# Patient Record
Sex: Male | Born: 1990 | Race: White | Hispanic: No | Smoking: Current every day smoker
Health system: Southern US, Community
[De-identification: ages and names within clinical notes are randomized; demographics above are authoritative.]

## PROBLEM LIST (undated history)

## (undated) DIAGNOSIS — I10 Essential (primary) hypertension: Secondary | ICD-10-CM

## (undated) DIAGNOSIS — M1990 Unspecified osteoarthritis, unspecified site: Secondary | ICD-10-CM

## (undated) DIAGNOSIS — K219 Gastro-esophageal reflux disease without esophagitis: Secondary | ICD-10-CM

## (undated) DIAGNOSIS — A4901 Methicillin susceptible Staphylococcus aureus infection, unspecified site: Secondary | ICD-10-CM

## (undated) DIAGNOSIS — F209 Schizophrenia, unspecified: Secondary | ICD-10-CM

## (undated) DIAGNOSIS — F32A Depression, unspecified: Secondary | ICD-10-CM

## (undated) DIAGNOSIS — R000 Tachycardia, unspecified: Secondary | ICD-10-CM

## (undated) DIAGNOSIS — D649 Anemia, unspecified: Secondary | ICD-10-CM

## (undated) DIAGNOSIS — J45909 Unspecified asthma, uncomplicated: Secondary | ICD-10-CM

## (undated) DIAGNOSIS — F1990 Other psychoactive substance use, unspecified, uncomplicated: Secondary | ICD-10-CM

## (undated) DIAGNOSIS — F319 Bipolar disorder, unspecified: Secondary | ICD-10-CM

## (undated) DIAGNOSIS — F419 Anxiety disorder, unspecified: Secondary | ICD-10-CM

## (undated) DIAGNOSIS — F4310 Post-traumatic stress disorder, unspecified: Secondary | ICD-10-CM

## (undated) DIAGNOSIS — F329 Major depressive disorder, single episode, unspecified: Secondary | ICD-10-CM

## (undated) HISTORY — DX: Essential (primary) hypertension: I10

## (undated) HISTORY — PX: APPENDECTOMY: SHX54

## (undated) HISTORY — PX: COLONOSCOPY: SHX174

---
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
## 2008-09-13 DIAGNOSIS — K559 Vascular disorder of intestine, unspecified: Secondary | ICD-10-CM

## 2008-09-13 HISTORY — DX: Vascular disorder of intestine, unspecified: K55.9

## 2008-10-01 ENCOUNTER — Emergency Department (HOSPITAL_COMMUNITY): Admission: EM | Admit: 2008-10-01 | Discharge: 2008-10-01 | Payer: Self-pay | Admitting: Emergency Medicine

## 2008-10-02 ENCOUNTER — Inpatient Hospital Stay (HOSPITAL_COMMUNITY): Admission: EM | Admit: 2008-10-02 | Discharge: 2008-10-06 | Payer: Self-pay | Admitting: Emergency Medicine

## 2008-10-02 IMAGING — CT CT PELVIS W/ CM
2 of 4 series · 17 of 46 positions shown, 19 images · IV contrast (agent unspecified)
Comparison: None.

CT ABDOMEN

CLINICAL DATA: Generalized abdominal pain for 4 days.

CT ABDOMEN AND PELVIS WITH CONTRAST
TECHNIQUE: Multidetector CT imaging of the abdomen and pelvis was
performed using the standard protocol following bolus
administration of intravenous contrast.
Contrast: 100 ml [FZ].

[Series 2: rtn ap with st · axial · 0.78mm/px · z∈[-504,-68]mm · 14 of 95 slices shown, 16 images]
[im 4/95  soft-tissue]
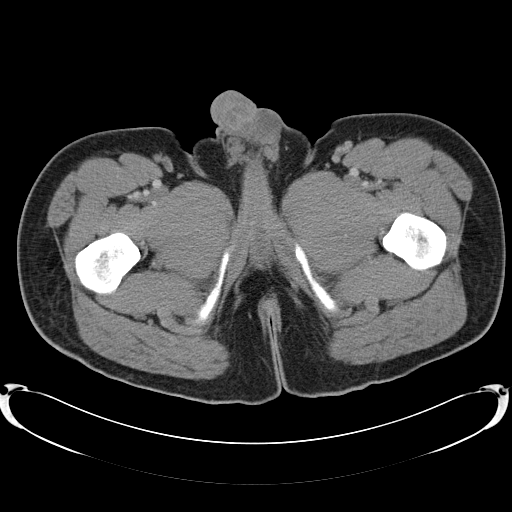
[im 4/95  bone]
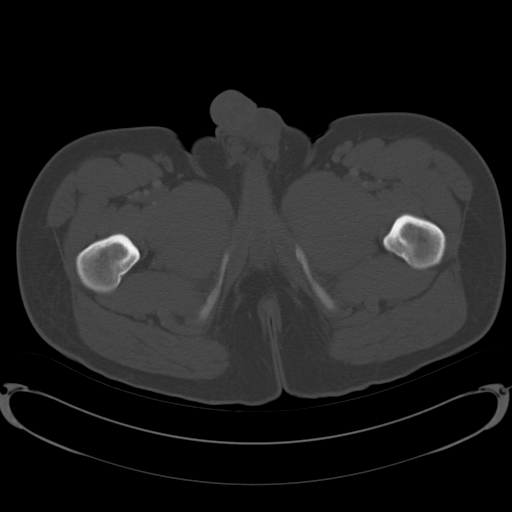
[im 12/95  soft-tissue]
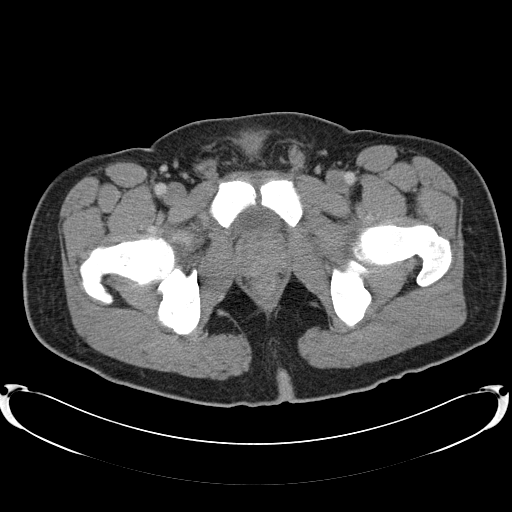
[im 19/95  soft-tissue]
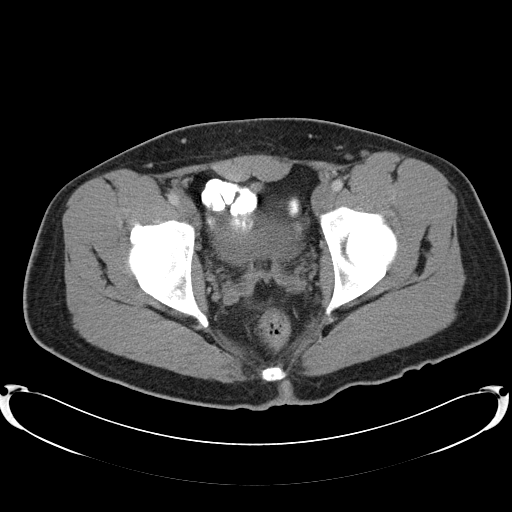
[im 27/95  soft-tissue]
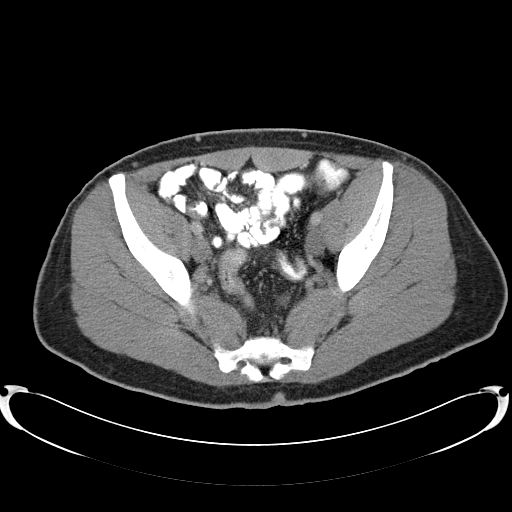
[im 31/95  soft-tissue]
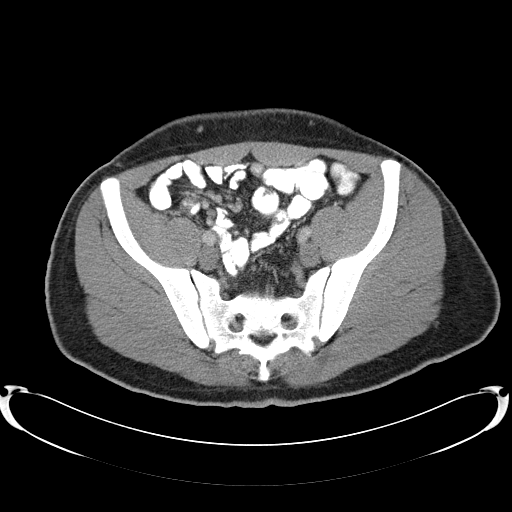
[im 38/95  soft-tissue]
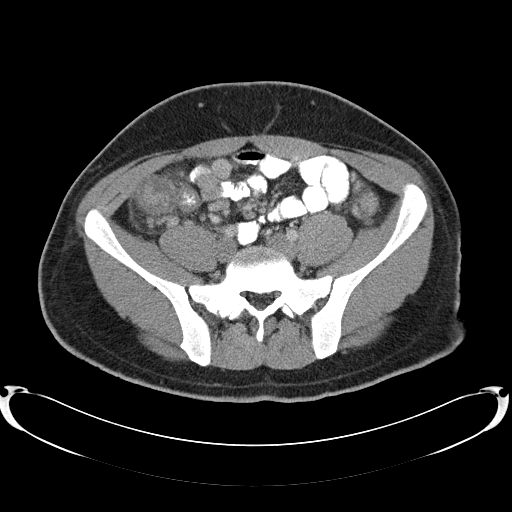
[im 46/95  soft-tissue]
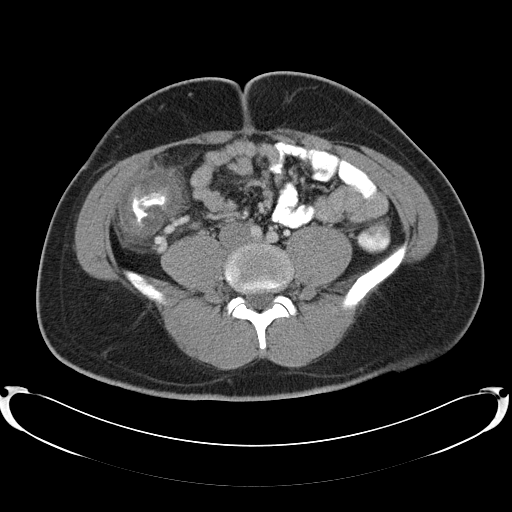
[im 49/95  soft-tissue]
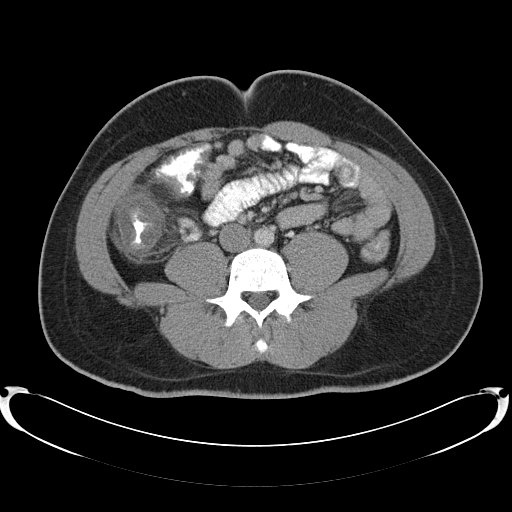
[im 57/95  soft-tissue]
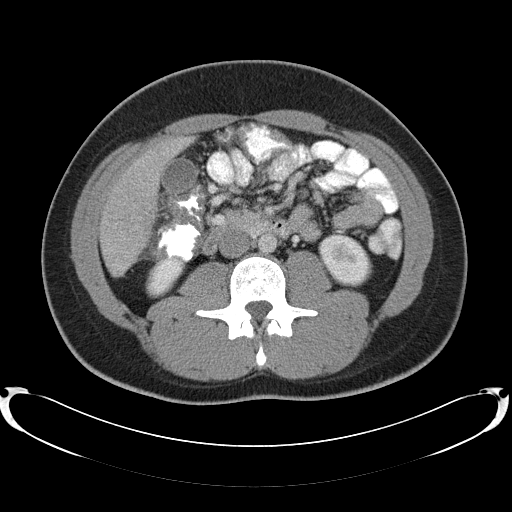
[im 57/95  bone]
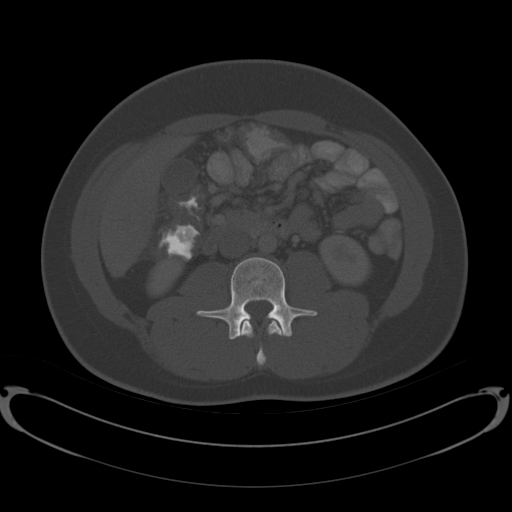
[im 64/95  soft-tissue]
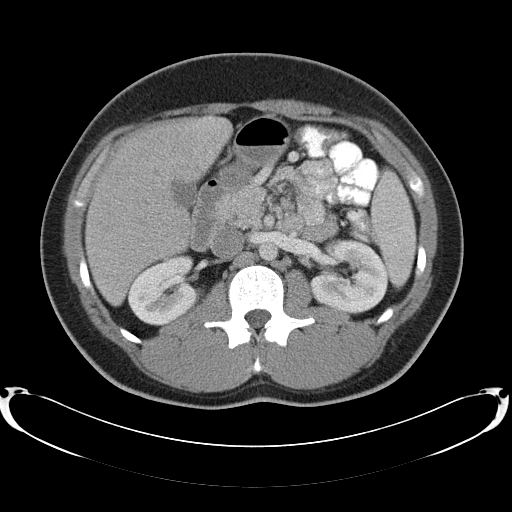
[im 72/95  soft-tissue]
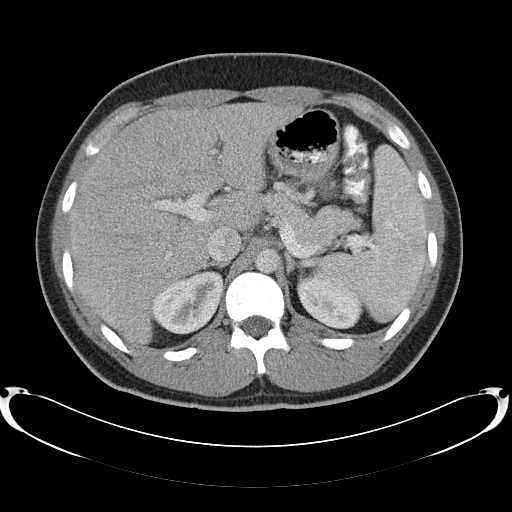
[im 76/95  soft-tissue]
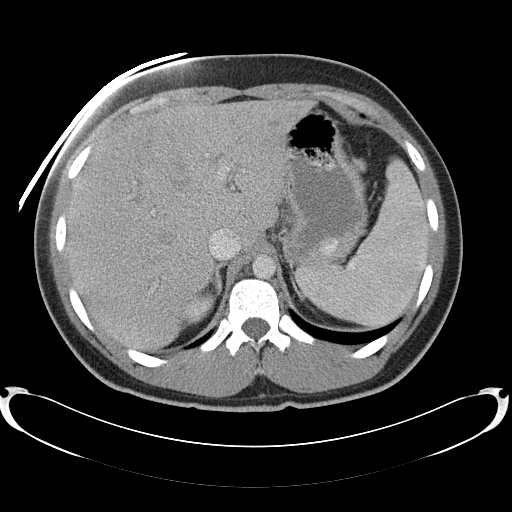
[im 83/95  soft-tissue]
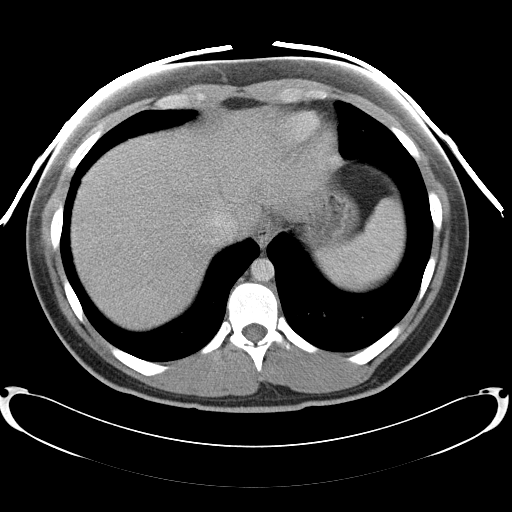
[im 91/95  soft-tissue]
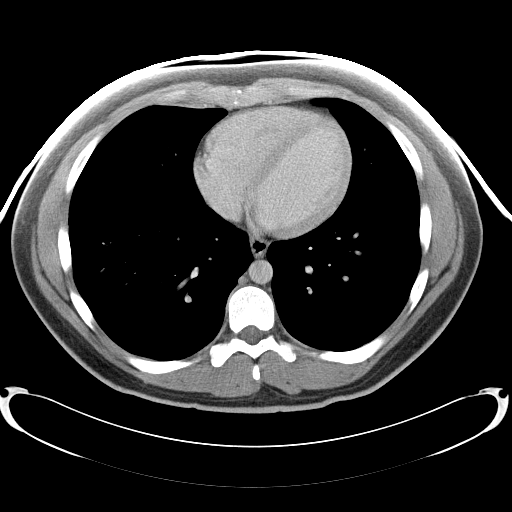

[Series 602: coronal iamges · coronal · 0.96mm/px · 3 of 82 slices shown]
[im 28/82  soft-tissue]
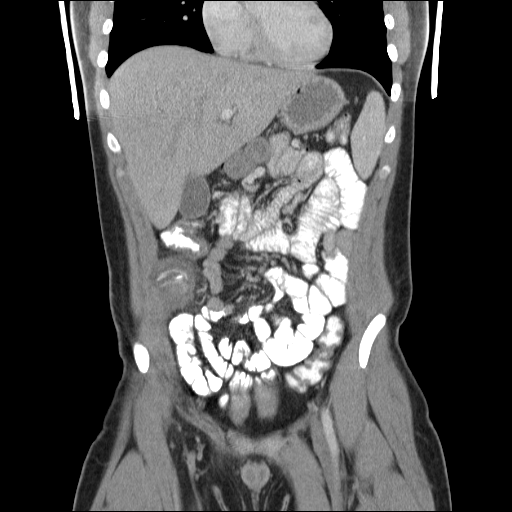
[im 37/82  soft-tissue]
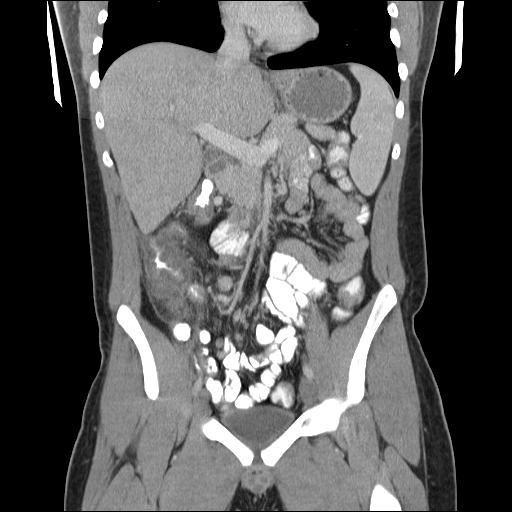
[im 46/82  soft-tissue]
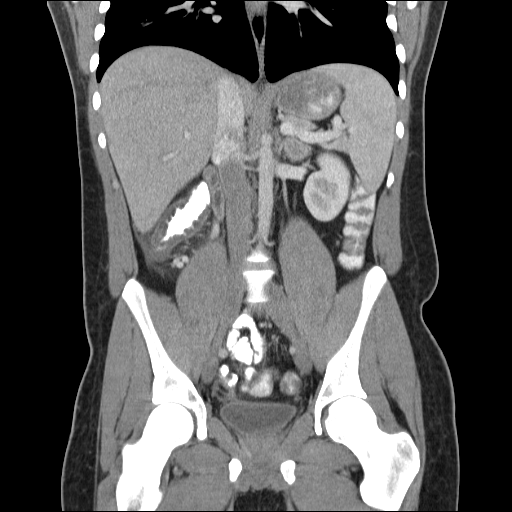

[17 of 46 positions shown; findings below may reference images not displayed]

FINDINGS: Mild dependent atelectasis at the lung bases.  Mild
periportal edema is present.  IVC is distended secondary fluid
resuscitation.  Kidneys appear normal.  Spleen is unremarkable.
Pancreas and stomach are normal.  Small bowel appears within normal
limits in the abdomen.
IMPRESSION: No acute abdominal abnormality.  Periportal edema secondary to
volume overload and resuscitation changes.

CT PELVIS
FINDINGS: Urinary bladder appears within normal limits.  The
rectum and sigmoid are normal.  There is some mild colonic
thickening at the transverse colon with profound colonic mural
thickening in the ascending colon.  There is some terminal ileal
mural thickening as well consistent with backwash ileitis.  Mural
thickness of the ascending colon measures up to 1.6 cm.  Small
reactive lymph nodes are present and there are dilated portal
venous collaterals in the right lower quadrant.  The splenic vein
and portal vein appear within normal limits.  The superior
mesenteric vein is essentially nonexistent, presumably chronically
thrombosed or absent, which would account for the numerous
collaterals in the abdomen.
IMPRESSION: 1.  Severe ascending colitis with backwash ileitis.  The
inflammation taper through the transverse colon. Differential
considerations include inflammatory bowel disease, infection, or
ischemia.  Based on the abnormal venous drainage, venous congestion
and remains a possibility.
2.  Absence of the superior mesenteric vein , presumably
representing chronic thrombosis or congenital absent.

## 2008-10-05 ENCOUNTER — Ambulatory Visit: Payer: Self-pay | Admitting: Oncology

## 2008-10-05 ENCOUNTER — Encounter (INDEPENDENT_AMBULATORY_CARE_PROVIDER_SITE_OTHER): Payer: Self-pay | Admitting: Gastroenterology

## 2010-03-28 ENCOUNTER — Emergency Department (HOSPITAL_COMMUNITY)
Admission: EM | Admit: 2010-03-28 | Discharge: 2010-03-29 | Disposition: A | Discharge status: Home / Self Care | Payer: Self-pay | Attending: Emergency Medicine | Admitting: Emergency Medicine

## 2010-03-28 DIAGNOSIS — F329 Major depressive disorder, single episode, unspecified: Secondary | ICD-10-CM | POA: Insufficient documentation

## 2010-03-28 DIAGNOSIS — T483X4A Poisoning by antitussives, undetermined, initial encounter: Secondary | ICD-10-CM | POA: Insufficient documentation

## 2010-03-28 DIAGNOSIS — F3289 Other specified depressive episodes: Secondary | ICD-10-CM | POA: Insufficient documentation

## 2010-03-29 LAB — COMPREHENSIVE METABOLIC PANEL
ALT: 22 U/L (ref 0–53)
Albumin: 4.3 g/dL (ref 3.5–5.2)
Alkaline Phosphatase: 91 U/L (ref 39–117)
Chloride: 104 mEq/L (ref 96–112)
Glucose, Bld: 113 mg/dL — ABNORMAL HIGH (ref 70–99)
Potassium: 3.6 mEq/L (ref 3.5–5.1)
Sodium: 138 mEq/L (ref 135–145)
Total Bilirubin: 0.7 mg/dL (ref 0.3–1.2)
Total Protein: 7.3 g/dL (ref 6.0–8.3)

## 2010-03-29 LAB — RAPID URINE DRUG SCREEN, HOSP PERFORMED
Benzodiazepines: NOT DETECTED
Cocaine: NOT DETECTED
Tetrahydrocannabinol: NOT DETECTED

## 2010-03-29 LAB — DIFFERENTIAL
Eosinophils Absolute: 0 10*3/uL (ref 0.0–0.7)
Lymphocytes Relative: 12 % (ref 12–46)
Lymphs Abs: 2.1 10*3/uL (ref 0.7–4.0)
Monocytes Relative: 6 % (ref 3–12)
Neutro Abs: 14.4 10*3/uL — ABNORMAL HIGH (ref 1.7–7.7)
Neutrophils Relative %: 82 % — ABNORMAL HIGH (ref 43–77)

## 2010-03-29 LAB — ETHANOL: Alcohol, Ethyl (B): <5 mg/dL (ref 0–10)

## 2010-03-29 LAB — CBC
Hemoglobin: 16.6 g/dL (ref 13.0–17.0)
MCV: 84.2 fL (ref 78.0–100.0)
Platelets: 255 10*3/uL (ref 150–400)
RBC: 5.51 MIL/uL (ref 4.22–5.81)
WBC: 17.5 10*3/uL — ABNORMAL HIGH (ref 4.0–10.5)

## 2010-05-21 LAB — COMPREHENSIVE METABOLIC PANEL
ALT: 14 U/L (ref 0–53)
AST: 21 U/L (ref 0–37)
AST: 16 U/L (ref 0–37)
Albumin: 3.7 g/dL (ref 3.5–5.2)
Albumin: 3.2 g/dL — ABNORMAL LOW (ref 3.5–5.2)
Alkaline Phosphatase: 72 U/L (ref 39–117)
Alkaline Phosphatase: 58 U/L (ref 39–117)
BUN: 8 mg/dL (ref 6–23)
Chloride: 107 mEq/L (ref 96–112)
Chloride: 105 mEq/L (ref 96–112)
Chloride: 109 mEq/L (ref 96–112)
Creatinine, Ser: 0.94 mg/dL (ref 0.4–1.5)
GFR calc Af Amer: >60 mL/min (ref >60)
GFR calc Af Amer: >60 mL/min (ref >60)
GFR calc non Af Amer: >60 mL/min (ref >60)
Glucose, Bld: 84 mg/dL (ref 70–99)
Potassium: 3.6 mEq/L (ref 3.5–5.1)
Potassium: 3.3 mEq/L — ABNORMAL LOW (ref 3.5–5.1)
Sodium: 140 mEq/L (ref 135–145)
Total Bilirubin: 0.4 mg/dL (ref 0.3–1.2)
Total Bilirubin: 0.4 mg/dL (ref 0.3–1.2)
Total Bilirubin: 0.4 mg/dL (ref 0.3–1.2)
Total Protein: 5.6 g/dL — ABNORMAL LOW (ref 6.0–8.3)

## 2010-05-21 LAB — CBC
HCT: 36.6 % — ABNORMAL LOW (ref 39.0–52.0)
HCT: 39.9 % (ref 39.0–52.0)
Hemoglobin: 12.7 g/dL — ABNORMAL LOW (ref 13.0–17.0)
MCHC: 34.9 g/dL (ref 30.0–36.0)
MCHC: 35.1 g/dL (ref 30.0–36.0)
MCV: 83.6 fL (ref 78.0–100.0)
Platelets: 210 10*3/uL (ref 150–400)
Platelets: 182 10*3/uL (ref 150–400)
Platelets: 249 10*3/uL (ref 150–400)
RBC: 4.18 MIL/uL — ABNORMAL LOW (ref 4.22–5.81)
RDW: 12.3 % (ref 11.5–15.5)
RDW: 12.7 % (ref 11.5–15.5)
WBC: 6.7 10*3/uL (ref 4.0–10.5)
WBC: 7.7 10*3/uL (ref 4.0–10.5)

## 2010-05-21 LAB — LUPUS ANTICOAGULANT PANEL: PTT Lupus Anticoagulant: 47 secs (ref 36.3–48.8)

## 2010-05-21 LAB — URINALYSIS, ROUTINE W REFLEX MICROSCOPIC
Protein, ur: NEGATIVE mg/dL
Urobilinogen, UA: 0.2 mg/dL (ref 0.0–1.0)

## 2010-05-21 LAB — CLOSTRIDIUM DIFFICILE EIA: C difficile Toxins A+B, EIA: NEGATIVE

## 2010-05-21 LAB — PROTEIN C, TOTAL: Protein C, Total: 62 % — ABNORMAL LOW (ref 70–140)

## 2010-05-21 LAB — DIFFERENTIAL
Basophils Absolute: 0 10*3/uL (ref 0.0–0.1)
Basophils Absolute: 0 10*3/uL (ref 0.0–0.1)
Basophils Absolute: 0.1 10*3/uL (ref 0.0–0.1)
Basophils Relative: 0 % (ref 0–1)
Basophils Relative: 0 % (ref 0–1)
Eosinophils Absolute: 0.2 10*3/uL (ref 0.0–0.7)
Eosinophils Relative: 2 % (ref 0–5)
Eosinophils Relative: 3 % (ref 0–5)
Lymphocytes Relative: 20 % (ref 12–46)
Monocytes Absolute: 0.9 10*3/uL (ref 0.1–1.0)
Neutro Abs: 5.7 10*3/uL (ref 1.7–7.7)
Neutrophils Relative %: 68 % (ref 43–77)

## 2010-05-21 LAB — LIPASE, BLOOD: Lipase: 10 U/L — ABNORMAL LOW (ref 11–59)

## 2010-05-21 LAB — BASIC METABOLIC PANEL
BUN: 2 mg/dL — ABNORMAL LOW (ref 6–23)
Calcium: 8.6 mg/dL (ref 8.4–10.5)
Creatinine, Ser: 0.88 mg/dL (ref 0.4–1.5)
GFR calc Af Amer: >60 mL/min (ref >60)

## 2010-05-21 LAB — ANTITHROMBIN III: AntiThromb III Func: 99 % (ref 76–126)

## 2010-05-21 LAB — STOOL CULTURE

## 2010-05-21 LAB — BETA-2-GLYCOPROTEIN I ABS, IGG/M/A
Beta-2 Glyco I IgG: <20 U/mL
Beta-2-Glycoprotein I IgM: <10

## 2010-05-21 LAB — JAK2 GENOTYPR: JAK2 GenotypR: NOT DETECTED

## 2010-05-21 LAB — PROTEIN S, TOTAL: Protein S Ag, Total: 108 % (ref 70–140)

## 2010-05-21 LAB — ETHANOL: Alcohol, Ethyl (B): <5 mg/dL (ref 0–10)

## 2010-05-21 LAB — CARDIOLIPIN ANTIBODIES, IGG, IGM, IGA: Anticardiolipin IgM: <10 not reported — ABNORMAL LOW (ref >10)

## 2010-05-21 LAB — OVA AND PARASITE EXAMINATION

## 2010-05-21 LAB — LACTIC ACID, PLASMA: Lactic Acid, Venous: 0.4 mmol/L — ABNORMAL LOW (ref 0.5–2.2)

## 2010-06-28 NOTE — Op Note (Signed)
NAME:  Herbert Walsh, Herbert Walsh NO.:  [id number]   MEDICAL RECORD NO.:  [id number]          PATIENT TYPE:  INP   LOCATION:  1521                         FACILITY:  [facility]   PHYSICIAN:  Bernette Redbird, M.D.   DATE OF BIRTH:  Feb 10, 1991   DATE OF PROCEDURE:  10/05/2008  DATE OF DISCHARGE:                               OPERATIVE REPORT   PROCEDURE:  Colonoscopy with biopsies.   INDICATIONS:  This is a 20 year old unassigned patient from [place]  who presented to the hospital several days ago with a 4-day history of  diarrhea, which became bloody and was associated with significant  abdominal pain; and with a CT scan of the abdomen and pelvis showing  proximal colitis and ileitis.  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  The patient has improved gradually over the  past several days in the hospital, and this test is being done to assess  the status of the colonic mucosa and to help confirm the current  clinical impression of ischemic colitis.   FINDINGS:  Essentially normal exam, minimal mucosal edema in the  proximal colon; has an apparent vestige of his resolving colitis.   PROCEDURE:  The nature, purpose and risks of the procedure had been  discussed with the patient, who provided written consent and was brought  from his hospital room to the [facility] Unit.  Sedation was  Phenergan 25 mg, fentanyl 100 mcg and Versed 12 mg IV prior to and  during the course of the procedure.  Unfortunately, the patient was  somewhat combative during advancement of the scope, despite using the  above medication, and it took several people to help restrain him.  However, we were ultimately able to reach the cecum with the help of  some  external abdominal compression to control looping of the scope, and  the terminal ileum was entered for a short distance.  The mucosa  appeared a little bit edematous, but not erythematous or in any way  ulcerated or nodular.  Several biopsies were obtained.  Pullback was  then performed around the colon.   The patient had been on MiraLax recently while in the hospital.  It was  stopped yesterday.  No additional prep was administered for this  procedure and there was just a little bit of mucoid stool film coating  part of the colonic wall; not enough, in my opinion, to have obscured  any significant lesions.  Overall, the quality of the prep was quite  good.   In the cecum and more so in the proximal ascending colon, there was some  degree of mucosal edema, with loss of vascularity and slight bumpiness  to the mucosa -- consistent with edema.  Multiple biopsies were obtained  from that area.   The remainder of the colon was normal.  No polyps, cancer overt colitis  or diverticular change was noted.  I did not retroflex in the rectum,  but careful antegrade inspection showed a small rectal ampulla, but no  other abnormalities.   IMPRESSION:  1. No bleeding or blood in the colonic lumen at the time of this exam.  2. Virtually complete resolution of his proximal colitis; with now the      only vestige being mild mucosal edema, characterized by loss of      vascularity, a slightly edematous appearance to the mucosa, and      some mild pink erythema.  No ulcerations, granularity, or exudate      present.   PLAN:  Await pathology on today's biopsies to see if they  retrospectively can help clarify the etiology of his colitis, which I  still think was probably on an ischemic basis.  We will  stop his  antibiotics, advance his diet, and change him to oral pain medications.           ______________________________  Bernette Redbird, M.D.     RB/MEDQ  D:  10/05/2008  T:  10/05/2008   Job:  [id number]

## 2010-06-28 NOTE — Consult Note (Signed)
NAME:  Herbert Walsh, COURT NO.:  [id number]   MEDICAL RECORD NO.:  [id number]          PATIENT TYPE:  INP   LOCATION:  1521                         FACILITY:  [facility]   PHYSICIAN:  Dung C. Madilyn Fireman, M.D.    DATE OF BIRTH:  September 13, 1990   DATE OF CONSULTATION:  10/02/2008  DATE OF DISCHARGE:                                 CONSULTATION   REASON FOR CONSULTATION:  Bloody diarrhea and lower abdominal pain.   HISTORY OF ILLNESS:  The patient is a 20 year old white male who  presents with initially abrupt onset of watery diarrhea with gradually  increasing lower abdominal pain and becoming frankly bloody after 2-3  days.  He presented to [facility] yesterday and was transferred  here for admission.  He reportedly had some stool studies at [facility]  and the only result in the computer is a C difficile toxin which was  negative.  I am not sure what else was drawn.  At this hospital he had a  CT scan which showed mild colonic thickening of transverse colon with  profound colonic mural thickening in the ascending colon with some  terminal ileal thickening, as well, also the superior mesenteric vein  appeared absent with a lot of collaterals.  He was started on Cipro.  He  had a normal white blood cell count.  He has been requiring pain  medicine frequently.   PAST MEDICAL HISTORY:  Essentially unremarkable, except for appendicitis  with surgery 3-4 years ago.   MEDICATIONS:  None.   ALLERGIES:  None.   SOCIAL HISTORY:  The patient smokes cigarettes and denies alcohol or  drug use.   FAMILY HISTORY:  Basically noncontributory.   PHYSICAL EXAM:  GENERAL:  Somewhat somnolent, well-developed, well-  nourished white male in no acute stress.  HEART:  Regular rate and rhythm without murmur.  LUNGS:  Clear.  ABDOMEN:  Soft, nondistended with normoactive bowel sounds, no  hepatosplenomegaly or mass.  There is tenderness in the right lower  quadrant with rebound  tenderness in the right lower quadrant, as well.   IMPRESSION:  Acute enterocolitis of the right colon and possibly  involving the terminal ileum.  Differential diagnosis includes  infectious colitis, ischemia, especially given the superior mesenteric  vein anomaly and possibility of inflammatory bowel disease.   PLAN:  We will continue Cipro and Flagyl, await stool studies have a low  threshold for colonoscopy and will gently start bowel prep with MiraLax  8 ounces every 4 hours while awake.           ______________________________  Everardo All. Madilyn Fireman, M.D.     JCH/MEDQ  D:  10/02/2008  T:  10/02/2008  Job:  [id number]

## 2010-06-28 NOTE — H&P (Signed)
NAME:  ROWE, WARMAN NO.:  [id number]   MEDICAL RECORD NO.:  [id number]          PATIENT TYPE:  IPS   LOCATION:  0400                          FACILITY:  [facility]   PHYSICIAN:  Anselm Jungling, MD  DATE OF BIRTH:  26-Apr-1990   DATE OF ADMISSION:  09/05/2007  DATE OF DISCHARGE:                       PSYCHIATRIC ADMISSION ASSESSMENT   PATIENT IDENTIFICATION:  A 20 year old male involuntary committed on  September 05, 2007.   HISTORY OF PRESENT ILLNESS:  The patient is here on petition.  Petition  papers are unavailable to review.  The patient is here because of an  overdose on 40 DMZ pills and drinking Robitussin.  He apparently started  vomiting at [facility] and was transported to the emergency department.  The patient reports he has been using this medication for some time to  trip, denies that this was on a suicide attempt.  He states that he  wants to go the [employer], very unhappy, has been in the [employer] for  about a year and a half.  He states his parents have nothing to do with  him.  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  He denies  any other drug use.  Denies any alcohol use.  Again denies any suicidal  thoughts.   PAST PSYCHIATRIC HISTORY:  First admission to [facility].  No current outpatient mental health treatment.   SOCIAL HISTORY:  A 20 year old male, single, currently living in  [place].  He has been in the [employer] for 1-1/2 years and is  planning to get a dishonorable discharge.   FAMILY HISTORY:  Unknown.   ALCOHOL AND DRUG HABITS:  Patient smokes.  Again, has been using  dextromethorphan.  Denies any other drug use.   PRIMARY CARE:  None.   MEDICAL PROBLEMS:  None.   MEDICATIONS:  None.   DRUG ALLERGIES:  No known allergies.   PHYSICAL EXAMINATION:  GENERAL:  Initially, the patient was very sleepy,  but patient aroused with a second attempt a few hours later and was  agreeable to interview.  He was  healthy appearing.  He is in no acute  distress.  He was fully assessed at [facility]  Initially, he  was uncooperative.  HEENT:  Pupils were dilated.  Physical exam was reviewed with no  significant findings.  VITAL SIGNS:  Temperature of 98, 129 heart rate, 20 respirations, blood  pressure was 156/99, 6 feet 2 inches tall, 77 kg.  Urine drug screen was  positive for THC.  CBC within normal limits.  C-met within normal  limits.  Urinalysis is negative.  Acetaminophen level less than 10.  Salicylate less than 4, alcohol level less than 5.   MENTAL STATUS EXAM:  Again, the patient is fully awake, good eye  contact.  Currently dressed in paper scrubs.  Speech is clear, normal  pace and tone.  The patient's mood is neutral.  The patient's affect is  pleasant and polite.  Thought processes are coherent.  No evidence of  any psychotic thinking.  No delusional thinking.  Denies any suicidal  thoughts.  Cognitive functioning:  Memory is good.  Judgment is fair.  Insight is poor at this time in regards to substance use.   DIAGNOSES:  AXIS I:  Adjustment disorder, polysubstance abuse.  AXIS II:  Deferred.  AXIS III:  No known medical conditions.  AXIS IV:  Problems with occupation, relating to the [employer].  AXIS V:  Current is 35-40.   PLANS:  Contract for safety.  Establish mood thinking.  The patient will  be encouraged to attend the red group.  Will have a family session with  father for concerns and support.  Continue to assess for comorbidities.  His tentative length of stay is 2-3 days.      Landry Corporal, N.P.      Anselm Jungling, MD  Electronically Signed    JO/MEDQ  D:  09/05/2007  T:  09/05/2007  Job:  [id number]

## 2010-06-28 NOTE — Consult Note (Signed)
NAME:  Herbert Walsh, Herbert Walsh NO.:  [id number]   MEDICAL RECORD NO.:  [id number]          PATIENT TYPE:  INP   LOCATION:  1521                         FACILITY:  [facility]   PHYSICIAN:  Valentino Hue. Magrinat, M.D.DATE OF BIRTH:  Jul 10, 1990   DATE OF CONSULTATION:  10/05/2008  DATE OF DISCHARGE:                                 CONSULTATION   Herbert Walsh is a 20 year old [place] man referred by Dr. Matthias Hughs for  evaluation of possible hypercoagulable state.   The patient presented this admission with a several day history of  worsening watery diarrhea proceeding to bloody diarrhea, accompanied by  cramps and temperature up to 100.  Upon admission a CT scan  of the  abdomen and pelvis was obtained which showed significant colonic  thickening in the ascending colon, less so in the transverse.  There  were some small lymph nodes which appeared reactive.  The splenic vein  and the portal vein appeared normal but the superior mesenteric vein was  not seen with significant collateralization suggestive of a chronic  thrombotic event.   The patient is now clinically somewhat improved.  He is heading for  colonoscopy today.  We are asked to evaluate him for a possible  hypercoagulable state.   PAST MEDICAL HISTORY:  1. Significant for an adjustment disorder with polysubstance abuse      July of 2009.  2. There is a history of tobacco abuse.  3. The patient is status post a complex appendectomy several years ago      in [place] with perforation requiring intraperitoneal surgery.   FAMILY HISTORY:  The patient's father is alive at age 47.  He is in Set designer but is disabled currently.  The patient's mother, Kamari Buch  works in our ICU in Merchandiser, retail support.  The patient is an only child.  As  far as the family history of clotting or bleeding problems is concerned,  there are none in the immediate family.  However, the patient's maternal  great-grandfather had deep vein  thrombosis problems and his maternal  great-grandmother (that is the patient's mother's grandmother) had  venous stripping all the way to her heart.  One of the patient's  grandmothers had a stroke at the age of 66 and the patient's father's  father died at the age of 16 on the operating table while undergoing  amputation in the setting of uncontrolled diabetes.   ALLERGIES:  No known drug allergies.   MEDICATIONS:  The patient was on no medications at home.   SOCIAL HISTORY:  The patient is unemployed.  He was in the [employer]  apparently at some point.  The patient's girlfriend Amy is present today  as is the patient's mother, Alvis Lemmings and the patient's grandmother, Jan  (that is Dawn's mother).   REVIEW OF SYSTEMS:  He tells me that his abdominal pain is now better  but he is still having very frequent episodes of diarrhea.  In fact he  was in the bathroom when I came in and went back to the bathroom shortly  after we talked before  going downstairs for his colonoscopy.  He denies  unusual headaches, visual changes, jaundice, difficulty swallowing, pain  on swallowing, shortness of breath, cough, pleurisy, hemoptysis or any  problems with the urine including hematuria.  He is not aware of any  prior clotting or bleeding problems.   PHYSICAL EXAM:  He is a young white male who is 6 feet 1 inch tall and  weighs 84 kg.  Temperature is 98.0, pulse 86, respiratory rate 18 and  blood pressure 102/59.  His room air saturation is 97%.  Sclerae were  nonicteric.  I did not palpate any cervical, supraclavicular or axillary  adenopathy.  LUNGS:  No crackles or wheezes.  HEART:  Regular rate and rhythm.  No murmur appreciated.  ABDOMEN:  Was soft.  Bowel sounds were positive, although apparently  somewhat diminished.  There was no tenderness to mild palpation in the  right upper quadrant.  MUSCULOSKELETAL EXAM:  No peripheral edema.  NEUROLOGIC EXAM:  Was nonfocal.   LABORATORY WORK:  Showed a  sodium of 140, potassium 4.0, chloride 105,  CO2 of 30, glucose 85, BUN 2, creatinine 0.94, total bilirubin 0.4,  alkaline phosphatase 58, AST 16, ALT 11, total protein 5.9, albumin 3.2  and calcium 8.6.  CBC on August 21 showed a white cell count of 7.7,  hemoglobin 12.4 and platelets 182,000.  Lactic acid August 21 was 0.4,  lipase August 20 was 10.  Urinalysis August 20 was negative.  C.  difficile toxin on August 19 was negative as were ova and parasites.  Culture remains negative for Salmonella, Shigella, Campylobacter or  Yersinia.  Alcohol on August 20 was less 0.5 mg/dL.   FILMS:  CTs of the abdomen and pelvis has already been discussed.   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   Likely, the superior mesenteric artery problem is due to the prior  abdominal surgery and peritonitis.  However, we have a hypercoagulable  panel pending and I will add an JAK-2 test  to that.  These tests are  likely to take a week or so before they come back and the patient may  well be discharged by the time I receive those results.  If they are  normal I will send him a letter, but if they are not I will make sure he  has followup through my office.   The patient's mother requested a mild tranquilizer and I have written  him for 0.25 Ativan t.i.d. p.r.n.  Any further tranquilizers or other  psychotropics of course will be through the patient's hospitalist team.  The patient's mother  also requested a psychiatric evaluation (see the  psychiatric note from  Dr. Electa Sniff July of 2009 when the patient was diagnosed as having an  adjustment disorder with polysubstance abuse).  However, the patient  refused psychiatric evaluation at this time      Valentino Hue. Magrinat, M.D.  Electronically Signed     GCM/MEDQ  D:  10/05/2008  T:  10/05/2008  Job:  [id number]   cc:   Bernette Redbird, M.D.  Fax:  [phone]   Lorne Skeens. Hoxworth, M.D.  [address]  [place]  [place] [address]   Ladell Pier, M.D.

## 2010-06-28 NOTE — H&P (Signed)
NAME:  Herbert Walsh, Herbert Walsh NO.:  [id number]   MEDICAL RECORD NO.:  [id number]          PATIENT TYPE:  INP   LOCATION:  0101                         FACILITY:  [facility]   PHYSICIAN:  Vania Rea, M.D. DATE OF BIRTH:  February 27, 1990   DATE OF ADMISSION:  10/02/2008  DATE OF DISCHARGE:                              HISTORY & PHYSICAL   PRIMARY CARE PHYSICIAN:  Unassigned.   CHIEF COMPLAINT:  Diarrhea for 4 days.   HISTORY OF PRESENT ILLNESS:  This is a 20 year old Caucasian gentleman  with no significant past medical history other than previous suicidal  gesture about a year ago who presents with cramping abdominal pains and  diarrhea for the past 4 days.  The patient says he is passing stool  about 25 times per day and for the past 36 hours has become bloody.  Around midday yesterday the patient presented to [facility] emergency  room with the above symptoms, was observed in the emergency room given  IV fluids, blood work was done.  He was noted to have only one loose  stool while at [facility] and was discharged home with plans to follow  up on the lab work that had been done on his stool.  On arrival at home,  the patient was dissatisfied because he had blood in his stool and  called his mother who is an ICU nurse and she brought him to [facility]  where he was reevaluated.  Again his labs were normal, but this time a  CAT scan of his abdomen and pelvis was done and it revealed colitis and  ileitis, and the hospitalist service was called to assist in management.   The patient has not been taking his temperature but says he has been  having fever and chills along with a cramping abdominal pains and fever  is relieved by Tylenol.  He has had no sick contacts and he has had no  similar episode.  He has not been taking any antibiotics.  His mother  does not live with him, though she works in the ICU, she has not seen  him for about 3 weeks.   PAST MEDICAL HISTORY:   Other than noted above, negative.   MEDICATIONS:  None.   ALLERGIES:  NO KNOWN DRUG ALLERGIES.   SOCIAL HISTORY:  He smokes 6-7 cigarettes per day and says he would  smoke two packs if he could afford it.  Denies alcohol or illicit drug  use.  He is unemployed.   FAMILY HISTORY:  Significant for diabetes and depression in his mother.  Hypertension and hyperlipidemia and depression in his father.   REVIEW OF SYSTEMS:  Other than noted above, a 10-point review of systems  is unremarkable.   PHYSICAL EXAMINATION:  GENERAL:  A healthy-looking, well-nourished young  Caucasian gentleman lying in the stretcher.  He is somewhat distressed  by abdominal pain but is not ill-looking.  VITALS:  Temperature is 98.3, pulse 76, respirations 20, blood pressure  121/82.  He is saturating 100% on room air.  HEENT:  Pupils are round equal and  reactive.  Mucous membranes pink and  anicteric.  He is mildly dehydrated.  NECK:  No cervical lymphadenopathy or thyromegaly.  No jugular venous  distention.  No carotid bruits.  CHEST:  Clear to auscultation bilaterally.  CARDIOVASCULAR:  Regular rhythm without murmur.  ABDOMEN:  Soft, but he says it is very sore to touch, and there is  rebound tenderness with stethoscope.  His bowel sounds are somewhat  diminished.  There is no abdominal distention.  There is right lower  quadrant scar from a previous ruptured appendectomy.  EXTREMITIES:  Without edema.  He has 3+ dorsalis pedis pulses  bilaterally.  CENTRAL NERVOUS SYSTEM:  Cranial nerves II-XII are grossly intact.  Has  no focal neurologic deficit.   LABORATORY DATA:  White count is 8.4, hemoglobin 12.7, platelets 185.  He has a normal differential.  His white count complete metabolic panel  significant for a sodium of 135, potassium 3.3, but otherwise  unremarkable.  BUN is 18, creatinine 0.87, total protein is 5.6 and  albumin is 3.1.  Liver functions otherwise normal.  Calcium is 8.4,  alcohol  level is undetectable.  His blood lipase is 10.   IMAGING STUDIES:  CT scan of the abdomen and pelvis with contrast shows  no acute abdominal abnormalities, periportal edema secondary to volume  overload and resuscitation measures.  In his pelvis, the urinary bladder  is normal.  The rectum and sigmoid are normal.  There is mild colonic  thickening of the transverse colon with profound colonic mural  thickening in the ascending colon.  There is some terminal ileal neural  thickening as well as consistent with backwash ileitis, mural thickening  in the ascending colon measures up to 1.6 cm, small reactive lymph nodes  are present and they are dilated.  Portal venous collaterals in the  right lower quadrant.  The splenic vein and portal vein appear within  normal limits.  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   ASSESSMENT:  1. Acute colitis and ileitis.  Differential includes new onset of      Crohn's disease versus C diff infection versus infectious      enteritis.  2. Hyponatremia due to ongoing diarrhea.  3. Tobacco abuse.   PLAN:  Will admit the patient for IV hydration and pain control will go  ahead and start him on oral Cipro and Flagyl as well as Probiotics.  Will consult with GI Service for assistance with management.      Vania Rea, M.D.  Electronically Signed     LC/MEDQ  D:  10/02/2008  T:  10/02/2008  Job:  [id number]   cc:   Samuel Jester, DO

## 2010-06-28 NOTE — Group Therapy Note (Signed)
NAME:  Herbert Walsh, Herbert Walsh NO.:  [id number]   MEDICAL RECORD NO.:  [id number]          PATIENT TYPE:  INP   LOCATION:  1521                         FACILITY:  [facility]   PHYSICIAN:  Ladell Pier, M.D.   DATE OF BIRTH:  [birth year]                                 PROGRESS NOTE   DISCHARGE DIAGNOSES:  1. Abdominal pain.  2. Bloody diarrhea.  3. Tobacco use.  4. Severe ascending colitis seen on CT scan of the abdomen and pelvis.   DISCHARGE MEDICATIONS:  To be determined.   PROCEDURES:  None.   CONSULTANTS:  General surgery, Lorne Skeens. Hoxworth, M.D.  GI, Bernette Redbird, M.D.   DIAGNOSTIC STUDIES:  CT scan of the abdomen and pelvis showed severe ascending colitis with  backwash ileitis.  The inflammation tapered through the transverse  colon.  Differential considerations include inflammatory bowel disease,  infection or ischemia.  Based on the abnormal venous drainage venous  congestion remains a possibility.  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   HISTORY OF PRESENT ILLNESS:  The patient is a 20 year old Caucasian gentleman with no significant  past medical history, other than previous suicidal gestures about a year  ago, who presents with cramping abdominal pain and diarrhea for the past  4 days.  The patient states that he was passing stool about 25 times per  day and for the past 36 hours it has become bloody.   Past medical history, family history, social history, meds, allergies,  review of systems per admission H and P.   PHYSICAL EXAMINATION:  Temperature 98, pulse of 86, respirations 18, blood pressure 102/59,  pulse oximetry 97% in room air.  GENERAL: The patient is lying in bed, does not seem to be in any acute  distress.  HEENT: Head is normocephalic, atraumatic.  Pupils reactive to light.  Throat without erythema.  CARDIOVASCULAR:  Regular rate and rhythm.  LUNGS: Clear  bilaterally.  ABDOMEN:  Positive bowel sounds. No guarding, no rebound.  EXTREMITIES: Without edema.   HOSPITAL COURSE:  Colitis:  The patient was admitted to the hospital, placed on IV fluids.  He was placed on a clear liquid diet.  GI and general surgery were both  consulted.  He is being medically managed for now.  He will possibly get  a colonoscopy today. He is on Flagyl and Cipro.  His pain as improved,  although he still does require some pain medication.  The patient, at  times, is very agitated and states that he is very hungry and wants to  eat.  On discussion with GI, probably will get a colonoscopy today.  No  other active issues.  Labs and medications will be dictated at time of  discharge.      Ladell Pier, M.D.  Electronically Signed     NJ/MEDQ  D:  10/05/2008  T:  10/05/2008  Job:  [id number]

## 2010-06-28 NOTE — Consult Note (Signed)
NAME:  MAY, OZMENT NO.:  [id number]   MEDICAL RECORD NO.:  [id number]          PATIENT TYPE:  INP   LOCATION:  1521                         FACILITY:  [facility]   PHYSICIAN:  Sharlet Salina T. Hoxworth, M.D.DATE OF BIRTH:  07/27/1990   DATE OF CONSULTATION:  10/03/2008  DATE OF DISCHARGE:                                 CONSULTATION   CHIEF COMPLAINT:  Abdominal pain, bloody diarrhea.   HISTORY:  I was asked by Dr. Matthias Hughs to evaluate Mr. Wedig.  He is a 20-  year-old white male who was in his usual state of good health until  approximately 6 days ago.  At that time, he noted initially the onset of  frequent watery diarrhea.  Over the next day or so, he then developed  fever which was then followed the following day by crampy lower  abdominal pain and frequent bloody diarrhea.  He presented to [facility] 3 days ago for evaluation and was treated with IV fluids and  stool studies were obtained.  He was discharged home.  However, due to  the persistent bloody diarrhea and crampy pain, he remain concerned and  presented to [facility] Emergency Room and was admitted here yesterday  on October 02, 2008.  Over that time, he has continued to have fairly  severe crampy lower abdominal pain, right greater than left.  No nausea  or vomiting.  He has continued to have frequent bloody diarrhea up until  admission, although he has had only a few stools since admission and has  not noted definite blood.  His crampy abdominal pain is about the same  as time of admission.  He denies any chronic GI complaints or abdominal  pain leading up to this illness.  No contacts with patients with similar  illness.  No travel.   PAST MEDICAL HISTORY:  Significant for open appendectomy for apparent  severe perforated gangrenous  appendicitis with an 8-day hospitalization  a number of years ago in [place].  Medical, no significant illness.   MEDICATIONS:  None.   ALLERGIES:   NONE.   SOCIAL HISTORY:  Unemployed.  Smokes half a pack of cigarettes per day.  Denies illicit drugs or alcohol.   FAMILY HISTORY:  Significant for diabetes and depression.   REVIEW OF SYSTEMS:  GENERAL:  Positive for malaise and fever as above.  RESPIRATORY:  No shortness of breath, cough, wheezing.  CARDIAC:  No  chest pain, palpitations.  ABDOMEN/GI:  As above.  GU:  No urinary  burning or frequency.   PHYSICAL EXAMINATION:  VITAL SIGNS:  He is afebrile, heart rate 73,  blood pressure 96/56, respirations 20.  GENERAL:  He is a well-developed, healthy-appearing male who is in no  acute distress.  SKIN:  Warm and dry.  No rash or infection.  HEENT:  No palpable mass or thyromegaly.  Sclerae nonicteric.  LYMPH:  No cervical, subclavicular or inguinal nodes palpable.  LUNGS:  Clear without wheezing or increased work of breathing.  CARDIAC:  Regular rate and rhythm.  No murmurs.  ABDOMEN:  There is  a well-healed right lower quadrant transverse  incision.  Bowel sounds are present, but somewhat hypoactive.  No  distention.  There is well localized significant right lower quadrant  and right mid abdominal tenderness with guarding.  No discernible masses  or organomegaly.  No definite peritoneal signs.  RECTAL:  Deferred.  EXTREMITIES:  No swelling or deformity.  NEUROLOGIC:  Alert, oriented.  Motor and sensory exam grossly normal.   LABORATORY DATA:  White count is 7.7 and has not been elevated,  hemoglobin 12.4, electrolytes are normal.  Lactic acid normal at 0.4.  C. diff toxin is negative x1.  Stool cultures are pending.  Fecal stool  Hemoccult is positive   DIAGNOSTICS:  CT scan of the abdomen and pelvis is reviewed.  This shows  striking thickening of the wall of the right colon gradually tapering to  normal across the transverse colon.  There is some thickening of the  terminal ileum as well.  No free air or free fluid.  The SMV appears  absent or occluded with collaterals in  the mesentery.   ASSESSMENT/PLAN:  A generally healthy 21 year old male with acute right-  sided colitis.  I suspect that this is infectious.  Ischemia or new  onset of inflammatory bowel disease remain in the differential.  Currently, the patient appears stable with well localized tenderness and  no evidence of toxicity or sepsis.  I see no indication for surgery at  present.  Some stool studies are still pending.  I recommend continued  antibiotics, bowel rest and observation at present.  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  With the chronic nature of the occlusion and  collateral formation, I would not think that this would be an etiology  for intestinal ischemia.  We will follow with you.      Lorne Skeens. Hoxworth, M.D.  Electronically Signed     BTH/MEDQ  D:  10/03/2008  T:  10/03/2008  Job:  [id number]

## 2010-07-04 ENCOUNTER — Emergency Department (HOSPITAL_COMMUNITY)
Admission: EM | Admit: 2010-07-04 | Discharge: 2010-07-05 | Disposition: A | Discharge status: Home / Self Care | Payer: Self-pay | Attending: Emergency Medicine | Admitting: Emergency Medicine

## 2010-07-04 DIAGNOSIS — F911 Conduct disorder, childhood-onset type: Secondary | ICD-10-CM | POA: Insufficient documentation

## 2010-07-04 LAB — DIFFERENTIAL
Basophils Absolute: 0 10*3/uL (ref 0.0–0.1)
Lymphs Abs: 1.7 10*3/uL (ref 0.7–4.0)
Monocytes Absolute: 0.7 10*3/uL (ref 0.1–1.0)

## 2010-07-04 LAB — URINE MICROSCOPIC-ADD ON

## 2010-07-04 LAB — CBC
Hemoglobin: 16 g/dL (ref 13.0–17.0)
MCH: 29.2 pg (ref 26.0–34.0)
MCHC: 33.5 g/dL (ref 30.0–36.0)
MCV: 87 fL (ref 78.0–100.0)
Platelets: 255 10*3/uL (ref 150–400)

## 2010-07-04 LAB — URINALYSIS, ROUTINE W REFLEX MICROSCOPIC
Bilirubin Urine: NEGATIVE
Glucose, UA: NEGATIVE mg/dL
Specific Gravity, Urine: 1.025 (ref 1.005–1.030)
Urobilinogen, UA: 0.2 mg/dL (ref 0.0–1.0)
pH: 6 (ref 5.0–8.0)

## 2010-07-04 LAB — BASIC METABOLIC PANEL
BUN: 8 mg/dL (ref 6–23)
CO2: 31 mEq/L (ref 19–32)
GFR calc non Af Amer: >60 mL/min (ref >60)
Glucose, Bld: 81 mg/dL (ref 70–99)
Potassium: 3.6 mEq/L (ref 3.5–5.1)

## 2010-07-04 LAB — RAPID URINE DRUG SCREEN, HOSP PERFORMED
Barbiturates: NOT DETECTED
Opiates: NOT DETECTED

## 2010-07-04 LAB — ACETAMINOPHEN LEVEL: Acetaminophen (Tylenol), Serum: <15 ug/mL (ref 10–30)

## 2010-11-11 LAB — CBC
HCT: 43.5
HCT: 47.1
Hemoglobin: 14.9
MCV: 83.9
Platelets: 222
Platelets: 230
RBC: 5.19
WBC: 12 — ABNORMAL HIGH
WBC: 9.2

## 2010-11-11 LAB — DIFFERENTIAL
Basophils Absolute: 0
Basophils Relative: 0
Eosinophils Relative: 0
Eosinophils Relative: 1
Lymphocytes Relative: 19
Lymphs Abs: 2.3
Monocytes Absolute: 0.4
Monocytes Absolute: 0.6
Monocytes Relative: 5

## 2010-11-11 LAB — RAPID URINE DRUG SCREEN, HOSP PERFORMED
Amphetamines: NOT DETECTED
Amphetamines: NOT DETECTED
Barbiturates: NOT DETECTED
Barbiturates: NOT DETECTED
Benzodiazepines: NOT DETECTED
Tetrahydrocannabinol: POSITIVE — AB
Tetrahydrocannabinol: POSITIVE — AB

## 2010-11-11 LAB — COMPREHENSIVE METABOLIC PANEL
AST: 21
Albumin: 4.2
Alkaline Phosphatase: 75
Chloride: 108
GFR calc Af Amer: >60
Potassium: 3.5
Total Bilirubin: 0.5

## 2010-11-11 LAB — URINALYSIS, ROUTINE W REFLEX MICROSCOPIC
Hgb urine dipstick: NEGATIVE
Nitrite: NEGATIVE
Protein, ur: NEGATIVE
Urobilinogen, UA: 1

## 2010-11-11 LAB — BASIC METABOLIC PANEL
Chloride: 106
GFR calc non Af Amer: >60
Potassium: 3.4 — ABNORMAL LOW
Sodium: 138

## 2010-11-11 LAB — ETHANOL
Alcohol, Ethyl (B): <5
Alcohol, Ethyl (B): <5

## 2011-04-25 DIAGNOSIS — F17200 Nicotine dependence, unspecified, uncomplicated: Secondary | ICD-10-CM | POA: Insufficient documentation

## 2011-04-25 DIAGNOSIS — T48201A Poisoning by unspecified drugs acting on muscles, accidental (unintentional), initial encounter: Secondary | ICD-10-CM | POA: Insufficient documentation

## 2011-04-25 DIAGNOSIS — T483X4A Poisoning by antitussives, undetermined, initial encounter: Secondary | ICD-10-CM | POA: Insufficient documentation

## 2011-04-25 DIAGNOSIS — R000 Tachycardia, unspecified: Secondary | ICD-10-CM | POA: Insufficient documentation

## 2011-04-25 NOTE — ED Notes (Signed)
Pt reports taking unknown amount of Coricidin cold medication.  States he took it about 2230. Per family, pt took entire box.  Pt denies intent to self harm, but states "I don't know" when asked why he took as much medication as he did.

## 2011-04-26 ENCOUNTER — Emergency Department (HOSPITAL_COMMUNITY)
Admission: EM | Admit: 2011-04-26 | Discharge: 2011-04-26 | Disposition: A | Discharge status: Home / Self Care | Payer: Self-pay | Attending: Emergency Medicine | Admitting: Emergency Medicine

## 2011-04-26 ENCOUNTER — Other Ambulatory Visit: Payer: Self-pay

## 2011-04-26 ENCOUNTER — Encounter (HOSPITAL_COMMUNITY): Payer: Self-pay | Admitting: *Deleted

## 2011-04-26 DIAGNOSIS — T50905A Adverse effect of unspecified drugs, medicaments and biological substances, initial encounter: Secondary | ICD-10-CM

## 2011-04-26 LAB — COMPREHENSIVE METABOLIC PANEL
ALT: 26 U/L (ref 0–53)
AST: 22 U/L (ref 0–37)
Albumin: 4.1 g/dL (ref 3.5–5.2)
CO2: 26 mEq/L (ref 19–32)
Calcium: 9.2 mg/dL (ref 8.4–10.5)
GFR calc non Af Amer: >90 mL/min (ref >90)
Sodium: 133 mEq/L — ABNORMAL LOW (ref 135–145)
Total Protein: 7.1 g/dL (ref 6.0–8.3)

## 2011-04-26 LAB — CBC
MCV: 82.1 fL (ref 78.0–100.0)
Platelets: 238 10*3/uL (ref 150–400)
RDW: 12.4 % (ref 11.5–15.5)
WBC: 8.7 10*3/uL (ref 4.0–10.5)

## 2011-04-26 LAB — DIFFERENTIAL
Basophils Absolute: 0 10*3/uL (ref 0.0–0.1)
Basophils Relative: 0 % (ref 0–1)
Eosinophils Relative: 1 % (ref 0–5)
Lymphocytes Relative: 22 % (ref 12–46)

## 2011-04-26 LAB — RAPID URINE DRUG SCREEN, HOSP PERFORMED
Barbiturates: NOT DETECTED
Tetrahydrocannabinol: NOT DETECTED

## 2011-04-26 LAB — SALICYLATE LEVEL: Salicylate Lvl: 5.3 mg/dL (ref 2.8–20.0)

## 2011-04-26 MED ORDER — SODIUM CHLORIDE 0.9 % IV BOLUS (SEPSIS)
1000.0000 mL | Freq: Once | INTRAVENOUS | Status: AC
  Administered 2011-04-25: 1000 mL via INTRAVENOUS

## 2011-04-26 MED ORDER — SODIUM CHLORIDE 0.9 % IV SOLN
Freq: Once | INTRAVENOUS | Status: AC
  Administered 2011-04-26: 02:00:00 via INTRAVENOUS

## 2011-04-26 NOTE — Discharge Instructions (Signed)
 RESOURCE GUIDE  Dental Problems  Patients with Medicaid: [facility]                     [facility] [address]                                           [address] Phone:  [phone]                                                  Phone:  [phone]  If unable to pay or uninsured, contact:  [facility] or [facility] to become qualified for the adult dental clinic.  Chronic Pain Problems Contact [facility]  [phone] Patients need to be referred by their primary care doctor.  Insufficient Money for Medicine Contact [employer]:  call "211" or [facility] 271-5999.  No Primary Care Doctor Call [facility]  832-8000 Other agencies that provide inexpensive medical care    [facility]  [phone]    [facility]  [phone]    [facility]  [phone]    Women[facility] Clinic  [phone]    [facility]  [phone]    [facility]  [phone]  Psychological Services [facility]  [phone] [facility]  [phone] [facility]   [phone] (emergency services [phone])  Substance Abuse Resources [facility]  336-882-2125 [facility] 336-784-9470 [facility] 336-285-9073 [facility] 336-845-3988 Residential & Outpatient Substance Abuse Program  800-659-3381  Abuse/Neglect [place] Child Abuse Hotline (336) 641-3795 [place] Child Abuse Hotline 800-378-5315 (After Hours)  Emergency Shelter [facility] (336) 271-5985  [facility] (336) 275-9566 [facility] (704) 372-4663  MRSA Hotline #:   832-7006    [place] [facility]  [facility] [place]     [employer]                          [facility] [address] [place]                       [address]      [address]  [place]                                                [place]                            [place] Phone:  [phone]                                   Phone:  [phone]                 Phone:  [phone]  [facility] Phone:    [phone]  [place] Child Abuse Hotline (336) 342-1394 (336) 342-3537 (After Hours)   

## 2011-04-26 NOTE — ED Provider Notes (Signed)
History     CSN: [id number]  Arrival date & time 04/25/11  2350   First MD Initiated Contact with Patient 04/26/11 0005      Chief Complaint  Patient presents with  . Drug Overdose    (Consider location/radiation/quality/duration/timing/severity/associated sxs/prior treatment) Patient is a 21 y.o. male presenting with Overdose. The history is provided by the patient and a parent.  Drug Overdose   patient here after ingesting cold medication containing dextromethorphan in an attempt to become intoxicated. According to his mom, patient has a history of drug abuse. He denies any suicidal or homicidal ideations. His mother confirms this. Denies any other ingestions at this time. No use of Tylenol or aspirin. No use of alcohol this evening. No auditory or visual hallucinations. Nothing makes her symptoms better or worse. No fever reported recently  History reviewed. No pertinent past medical history.  Past Surgical History  Procedure Date  . Appendectomy     History reviewed. No pertinent family history.  History  Substance Use Topics  . Smoking status: Current Everyday Smoker  . Smokeless tobacco: Not on file  . Alcohol Use: No      Review of Systems  All other systems reviewed and are negative.    Allergies  Review of patient's allergies indicates no known allergies.  Home Medications  No current outpatient prescriptions on file.  BP 153/86  Pulse 120  Resp 20  Ht 5\' 11"  (1.803 m)  Wt 185 lb (83.915 kg)  BMI 25.80 kg/m2  SpO2 100%  Physical Exam  Nursing note and vitals reviewed. Constitutional: He is oriented to person, place, and time. He appears well-developed and well-nourished.  Non-toxic appearance. No distress.  HENT:  Head: Normocephalic and atraumatic.  Eyes: Conjunctivae, EOM and lids are normal. Pupils are equal, round, and reactive to light.  Neck: Normal range of motion. Neck supple. No tracheal deviation present. No mass present.    Cardiovascular: Regular rhythm and normal heart sounds.  Tachycardia present.  Exam reveals no gallop.   No murmur heard. Pulmonary/Chest: Effort normal and breath sounds normal. No stridor. No respiratory distress. He has no decreased breath sounds. He has no wheezes. He has no rhonchi. He has no rales.  Abdominal: Soft. Normal appearance and bowel sounds are normal. He exhibits no distension. There is no tenderness. There is no rebound and no CVA tenderness.  Musculoskeletal: Normal range of motion. He exhibits no edema and no tenderness.  Neurological: He is alert and oriented to person, place, and time. He has normal strength. No cranial nerve deficit or sensory deficit. GCS eye subscore is 4. GCS verbal subscore is 5. GCS motor subscore is 6.  Skin: Skin is warm and dry. No abrasion and no rash noted.  Psychiatric: His speech is normal and behavior is normal. His affect is blunt. He expresses no homicidal and no suicidal ideation.    ED Course  Procedures (including critical care time)   Labs Reviewed  ETHANOL  URINE RAPID DRUG SCREEN (HOSP PERFORMED)  ACETAMINOPHEN LEVEL  SALICYLATE LEVEL  CBC  DIFFERENTIAL  COMPREHENSIVE METABOLIC PANEL   No results found.   No diagnosis found.    MDM   Date: 04/26/2011  Rate: 114  Rhythm: sinus tachycardia  QRS Axis: normal  Intervals: normal  ST/T Wave abnormalities: normal  Conduction Disutrbances:incomplete rbbb  Narrative Interpretation:   Old EKG Reviewed: none available    3:49 AM Patient rechecked and alert and oriented x4. Continues to deny suicidal or homicidal  ideations. No signs of auditory or visual hallucinations. I had a long discussion with the patient's mother and she requests I give the patient a referral for outpatient services which I will do. He is stable for discharge at this time      Toy Baker, MD 04/26/11 [phone]

## 2011-04-26 NOTE — ED Notes (Signed)
Pt resting quietly.  Respirations regular and unlabored.  No distress noted.

## 2011-04-26 NOTE — ED Notes (Signed)
Called poison control, spoke with Theodoro Grist, would advise no further treatment at this time in addition to what we have already done.

## 2015-09-09 ENCOUNTER — Other Ambulatory Visit (HOSPITAL_COMMUNITY): Payer: Self-pay | Admitting: Respiratory Therapy

## 2015-09-09 DIAGNOSIS — G4730 Sleep apnea, unspecified: Secondary | ICD-10-CM

## 2015-09-09 DIAGNOSIS — G4710 Hypersomnia, unspecified: Secondary | ICD-10-CM

## 2015-09-27 ENCOUNTER — Ambulatory Visit: Payer: BLUE CROSS/BLUE SHIELD | Attending: Pulmonary Disease | Admitting: Neurology

## 2015-09-27 DIAGNOSIS — G4710 Hypersomnia, unspecified: Secondary | ICD-10-CM

## 2015-09-27 DIAGNOSIS — G4730 Sleep apnea, unspecified: Secondary | ICD-10-CM

## 2015-09-27 DIAGNOSIS — G4733 Obstructive sleep apnea (adult) (pediatric): Secondary | ICD-10-CM | POA: Insufficient documentation

## 2015-10-07 NOTE — Procedures (Signed)
  [facility] Laiylah Roettger A. Gerilyn Pilgrim, MD     [email or web address]             NOCTURNAL POLYSOMNOGRAPHY   LOCATION: [facility]   Patient Name: Herbert Walsh, Herbert Walsh Date: 09/27/2015 Gender: Male D.O.B: 1990-07-09 Age (years): 25 Referring Provider: Not Available Height (inches): 72 Interpreting Physician: Beryle Beams MD, ABSM Weight (lbs): 245 RPSGT: Alfonso Ellis BMI: 33 MRN: Neck Size: 16.00 CLINICAL INFORMATION Sleep Study Type: NPSG Indication for sleep study: N/A Epworth Sleepiness Score: 4 SLEEP STUDY TECHNIQUE As per the AASM Manual for the Scoring of Sleep and Associated Events v2.3 (April 2016) with a hypopnea requiring 4% desaturations. The channels recorded and monitored were frontal, central and occipital EEG, electrooculogram (EOG), submentalis EMG (chin), nasal and oral airflow, thoracic and abdominal wall motion, anterior tibialis EMG, snore microphone, electrocardiogram, and pulse oximetry. MEDICATIONS Patient's medications include: N/A. Medications self-administered by patient during sleep study : No sleep medicine administered. No current outpatient prescriptions on file.  SLEEP ARCHITECTURE The study was initiated at 10:45:12 PM and ended at 5:17:26 AM. Sleep onset time was 26.3 minutes and the sleep efficiency was 83.7%. The total sleep time was 328.5 minutes. Stage REM latency was 319.0 minutes. The patient spent 6.39% of the night in stage N1 sleep, 91.02% in stage N2 sleep, 0.00% in stage N3 and 2.59% in REM. Alpha intrusion was absent. Supine sleep was 67.42%. RESPIRATORY PARAMETERS The overall apnea/hypopnea index (AHI) was 9.1 per hour. There were 18 total apneas, including 7 obstructive, 2 central and 9 mixed apneas. There were 32 hypopneas and 0 RERAs. The AHI during Stage REM sleep was 21.2 per hour. AHI while supine was 11.1 per hour. The mean oxygen saturation was 94.77%. The minimum SpO2 during sleep was 86.00%. Moderate snoring was  noted during this study. CARDIAC DATA The 2 lead EKG demonstrated sinus rhythm. The mean heart rate was N/A beats per minute. Other EKG findings include: None. LEG MOVEMENT DATA The total PLMS were 0 with a resulting PLMS index of 0.00. Associated arousal with leg movement index was 0.0.   IMPRESSIONS - Mild obstructive sleep apnea worse during REM sleep not requiring positive pressure treatment.   Argie Ramming, MD Diplomate, American Board of Sleep Medicine.

## 2015-10-26 ENCOUNTER — Other Ambulatory Visit (HOSPITAL_COMMUNITY)
Admission: RE | Admit: 2015-10-26 | Discharge: 2015-10-26 | Disposition: A | Discharge status: Home / Self Care | Payer: BLUE CROSS/BLUE SHIELD | Source: Ambulatory Visit | Attending: Pulmonary Disease | Admitting: Pulmonary Disease

## 2015-10-26 DIAGNOSIS — F329 Major depressive disorder, single episode, unspecified: Secondary | ICD-10-CM | POA: Insufficient documentation

## 2015-10-26 DIAGNOSIS — I10 Essential (primary) hypertension: Secondary | ICD-10-CM | POA: Diagnosis present

## 2015-10-26 DIAGNOSIS — G4710 Hypersomnia, unspecified: Secondary | ICD-10-CM | POA: Diagnosis present

## 2015-10-26 LAB — CBC
HCT: 46.7 % (ref 39.0–52.0)
HEMOGLOBIN: 15.8 g/dL (ref 13.0–17.0)
MCH: 27.6 pg (ref 26.0–34.0)
MCHC: 33.8 g/dL (ref 30.0–36.0)
MCV: 81.5 fL (ref 78.0–100.0)
Platelets: 279 10*3/uL (ref 150–400)
RBC: 5.73 MIL/uL (ref 4.22–5.81)
RDW: 13.6 % (ref 11.5–15.5)
WBC: 7.3 10*3/uL (ref 4.0–10.5)

## 2015-10-26 LAB — COMPREHENSIVE METABOLIC PANEL
ALK PHOS: 85 U/L (ref 38–126)
ALT: 22 U/L (ref 17–63)
ANION GAP: 8 (ref 5–15)
AST: 25 U/L (ref 15–41)
Albumin: 3.9 g/dL (ref 3.5–5.0)
BILIRUBIN TOTAL: 0.2 mg/dL — AB (ref 0.3–1.2)
BUN: 14 mg/dL (ref 6–20)
CALCIUM: 9 mg/dL (ref 8.9–10.3)
CO2: 24 mmol/L (ref 22–32)
CREATININE: 0.91 mg/dL (ref 0.61–1.24)
Chloride: 104 mmol/L (ref 101–111)
Glucose, Bld: 130 mg/dL — ABNORMAL HIGH (ref 65–99)
Potassium: 3.8 mmol/L (ref 3.5–5.1)
SODIUM: 136 mmol/L (ref 135–145)
TOTAL PROTEIN: 7 g/dL (ref 6.5–8.1)

## 2015-10-26 LAB — LIPID PANEL
CHOLESTEROL: 273 mg/dL — AB (ref 0–200)
HDL: 37 mg/dL — AB (ref >40)
LDL Cholesterol: 193 mg/dL — ABNORMAL HIGH (ref 0–99)
TRIGLYCERIDES: 215 mg/dL — AB (ref <150)
Total CHOL/HDL Ratio: 7.4 RATIO
VLDL: 43 mg/dL — ABNORMAL HIGH (ref 0–40)

## 2015-10-26 LAB — TSH: TSH: 0.655 u[IU]/mL (ref 0.350–4.500)

## 2016-07-07 ENCOUNTER — Other Ambulatory Visit (HOSPITAL_COMMUNITY)
Admission: RE | Admit: 2016-07-07 | Discharge: 2016-07-07 | Disposition: A | Discharge status: Home / Self Care | Payer: BLUE CROSS/BLUE SHIELD | Source: Ambulatory Visit | Attending: Pulmonary Disease | Admitting: Pulmonary Disease

## 2016-07-07 DIAGNOSIS — I10 Essential (primary) hypertension: Secondary | ICD-10-CM | POA: Diagnosis present

## 2016-07-07 DIAGNOSIS — F319 Bipolar disorder, unspecified: Secondary | ICD-10-CM | POA: Diagnosis present

## 2016-07-07 DIAGNOSIS — E785 Hyperlipidemia, unspecified: Secondary | ICD-10-CM | POA: Diagnosis present

## 2016-07-07 DIAGNOSIS — F4310 Post-traumatic stress disorder, unspecified: Secondary | ICD-10-CM | POA: Insufficient documentation

## 2016-07-07 LAB — LIPID PANEL
CHOL/HDL RATIO: 5.1 ratio
Cholesterol: 189 mg/dL (ref 0–200)
HDL: 37 mg/dL — AB (ref >40)
LDL CALC: 120 mg/dL — AB (ref 0–99)
TRIGLYCERIDES: 158 mg/dL — AB (ref <150)
VLDL: 32 mg/dL (ref 0–40)

## 2016-07-07 LAB — HEPATIC FUNCTION PANEL
ALT: 21 U/L (ref 17–63)
AST: 19 U/L (ref 15–41)
Albumin: 3.8 g/dL (ref 3.5–5.0)
Alkaline Phosphatase: 100 U/L (ref 38–126)
BILIRUBIN DIRECT: 0.1 mg/dL (ref 0.1–0.5)
BILIRUBIN INDIRECT: 0.1 mg/dL — AB (ref 0.3–0.9)
TOTAL PROTEIN: 7.1 g/dL (ref 6.5–8.1)
Total Bilirubin: 0.2 mg/dL — ABNORMAL LOW (ref 0.3–1.2)

## 2016-07-07 LAB — TSH: TSH: 2.137 u[IU]/mL (ref 0.350–4.500)

## 2017-02-13 DIAGNOSIS — I10 Essential (primary) hypertension: Secondary | ICD-10-CM

## 2017-02-13 HISTORY — DX: Essential (primary) hypertension: I10
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
## 2017-11-01 ENCOUNTER — Encounter (HOSPITAL_COMMUNITY): Payer: Self-pay | Admitting: Psychiatry

## 2017-11-01 ENCOUNTER — Ambulatory Visit (INDEPENDENT_AMBULATORY_CARE_PROVIDER_SITE_OTHER): Payer: BLUE CROSS/BLUE SHIELD | Admitting: Psychiatry

## 2017-11-01 ENCOUNTER — Encounter (INDEPENDENT_AMBULATORY_CARE_PROVIDER_SITE_OTHER): Payer: Self-pay

## 2017-11-01 VITALS — BP 141/94 | HR 123 | Ht 70.5 in | Wt 260.0 lb

## 2017-11-01 DIAGNOSIS — F4310 Post-traumatic stress disorder, unspecified: Secondary | ICD-10-CM | POA: Diagnosis not present

## 2017-11-01 DIAGNOSIS — F419 Anxiety disorder, unspecified: Secondary | ICD-10-CM

## 2017-11-01 DIAGNOSIS — G4700 Insomnia, unspecified: Secondary | ICD-10-CM

## 2017-11-01 DIAGNOSIS — F17210 Nicotine dependence, cigarettes, uncomplicated: Secondary | ICD-10-CM

## 2017-11-01 DIAGNOSIS — R454 Irritability and anger: Secondary | ICD-10-CM

## 2017-11-01 MED ORDER — CITALOPRAM HYDROBROMIDE 40 MG PO TABS: 40.0000 mg | ORAL_TABLET | Freq: Every day | ORAL | 0 refills | Status: DC

## 2017-11-01 MED ORDER — RISPERIDONE 2 MG PO TABS: ORAL_TABLET | ORAL | 0 refills | Status: DC

## 2017-11-01 MED ORDER — CLONIDINE HCL 0.2 MG PO TABS: 0.2000 mg | ORAL_TABLET | Freq: Two times a day (BID) | ORAL | 0 refills | Status: DC

## 2017-11-01 NOTE — Patient Instructions (Addendum)
1. Start risperidone 2 mg at night  2. Continue citalopram 40 mg daily  3. Continue clonidine 0.2 mg twice a day 4. Referral to psychology for neuropsych evaluation 5. Return to clinic in one month for 30 mins  6. CONTACT INFORMATION  What to do if you need to get in touch with someone regarding a psychiatric issue:  1. EMERGENCY: For psychiatric emergencies (if you are suicidal or if there are any other safety issues) call 911 and/or go to your nearest Emergency Room immediately.   2. IF YOU NEED SOMEONE TO TALK TO RIGHT NOW: Given my clinical responsibilities, I may not be able to speak with you over the phone for a prolonged period of time.  a. You may always call The National Suicide Prevention Lifeline at 1-800-273-TALK (215)831-1829).  b. Your county of residence will also have local crisis services. For [facility]: [facility] at [phone] (24 Hour Crisis Hotline)

## 2017-11-05 NOTE — Addendum Note (Signed)
Addended by: Neysa Hotter on: 11/05/2017 12:52 PM   Modules accepted: Orders

## 2017-11-06 NOTE — Addendum Note (Signed)
Addended by: Neysa Hotter on: 11/06/2017 11:05 AM   Modules accepted: Orders

## 2017-11-20 ENCOUNTER — Other Ambulatory Visit: Payer: Self-pay | Admitting: Surgery

## 2017-11-20 DIAGNOSIS — R1900 Intra-abdominal and pelvic swelling, mass and lump, unspecified site: Secondary | ICD-10-CM
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
## 2017-12-06 ENCOUNTER — Ambulatory Visit (INDEPENDENT_AMBULATORY_CARE_PROVIDER_SITE_OTHER): Payer: BLUE CROSS/BLUE SHIELD | Admitting: Psychiatry

## 2017-12-06 VITALS — BP 133/86 | HR 118 | Ht 70.51 in | Wt 268.0 lb

## 2017-12-06 DIAGNOSIS — F1590 Other stimulant use, unspecified, uncomplicated: Secondary | ICD-10-CM | POA: Diagnosis not present

## 2017-12-06 DIAGNOSIS — G4700 Insomnia, unspecified: Secondary | ICD-10-CM

## 2017-12-06 DIAGNOSIS — F419 Anxiety disorder, unspecified: Secondary | ICD-10-CM

## 2017-12-06 DIAGNOSIS — F4310 Post-traumatic stress disorder, unspecified: Secondary | ICD-10-CM | POA: Diagnosis not present

## 2017-12-06 MED ORDER — CLONIDINE HCL 0.2 MG PO TABS: 0.2000 mg | ORAL_TABLET | Freq: Two times a day (BID) | ORAL | 0 refills | Status: DC

## 2017-12-06 MED ORDER — CITALOPRAM HYDROBROMIDE 40 MG PO TABS: 40.0000 mg | ORAL_TABLET | Freq: Every day | ORAL | 0 refills | Status: DC

## 2017-12-06 MED ORDER — RISPERIDONE 3 MG PO TABS: 3.0000 mg | ORAL_TABLET | Freq: Every day | ORAL | 0 refills | Status: DC

## 2017-12-06 NOTE — Patient Instructions (Addendum)
1. Increase risperidone 3 mg at night  2. Continue citalopram 40 mg daily  3. Continue clonidine 0.2 mg twice a day 4. Return to clinic in three months for 30 mins

## 2017-12-19 ENCOUNTER — Ambulatory Visit
Admission: RE | Admit: 2017-12-19 | Discharge: 2017-12-19 | Disposition: A | Discharge status: Home / Self Care | Payer: BLUE CROSS/BLUE SHIELD | Source: Ambulatory Visit | Attending: Surgery | Admitting: Surgery

## 2017-12-19 DIAGNOSIS — R1900 Intra-abdominal and pelvic swelling, mass and lump, unspecified site: Secondary | ICD-10-CM

## 2017-12-19 IMAGING — CT CT ABD-PELV W/ CM
1 of 2 series · 13 of 32 positions shown, 18 images · IV contrast (APPLIED)
Comparison: CT Abdomen and Pelvis [DATE].

CLINICAL DATA: 27-year-old male with right upper quadrant pain and
bruising. "Right flank abdominal wall mass". Remote appendectomy.

EXAM:
CT ABDOMEN AND PELVIS WITH CONTRAST
TECHNIQUE: Multidetector CT imaging of the abdomen and pelvis was performed
using the standard protocol following bolus administration of
intravenous contrast.
CONTRAST:  100mL [Z4] IOPAMIDOL ([Z4]) INJECTION 61%

[Series 4: abd/pelvis w/cm · axial · 0.94mm/px · z∈[-465,-50]mm · 13 of 93 slices shown, 18 images]
[im 5/93  soft-tissue]
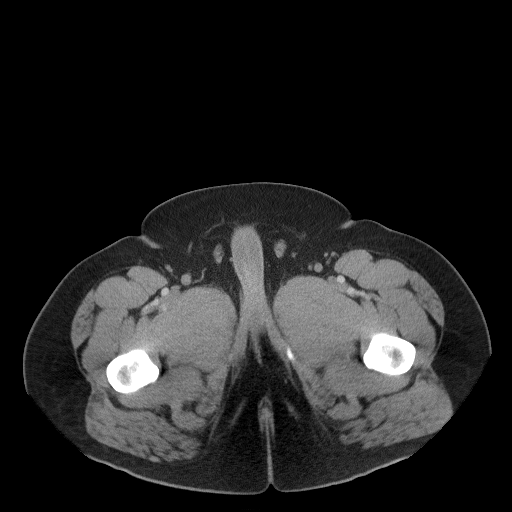
[im 5/93  bone]
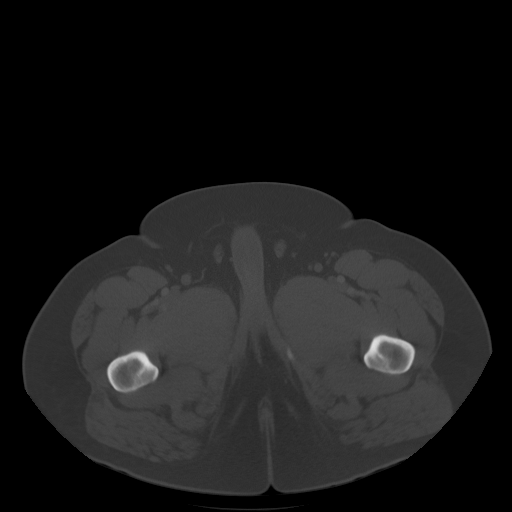
[im 15/93  soft-tissue]
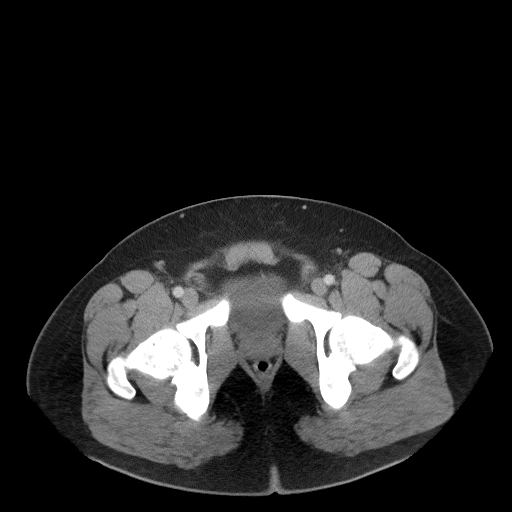
[im 20/93  soft-tissue]
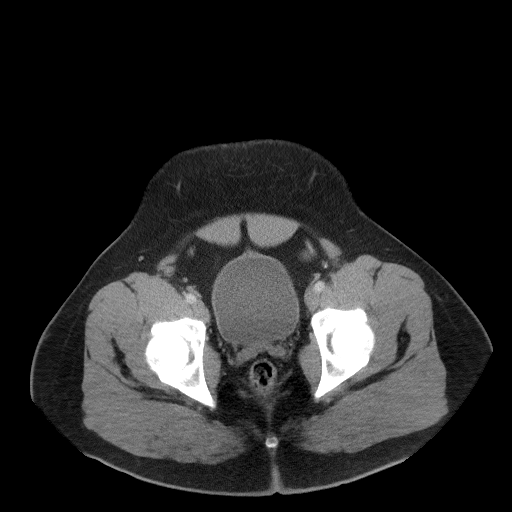
[im 30/93  soft-tissue]
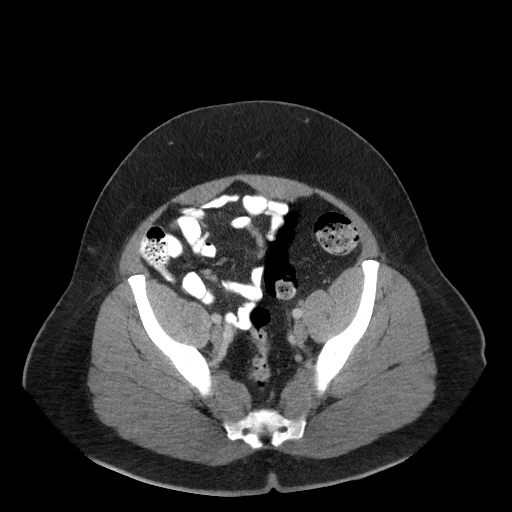
[im 34/93  soft-tissue]
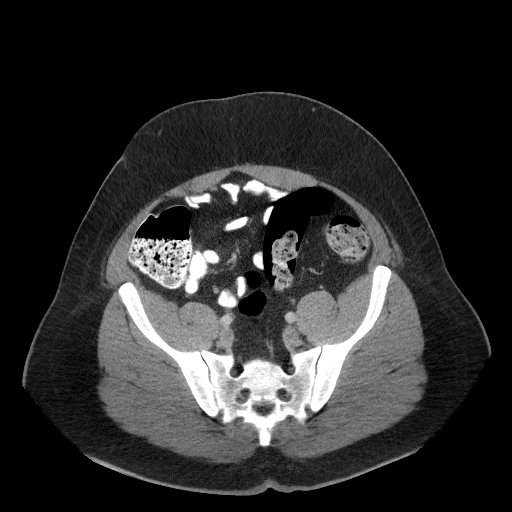
[im 44/93  soft-tissue]
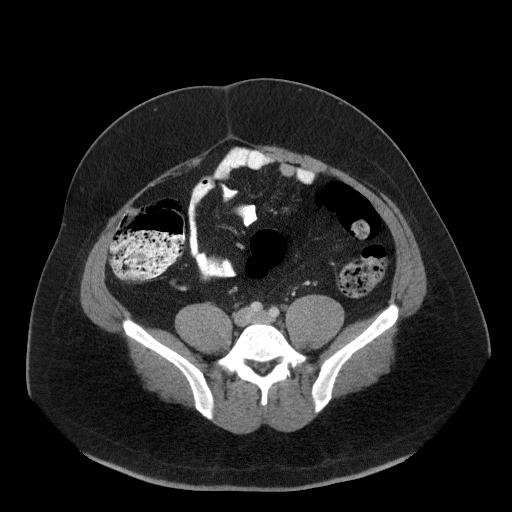
[im 49/93  soft-tissue]
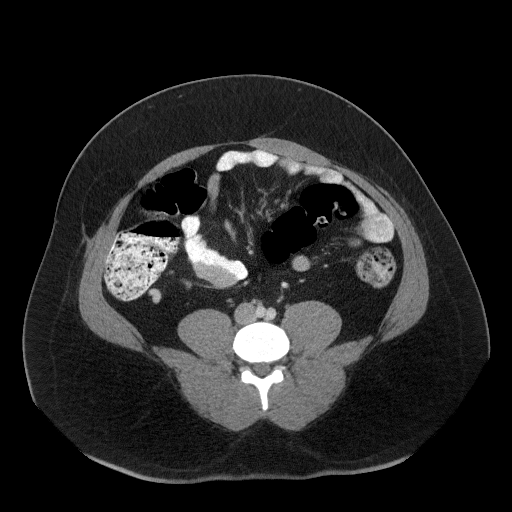
[im 59/93  soft-tissue]
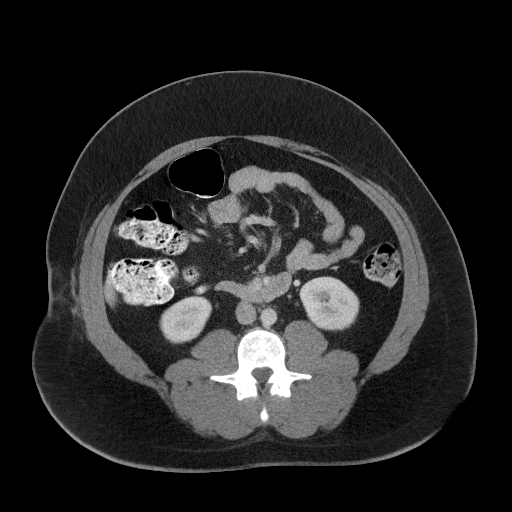
[im 63/93  soft-tissue]
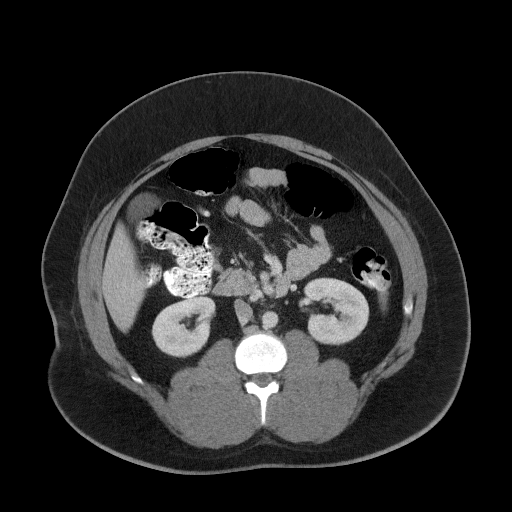
[im 63/93  bone]
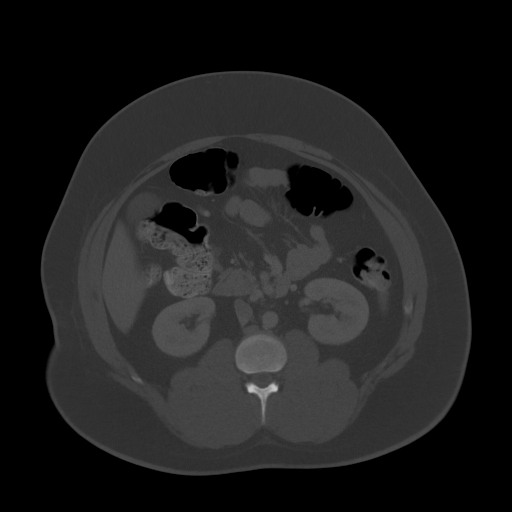
[im 73/93  soft-tissue]
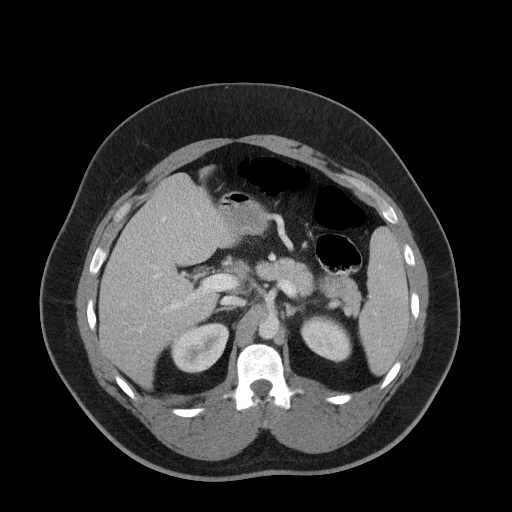
[im 73/93  lung]
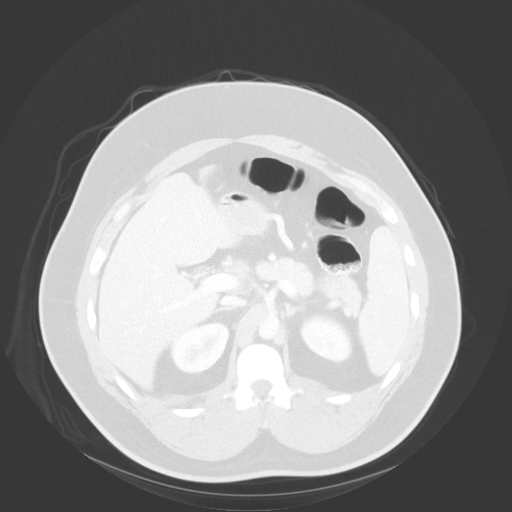
[im 78/93  soft-tissue]
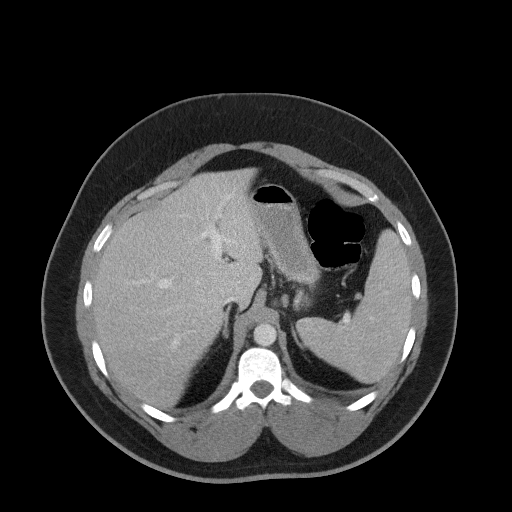
[im 78/93  lung]
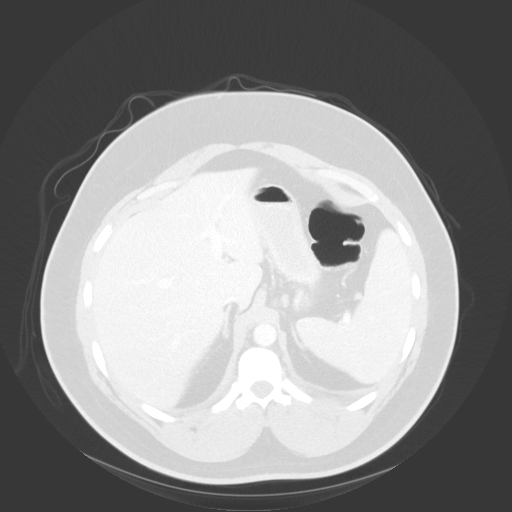
[im 83/93  lung]
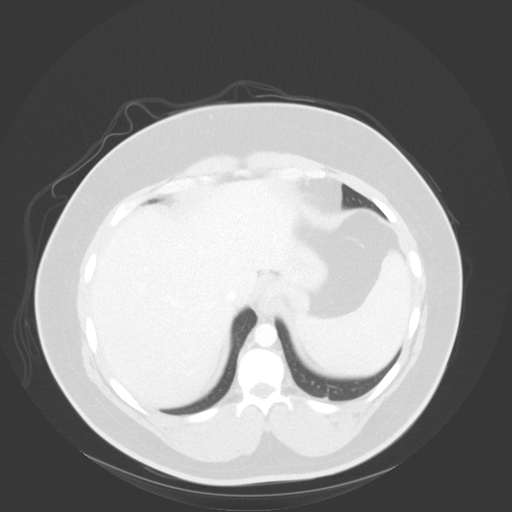
[im 88/93  soft-tissue]
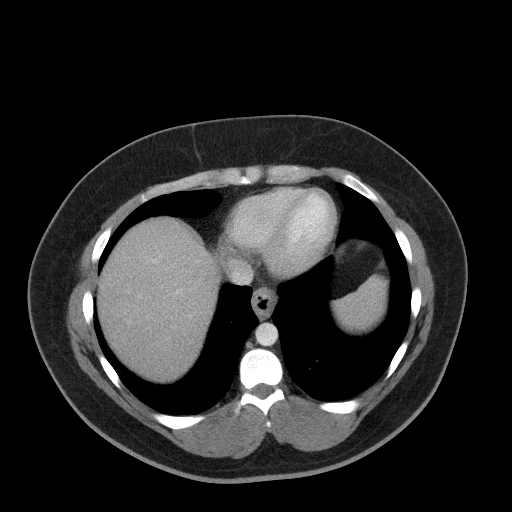
[im 88/93  lung]
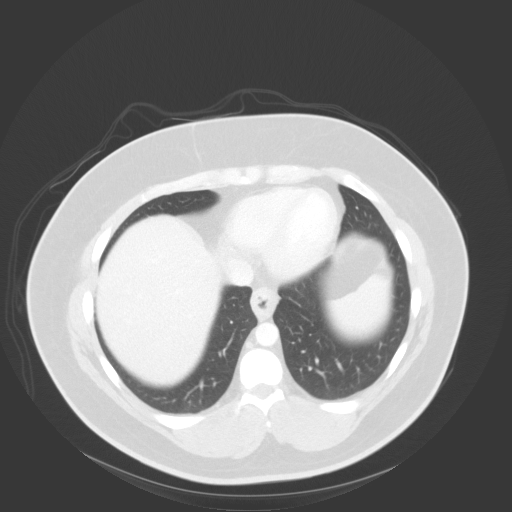

[13 of 32 positions shown; findings below may reference images not displayed]

FINDINGS: Lower chest: Negative.

Hepatobiliary: Negative liver and gallbladder.

Pancreas: Negative.

Spleen: Negative.

Adrenals/Urinary Tract: Normal adrenal glands.

Both kidneys appear stable and normal. Negative course of both
ureters.

Unremarkable urinary bladder.

Stomach/Bowel: Negative rectum. Negative sigmoid colon aside from
redundancy. Mild retained stool in the descending colon. Redundant
transverse colon with retained stool mixed with contrast at the
hepatic flexure. Negative right colon. Evidence of prior
appendectomy. Negative terminal ileum (coronal image 59).

No dilated or abnormal small bowel. Possible small gastric hiatal
hernia. The intraabdominal stomach and duodenum are within normal
limits. Mild circumferential wall thickening of the distal esophagus
(series 4, image 5).

No free air, free fluid.

Vascular/Lymphatic: The major arterial structures are patent and
appear normal. The main portal vein and splenic vein are patent.

There appears to be chronic thrombosis of the superior mesenteric
vein (coronal image 69) with clustered regional varices or cavernous
transformation on coronal image 71. No mesenteric edema. No discrete
intestinal varices.

Reproductive: Negative.

Other: No pelvic free fluid.

Musculoskeletal: Stable and negative visible osseous structures.

Postoperative changes to the right lower quadrant abdominal wall,
subcutaneous fat on series 4, image 55.

There is nonspecific new subcutaneous fat stranding along the right
lateral flank on series 4, image 35. There is overlying mild skin
thickening in this area and mild new skin deformity. The underlying
oblique abdominal musculature appears normal. But there is fascial
thickening in the region. No associated abdominal hernia.

Elsewhere the abdominal wall appears stable. No asymmetric
subcutaneous fat identified to suggest lipoma.
IMPRESSION: 1. Nonspecific appearance of right lateral body wall/flank
inflammation and/or scarring seen on series 4, image 35. The
underlying oblique abdominal muscles appear normal. The etiology and
significance is unclear. There is no associated abdominal hernia.
2. Chronic SMV thrombosis with developed mesenteric venous
collaterals. No complicating features identified.
3. Unrelated appearing suggestion of mild circumferential wall
thickening in the distal esophagus. There may be a superimposed
small gastric hiatal hernia.

## 2017-12-19 MED ORDER — IOPAMIDOL (ISOVUE-300) INJECTION 61%
100.0000 mL | Freq: Once | INTRAVENOUS | Status: AC | PRN
  Administered 2017-12-19: 100 mL via INTRAVENOUS

## 2018-01-29 ENCOUNTER — Encounter (HOSPITAL_COMMUNITY): Payer: Self-pay | Admitting: Emergency Medicine

## 2018-01-29 ENCOUNTER — Other Ambulatory Visit: Payer: Self-pay

## 2018-01-29 ENCOUNTER — Emergency Department (HOSPITAL_COMMUNITY): Payer: BLUE CROSS/BLUE SHIELD

## 2018-01-29 ENCOUNTER — Emergency Department (HOSPITAL_COMMUNITY)
Admission: EM | Admit: 2018-01-29 | Discharge: 2018-01-29 | Disposition: A | Discharge status: Home / Self Care | Payer: BLUE CROSS/BLUE SHIELD | Attending: Emergency Medicine | Admitting: Emergency Medicine

## 2018-01-29 DIAGNOSIS — I10 Essential (primary) hypertension: Secondary | ICD-10-CM | POA: Insufficient documentation

## 2018-01-29 DIAGNOSIS — F17210 Nicotine dependence, cigarettes, uncomplicated: Secondary | ICD-10-CM | POA: Insufficient documentation

## 2018-01-29 DIAGNOSIS — K529 Noninfective gastroenteritis and colitis, unspecified: Secondary | ICD-10-CM | POA: Insufficient documentation

## 2018-01-29 DIAGNOSIS — Z79899 Other long term (current) drug therapy: Secondary | ICD-10-CM | POA: Insufficient documentation

## 2018-01-29 DIAGNOSIS — R1032 Left lower quadrant pain: Secondary | ICD-10-CM | POA: Diagnosis present

## 2018-01-29 LAB — CBC WITH DIFFERENTIAL/PLATELET
ABS IMMATURE GRANULOCYTES: 0.08 10*3/uL — AB (ref 0.00–0.07)
BASOS PCT: 0 %
Basophils Absolute: 0 10*3/uL (ref 0.0–0.1)
EOS ABS: 0.2 10*3/uL (ref 0.0–0.5)
Eosinophils Relative: 1 %
HCT: 41.2 % (ref 39.0–52.0)
Hemoglobin: 13.1 g/dL (ref 13.0–17.0)
IMMATURE GRANULOCYTES: 1 %
Lymphocytes Relative: 14 %
Lymphs Abs: 2.4 10*3/uL (ref 0.7–4.0)
MCH: 25.8 pg — ABNORMAL LOW (ref 26.0–34.0)
MCHC: 31.8 g/dL (ref 30.0–36.0)
MCV: 81.3 fL (ref 80.0–100.0)
Monocytes Absolute: 1.1 10*3/uL — ABNORMAL HIGH (ref 0.1–1.0)
Monocytes Relative: 6 %
NEUTROS ABS: 13.4 10*3/uL — AB (ref 1.7–7.7)
NEUTROS PCT: 78 %
PLATELETS: 340 10*3/uL (ref 150–400)
RBC: 5.07 MIL/uL (ref 4.22–5.81)
RDW: 13.9 % (ref 11.5–15.5)
WBC: 17.1 10*3/uL — ABNORMAL HIGH (ref 4.0–10.5)
nRBC: 0 % (ref 0.0–0.2)

## 2018-01-29 LAB — BASIC METABOLIC PANEL
ANION GAP: 8 (ref 5–15)
BUN: 13 mg/dL (ref 6–20)
CALCIUM: 8.7 mg/dL — AB (ref 8.9–10.3)
CO2: 25 mmol/L (ref 22–32)
Chloride: 103 mmol/L (ref 98–111)
Creatinine, Ser: 0.94 mg/dL (ref 0.61–1.24)
GLUCOSE: 137 mg/dL — AB (ref 70–99)
Potassium: 3.5 mmol/L (ref 3.5–5.1)
Sodium: 136 mmol/L (ref 135–145)

## 2018-01-29 IMAGING — CT CT ABD-PELV W/ CM
2 of 4 series · 16 of 46 positions shown, 18 images · IV contrast (Isovue)
Comparison: CT [DATE], remote CT [DATE]

CLINICAL DATA: Left lower quadrant pain.

EXAM:
CT ABDOMEN AND PELVIS WITH CONTRAST
TECHNIQUE: Multidetector CT imaging of the abdomen and pelvis was performed
using the standard protocol following bolus administration of
intravenous contrast.
CONTRAST:  100mL [NT] IOPAMIDOL ([NT]) INJECTION 61%

[Series 3: axial st · axial · 0.97mm/px · z∈[-435,+0]mm · 13 of 97 slices shown, 15 images]
[im 5/97  soft-tissue]
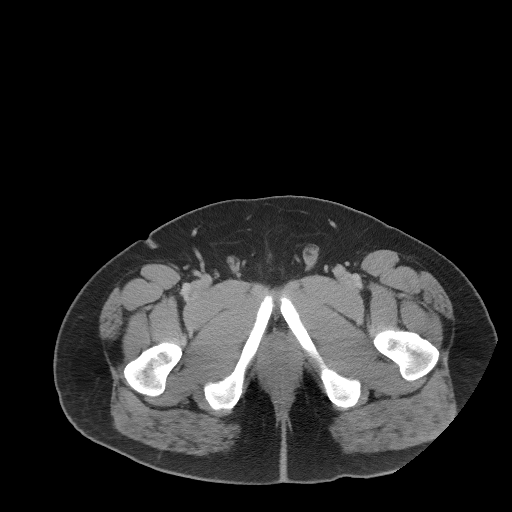
[im 5/97  bone]
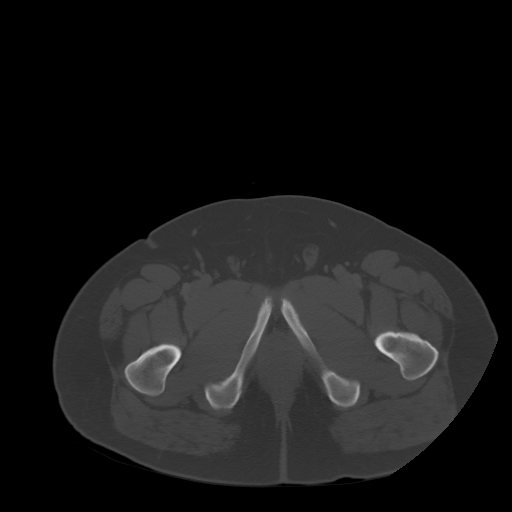
[im 14/97  soft-tissue]
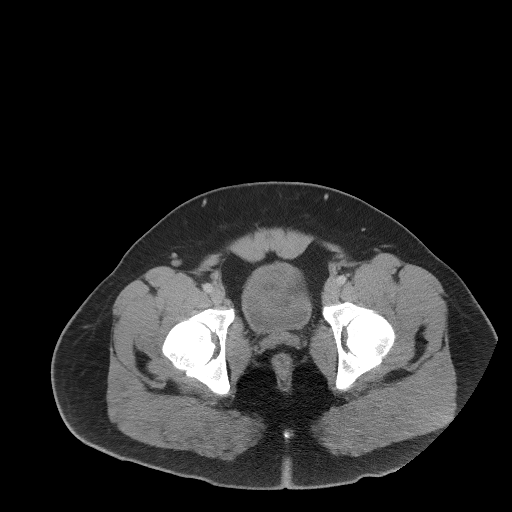
[im 22/97  soft-tissue]
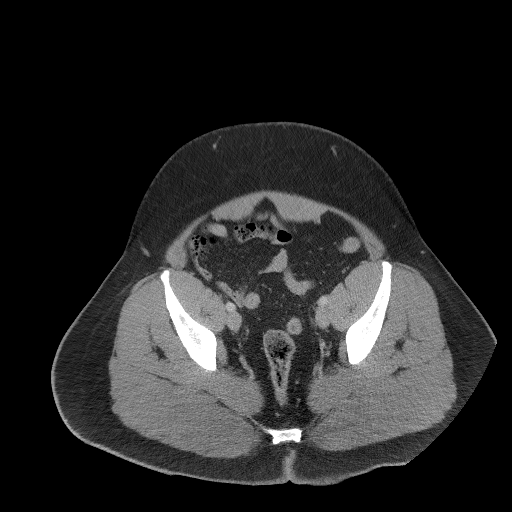
[im 27/97  soft-tissue]
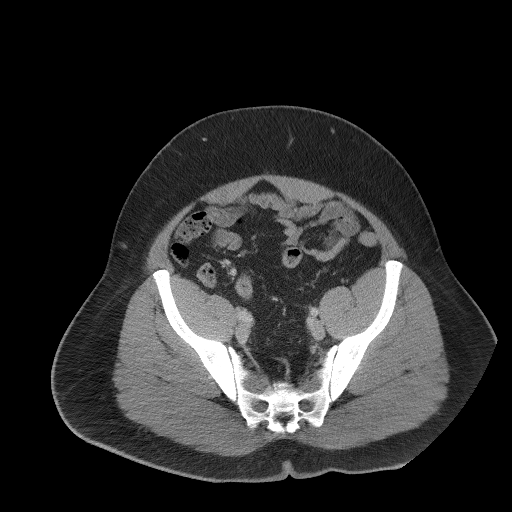
[im 35/97  soft-tissue]
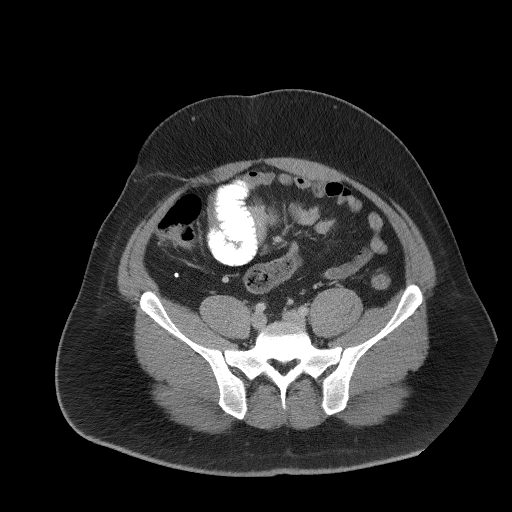
[im 40/97  soft-tissue]
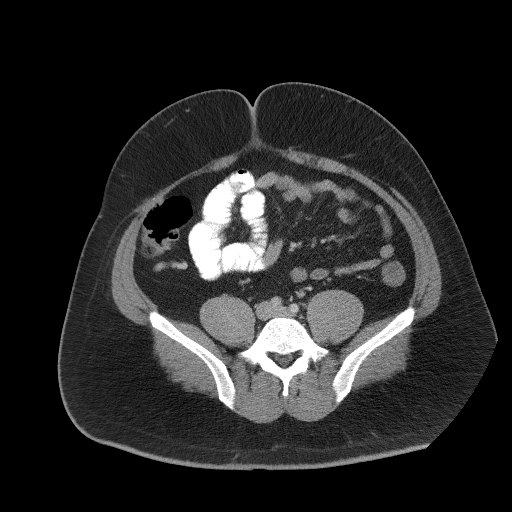
[im 49/97  soft-tissue]
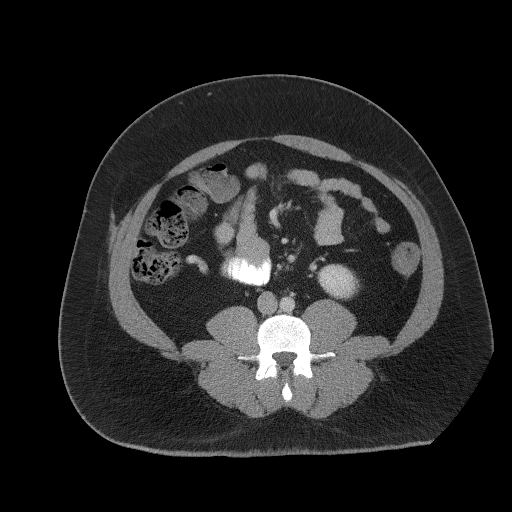
[im 57/97  soft-tissue]
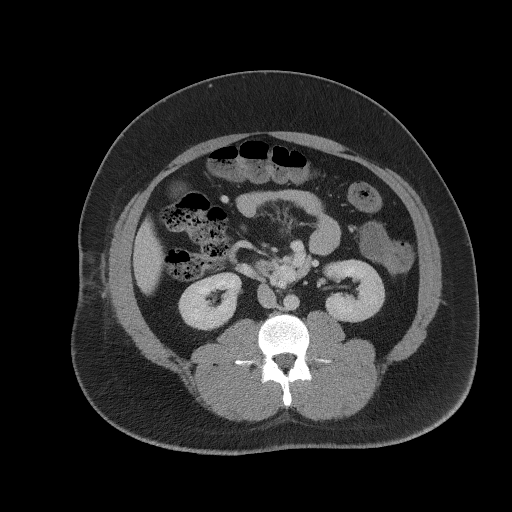
[im 62/97  soft-tissue]
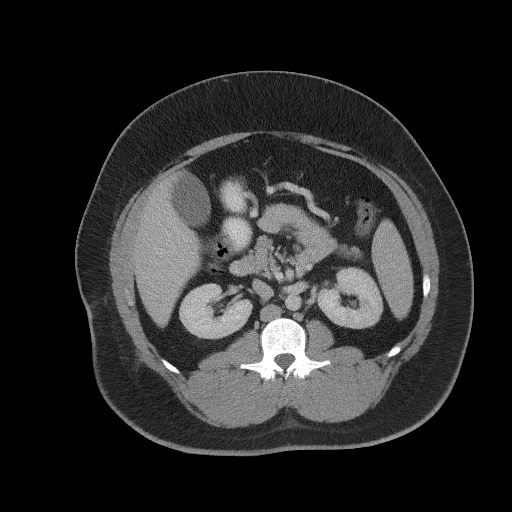
[im 62/97  bone]
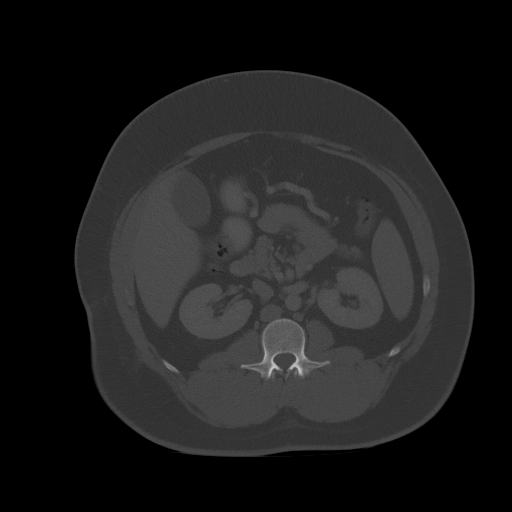
[im 70/97  soft-tissue]
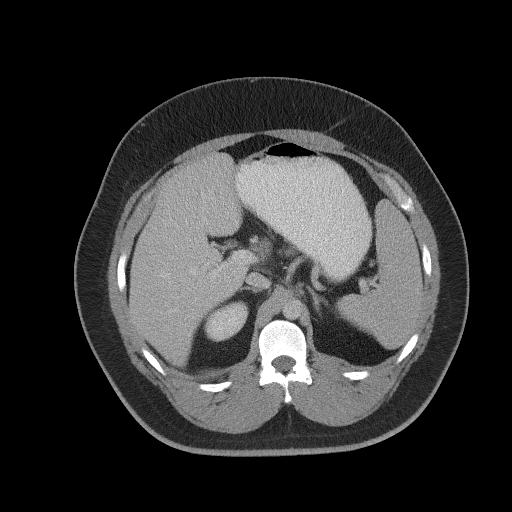
[im 75/97  soft-tissue]
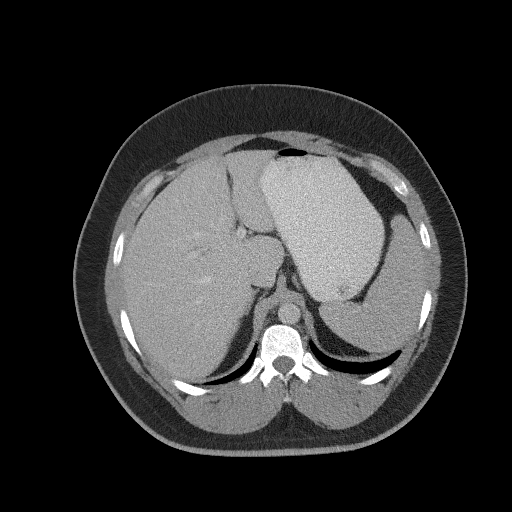
[im 83/97  soft-tissue]
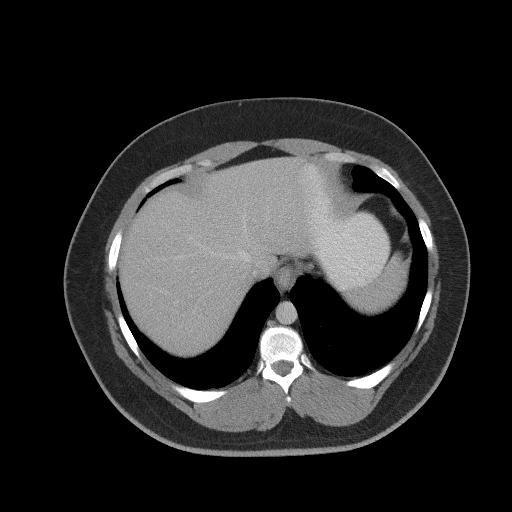
[im 92/97  soft-tissue]
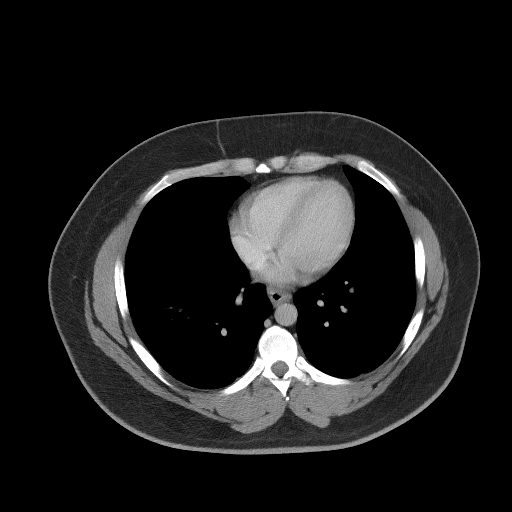

[Series 6: coronal st · coronal · 0.84mm/px · 3 of 117 slices shown]
[im 39/117  soft-tissue]
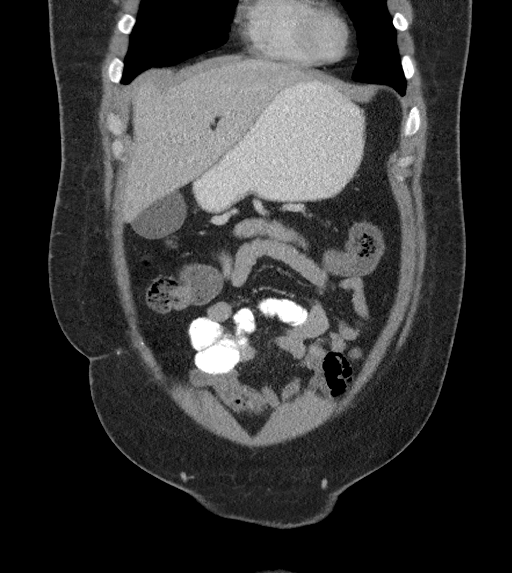
[im 52/117  soft-tissue]
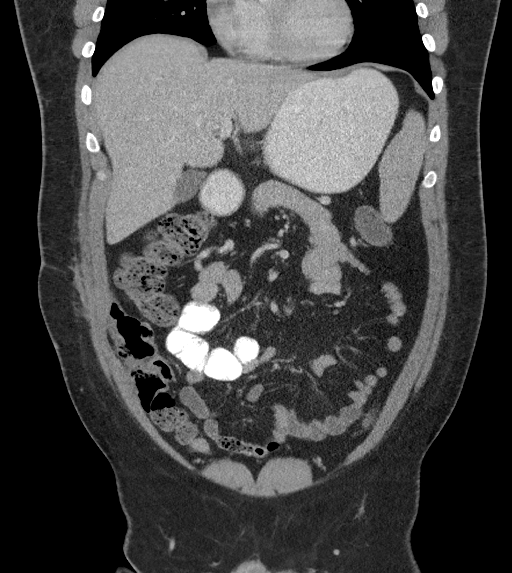
[im 65/117  soft-tissue]
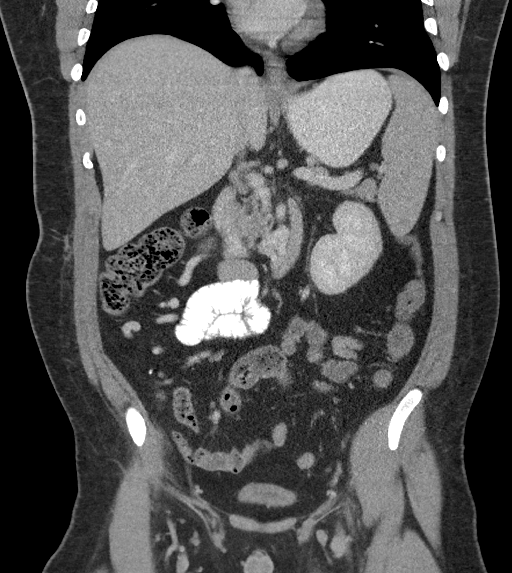

[16 of 46 positions shown; findings below may reference images not displayed]

FINDINGS: Lower chest: Minimal right greater than left lower lobe atelectasis.
No pleural fluid or consolidation.

Hepatobiliary: No focal liver abnormality is seen. No gallstones,
gallbladder wall thickening, or biliary dilatation.

Pancreas: No ductal dilatation or inflammation.

Spleen: Borderline splenomegaly with spleen spanning 13.7 cm. No
focal abnormality.

Adrenals/Urinary Tract: Normal adrenal glands. No hydronephrosis or
perinephric edema. Homogeneous renal enhancement. Urinary bladder is
partially distended without wall thickening.

Stomach/Bowel: Mild distal esophageal wall thickening as before.
There is a small paraesophageal lymph node that is new. No gastric
wall thickening. No small bowel dilatation, inflammation, or
obstruction. Prior appendectomy. Mild colonic wall thickening of the
splenic flexure and descending colon. No significant diverticular
change.

Vascular/Lymphatic: As seen on prior exam the superior mesenteric
vein is absent, presumably chronically thrombosed. Multiple
mesenteric collaterals/varices the central mesentery, with multiple
venous arcades. Portal and splenic vein remain patent. Normal
abdominal aorta. No adenopathy.

Reproductive: Prostate is unremarkable.

Other: Similar-appearing patchy soft tissue densities in the right
lateral subcutaneous tissues compared with prior exam. No free air
free fluid. No intra-abdominal abscess.

Musculoskeletal: There are no acute or suspicious osseous
abnormalities.
IMPRESSION: 1. Mild colonic wall thickening of the splenic flexure and
descending colon consistent with colitis, may be infectious or
inflammatory.
2. Distal esophageal wall thickening as before, may be esophagitis
or reflux. Small paraesophageal node is new, likely reactive.
3. Chronic occlusion of the superior mesenteric vein with multiple
mesenteric collaterals/varices.

## 2018-01-29 MED ORDER — HYDROMORPHONE HCL 1 MG/ML IJ SOLN
1.0000 mg | Freq: Once | INTRAMUSCULAR | Status: AC
  Administered 2018-01-29: 1 mg via INTRAVENOUS
  Filled 2018-01-29: qty 1

## 2018-01-29 MED ORDER — ONDANSETRON HCL 4 MG/2ML IJ SOLN
4.0000 mg | Freq: Once | INTRAMUSCULAR | Status: AC
  Administered 2018-01-29: 4 mg via INTRAVENOUS
  Filled 2018-01-29: qty 2

## 2018-01-29 MED ORDER — ONDANSETRON HCL 4 MG/2ML IJ SOLN
INTRAMUSCULAR | Status: AC
  Administered 2018-01-29: 4 mg via INTRAVENOUS
  Filled 2018-01-29: qty 2

## 2018-01-29 MED ORDER — IOPAMIDOL (ISOVUE-300) INJECTION 61%
100.0000 mL | Freq: Once | INTRAVENOUS | Status: AC | PRN
  Administered 2018-01-29: 100 mL via INTRAVENOUS

## 2018-01-29 MED ORDER — MORPHINE SULFATE (PF) 4 MG/ML IV SOLN
6.0000 mg | Freq: Once | INTRAVENOUS | Status: AC
  Administered 2018-01-29: 4 mg via INTRAVENOUS

## 2018-01-29 MED ORDER — TRAMADOL HCL 50 MG PO TABS: 50.0000 mg | ORAL_TABLET | Freq: Four times a day (QID) | ORAL | 0 refills | Status: DC | PRN

## 2018-01-29 MED ORDER — MORPHINE SULFATE (PF) 4 MG/ML IV SOLN
INTRAVENOUS | Status: AC
  Filled 2018-01-29: qty 1

## 2018-01-29 MED ORDER — METRONIDAZOLE 500 MG PO TABS: 500.0000 mg | ORAL_TABLET | Freq: Three times a day (TID) | ORAL | 0 refills | Status: DC

## 2018-01-29 MED ORDER — CIPROFLOXACIN HCL 500 MG PO TABS: 500.0000 mg | ORAL_TABLET | Freq: Two times a day (BID) | ORAL | 0 refills | Status: DC

## 2018-01-29 MED ORDER — ONDANSETRON HCL 4 MG/2ML IJ SOLN
4.0000 mg | Freq: Once | INTRAMUSCULAR | Status: AC
  Administered 2018-01-29: 4 mg via INTRAVENOUS

## 2018-01-29 MED ORDER — MORPHINE SULFATE (PF) 2 MG/ML IV SOLN
INTRAVENOUS | Status: AC
  Administered 2018-01-29: 2 mg via INTRAVASCULAR
  Filled 2018-01-29: qty 1

## 2018-01-29 MED ORDER — CIPROFLOXACIN HCL 250 MG PO TABS
500.0000 mg | ORAL_TABLET | Freq: Once | ORAL | Status: AC
  Administered 2018-01-29: 500 mg via ORAL
  Filled 2018-01-29: qty 2

## 2018-01-29 MED ORDER — SODIUM CHLORIDE 0.9 % IV BOLUS
1000.0000 mL | Freq: Once | INTRAVENOUS | Status: AC
  Administered 2018-01-29: 1000 mL via INTRAVENOUS

## 2018-01-29 MED ORDER — METRONIDAZOLE 500 MG PO TABS
500.0000 mg | ORAL_TABLET | Freq: Once | ORAL | Status: AC
  Administered 2018-01-29: 500 mg via ORAL
  Filled 2018-01-29: qty 1

## 2018-01-29 NOTE — ED Notes (Signed)
Pt ambulatory to waiting room. Pt verbalized understanding of discharge instructions.   

## 2018-01-29 NOTE — ED Triage Notes (Signed)
Pt C/O LLQ pain that began " a few days ago." Pt states the pain is worse tonight. Pt reports N/V and denies diarrhea.

## 2018-01-29 NOTE — ED Provider Notes (Signed)
[facility] EMERGENCY DEPARTMENT Provider Note   CSN: [id number] Arrival date & time: 01/29/18  0044     History   Chief Complaint Chief Complaint  Patient presents with  . Abdominal Pain    HPI Herbert Walsh is a 27 y.o. male.  Patient presents to the emergency department with complaints of left lower quadrant abdominal pain.  He has had some slight pain over the last couple of days, however tonight he had onset of sharp stabbing pains.  He reports that it is a cramping nature as well.  He has not had any associated diarrhea or rectal bleeding, has had nausea and vomiting.  Patient reports a history of colitis and GI bleed years ago.  He is not sure if this feels similar.     Past Medical History:  Diagnosis Date  . Hypertension 2019    Patient Active Problem List   Diagnosis Date Noted  . PTSD (post-traumatic stress disorder) 11/01/2017    Past Surgical History:  Procedure Laterality Date  . APPENDECTOMY          Home Medications    Prior to Admission medications   Medication Sig Start Date End Date Taking? Authorizing Provider  atorvastatin (LIPITOR) 10 MG tablet Take 10 mg by mouth daily. 09/25/17   [provider]  ciprofloxacin (CIPRO) 500 MG tablet Take 1 tablet (500 mg total) by mouth 2 (two) times daily. 01/29/18   Gilda Crease, MD  citalopram (CELEXA) 40 MG tablet Take 1 tablet (40 mg total) by mouth daily. 12/06/17   Neysa Hotter, MD  cloNIDine (CATAPRES) 0.2 MG tablet Take 1 tablet (0.2 mg total) by mouth 2 (two) times daily. 12/06/17   Neysa Hotter, MD  metroNIDAZOLE (FLAGYL) 500 MG tablet Take 1 tablet (500 mg total) by mouth 3 (three) times daily. 01/29/18   Gilda Crease, MD  risperiDONE (RISPERDAL) 2 MG tablet 1 mg for one night, then 2 mg at night 11/01/17   Hisada, Barbee Cough, MD  risperiDONE (RISPERDAL) 3 MG tablet Take 1 tablet (3 mg total) by mouth at bedtime. 12/06/17   Neysa Hotter, MD  traMADol (ULTRAM) 50 MG tablet  Take 1 tablet (50 mg total) by mouth every 6 (six) hours as needed. 01/29/18   Gilda Crease, MD    Family History Family History  Problem Relation Age of Onset  . Alcohol abuse Mother   . Depression Mother   . Drug abuse Mother   . Physical abuse Mother   . Sexual abuse Mother   . Schizophrenia Mother   . ADD / ADHD Paternal Aunt   . Alcohol abuse Maternal Grandfather   . Bipolar disorder Maternal Grandmother   . Schizophrenia Maternal Grandmother   . Drug abuse Maternal Grandmother   . Alcohol abuse Paternal Grandfather   . Depression Paternal Grandfather   . Depression Paternal Grandmother   . Drug abuse Paternal Grandmother     Social History Social History   Tobacco Use  . Smoking status: Current Every Day Smoker    Packs/day: 0.50    Years: 11.00    Pack years: 5.50    Types: Cigarettes  . Smokeless tobacco: Never Used  Substance Use Topics  . Alcohol use: No  . Drug use: No     Allergies   Patient has no known allergies.   Review of Systems Review of Systems  Gastrointestinal: Positive for abdominal pain, nausea and vomiting.  All other systems reviewed and are negative.  Physical Exam Updated Vital Signs BP 104/64   Pulse (!) 106   Temp (!) 97.5 F (36.4 C) (Oral)   Resp 17   Ht 6' (1.829 m)   Wt 122.5 kg   SpO2 96%   BMI 36.62 kg/m   Physical Exam Vitals signs and nursing note reviewed.  Constitutional:      General: He is not in acute distress.    Appearance: Normal appearance. He is well-developed.  HENT:     Head: Normocephalic and atraumatic.     Right Ear: Hearing normal.     Left Ear: Hearing normal.     Nose: Nose normal.  Eyes:     Conjunctiva/sclera: Conjunctivae normal.     Pupils: Pupils are equal, round, and reactive to light.  Neck:     Musculoskeletal: Normal range of motion and neck supple.  Cardiovascular:     Rate and Rhythm: Regular rhythm.     Heart sounds: S1 normal and S2 normal. No murmur. No  friction rub. No gallop.   Pulmonary:     Effort: Pulmonary effort is normal. No respiratory distress.     Breath sounds: Normal breath sounds.  Chest:     Chest wall: No tenderness.  Abdominal:     General: Bowel sounds are normal.     Palpations: Abdomen is soft.     Tenderness: There is abdominal tenderness in the left lower quadrant. There is no guarding or rebound. Negative signs include Murphy's sign and McBurney's sign.     Hernia: No hernia is present.  Musculoskeletal: Normal range of motion.  Skin:    General: Skin is warm and dry.     Findings: No rash.  Neurological:     Mental Status: He is alert and oriented to person, place, and time.     GCS: GCS eye subscore is 4. GCS verbal subscore is 5. GCS motor subscore is 6.     Cranial Nerves: No cranial nerve deficit.     Sensory: No sensory deficit.     Coordination: Coordination normal.  Psychiatric:        Speech: Speech normal.        Behavior: Behavior normal.        Thought Content: Thought content normal.      ED Treatments / Results  Labs (all labs ordered are listed, but only abnormal results are displayed) Labs Reviewed  CBC WITH DIFFERENTIAL/PLATELET - Abnormal; Notable for the following components:      Result Value   WBC 17.1 (*)    MCH 25.8 (*)    Neutro Abs 13.4 (*)    Monocytes Absolute 1.1 (*)    Abs Immature Granulocytes 0.08 (*)    All other components within normal limits  BASIC METABOLIC PANEL - Abnormal; Notable for the following components:   Glucose, Bld 137 (*)    Calcium 8.7 (*)    All other components within normal limits  URINALYSIS, ROUTINE W REFLEX MICROSCOPIC    EKG None  Radiology Ct Abdomen Pelvis W Contrast  Result Date: 01/29/2018 CLINICAL DATA:  Left lower quadrant pain. EXAM: CT ABDOMEN AND PELVIS WITH CONTRAST TECHNIQUE: Multidetector CT imaging of the abdomen and pelvis was performed using the standard protocol following bolus administration of intravenous contrast.  CONTRAST:  ISOVUE-300 IOPAMIDOL (ISOVUE-300) INJECTION 61% COMPARISON:  CT 12/19/2017, remote CT 10/02/2008 FINDINGS: Lower chest: Minimal right greater than left lower lobe atelectasis. No pleural fluid or consolidation. Hepatobiliary: No focal liver abnormality is seen.  No gallstones, gallbladder wall thickening, or biliary dilatation. Pancreas: No ductal dilatation or inflammation. Spleen: Borderline splenomegaly with spleen spanning 13.7 cm. No focal abnormality. Adrenals/Urinary Tract: Normal adrenal glands. No hydronephrosis or perinephric edema. Homogeneous renal enhancement. Urinary bladder is partially distended without wall thickening. Stomach/Bowel: Mild distal esophageal wall thickening as before. There is a small paraesophageal lymph node that is new. No gastric wall thickening. No small bowel dilatation, inflammation, or obstruction. Prior appendectomy. Mild colonic wall thickening of the splenic flexure and descending colon. No significant diverticular change. Vascular/Lymphatic: As seen on prior exam the superior mesenteric vein is absent, presumably chronically thrombosed. Multiple mesenteric collaterals/varices the central mesentery, with multiple venous arcades. Portal and splenic vein remain patent. Normal abdominal aorta. No adenopathy. Reproductive: Prostate is unremarkable. Other: Similar-appearing patchy soft tissue densities in the right lateral subcutaneous tissues compared with prior exam. No free air free fluid. No intra-abdominal abscess. Musculoskeletal: There are no acute or suspicious osseous abnormalities. IMPRESSION: 1. Mild colonic wall thickening of the splenic flexure and descending colon consistent with colitis, may be infectious or inflammatory. 2. Distal esophageal wall thickening as before, may be esophagitis or reflux. Small paraesophageal node is new, likely reactive. 3. 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  Electronically Signed   By: Narda Rutherford M.D.   On: 01/29/2018 05:00    Procedures Procedures (including critical care time)  Medications Ordered in ED Medications  sodium chloride 0.9 % bolus 1,000 mL (0 mLs Intravenous Stopped 01/29/18 0326)  morphine 4 MG/ML injection 6 mg (4 mg Intravenous Given 01/29/18 0140)  ondansetron (ZOFRAN) injection 4 mg (4 mg Intravenous Given 01/29/18 0140)  morphine 2 MG/ML injection (2 mg Intravascular Given 01/29/18 0140)  iopamidol (ISOVUE-300) 61 % injection 100 mL (100 mLs Intravenous Contrast Given 01/29/18 0342)  ondansetron (ZOFRAN) injection 4 mg (4 mg Intravenous Given 01/29/18 0418)  HYDROmorphone (DILAUDID) injection 1 mg (1 mg Intravenous Given 01/29/18 0533)  ciprofloxacin (CIPRO) tablet 500 mg (500 mg Oral Given 01/29/18 0534)  metroNIDAZOLE (FLAGYL) tablet 500 mg (500 mg Oral Given 01/29/18 0533)     Initial Impression / Assessment and Plan / ED Course  I have reviewed the triage vital signs and the nursing notes.  Pertinent labs & imaging results that were available during my care of the patient were reviewed by me and considered in my medical decision making (see chart for details).     Patient presents to the emergency department for evaluation of left lower quadrant abdominal pain.  Patient reports a history of colitis years ago.  Reviewing his records reveals he has had an ischemic colitis.  Work-up did not reveal a reason for this.  He had a full hypercoagulable work-up in 2010, results reviewed, panel was negative.  Patient's CT scan today does show recurrent colitis, etiology unclear.  Patient aggressively hydrated in the event that this is ischemic colitis, also will be initiated on Cipro and Flagyl.  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  This has been seen on multiple CTs as far back as 2010.  He had colonoscopy in 2012 by Dr. Matthias Hughs, who noted the absence of the SMV and felt that it was related to his ruptured  appendicitis in 2006.  He has been seen by multiple GI doctors and most recently had a CAT scan by Dr. Magnus Ivan, general surgery a month ago.  He has never been recommended specific treatment for this absence of SMV or chronic thrombosis, including no  need for anticoagulation.  Patient will be treated for nonspecific colitis, follow-up with gastroenterology.  Patient was given return precautions.  Final Clinical Impressions(s) / ED Diagnoses   Final diagnoses:  Colitis    ED Discharge Orders         Ordered    ciprofloxacin (CIPRO) 500 MG tablet  2 times daily     01/29/18 0505    metroNIDAZOLE (FLAGYL) 500 MG tablet  3 times daily     01/29/18 0505    traMADol (ULTRAM) 50 MG tablet  Every 6 hours PRN     01/29/18 0505           Gilda Crease, MD 01/29/18 [phone]

## 2018-02-19 ENCOUNTER — Ambulatory Visit: Payer: BLUE CROSS/BLUE SHIELD | Admitting: Psychology
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
## 2018-03-11 ENCOUNTER — Ambulatory Visit (HOSPITAL_COMMUNITY): Payer: BLUE CROSS/BLUE SHIELD | Admitting: Psychiatry

## 2018-03-11 ENCOUNTER — Encounter (HOSPITAL_COMMUNITY): Payer: Self-pay | Admitting: Psychiatry

## 2018-03-11 ENCOUNTER — Telehealth (HOSPITAL_COMMUNITY): Payer: Self-pay | Admitting: *Deleted

## 2018-03-11 ENCOUNTER — Telehealth (HOSPITAL_COMMUNITY): Payer: Self-pay | Admitting: Psychiatry

## 2018-03-11 VITALS — BP 124/82 | HR 109 | Ht 72.0 in | Wt 268.0 lb

## 2018-03-11 DIAGNOSIS — F4310 Post-traumatic stress disorder, unspecified: Secondary | ICD-10-CM | POA: Diagnosis not present

## 2018-03-11 DIAGNOSIS — F1910 Other psychoactive substance abuse, uncomplicated: Secondary | ICD-10-CM

## 2018-03-11 MED ORDER — LITHIUM CARBONATE ER 450 MG PO TBCR: 450.0000 mg | EXTENDED_RELEASE_TABLET | Freq: Every day | ORAL | 0 refills | Status: DC

## 2018-03-11 MED ORDER — RISPERIDONE 4 MG PO TABS: 4.0000 mg | ORAL_TABLET | Freq: Every day | ORAL | 0 refills | Status: DC

## 2018-03-11 NOTE — Telephone Encounter (Signed)
Could you inform the patient that PHP program will not be able to offer service to him. Ask him if he is interested in referral to [facility] (chemical dependency program) for dextromethorphan use. Otherwise, I am thinking of holding referral until he completes that appointment with Dr. Reggy Eye (psychologist) for further evaluation. Please remind him to contact their clinic for appointment [phone] ).

## 2018-03-11 NOTE — Telephone Encounter (Signed)
Dr Vanetta Shawl  Patient interested in referral for [facility] & gave Dr Reggy Eye #

## 2018-03-11 NOTE — Telephone Encounter (Signed)
Discussed with the pharmacy, who informed that he is taking lisinopril 10 mg and is concerned of potential side effect of lithium toxicity. I believe it is still safe to start lithium; will obtain levels in five days.

## 2018-03-11 NOTE — Telephone Encounter (Signed)
I made referral to [facility]. Will wait their review.

## 2018-03-11 NOTE — Addendum Note (Signed)
Addended by: Neysa Hotter on: 03/11/2018 04:45 PM   Modules accepted: Orders

## 2018-03-11 NOTE — Telephone Encounter (Signed)
Dr Trudie Buckler pharmacist asked for a callback to Clarify script # [id number]

## 2018-03-11 NOTE — Patient Instructions (Addendum)
1. Increase risperidone 4 mg at night  2. Start lithium 450 mg at night  3. Check BMP, lithium, five days after starting lithium 4. Continue citalopram 40 mg daily   5. Continue clonidine 0.2 mg twice a day 6. Contact for therapy [facility] [phone]  7. Return to clinic in one month for 30 mins

## 2018-03-12 ENCOUNTER — Telehealth (HOSPITAL_COMMUNITY): Payer: Self-pay | Admitting: Psychiatry

## 2018-03-12 NOTE — Telephone Encounter (Signed)
D:  Dr. Vanetta Shawl referred pt to [facility].  A:  Placed call to pt; left vm for him to call writer back.  Inform Dr. Vanetta Shawl.

## 2018-03-14 ENCOUNTER — Other Ambulatory Visit (HOSPITAL_COMMUNITY): Payer: Self-pay | Admitting: Psychiatry

## 2018-03-14 ENCOUNTER — Telehealth (HOSPITAL_COMMUNITY): Payer: Self-pay | Admitting: *Deleted

## 2018-03-14 ENCOUNTER — Telehealth (HOSPITAL_COMMUNITY): Payer: Self-pay | Admitting: Psychiatry

## 2018-03-14 DIAGNOSIS — R454 Irritability and anger: Secondary | ICD-10-CM

## 2018-03-14 NOTE — Telephone Encounter (Signed)
Dr Vanetta Shawl Patient called requesting to speak with you before you leave today. He stated he has a lot going on. # [phone]

## 2018-03-14 NOTE — Telephone Encounter (Signed)
Discussed with the patient. He will contact the front desk to make schedule for therapy and sooner follow up with me (he prefers to have this). Will make referral to neuropsychology evaluation. Noted that PHP, [facility] referral was declined by the program.

## 2018-03-21 LAB — BASIC METABOLIC PANEL
BUN: 9 mg/dL (ref 7–25)
CHLORIDE: 101 mmol/L (ref 98–110)
CO2: 23 mmol/L (ref 20–32)
Calcium: 9 mg/dL (ref 8.6–10.3)
Creat: 0.93 mg/dL (ref 0.60–1.35)
Glucose, Bld: 166 mg/dL — ABNORMAL HIGH (ref 65–139)
Potassium: 3.7 mmol/L (ref 3.5–5.3)
Sodium: 135 mmol/L (ref 135–146)

## 2018-03-21 LAB — LITHIUM LEVEL: Lithium Lvl: 0.4 mmol/L — ABNORMAL LOW (ref 0.6–1.2)

## 2018-03-25 ENCOUNTER — Encounter: Payer: Self-pay | Admitting: Psychology

## 2018-04-02 ENCOUNTER — Telehealth (HOSPITAL_COMMUNITY): Payer: Self-pay | Admitting: Psychology
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
## 2018-04-11 ENCOUNTER — Encounter (HOSPITAL_COMMUNITY): Payer: Self-pay | Admitting: Psychiatry

## 2018-04-11 ENCOUNTER — Ambulatory Visit (INDEPENDENT_AMBULATORY_CARE_PROVIDER_SITE_OTHER): Payer: BLUE CROSS/BLUE SHIELD | Admitting: Psychiatry

## 2018-04-11 VITALS — BP 141/95 | HR 121 | Ht 72.0 in | Wt 271.0 lb

## 2018-04-11 DIAGNOSIS — F4310 Post-traumatic stress disorder, unspecified: Secondary | ICD-10-CM | POA: Diagnosis not present

## 2018-04-11 DIAGNOSIS — F1910 Other psychoactive substance abuse, uncomplicated: Secondary | ICD-10-CM | POA: Diagnosis not present

## 2018-04-11 MED ORDER — RISPERIDONE 4 MG PO TABS: 4.0000 mg | ORAL_TABLET | Freq: Every day | ORAL | 0 refills | Status: DC

## 2018-04-11 MED ORDER — CITALOPRAM HYDROBROMIDE 40 MG PO TABS: 40.0000 mg | ORAL_TABLET | Freq: Every day | ORAL | 0 refills | Status: DC

## 2018-04-11 MED ORDER — CLONIDINE HCL 0.2 MG PO TABS: 0.2000 mg | ORAL_TABLET | Freq: Two times a day (BID) | ORAL | 0 refills | Status: DC

## 2018-04-11 MED ORDER — LITHIUM CARBONATE ER 450 MG PO TBCR: 450.0000 mg | EXTENDED_RELEASE_TABLET | Freq: Every day | ORAL | 0 refills | Status: DC

## 2018-04-11 NOTE — Patient Instructions (Addendum)
1. Continuerisperidone 4mg  at night  2. Reinitiate lithium 450 mg at night  3. Continue citalopram 40 mg daily  5. Continue clonidine 0.2 mg twice a day 6. Return to clinic inone month for 30 mins Contact family service of [place] for group

## 2018-04-30 ENCOUNTER — Encounter (HOSPITAL_COMMUNITY): Payer: Self-pay | Admitting: Psychiatry

## 2018-04-30 ENCOUNTER — Other Ambulatory Visit: Payer: Self-pay

## 2018-04-30 ENCOUNTER — Ambulatory Visit (INDEPENDENT_AMBULATORY_CARE_PROVIDER_SITE_OTHER): Payer: BLUE CROSS/BLUE SHIELD | Admitting: Psychiatry

## 2018-04-30 DIAGNOSIS — F4310 Post-traumatic stress disorder, unspecified: Secondary | ICD-10-CM | POA: Diagnosis not present
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
## 2018-05-09 ENCOUNTER — Telehealth (HOSPITAL_COMMUNITY): Payer: Self-pay | Admitting: *Deleted

## 2018-05-09 ENCOUNTER — Other Ambulatory Visit (HOSPITAL_COMMUNITY): Payer: Self-pay | Admitting: Psychiatry

## 2018-05-09 ENCOUNTER — Ambulatory Visit (HOSPITAL_COMMUNITY): Payer: BLUE CROSS/BLUE SHIELD | Admitting: Psychiatry

## 2018-05-09 MED ORDER — CITALOPRAM HYDROBROMIDE 40 MG PO TABS: 40.0000 mg | ORAL_TABLET | Freq: Every day | ORAL | 0 refills | Status: DC

## 2018-05-09 MED ORDER — LITHIUM CARBONATE ER 450 MG PO TBCR: 450.0000 mg | EXTENDED_RELEASE_TABLET | Freq: Every day | ORAL | 0 refills | Status: DC

## 2018-05-09 MED ORDER — CLONIDINE HCL 0.2 MG PO TABS: 0.2000 mg | ORAL_TABLET | Freq: Two times a day (BID) | ORAL | 0 refills | Status: DC

## 2018-05-09 MED ORDER — RISPERIDONE 4 MG PO TABS: 4.0000 mg | ORAL_TABLET | Freq: Every day | ORAL | 0 refills | Status: DC

## 2018-05-09 NOTE — Telephone Encounter (Signed)
D [place] Patient visit reschedule to Wednesday 05-15-2018. Patient requested refills states he's about out . Not enough to last until Wednesday

## 2018-05-09 NOTE — Telephone Encounter (Signed)
ordered
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
## 2018-05-15 ENCOUNTER — Other Ambulatory Visit: Payer: Self-pay

## 2018-05-15 ENCOUNTER — Ambulatory Visit (INDEPENDENT_AMBULATORY_CARE_PROVIDER_SITE_OTHER): Payer: BLUE CROSS/BLUE SHIELD | Admitting: Psychiatry

## 2018-05-15 ENCOUNTER — Encounter (HOSPITAL_COMMUNITY): Payer: Self-pay | Admitting: Psychiatry

## 2018-05-15 DIAGNOSIS — R454 Irritability and anger: Secondary | ICD-10-CM

## 2018-05-15 DIAGNOSIS — F4310 Post-traumatic stress disorder, unspecified: Secondary | ICD-10-CM

## 2018-05-15 DIAGNOSIS — Z79899 Other long term (current) drug therapy: Secondary | ICD-10-CM

## 2018-05-15 DIAGNOSIS — F1910 Other psychoactive substance abuse, uncomplicated: Secondary | ICD-10-CM

## 2018-05-15 MED ORDER — LITHIUM CARBONATE ER 300 MG PO TBCR: 600.0000 mg | EXTENDED_RELEASE_TABLET | Freq: Every day | ORAL | 1 refills | Status: DC

## 2018-05-15 MED ORDER — RISPERIDONE 4 MG PO TABS: 4.0000 mg | ORAL_TABLET | Freq: Every day | ORAL | 0 refills | Status: DC

## 2018-05-15 NOTE — Patient Instructions (Signed)
1.Continuerisperidone4mg  at night 2.Increase lithium 600 mg at night  3. Obtain blood test, five days after increasing lithium  4.Continue citalopram 40 mg daily 5. Increase clonidine 0.2 mg three times a day  6.Return to clinic inone month, 4/30 at 9:30 for 30 mins

## 2018-05-27 ENCOUNTER — Other Ambulatory Visit (HOSPITAL_COMMUNITY): Payer: Self-pay | Admitting: Psychiatry

## 2018-05-28 LAB — BASIC METABOLIC PANEL WITH GFR
BUN: 10 mg/dL (ref 7–25)
CO2: 26 mmol/L (ref 20–32)
Calcium: 9 mg/dL (ref 8.6–10.3)
Chloride: 104 mmol/L (ref 98–110)
Creat: 0.82 mg/dL (ref 0.60–1.35)
GFR, Est African American: 138 mL/min/{1.73_m2} (ref >=60)
GFR, Est Non African American: 119 mL/min/{1.73_m2} (ref >=60)
Glucose, Bld: 112 mg/dL (ref 65–139)
Potassium: 4.1 mmol/L (ref 3.5–5.3)
Sodium: 136 mmol/L (ref 135–146)

## 2018-05-28 LAB — LITHIUM LEVEL: Lithium Lvl: 0.4 mmol/L — ABNORMAL LOW (ref 0.6–1.2)

## 2018-05-30 ENCOUNTER — Telehealth (HOSPITAL_COMMUNITY): Payer: Self-pay | Admitting: Psychiatry

## 2018-05-30 ENCOUNTER — Other Ambulatory Visit: Payer: Self-pay

## 2018-05-30 ENCOUNTER — Ambulatory Visit (HOSPITAL_COMMUNITY): Payer: BLUE CROSS/BLUE SHIELD | Admitting: Psychiatry

## 2018-05-30 NOTE — Telephone Encounter (Signed)
Therapist attempted to contact patient via home phone and mobile phone, received voice mail response for each phone, left message indicating therapist's attempt to contact for scheduled appointment.
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
## 2018-06-13 ENCOUNTER — Encounter (HOSPITAL_COMMUNITY): Payer: Self-pay | Admitting: Psychiatry

## 2018-06-13 ENCOUNTER — Other Ambulatory Visit: Payer: Self-pay

## 2018-06-13 ENCOUNTER — Ambulatory Visit (HOSPITAL_COMMUNITY): Payer: BLUE CROSS/BLUE SHIELD | Admitting: Psychiatry

## 2018-06-13 ENCOUNTER — Ambulatory Visit (INDEPENDENT_AMBULATORY_CARE_PROVIDER_SITE_OTHER): Payer: BLUE CROSS/BLUE SHIELD | Admitting: Psychiatry

## 2018-06-13 DIAGNOSIS — F4310 Post-traumatic stress disorder, unspecified: Secondary | ICD-10-CM | POA: Diagnosis not present

## 2018-06-13 DIAGNOSIS — R454 Irritability and anger: Secondary | ICD-10-CM

## 2018-06-13 MED ORDER — CLONIDINE HCL 0.2 MG PO TABS: 0.2000 mg | ORAL_TABLET | Freq: Three times a day (TID) | ORAL | 0 refills | Status: DC

## 2018-06-13 MED ORDER — CLONIDINE HCL 0.2 MG PO TABS: 0.2000 mg | ORAL_TABLET | Freq: Two times a day (BID) | ORAL | 0 refills | Status: DC

## 2018-06-13 MED ORDER — RISPERIDONE 4 MG PO TABS: 4.0000 mg | ORAL_TABLET | Freq: Every day | ORAL | 0 refills | Status: DC

## 2018-06-13 MED ORDER — LITHIUM CARBONATE ER 300 MG PO TBCR: 600.0000 mg | EXTENDED_RELEASE_TABLET | Freq: Every day | ORAL | 0 refills | Status: DC

## 2018-06-13 MED ORDER — CITALOPRAM HYDROBROMIDE 40 MG PO TABS: 40.0000 mg | ORAL_TABLET | Freq: Every day | ORAL | 0 refills | Status: DC

## 2018-06-13 NOTE — Patient Instructions (Signed)
1.Continuerisperidone4mg  at night 2.Continue lithium 600 mg at night  3. Continue citalopram 40 mg daily 5. Continue clonidine 0.2 mg three times a day  6.Next appointment: 6/2 at 1:30 for 30 mins, video [email or web address]

## 2018-06-14 ENCOUNTER — Encounter

## 2018-06-14 ENCOUNTER — Encounter: Payer: BLUE CROSS/BLUE SHIELD | Attending: Psychology | Admitting: Psychology

## 2018-06-14 DIAGNOSIS — F429 Obsessive-compulsive disorder, unspecified: Secondary | ICD-10-CM | POA: Diagnosis present

## 2018-06-14 DIAGNOSIS — F419 Anxiety disorder, unspecified: Secondary | ICD-10-CM | POA: Insufficient documentation

## 2018-06-14 DIAGNOSIS — Z8669 Personal history of other diseases of the nervous system and sense organs: Secondary | ICD-10-CM | POA: Diagnosis present

## 2018-06-14 DIAGNOSIS — F6381 Intermittent explosive disorder: Secondary | ICD-10-CM | POA: Insufficient documentation

## 2018-06-14 DIAGNOSIS — F329 Major depressive disorder, single episode, unspecified: Secondary | ICD-10-CM | POA: Diagnosis present

## 2018-06-14 DIAGNOSIS — F4310 Post-traumatic stress disorder, unspecified: Secondary | ICD-10-CM | POA: Diagnosis not present

## 2018-06-14 DIAGNOSIS — F639 Impulse disorder, unspecified: Secondary | ICD-10-CM | POA: Diagnosis present

## 2018-06-14 DIAGNOSIS — Z87898 Personal history of other specified conditions: Secondary | ICD-10-CM

## 2018-06-24 ENCOUNTER — Other Ambulatory Visit: Payer: Self-pay

## 2018-06-24 ENCOUNTER — Telehealth (HOSPITAL_COMMUNITY): Payer: Self-pay | Admitting: Psychiatry

## 2018-06-24 ENCOUNTER — Ambulatory Visit (HOSPITAL_COMMUNITY): Payer: BLUE CROSS/BLUE SHIELD | Admitting: Psychiatry

## 2018-06-24 NOTE — Telephone Encounter (Signed)
Therapist called patient for scheduled appointment. Patient reported he was not prepared for appointment and requested to reschedule. Therapist asked patient to call office staff to reschedule and  reminded patient of attendance policy.

## 2018-07-16 ENCOUNTER — Ambulatory Visit (HOSPITAL_COMMUNITY): Payer: BLUE CROSS/BLUE SHIELD | Admitting: Psychiatry

## 2018-07-18 ENCOUNTER — Encounter: Payer: Self-pay | Admitting: Psychology

## 2018-07-18 ENCOUNTER — Other Ambulatory Visit: Payer: Self-pay

## 2018-07-18 ENCOUNTER — Encounter: Payer: BC Managed Care – PPO | Attending: Psychology | Admitting: Psychology

## 2018-07-18 DIAGNOSIS — Z8669 Personal history of other diseases of the nervous system and sense organs: Secondary | ICD-10-CM | POA: Diagnosis present

## 2018-07-18 DIAGNOSIS — F429 Obsessive-compulsive disorder, unspecified: Secondary | ICD-10-CM

## 2018-07-18 DIAGNOSIS — F329 Major depressive disorder, single episode, unspecified: Secondary | ICD-10-CM | POA: Insufficient documentation

## 2018-07-18 DIAGNOSIS — F419 Anxiety disorder, unspecified: Secondary | ICD-10-CM | POA: Insufficient documentation

## 2018-07-18 DIAGNOSIS — F4312 Post-traumatic stress disorder, chronic: Secondary | ICD-10-CM | POA: Diagnosis not present

## 2018-07-18 DIAGNOSIS — Z87898 Personal history of other specified conditions: Secondary | ICD-10-CM

## 2018-07-18 DIAGNOSIS — F6381 Intermittent explosive disorder: Secondary | ICD-10-CM | POA: Insufficient documentation

## 2018-07-18 DIAGNOSIS — F639 Impulse disorder, unspecified: Secondary | ICD-10-CM

## 2018-07-18 DIAGNOSIS — F32A Depression, unspecified: Secondary | ICD-10-CM

## 2018-07-18 NOTE — Progress Notes (Signed)
Patient:  Herbert Walsh   DOB: 1990-06-26  MR Number: [id number]  [facility] [address] [id number] [facility] [place] [id number] Dept: [phone]  Start: 3 PM End: 4 PM  Provider/Observer:     Hershal CoriaJohn R Bari Leib PsyD  Chief Complaint:      Chief Complaint  Patient presents with  . Agitation  . Anxiety  . Depression  . Post-Traumatic Stress Disorder  . Other    History of substance abuse and aggressive behaviors    Reason For Service:     Herbert Walsh is a 28 year old male referred by Dr. Vanetta ShawlHisada for psychological/neuropsychological testing to facilitate diagnostic considerations.  The patient has been followed by Dr. Zachary GeorgeSatteson's September 2019 because of agitation, anger issues and impulse control issues.  The patient easily becomes upset and even violent.  He has been arrested multiple times for violence in the past.  Symptoms also include difficulties with organization and following simple instructions, difficulty making friends, unable to maintain employment and abuse of dextromethorphan.  The patient does have a history of being diagnosed with seizure disorder as a child with a last seizure when he was 28 years old and being diagnosed with Tourette's syndrome between age of 89 through [age].  The patient has had significant psychiatric and psychological care through the years.  He has also been in group psychotherapy sessions and was described as a Animator"magnificent liar" since he was a child and has had many legal issues throughout the years.  The patient is described as becoming quite fixated and has obsessive-compulsive types of internal conversations with himself and becomes easily frustrated and fixated on others not respecting him.  The patient is described himself as having Asperger's syndrome.  Along with his anxiety and obsessive-compulsive symptoms the patient  also has significant problems with concentration and attention along with impulsivity.  The patient's mother reports that the patient has a hard time having relationship anyone but her.  The patient gets easily fed up and is very egocentric.  The patient's mother takes care of anything that he needs and that she is progressively, resentful and feels like he is too old for" this nonsense."  She reports that she cannot take care of her own needs and issues.  The patient's mother describes issues the patient had when he was a child and being diagnosed with Tourette's syndrome depression.  She reports her depression is always been a sideline issue but with age his depressive symptoms have increased.  She reports that his Tourette's syndrome symptoms began with facial tics and then increase to speech issues.  She reports that he had 2 small short seizures as a child and then when he was around 28 years old 28 year old he had seizures again.  The patient's mother reports that there is considerable variation in vacillation in his mood and that lithium has helped him with his impulsive moodiness in the past.  The patient had his appendix removed when he was 28 years old and between age 28 and [age] he had multiple drug overdoses.  At 23 he had acute colitis.  The patient has been involved with multiple counseling group and individual sessions.  The patient's mother reports that he has no desire for anything that others want to obtain.  His day-to-day life consist of sleeping mostly and being upset with everything in his life.  The patient has been unable to perform daily living  skills.  The patient tends to act as a child most of the time refusing to shower, clean his living space or have respect for others people's faces.  The patient is described to sleep almost constantly.  He has a very large appetite and will eat constantly when he is awake.  The patient has significant problems with focusing on bad memories and has  constant reminder of difficulties.  The patient's father refuses to work with him because the patient is so difficult to be around.  The patient's mother is described as the only family member that will have anything to do with him working there with him to help him.  She describes significant episodes of mood swings.  Testing Administered:  The patient completed the [place] multiphasic personality inventory-II.  Participation Level:   Active  Participation Quality:  Appropriate      Behavioral Observation:  Well Groomed, Alert, and Appropriate.   Test Results:   The patient appeared to answer many of the questions to an extreme level of pathology throughout this measure.  However given the patient's history it is likely that this does not automatically suggest the resulting clinical scales are invalid but the patient does have a strong propensity to acknowledge significant difficulties and problems in almost all aspects of his life.  The resulting basic/clinical scales show significant elevations for issues related to health concerns and problems with both vague and specific health issues, anxiety, difficulty managing and controlling his own thoughts and emotions and intrusive almost hallucinatory thoughts and feelings.  His greatest clinical feature had to do with an extreme level of anger and frustration particularly with individuals he sees is having power control in his life.  Further analysis utilizing content scales highlight these anxiety and depressive symptoms as well as significant health concerns.  The patient is having significant issues with family discord and problems getting along with family as well as other individuals.  The patient shows significant issues with adjustment in life and also has significant elevations on both of the PTSD scales on this test battery.  The patient's depressive symptoms on this inventory fall almost exclusively with subjective symptoms of depression  including feelings of sadness, hopelessness and helplessness along with physical malfunction and cognitive/dullness of thinking.  The patient also has a very significant elevation on brooding/intrusive thinking as well as feelings of malaise and fatigue.  Family discord and problems with authority are quite prevalent in his profile.  The patient feels alienated from himself and has difficulty not feeling [place] ideations and feeling like other people are after him are out to get him.  The patient feels socially isolated and alienated from others and has an inability to manage mastery over his thoughts and feelings.  Summary of Results:   Overall, the results of the current psychological evaluation do suggest significant pathology.  The patient had seizures as a child as well as a young teenager.  The patient was diagnosed with Tourette's syndrome with initial motor tics but also vocalizations and speech issues.  The patient has had extensive experiences of his perception of abuse or trauma and has an overwhelming amount of anger and frustration towards those in authority or those he feels are in control of him.  The patient has significant intrusive thoughts and inability to control his thoughts and feelings.  However, the symptoms date well before even his teen years and this does not appear to be consistent with a schizoaffective or schizophrenic type or profile.  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  The patient clearly has significant depression and his substance abuse are likely ways of both trying to punish those who care for him as well as punishing himself along with simple attempts to escape from his frustration and agitation.  In any  event, I do think that we are dealing with a number of significant psychiatric and even possibly neurological issues.  His Tourette's syndrome, seizure disorder and other traumas when he was young including even potential very difficult birth may be having neurological effects and underpinnings.  This layered on top of significant personality disorder and extreme narcissism in conjunction with a lifelong experience of his difficulties and rumination/obsessions reinforcing his anger and frustration as people in his life essentially react negatively to his challenges which then reinforce his belief system about others being negative and critical of him.  I do think that it would be very helpful for him and important for him to continue to work with psychotropic interventions but I suspect that his underlying anger and frustration with people in authority will make it likely that he will not always be compliant with therapeutic interventions.  I also think it would be helpful for the patient to have some therapeutic interventions and continue the interventions that he is receiving from Florencia ReasonsPeggy Bynum for his PTSD and coping skills.  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 Neuropsychologist

## 2018-07-18 NOTE — Progress Notes (Addendum)
Neuropsychological Consultation   Patient:   Herbert Walsh   DOB:   [birth year]  MR Number:  [id number]  [facility] [address] [id number] [facility] [place] [id number] Dept: [phone]           Date of Service:   06/14/2018  Today's visit was an inpatient visit with the patient and his mother.  This was an in person visit and the end person time was 1 hour along with an additional hour for records review and report writing.  Start Time:   10 AM End Time:   12 PM  Provider/Observer:  Arley PhenixJohn , Psy.D.       Clinical Neuropsychologist       Billing Code/Service: [id number], [phone]  Chief Complaint:    Herbert Walsh is a 28 year old male referred by Dr. Vanetta ShawlHisada for psychological/neuropsychological testing to facilitate diagnostic considerations.  The patient has been followed by Dr. Zachary GeorgeSatteson's September 2019 because of agitation, anger issues and impulse control issues.  The patient easily becomes upset and even violent.  He has been arrested multiple times for violence in the past.  Symptoms also include difficulties with organization and following simple instructions, difficulty making friends, unable to maintain employment and abuse of dextromethorphan.  The patient does have a history of being diagnosed with seizure disorder as a child with a last seizure when he was 28 years old and being diagnosed with Tourette's syndrome between age of 89 through [age].  The patient is described as having depression, anxiety, anger management issues, addiction and addictive behaviors, aggression, and general discontent.  Reason for Service:  Herbert Walsh is a 28 year old male referred by Dr. Vanetta ShawlHisada for psychological/neuropsychological testing to facilitate diagnostic considerations.  The patient has been followed by Dr. Zachary GeorgeSatteson's September 2019 because of agitation, anger issues and  impulse control issues.  The patient easily becomes upset and even violent.  He has been arrested multiple times for violence in the past.  Symptoms also include difficulties with organization and following simple instructions, difficulty making friends, unable to maintain employment and abuse of dextromethorphan.  The patient does have a history of being diagnosed with seizure disorder as a child with a last seizure when he was 28 years old and being diagnosed with Tourette's syndrome between age of 89 through [age].  The patient has had significant psychiatric and psychological care through the years.  He has also been in group psychotherapy sessions and was described as a Animator"magnificent liar" since he was a child and has had many legal issues throughout the years.  The patient is described as becoming quite fixated and has obsessive-compulsive types of internal conversations with himself and becomes easily frustrated and fixated on others not respecting him.  The patient is described himself as having Asperger's syndrome.  Along with his anxiety and obsessive-compulsive symptoms the patient also has significant problems with concentration and attention along with impulsivity.  The patient's mother reports that the patient has a hard time having relationship anyone but her.  The patient gets easily fed up and is very egocentric.  The patient's mother takes care of anything that he needs and that she is progressively, resentful and feels like he is too old for" this nonsense."  She reports that she cannot take care of her own needs and issues.  The patient's mother describes issues the patient had when he was a child and being diagnosed  with Tourette's syndrome depression.  She reports her depression is always been a sideline issue but with age his depressive symptoms have increased.  She reports that his Tourette's syndrome symptoms began with facial tics and then increase to speech issues.  She reports that he  had 2 small short seizures as a child and then when he was around 11 years old 28 year old he had seizures again.  The patient's mother reports that there is considerable variation in vacillation in his mood and that lithium has helped him with his impulsive moodiness in the past.  The patient had his appendix removed when he was 28 years old and between age 3 and 74 he had multiple drug overdoses.  At 23 he had acute colitis.  The patient has been involved with multiple counseling group and individual sessions.  The patient's mother reports that he has no desire for anything that others want to obtain.  His day-to-day life consist of sleeping mostly and being upset with everything in his life.  The patient has been unable to perform daily living skills.  The patient tends to act as a child most of the time refusing to shower, clean his living space or have respect for others people's faces.  The patient is described to sleep almost constantly.  He has a very large appetite and will eat constantly when he is awake.  The patient has significant problems with focusing on bad memories and has constant reminder of difficulties.  The patient's father refuses to work with him because the patient is so difficult to be around.  The patient's mother is described as the only family member that will have anything to do with him working there with him to help him.  She describes significant episodes of mood swings.   Current Status:  The patient is described as having agitation, anger, depression, obsessive compulsive and intrusive thinking, difficulty with social interactions.  Reliability of Information: The information is derived from 1 hour face-to-face clinical interview with the patient as well as review of available medical records.  Behavioral Observation: Herbert Walsh  presents as a 28 y.o.-year-old Right Caucasian Male who appeared his stated age. his dress was Appropriate and he was Fairly Groomed and his  manners were Appropriate, inappropriate to the situation.  his participation was indicative of Intrusive and Monopolizing behaviors.  There were not any physical disabilities noted.  he displayed an inappropriate level of cooperation and motivation.     Interactions:    Active Redirectable  Attention:   abnormal and attention span appeared shorter than expected for age  Memory:   abnormal; remote memory intact, recent memory impaired  Visuo-spatial:  not examined  Speech (Volume):  normal  Speech:   normal; normal  Thought Process:  Circumstantial and Tangential  Though Content:  Rumination; not suicidal and not homicidal  Orientation:   person, place and time/date  Judgment:   Poor  Planning:   Poor  Affect:    Angry, Defensive and Irritable  Mood:    Dysphoric  Insight:   Lacking  Intelligence:   low  Marital Status/Living: The patient was born in [place] and has no siblings.  He was a breech birth.  He weighed 6 pounds 9 ounces at birth.  It was a complicated delivery.  The patient walked around 28 year of age and talked at 1-1/2-year of age and was generally early in initial developmental milestones.  The patient was diagnosed with Tourette's syndrome and seizures as a child.  He always had difficulty developing friends.  The patient is always had coordination issues and always tends to trip or have other issues with coordination.  The patient continues to live with his mother.  He has no significant friendships outside of his house.  The patient is not married but is separated.  He was married in August 2008 and they separated after 1 month.  Current Employment: The patient is unemployed and is never had any particular occupation.  He has never worked at a particular job for more than 6 months.  Past Employment:  The patient was in the [employer] between February 2008 and November 2013.  His highest rank was e- 3 and his rank at discharge was E1.  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  He received a less than honorable discharge.  The patient was discharged due to a period of time where he was AWOL after an issue where he felt like they failed to provide him with a correct rank and correct pay he got mad and just stopped going.  He was charged with conduct on becoming of the [employer].  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    Education:   HS Graduate  Medical History:   Past Medical History:  Diagnosis Date  . Hypertension 2019   Psychiatric History:  The patient has a long history of psychiatric illness and psychological/psychiatric issues.  These date back to his childhood.  He has been diagnosed with Tourette's syndrome, seizure disorder, depression, anxiety, under Y rituals psychological/psychiatric symptoms.  Family Med/Psych History:  Family History  Problem Relation Age of Onset  . Alcohol abuse Mother   . Depression Mother   . Drug abuse Mother   . Physical abuse Mother   . Sexual abuse Mother   . ADD / ADHD Paternal Aunt   . Alcohol abuse Maternal Grandfather   . Bipolar disorder Maternal Grandmother   . Schizophrenia Maternal Grandmother   . Drug abuse Maternal Grandmother   . Alcohol abuse Paternal Grandfather   . Depression Paternal Grandfather   . Depression Paternal Grandmother   . Drug abuse Paternal Grandmother     Risk of Suicide/Violence: moderate the patient has a long history of significant aggressive acts and even violence towards others.  He has not been able to establish friends or maintain any type of social or family relationships beyond his relationship with his mother and even these relationships can be quite abusive on his part.  Impression/DX:  Herbert Walsh is a 28 year old male referred by Dr. Vanetta Shawl for psychological/neuropsychological testing to facilitate diagnostic considerations.  The patient has been followed by  Dr. Zachary George September 2019 because of agitation, anger issues and impulse control issues.  The patient easily becomes upset and even violent.  He has been arrested multiple times for violence in the past.  Symptoms also include difficulties with organization and following simple instructions, difficulty making friends, unable to maintain employment and abuse of dextromethorphan.  The patient does have a history of being diagnosed with seizure disorder as a child with a last seizure when he was 59 years old and being diagnosed with Tourette's syndrome between age of 89 through 61.  The patient is described as having depression, anxiety, anger management issues, addiction and addictive behaviors, aggression, and general discontent.  Disposition/Plan:  We have asked the patient to complete the [place] multiphasic personality inventory as well as  some other inventories to assess the patient's psychological/psychiatric status we will combine that with clinical data from the formal clinical interview as well as review of his medical records.  

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
## 2018-07-30 ENCOUNTER — Ambulatory Visit (INDEPENDENT_AMBULATORY_CARE_PROVIDER_SITE_OTHER): Payer: BC Managed Care – PPO | Admitting: Psychiatry

## 2018-07-30 ENCOUNTER — Encounter (HOSPITAL_COMMUNITY): Payer: Self-pay | Admitting: Psychiatry

## 2018-07-30 ENCOUNTER — Other Ambulatory Visit: Payer: Self-pay

## 2018-07-30 DIAGNOSIS — F4310 Post-traumatic stress disorder, unspecified: Secondary | ICD-10-CM

## 2018-07-30 DIAGNOSIS — R454 Irritability and anger: Secondary | ICD-10-CM

## 2018-07-30 MED ORDER — LITHIUM CARBONATE ER 300 MG PO TBCR: EXTENDED_RELEASE_TABLET | ORAL | 0 refills | Status: DC

## 2018-07-30 MED ORDER — LITHIUM CARBONATE ER 450 MG PO TBCR: EXTENDED_RELEASE_TABLET | ORAL | 0 refills | Status: DC

## 2018-07-30 NOTE — Patient Instructions (Addendum)
1.Continuerisperidone4mg  at night 2.Increaselithium 750 mg (450 mg + 300 mg) at night  3. Continue citalopram 40 mg daily 5.Continueclonidine 0.2 mg three times a day 6.Next appointment: 7/13 at 3 PM  - check lithium five days after increasing lithium at [facility]

## 2018-08-05 ENCOUNTER — Telehealth (HOSPITAL_COMMUNITY): Payer: Self-pay | Admitting: *Deleted

## 2018-08-05 ENCOUNTER — Telehealth (HOSPITAL_COMMUNITY): Payer: Self-pay | Admitting: Psychiatry

## 2018-08-05 DIAGNOSIS — R454 Irritability and anger: Secondary | ICD-10-CM

## 2018-08-05 NOTE — Telephone Encounter (Signed)
DR HISADA  PATIENT CALLED & SAID THAT HIS INSURANCE WILL ONLY COVER LABS DRAWN @ [facility] ON [address] IN [place] -- PLEASE RESEND

## 2018-08-05 NOTE — Telephone Encounter (Signed)
ordered

## 2018-08-05 NOTE — Telephone Encounter (Signed)
[facility] orders [facility] to [facility] (TSH, BMP, lithium)

## 2018-08-06 ENCOUNTER — Other Ambulatory Visit (HOSPITAL_COMMUNITY): Payer: Self-pay | Admitting: Psychiatry

## 2018-08-07 ENCOUNTER — Encounter (HOSPITAL_COMMUNITY): Payer: Self-pay | Admitting: Psychiatry

## 2018-08-07 LAB — BASIC METABOLIC PANEL WITH GFR
BUN: 7 mg/dL (ref 7–25)
CO2: 27 mmol/L (ref 20–32)
Calcium: 9.5 mg/dL (ref 8.6–10.3)
Chloride: 103 mmol/L (ref 98–110)
Creat: 0.92 mg/dL (ref 0.60–1.35)
GFR, Est African American: 129 mL/min/{1.73_m2} (ref >=60)
GFR, Est Non African American: 111 mL/min/{1.73_m2} (ref >=60)
Glucose, Bld: 116 mg/dL (ref 65–139)
Potassium: 4.1 mmol/L (ref 3.5–5.3)
Sodium: 135 mmol/L (ref 135–146)

## 2018-08-07 LAB — TSH: TSH: 1.76 mIU/L (ref 0.40–4.50)

## 2018-08-07 LAB — LITHIUM LEVEL: Lithium Lvl: 0.4 mmol/L — ABNORMAL LOW (ref 0.6–1.2)
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
## 2018-08-26 ENCOUNTER — Encounter (HOSPITAL_COMMUNITY): Payer: Self-pay | Admitting: Psychiatry

## 2018-08-26 ENCOUNTER — Other Ambulatory Visit: Payer: Self-pay

## 2018-08-26 ENCOUNTER — Ambulatory Visit (INDEPENDENT_AMBULATORY_CARE_PROVIDER_SITE_OTHER): Payer: BC Managed Care – PPO | Admitting: Psychiatry

## 2018-08-26 DIAGNOSIS — F4310 Post-traumatic stress disorder, unspecified: Secondary | ICD-10-CM | POA: Diagnosis not present

## 2018-08-26 DIAGNOSIS — R454 Irritability and anger: Secondary | ICD-10-CM | POA: Diagnosis not present

## 2018-08-26 MED ORDER — LITHIUM CARBONATE ER 300 MG PO TBCR: EXTENDED_RELEASE_TABLET | ORAL | 0 refills | Status: DC

## 2018-08-26 MED ORDER — LITHIUM CARBONATE ER 450 MG PO TBCR: EXTENDED_RELEASE_TABLET | ORAL | 0 refills | Status: DC

## 2018-08-26 MED ORDER — CLONIDINE HCL 0.2 MG PO TABS: 0.2000 mg | ORAL_TABLET | Freq: Three times a day (TID) | ORAL | 0 refills | Status: DC

## 2018-08-26 NOTE — Patient Instructions (Signed)
1.Continuerisperidone4mg  at night 2.Continuelithium 750 mg at night 3.Continue citalopram 40 mg daily 5.Continueclonidine 0.2 mg three times a day 6.Next appointment: 8/25 at 2:20

## 2018-09-17 ENCOUNTER — Ambulatory Visit: Payer: BLUE CROSS/BLUE SHIELD | Admitting: Psychology
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
## 2018-10-08 ENCOUNTER — Other Ambulatory Visit: Payer: Self-pay

## 2018-10-08 ENCOUNTER — Ambulatory Visit (INDEPENDENT_AMBULATORY_CARE_PROVIDER_SITE_OTHER): Payer: BC Managed Care – PPO | Admitting: Psychiatry

## 2018-10-08 ENCOUNTER — Encounter (HOSPITAL_COMMUNITY): Payer: Self-pay | Admitting: Psychiatry

## 2018-10-08 DIAGNOSIS — F4310 Post-traumatic stress disorder, unspecified: Secondary | ICD-10-CM

## 2018-10-08 DIAGNOSIS — F6381 Intermittent explosive disorder: Secondary | ICD-10-CM | POA: Diagnosis not present

## 2018-10-08 MED ORDER — LITHIUM CARBONATE ER 450 MG PO TBCR: EXTENDED_RELEASE_TABLET | ORAL | 0 refills | Status: DC

## 2018-10-08 MED ORDER — LITHIUM CARBONATE ER 300 MG PO TBCR: EXTENDED_RELEASE_TABLET | ORAL | 0 refills | Status: DC

## 2018-10-08 MED ORDER — RISPERIDONE 4 MG PO TABS: 4.0000 mg | ORAL_TABLET | Freq: Every day | ORAL | 0 refills | Status: DC

## 2018-10-08 MED ORDER — CITALOPRAM HYDROBROMIDE 40 MG PO TABS: 40.0000 mg | ORAL_TABLET | Freq: Every day | ORAL | 0 refills | Status: DC

## 2018-10-08 NOTE — Patient Instructions (Signed)
1.Continuerisperidone4mg  at night 2.Continuelithium750mg  at night 3.Continue citalopram 40 mg daily 5.Continueclonidine 0.2 mg three times a day 6.Next appointment: 10/20 at 2 PM

## 2018-10-23 ENCOUNTER — Other Ambulatory Visit: Payer: Self-pay

## 2018-10-23 ENCOUNTER — Ambulatory Visit (INDEPENDENT_AMBULATORY_CARE_PROVIDER_SITE_OTHER): Payer: BC Managed Care – PPO | Admitting: Psychiatry

## 2018-10-23 ENCOUNTER — Encounter (HOSPITAL_COMMUNITY): Payer: Self-pay | Admitting: Psychiatry

## 2018-10-23 DIAGNOSIS — F4310 Post-traumatic stress disorder, unspecified: Secondary | ICD-10-CM | POA: Diagnosis not present
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
## 2018-12-03 ENCOUNTER — Ambulatory Visit (HOSPITAL_COMMUNITY): Payer: BC Managed Care – PPO | Admitting: Psychiatry
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
## 2018-12-10 ENCOUNTER — Encounter (HOSPITAL_COMMUNITY): Payer: Self-pay | Admitting: Psychiatry

## 2018-12-10 ENCOUNTER — Other Ambulatory Visit: Payer: Self-pay

## 2018-12-10 ENCOUNTER — Ambulatory Visit (INDEPENDENT_AMBULATORY_CARE_PROVIDER_SITE_OTHER): Payer: BC Managed Care – PPO | Admitting: Psychiatry

## 2018-12-10 DIAGNOSIS — F4310 Post-traumatic stress disorder, unspecified: Secondary | ICD-10-CM | POA: Diagnosis not present

## 2018-12-10 DIAGNOSIS — R454 Irritability and anger: Secondary | ICD-10-CM | POA: Diagnosis not present

## 2018-12-10 DIAGNOSIS — F6381 Intermittent explosive disorder: Secondary | ICD-10-CM | POA: Diagnosis not present

## 2018-12-10 MED ORDER — CLONIDINE HCL 0.2 MG PO TABS: 0.2000 mg | ORAL_TABLET | Freq: Three times a day (TID) | ORAL | 0 refills | Status: DC

## 2018-12-10 MED ORDER — LITHIUM CARBONATE ER 450 MG PO TBCR: EXTENDED_RELEASE_TABLET | ORAL | 0 refills | Status: DC

## 2018-12-10 MED ORDER — RISPERIDONE 4 MG PO TABS: 4.0000 mg | ORAL_TABLET | Freq: Every day | ORAL | 0 refills | Status: DC

## 2018-12-10 MED ORDER — TRAZODONE HCL 50 MG PO TABS: 75.0000 mg | ORAL_TABLET | Freq: Every day | ORAL | 0 refills | Status: DC

## 2018-12-10 MED ORDER — CITALOPRAM HYDROBROMIDE 40 MG PO TABS: 40.0000 mg | ORAL_TABLET | Freq: Every day | ORAL | 0 refills | Status: DC

## 2018-12-10 MED ORDER — LITHIUM CARBONATE ER 300 MG PO TBCR: EXTENDED_RELEASE_TABLET | ORAL | 0 refills | Status: DC

## 2018-12-10 NOTE — Addendum Note (Signed)
Addended by: [name] on: 12/10/2018 04:04 PM   Modules accepted: Orders

## 2018-12-10 NOTE — Patient Instructions (Signed)
1.Continue citalopram 40 mg daily 2.Continuerisperidone4mg  at night 3.Continuelithium750mg  at night 4. Continueclonidine 0.2 mg three times a day 6. Start Trazodone 75 mg daily  7.Next appointment: in January  8. Obtain blood test (lithium, kidney function)

## 2018-12-17 ENCOUNTER — Encounter (HOSPITAL_COMMUNITY): Payer: Self-pay | Admitting: Psychiatry

## 2018-12-17 LAB — BASIC METABOLIC PANEL
BUN: 7 mg/dL (ref 7–25)
CO2: 25 mmol/L (ref 20–32)
Calcium: 9.4 mg/dL (ref 8.6–10.3)
Chloride: 104 mmol/L (ref 98–110)
Creat: 0.98 mg/dL (ref 0.60–1.35)
Glucose, Bld: 181 mg/dL — ABNORMAL HIGH (ref 65–99)
Potassium: 4.4 mmol/L (ref 3.5–5.3)
Sodium: 138 mmol/L (ref 135–146)

## 2018-12-17 LAB — LITHIUM LEVEL: Lithium Lvl: 0.7 mmol/L (ref 0.6–1.2)

## 2018-12-25 ENCOUNTER — Other Ambulatory Visit: Payer: Self-pay

## 2018-12-25 ENCOUNTER — Ambulatory Visit (HOSPITAL_COMMUNITY): Payer: BC Managed Care – PPO | Admitting: Psychiatry

## 2019-01-27 DIAGNOSIS — D72828 Other elevated white blood cell count: Secondary | ICD-10-CM | POA: Insufficient documentation

## 2019-01-27 DIAGNOSIS — D649 Anemia, unspecified: Secondary | ICD-10-CM | POA: Insufficient documentation

## 2019-02-24 ENCOUNTER — Ambulatory Visit (HOSPITAL_COMMUNITY): Payer: BC Managed Care – PPO | Admitting: Psychiatry

## 2019-02-27 ENCOUNTER — Ambulatory Visit (HOSPITAL_COMMUNITY): Payer: BC Managed Care – PPO | Admitting: Psychiatry

## 2019-03-11 ENCOUNTER — Other Ambulatory Visit (HOSPITAL_COMMUNITY): Payer: Self-pay | Admitting: Psychiatry

## 2019-03-11 ENCOUNTER — Telehealth (HOSPITAL_COMMUNITY): Payer: Self-pay | Admitting: *Deleted

## 2019-03-11 DIAGNOSIS — K219 Gastro-esophageal reflux disease without esophagitis: Secondary | ICD-10-CM | POA: Insufficient documentation

## 2019-03-11 DIAGNOSIS — Z791 Long term (current) use of non-steroidal anti-inflammatories (NSAID): Secondary | ICD-10-CM | POA: Insufficient documentation

## 2019-03-11 DIAGNOSIS — K529 Noninfective gastroenteritis and colitis, unspecified: Secondary | ICD-10-CM | POA: Insufficient documentation

## 2019-03-11 MED ORDER — TRAZODONE HCL 50 MG PO TABS: 75.0000 mg | ORAL_TABLET | Freq: Every day | ORAL | 0 refills | Status: DC

## 2019-03-11 NOTE — Telephone Encounter (Signed)
PATIENT CALLED REQUEST REFILL   traZODone (DESYREL) 50 MG tablet Take 1.5 tablets (75 mg total) by mouth at bedtime. PER RX O REFILLS  0 ON FILE / HOLD

## 2019-03-11 NOTE — Telephone Encounter (Signed)
Sent. Make sure he f/u with dr Vanetta Shawl or Evelene Croon

## 2019-03-19 ENCOUNTER — Ambulatory Visit (INDEPENDENT_AMBULATORY_CARE_PROVIDER_SITE_OTHER): Payer: BC Managed Care – PPO | Admitting: Psychiatry

## 2019-03-19 ENCOUNTER — Other Ambulatory Visit: Payer: Self-pay

## 2019-03-19 ENCOUNTER — Encounter: Payer: Self-pay | Admitting: Psychiatry

## 2019-03-19 DIAGNOSIS — F6381 Intermittent explosive disorder: Secondary | ICD-10-CM | POA: Diagnosis not present

## 2019-03-19 DIAGNOSIS — R454 Irritability and anger: Secondary | ICD-10-CM | POA: Insufficient documentation

## 2019-03-19 DIAGNOSIS — F4310 Post-traumatic stress disorder, unspecified: Secondary | ICD-10-CM | POA: Diagnosis not present

## 2019-03-19 MED ORDER — LITHIUM CARBONATE ER 450 MG PO TBCR: EXTENDED_RELEASE_TABLET | ORAL | 0 refills | Status: DC

## 2019-03-19 MED ORDER — TRAZODONE HCL 150 MG PO TABS: 150.0000 mg | ORAL_TABLET | Freq: Every day | ORAL | 0 refills | Status: DC

## 2019-03-19 MED ORDER — RISPERIDONE 4 MG PO TABS: 4.0000 mg | ORAL_TABLET | Freq: Every day | ORAL | 0 refills | Status: DC

## 2019-03-19 MED ORDER — LITHIUM CARBONATE ER 300 MG PO TBCR: EXTENDED_RELEASE_TABLET | ORAL | 0 refills | Status: DC

## 2019-03-19 MED ORDER — CLONIDINE HCL 0.2 MG PO TABS: 0.2000 mg | ORAL_TABLET | Freq: Three times a day (TID) | ORAL | 0 refills | Status: DC

## 2019-03-19 MED ORDER — CITALOPRAM HYDROBROMIDE 40 MG PO TABS: 40.0000 mg | ORAL_TABLET | Freq: Every day | ORAL | 0 refills | Status: DC
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
## 2019-04-04 DIAGNOSIS — K621 Rectal polyp: Secondary | ICD-10-CM | POA: Insufficient documentation

## 2019-04-04 DIAGNOSIS — K635 Polyp of colon: Secondary | ICD-10-CM | POA: Insufficient documentation

## 2019-04-04 DIAGNOSIS — A048 Other specified bacterial intestinal infections: Secondary | ICD-10-CM | POA: Insufficient documentation
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
## 2019-05-01 ENCOUNTER — Ambulatory Visit (HOSPITAL_COMMUNITY): Payer: BC Managed Care – PPO | Admitting: Psychiatry

## 2019-05-05 DIAGNOSIS — Z789 Other specified health status: Secondary | ICD-10-CM | POA: Insufficient documentation

## 2019-05-05 DIAGNOSIS — Z720 Tobacco use: Secondary | ICD-10-CM | POA: Insufficient documentation

## 2019-05-07 ENCOUNTER — Ambulatory Visit (HOSPITAL_COMMUNITY): Payer: BC Managed Care – PPO | Admitting: Psychiatry

## 2019-05-07 ENCOUNTER — Other Ambulatory Visit: Payer: Self-pay
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
## 2019-05-08 ENCOUNTER — Ambulatory Visit (HOSPITAL_COMMUNITY): Payer: BC Managed Care – PPO | Admitting: Psychiatry

## 2019-05-12 ENCOUNTER — Other Ambulatory Visit: Payer: Self-pay

## 2019-05-12 ENCOUNTER — Encounter (HOSPITAL_COMMUNITY): Payer: Self-pay | Admitting: Psychiatry

## 2019-05-12 ENCOUNTER — Ambulatory Visit (INDEPENDENT_AMBULATORY_CARE_PROVIDER_SITE_OTHER): Payer: BC Managed Care – PPO | Admitting: Psychiatry

## 2019-05-12 DIAGNOSIS — F6381 Intermittent explosive disorder: Secondary | ICD-10-CM | POA: Diagnosis not present

## 2019-05-12 DIAGNOSIS — F4310 Post-traumatic stress disorder, unspecified: Secondary | ICD-10-CM | POA: Diagnosis not present

## 2019-05-12 MED ORDER — LITHIUM CARBONATE ER 300 MG PO TBCR: EXTENDED_RELEASE_TABLET | ORAL | 0 refills | Status: DC

## 2019-05-12 MED ORDER — RISPERIDONE 4 MG PO TABS: 4.0000 mg | ORAL_TABLET | Freq: Every day | ORAL | 0 refills | Status: DC

## 2019-05-12 MED ORDER — SERTRALINE HCL 50 MG PO TABS: ORAL_TABLET | ORAL | 1 refills | Status: DC

## 2019-05-12 MED ORDER — LITHIUM CARBONATE ER 450 MG PO TBCR: EXTENDED_RELEASE_TABLET | ORAL | 0 refills | Status: DC

## 2019-05-12 MED ORDER — CLONIDINE HCL 0.2 MG PO TABS: 0.2000 mg | ORAL_TABLET | Freq: Three times a day (TID) | ORAL | 0 refills | Status: DC

## 2019-05-12 MED ORDER — TRAZODONE HCL 150 MG PO TABS: 150.0000 mg | ORAL_TABLET | Freq: Every day | ORAL | 0 refills | Status: DC
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
## 2019-05-12 NOTE — Patient Instructions (Signed)
1. Change medication as follows:  Week 1: Start sertraline 25 mg daily , decrease citalopram 20 mg daily  Week 2: Increase sertraline 50 mg daily, discontinue citalopram  2.Continuerisperidone4mg  at night 3.Continuelithium750mg  at night 4.Continueclonidine 0.2 mg three times a day 6. Continue Trazodone 150 mg daily  7.Next appointment: 5/24 at 2:30

## 2019-05-29 ENCOUNTER — Emergency Department (HOSPITAL_COMMUNITY)
Admission: EM | Admit: 2019-05-29 | Discharge: 2019-05-29 | Disposition: A | Discharge status: Home / Self Care | Payer: BC Managed Care – PPO | Source: Home / Self Care | Attending: Emergency Medicine | Admitting: Emergency Medicine

## 2019-05-29 ENCOUNTER — Other Ambulatory Visit: Payer: Self-pay

## 2019-05-29 ENCOUNTER — Encounter (HOSPITAL_COMMUNITY): Payer: Self-pay | Admitting: Emergency Medicine

## 2019-05-29 DIAGNOSIS — F17210 Nicotine dependence, cigarettes, uncomplicated: Secondary | ICD-10-CM | POA: Insufficient documentation

## 2019-05-29 DIAGNOSIS — R252 Cramp and spasm: Secondary | ICD-10-CM

## 2019-05-29 DIAGNOSIS — Y999 Unspecified external cause status: Secondary | ICD-10-CM | POA: Insufficient documentation

## 2019-05-29 DIAGNOSIS — Z79899 Other long term (current) drug therapy: Secondary | ICD-10-CM | POA: Insufficient documentation

## 2019-05-29 DIAGNOSIS — M009 Pyogenic arthritis, unspecified: Secondary | ICD-10-CM | POA: Diagnosis not present

## 2019-05-29 DIAGNOSIS — S46811A Strain of other muscles, fascia and tendons at shoulder and upper arm level, right arm, initial encounter: Secondary | ICD-10-CM

## 2019-05-29 DIAGNOSIS — Y929 Unspecified place or not applicable: Secondary | ICD-10-CM | POA: Insufficient documentation

## 2019-05-29 DIAGNOSIS — Y939 Activity, unspecified: Secondary | ICD-10-CM | POA: Insufficient documentation

## 2019-05-29 DIAGNOSIS — X58XXXA Exposure to other specified factors, initial encounter: Secondary | ICD-10-CM | POA: Insufficient documentation

## 2019-05-29 DIAGNOSIS — M25511 Pain in right shoulder: Secondary | ICD-10-CM | POA: Diagnosis not present

## 2019-05-29 DIAGNOSIS — I10 Essential (primary) hypertension: Secondary | ICD-10-CM | POA: Insufficient documentation

## 2019-05-29 HISTORY — DX: Unspecified osteoarthritis, unspecified site: M19.90

## 2019-05-29 MED ORDER — NAPROXEN 250 MG PO TABS: ORAL_TABLET | ORAL | 0 refills | Status: DC

## 2019-05-29 MED ORDER — KETOROLAC TROMETHAMINE 30 MG/ML IJ SOLN
30.0000 mg | Freq: Once | INTRAMUSCULAR | Status: AC
  Administered 2019-05-29: 30 mg via INTRAMUSCULAR
  Filled 2019-05-29: qty 1

## 2019-05-29 MED ORDER — DIAZEPAM 5 MG PO TABS
5.0000 mg | ORAL_TABLET | Freq: Once | ORAL | Status: AC
  Administered 2019-05-29: 5 mg via ORAL
  Filled 2019-05-29: qty 1

## 2019-05-29 MED ORDER — CYCLOBENZAPRINE HCL 10 MG PO TABS: 10.0000 mg | ORAL_TABLET | Freq: Three times a day (TID) | ORAL | 0 refills | Status: DC | PRN

## 2019-05-29 NOTE — Discharge Instructions (Signed)
Use ice and heat on the sore muscles.  Take the medication as prescribed.  Please follow-up with your doctor if you continue to have problems.

## 2019-05-29 NOTE — ED Provider Notes (Signed)
[facility] EMERGENCY DEPARTMENT Provider Note   CSN: [id number] Arrival date & time: 05/29/19  0038   Time seen 2:30 AM  History Chief Complaint  Patient presents with  . Shoulder Pain    Herbert Walsh is a 29 y.o. male.  HPI   Patient states on April 12 he started having pain, swelling, tightness, and the area between his right neck and shoulder and collarbone on the right side.  He states any movement makes it worse.  Nothing makes it feel better.  He states he tried ice and heat that night without improvement.  He was seen at a walk-in clinic on the 13th and was given 2 shots, 1 sounds like possibly Decadron.  He was discharged home with Norco and prednisone.  He reports no relief.  Patient states he is right-handed.  He denies any numbness or tingling in his right hand.  He denies any lifting or any known injury.  He states the afternoon of 414 he started having cramps in his right calf.  He states on the 14th he took a tramadol in the morning which was prescribed by his primary care doctor because he was diagnosed with rheumatoid arthritis within the past 2 months and he took 2 Norco about 8 PM without relief.  Patient is requesting Dilaudid.  Looking at the chart from that day it looks like he got Decadron and Toradol.  PCP Ignatius Specking, MD   Past Medical History:  Diagnosis Date  . Arthritis   . Hypertension 2019    Patient Active Problem List   Diagnosis Date Noted  . 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 Difficulty controlling anger 03/19/2019  . PTSD (post-traumatic stress disorder) 11/01/2017    Past Surgical History:  Procedure Laterality Date  . APPENDECTOMY         Family History  Problem Relation Age of Onset  . Alcohol abuse Mother   . Depression Mother   . Drug abuse Mother   . Physical abuse Mother   . Sexual abuse Mother   . ADD / ADHD Paternal Aunt   . Alcohol abuse Maternal Grandfather   . Bipolar disorder Maternal Grandmother   . Schizophrenia  Maternal Grandmother   . Drug abuse Maternal Grandmother   . Alcohol abuse Paternal Grandfather   . Depression Paternal Grandfather   . Depression Paternal Grandmother   . Drug abuse Paternal Grandmother     Social History   Tobacco Use  . Smoking status: Current Every Day Smoker    Packs/day: 0.50    Years: 11.00    Pack years: 5.50    Types: Cigarettes  . Smokeless tobacco: Never Used  Substance Use Topics  . Alcohol use: No    Comment: one beer every 2-3 months  . Drug use: No    Home Medications Prior to Admission medications   Medication Sig Start Date End Date Taking? Authorizing Provider  atorvastatin (LIPITOR) 10 MG tablet Take 10 mg by mouth daily. 09/25/17   [provider]  citalopram (CELEXA) 40 MG tablet Take 1 tablet (40 mg total) by mouth daily. 03/19/19   Zena Amos, MD  cloNIDine (CATAPRES) 0.2 MG tablet Take 1 tablet (0.2 mg total) by mouth 3 (three) times daily. 06/16/19   Neysa Hotter, MD  cyclobenzaprine (FLEXERIL) 10 MG tablet Take 1 tablet (10 mg total) by mouth 3 (three) times daily as needed for muscle spasms. 05/29/19   Devoria Albe, MD  lisinopril (PRINIVIL,ZESTRIL) 10 MG tablet Take 10  mg by mouth daily.    [provider]  lithium carbonate (ESKALITH) 450 MG CR tablet Total of 750 mg daily (400 mg + 300 mg) 06/16/19   Neysa Hotter, MD  lithium carbonate (LITHOBID) 300 MG CR tablet Total of 750 mg daily (400 mg + 300 mg) 06/16/19   Neysa Hotter, MD  naproxen (NAPROSYN) 250 MG tablet Take 2 po BID with food prn pain 05/29/19   Devoria Albe, MD  risperidone (RISPERDAL) 4 MG tablet Take 1 tablet (4 mg total) by mouth at bedtime. 06/16/19   Neysa Hotter, MD  sertraline (ZOLOFT) 50 MG tablet 25 mg daily for one week, then 50 mg daily 05/12/19   Neysa Hotter, MD  traZODone (DESYREL) 150 MG tablet Take 1 tablet (150 mg total) by mouth at bedtime. 06/16/19   Neysa Hotter, MD    Allergies    Patient has no known allergies.  Review of Systems     Review of Systems  All other systems reviewed and are negative.   Physical Exam Updated Vital Signs BP 140/86 (BP Location: Left Arm)   Pulse (!) 119   Resp 18   Ht 6' (1.829 m)   Wt 127 kg   SpO2 96%   BMI 37.97 kg/m   Physical Exam Vitals and nursing note reviewed.  Constitutional:      Appearance: Normal appearance. He is obese.  HENT:     Head: Normocephalic and atraumatic.     Nose: Nose normal.  Eyes:     Extraocular Movements: Extraocular movements intact.     Conjunctiva/sclera: Conjunctivae normal.     Pupils: Pupils are equal, round, and reactive to light.  Neck:      Comments: Patient is nontender in the cervical spine or in the right shoulder.  He is tender in the right trapezius muscle and also over the right sternoclavicular joint.  Patient denies any history of IV drug use. Cardiovascular:     Rate and Rhythm: Normal rate.  Pulmonary:     Effort: Pulmonary effort is normal. No respiratory distress.  Musculoskeletal:        General: Normal range of motion.  Skin:    General: Skin is warm and dry.     Findings: No erythema, lesion or rash.  Neurological:     General: No focal deficit present.     Mental Status: He is alert and oriented to person, place, and time.     Cranial Nerves: No cranial nerve deficit.  Psychiatric:        Mood and Affect: Mood normal.        Behavior: Behavior normal.        Thought Content: Thought content normal.     ED Results / Procedures / Treatments   Labs (all labs ordered are listed, but only abnormal results are displayed) Labs Reviewed - No data to display  EKG None  Radiology No results found.  Procedures Procedures (including critical care time)  Medications Ordered in ED Medications  ketorolac (TORADOL) 30 MG/ML injection 30 mg (30 mg Intramuscular Given 05/29/19 0249)  diazepam (VALIUM) tablet 5 mg (5 mg Oral Given 05/29/19 0249)    ED Course  I have reviewed the triage vital signs and the nursing  notes.  Pertinent labs & imaging results that were available during my care of the patient were reviewed by me and considered in my medical decision making (see chart for details).    MDM Rules/Calculators/A&P  Patient states his rheumatoid arthritis makes him "hurt all over".  He does not appear to have pain over any joints except for the right sternoclavicular joint.  He denies any history of IV drug use to suggest osteomyelitis in that area.  His main tenderness however appears to be over the main portion of the trapezius between the neck and the shoulder.  Patient was given Toradol and oral Valium.  Patient was requesting Dilaudid, however I explained to him that he does not have any broken bones and narcotics are not indicated at this point.  He is already been taking narcotics without relief.  He was discharged home with anti-inflammatory and a muscle relaxer.  As far as the leg cramps he is not tender in the knee joint.  It is more in the muscle of his calf.  He denies any nausea or vomiting or diarrhea to suggest an electrolyte abnormality.  He does not want to have blood work done because he does not feel there is a "deficiency".  Final Clinical Impression(s) / ED Diagnoses Final diagnoses:  Trapezius muscle strain, right, initial encounter  Leg cramps    Rx / DC Orders ED Discharge Orders         Ordered    naproxen (NAPROSYN) 250 MG tablet     05/29/19 0316    cyclobenzaprine (FLEXERIL) 10 MG tablet  3 times daily PRN     05/29/19 0316         Plan discharge  Rolland Porter, MD, Barbette Or, MD 05/29/19 [phone]

## 2019-05-29 NOTE — ED Triage Notes (Signed)
Pt with R shoulder, neck, and collarbone pain since Monday. Was seen at pain clinic on Tues and prescribed Norco and Predinsone without relief. Also c/o "charley horse" in R calf area. Pt with hx of RA.

## 2019-05-30 ENCOUNTER — Inpatient Hospital Stay (HOSPITAL_COMMUNITY): Payer: BC Managed Care – PPO

## 2019-05-30 ENCOUNTER — Emergency Department (HOSPITAL_COMMUNITY): Payer: BC Managed Care – PPO

## 2019-05-30 ENCOUNTER — Encounter (HOSPITAL_COMMUNITY): Payer: Self-pay | Admitting: Emergency Medicine

## 2019-05-30 ENCOUNTER — Other Ambulatory Visit: Payer: Self-pay

## 2019-05-30 ENCOUNTER — Inpatient Hospital Stay (HOSPITAL_COMMUNITY)
Admission: EM | Admit: 2019-05-30 | Discharge: 2019-06-06 | DRG: 486 | Disposition: A | Discharge status: Home Health | Payer: BC Managed Care – PPO | Attending: Internal Medicine | Admitting: Internal Medicine

## 2019-05-30 ENCOUNTER — Emergency Department (HOSPITAL_BASED_OUTPATIENT_CLINIC_OR_DEPARTMENT_OTHER): Payer: BC Managed Care – PPO

## 2019-05-30 DIAGNOSIS — M069 Rheumatoid arthritis, unspecified: Secondary | ICD-10-CM | POA: Diagnosis present

## 2019-05-30 DIAGNOSIS — B9561 Methicillin susceptible Staphylococcus aureus infection as the cause of diseases classified elsewhere: Secondary | ICD-10-CM | POA: Diagnosis present

## 2019-05-30 DIAGNOSIS — F319 Bipolar disorder, unspecified: Secondary | ICD-10-CM | POA: Diagnosis present

## 2019-05-30 DIAGNOSIS — R000 Tachycardia, unspecified: Secondary | ICD-10-CM | POA: Diagnosis present

## 2019-05-30 DIAGNOSIS — M1990 Unspecified osteoarthritis, unspecified site: Secondary | ICD-10-CM | POA: Diagnosis present

## 2019-05-30 DIAGNOSIS — R7303 Prediabetes: Secondary | ICD-10-CM | POA: Diagnosis present

## 2019-05-30 DIAGNOSIS — Z95828 Presence of other vascular implants and grafts: Secondary | ICD-10-CM | POA: Diagnosis not present

## 2019-05-30 DIAGNOSIS — F6381 Intermittent explosive disorder: Secondary | ICD-10-CM | POA: Diagnosis present

## 2019-05-30 DIAGNOSIS — R748 Abnormal levels of other serum enzymes: Secondary | ICD-10-CM | POA: Diagnosis present

## 2019-05-30 DIAGNOSIS — K0889 Other specified disorders of teeth and supporting structures: Secondary | ICD-10-CM | POA: Diagnosis not present

## 2019-05-30 DIAGNOSIS — A419 Sepsis, unspecified organism: Secondary | ICD-10-CM

## 2019-05-30 DIAGNOSIS — T79A0XA Compartment syndrome, unspecified, initial encounter: Secondary | ICD-10-CM | POA: Insufficient documentation

## 2019-05-30 DIAGNOSIS — M609 Myositis, unspecified: Secondary | ICD-10-CM | POA: Diagnosis present

## 2019-05-30 DIAGNOSIS — Z978 Presence of other specified devices: Secondary | ICD-10-CM | POA: Diagnosis not present

## 2019-05-30 DIAGNOSIS — M869 Osteomyelitis, unspecified: Secondary | ICD-10-CM | POA: Diagnosis not present

## 2019-05-30 DIAGNOSIS — M0009 Staphylococcal polyarthritis: Secondary | ICD-10-CM | POA: Diagnosis not present

## 2019-05-30 DIAGNOSIS — Z7952 Long term (current) use of systemic steroids: Secondary | ICD-10-CM

## 2019-05-30 DIAGNOSIS — M009 Pyogenic arthritis, unspecified: Secondary | ICD-10-CM | POA: Diagnosis present

## 2019-05-30 DIAGNOSIS — F419 Anxiety disorder, unspecified: Secondary | ICD-10-CM

## 2019-05-30 DIAGNOSIS — R7881 Bacteremia: Secondary | ICD-10-CM | POA: Diagnosis not present

## 2019-05-30 DIAGNOSIS — Z79899 Other long term (current) drug therapy: Secondary | ICD-10-CM | POA: Diagnosis not present

## 2019-05-30 DIAGNOSIS — L03115 Cellulitis of right lower limb: Secondary | ICD-10-CM | POA: Diagnosis present

## 2019-05-30 DIAGNOSIS — M00011 Staphylococcal arthritis, right shoulder: Secondary | ICD-10-CM | POA: Diagnosis present

## 2019-05-30 DIAGNOSIS — Z20822 Contact with and (suspected) exposure to covid-19: Secondary | ICD-10-CM | POA: Diagnosis present

## 2019-05-30 DIAGNOSIS — E785 Hyperlipidemia, unspecified: Secondary | ICD-10-CM | POA: Diagnosis present

## 2019-05-30 DIAGNOSIS — E669 Obesity, unspecified: Secondary | ICD-10-CM | POA: Diagnosis present

## 2019-05-30 DIAGNOSIS — Z6837 Body mass index (BMI) 37.0-37.9, adult: Secondary | ICD-10-CM | POA: Diagnosis not present

## 2019-05-30 DIAGNOSIS — F4310 Post-traumatic stress disorder, unspecified: Secondary | ICD-10-CM | POA: Diagnosis present

## 2019-05-30 DIAGNOSIS — M79609 Pain in unspecified limb: Secondary | ICD-10-CM

## 2019-05-30 DIAGNOSIS — M25461 Effusion, right knee: Secondary | ICD-10-CM | POA: Diagnosis present

## 2019-05-30 DIAGNOSIS — M79604 Pain in right leg: Secondary | ICD-10-CM

## 2019-05-30 DIAGNOSIS — T380X5A Adverse effect of glucocorticoids and synthetic analogues, initial encounter: Secondary | ICD-10-CM | POA: Diagnosis present

## 2019-05-30 DIAGNOSIS — D72829 Elevated white blood cell count, unspecified: Secondary | ICD-10-CM | POA: Diagnosis present

## 2019-05-30 DIAGNOSIS — M00061 Staphylococcal arthritis, right knee: Secondary | ICD-10-CM | POA: Diagnosis present

## 2019-05-30 DIAGNOSIS — R071 Chest pain on breathing: Secondary | ICD-10-CM

## 2019-05-30 DIAGNOSIS — R739 Hyperglycemia, unspecified: Secondary | ICD-10-CM | POA: Diagnosis present

## 2019-05-30 DIAGNOSIS — I10 Essential (primary) hypertension: Secondary | ICD-10-CM | POA: Diagnosis present

## 2019-05-30 DIAGNOSIS — R079 Chest pain, unspecified: Secondary | ICD-10-CM

## 2019-05-30 DIAGNOSIS — L0291 Cutaneous abscess, unspecified: Secondary | ICD-10-CM | POA: Diagnosis not present

## 2019-05-30 DIAGNOSIS — F17210 Nicotine dependence, cigarettes, uncomplicated: Secondary | ICD-10-CM | POA: Diagnosis present

## 2019-05-30 DIAGNOSIS — R81 Glycosuria: Secondary | ICD-10-CM | POA: Insufficient documentation

## 2019-05-30 DIAGNOSIS — R202 Paresthesia of skin: Secondary | ICD-10-CM | POA: Diagnosis present

## 2019-05-30 DIAGNOSIS — L538 Other specified erythematous conditions: Secondary | ICD-10-CM

## 2019-05-30 DIAGNOSIS — M542 Cervicalgia: Secondary | ICD-10-CM

## 2019-05-30 DIAGNOSIS — M25511 Pain in right shoulder: Secondary | ICD-10-CM | POA: Diagnosis present

## 2019-05-30 LAB — LACTIC ACID, PLASMA
Lactic Acid, Venous: 4.2 mmol/L (ref 0.5–1.9)
Lactic Acid, Venous: 2.5 mmol/L (ref 0.5–1.9)

## 2019-05-30 LAB — CBC
HCT: 40.8 % (ref 39.0–52.0)
Hemoglobin: 12.7 g/dL — ABNORMAL LOW (ref 13.0–17.0)
MCH: 24.8 pg — ABNORMAL LOW (ref 26.0–34.0)
MCHC: 31.1 g/dL (ref 30.0–36.0)
MCV: 79.5 fL — ABNORMAL LOW (ref 80.0–100.0)
Platelets: 377 10*3/uL (ref 150–400)
RBC: 5.13 MIL/uL (ref 4.22–5.81)
RDW: 16.7 % — ABNORMAL HIGH (ref 11.5–15.5)
WBC: 19.8 10*3/uL — ABNORMAL HIGH (ref 4.0–10.5)
nRBC: 0 % (ref 0.0–0.2)

## 2019-05-30 LAB — BASIC METABOLIC PANEL
Anion gap: 14 (ref 5–15)
BUN: 8 mg/dL (ref 6–20)
CO2: 20 mmol/L — ABNORMAL LOW (ref 22–32)
Calcium: 9.3 mg/dL (ref 8.9–10.3)
Chloride: 94 mmol/L — ABNORMAL LOW (ref 98–111)
Creatinine, Ser: 0.95 mg/dL (ref 0.61–1.24)
GFR calc Af Amer: >60 mL/min (ref >60)
GFR calc non Af Amer: >60 mL/min (ref >60)
Glucose, Bld: 268 mg/dL — ABNORMAL HIGH (ref 70–99)
Potassium: 4.1 mmol/L (ref 3.5–5.1)
Sodium: 128 mmol/L — ABNORMAL LOW (ref 135–145)

## 2019-05-30 LAB — URINALYSIS, ROUTINE W REFLEX MICROSCOPIC
Bilirubin Urine: NEGATIVE
Cellular Cast, UA: 7
Glucose, UA: >=500 mg/dL — AB
Hgb urine dipstick: NEGATIVE
Ketones, ur: NEGATIVE mg/dL
Leukocytes,Ua: NEGATIVE
Nitrite: NEGATIVE
Protein, ur: 100 mg/dL — AB
Specific Gravity, Urine: 1.043 — ABNORMAL HIGH (ref 1.005–1.030)
pH: 6 (ref 5.0–8.0)

## 2019-05-30 LAB — TROPONIN I (HIGH SENSITIVITY)
Troponin I (High Sensitivity): 4 ng/L (ref <18)
Troponin I (High Sensitivity): 5 ng/L (ref <18)

## 2019-05-30 LAB — PROTIME-INR
INR: 1.1 (ref 0.8–1.2)
Prothrombin Time: 14.2 seconds (ref 11.4–15.2)

## 2019-05-30 LAB — CK: Total CK: 1475 U/L — ABNORMAL HIGH (ref 49–397)

## 2019-05-30 LAB — RESPIRATORY PANEL BY RT PCR (FLU A&B, COVID)
Influenza A by PCR: NEGATIVE
Influenza B by PCR: NEGATIVE
SARS Coronavirus 2 by RT PCR: NEGATIVE

## 2019-05-30 LAB — LITHIUM LEVEL: Lithium Lvl: 0.61 mmol/L (ref 0.60–1.20)

## 2019-05-30 LAB — C-REACTIVE PROTEIN: CRP: 39.8 mg/dL — ABNORMAL HIGH (ref <1.0)

## 2019-05-30 LAB — SEDIMENTATION RATE: Sed Rate: 90 mm/hr — ABNORMAL HIGH (ref 0–16)

## 2019-05-30 LAB — SARS CORONAVIRUS 2 (TAT 6-24 HRS): SARS Coronavirus 2: NEGATIVE

## 2019-05-30 LAB — APTT: aPTT: 36 seconds (ref 24–36)

## 2019-05-30 IMAGING — CT CT ANGIO CHEST
2 of 6 series · 17 of 46 positions shown · IV contrast (omnipaque)
Comparison: None.

CLINICAL DATA: Shortness of breath.

EXAM:
CT ANGIOGRAPHY CHEST WITH CONTRAST
TECHNIQUE: Multidetector CT imaging of the chest was performed using the
standard protocol during bolus administration of intravenous
contrast. Multiplanar CT image reconstructions and MIPs were
obtained to evaluate the vascular anatomy.
CONTRAST:  100mL OMNIPAQUE IOHEXOL 350 MG/ML SOLN

[Series 7: thins · axial · 0.91mm/px · z∈[+1161,+1415]mm · 14 of 280 slices shown]
[im 13/280  lung]
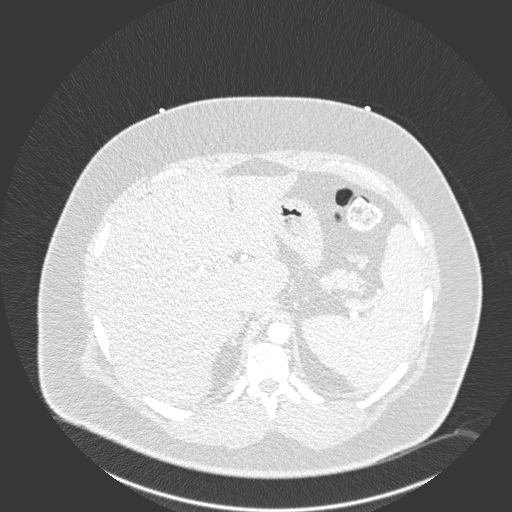
[im 37/280  soft-tissue]
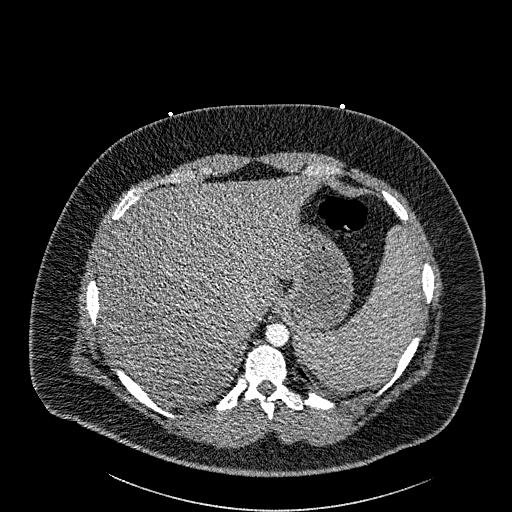
[im 49/280  lung]
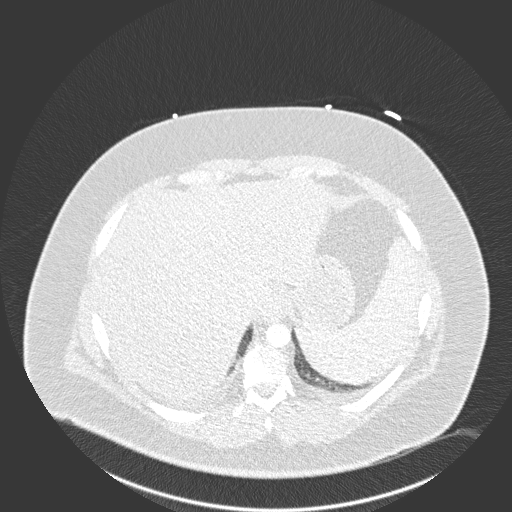
[im 73/280  soft-tissue]
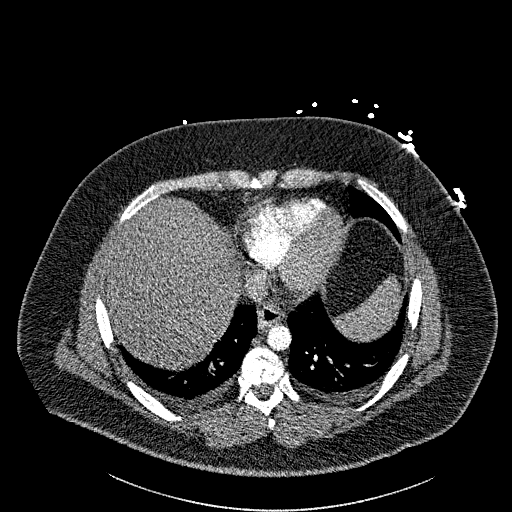
[im 98/280  lung]
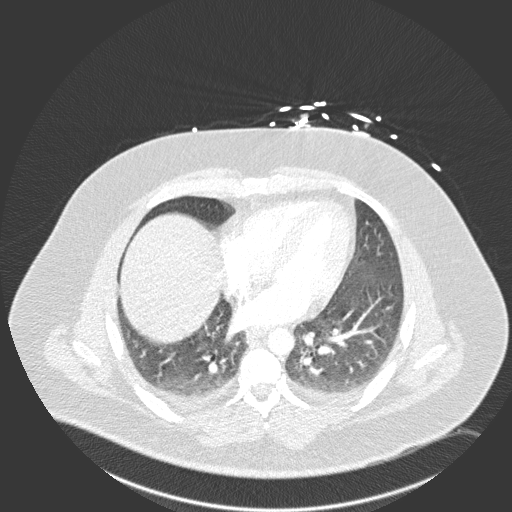
[im 110/280  soft-tissue]
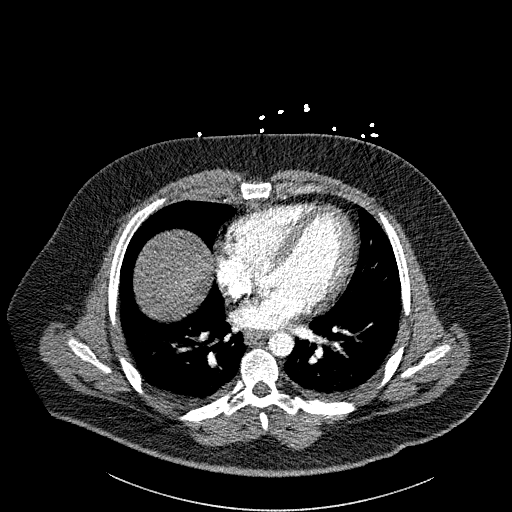
[im 134/280  lung]
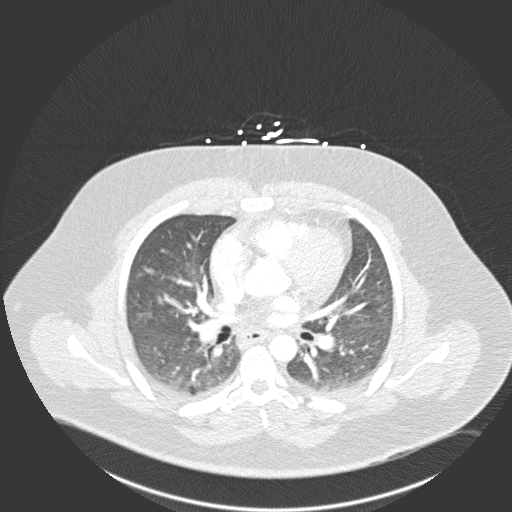
[im 146/280  soft-tissue]
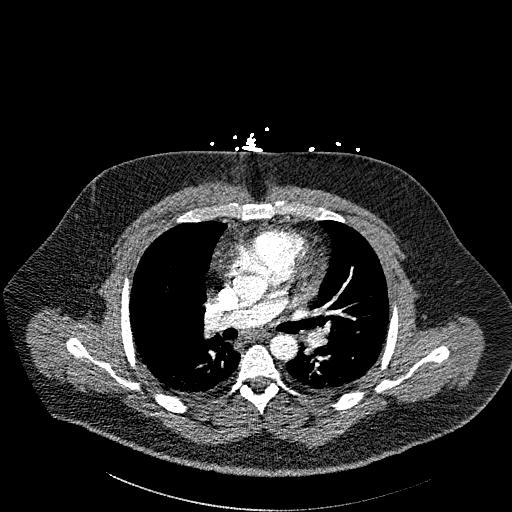
[im 170/280  lung]
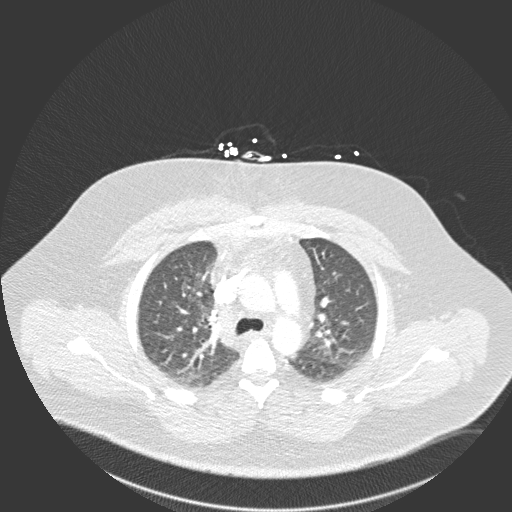
[im 182/280  soft-tissue]
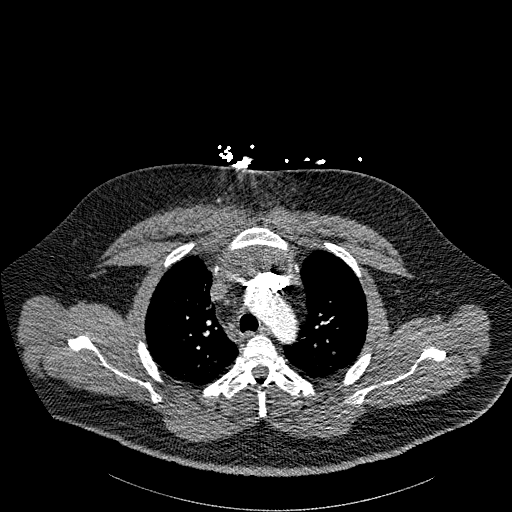
[im 207/280  lung]
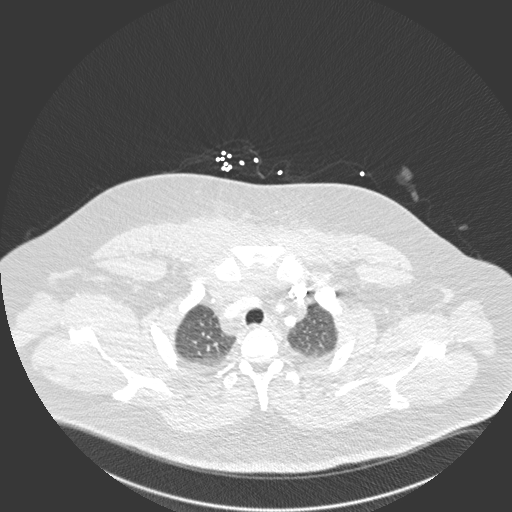
[im 231/280  soft-tissue]
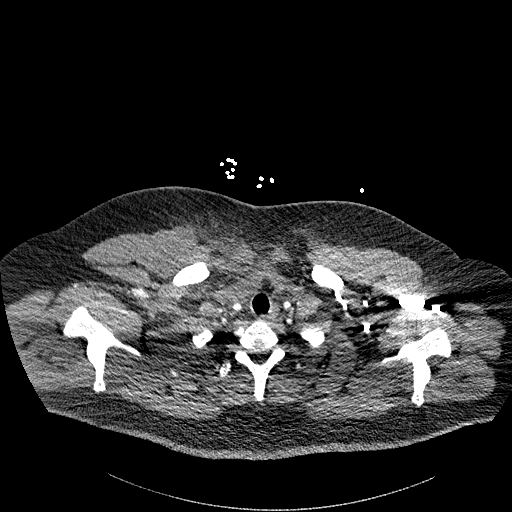
[im 243/280  lung]
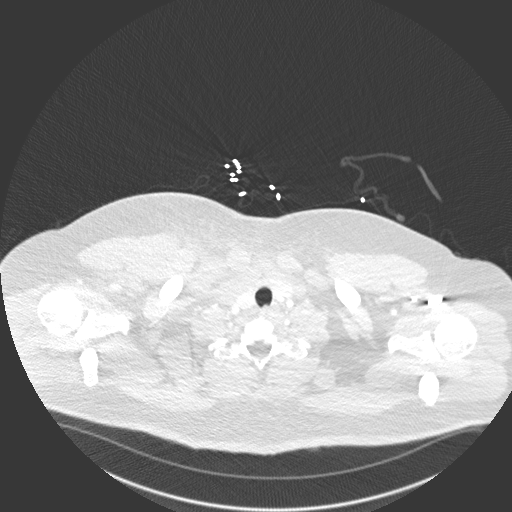
[im 267/280  soft-tissue]
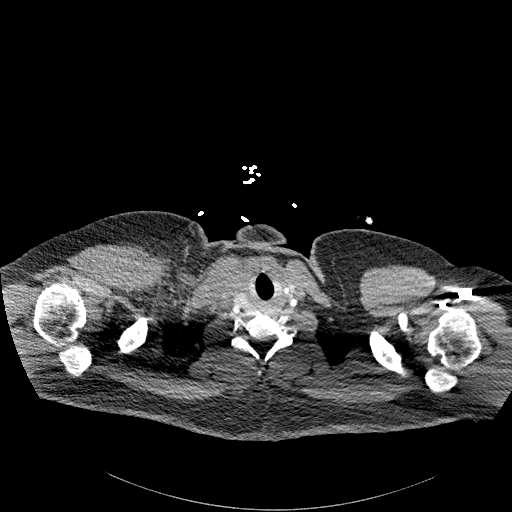

[Series 9: coronal mpr · coronal · 0.55mm/px · 3 of 178 slices shown]
[im 45/178  soft-tissue]
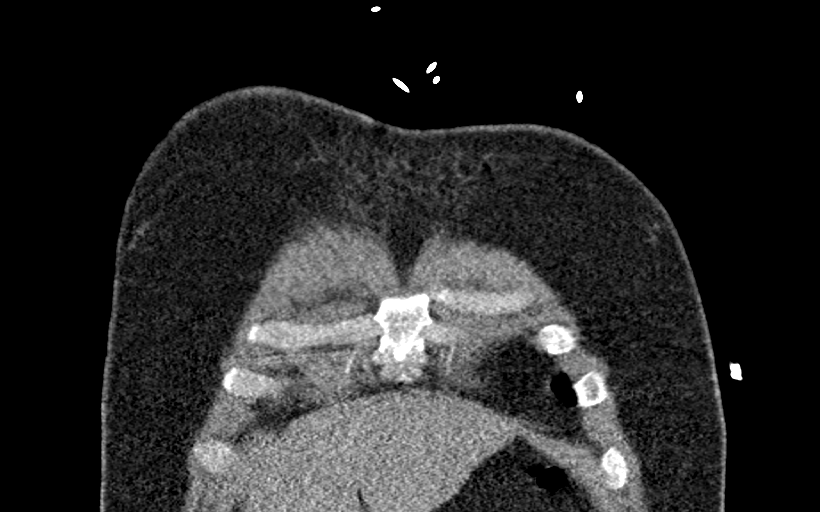
[im 89/178  soft-tissue]
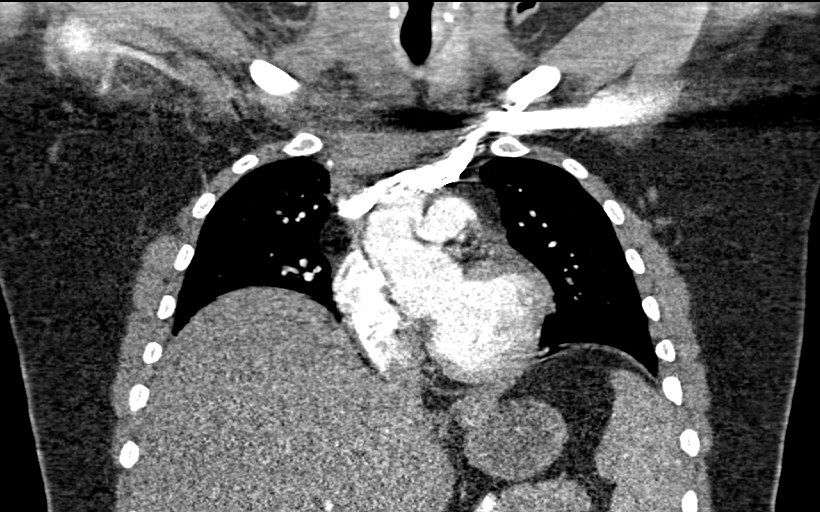
[im 133/178  soft-tissue]
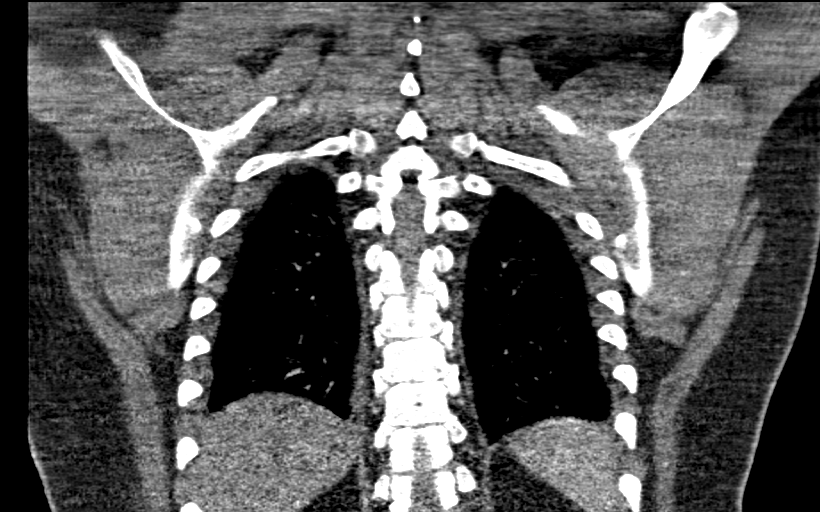

[17 of 46 positions shown; findings below may reference images not displayed]

FINDINGS: Cardiovascular: Satisfactory opacification of the pulmonary arteries
to the segmental level. No evidence of pulmonary embolism. Normal
heart size. No pericardial effusion.

Mediastinum/Nodes: No enlarged mediastinal, hilar, or axillary lymph
nodes. Thyroid gland, trachea, and esophagus demonstrate no
significant findings.

Lungs/Pleura: Lungs are clear. No pleural effusion or pneumothorax.

Upper Abdomen: No acute abnormality.

Musculoskeletal: There appears to be soft tissue prominence around
the right sternoclavicular joint with mild inflammatory changes
suggesting inflammation of the joint space or possibly septic
arthritis. Alternatively it may represent some form of neoplasm.

Review of the MIP images confirms the above findings.
IMPRESSION: No definite evidence of pulmonary embolus.

Soft tissue prominence and inflammatory changes are noted around the
right sternoclavicular joint suggesting possible septic arthritis or
other inflammation of the joint space. Alternatively it may
represent neoplasm. MRI is recommended for further evaluation.

## 2019-05-30 IMAGING — MR MR [PERSON_NAME] LOW WO/W CM*R*
29 series · 40 of 40 positions shown · IV contrast (Gadavist)
Comparison: CT scan [DATE]

CLINICAL DATA: Soft tissue infection, query compartment syndrome.

EXAM:
MRI OF LOWER RIGHT EXTREMITY WITHOUT AND WITH CONTRAST
TECHNIQUE: Multiplanar, multisequence MR imaging of the right tibia/fibula was
performed both before and after administration of intravenous
contrast.
CONTRAST:  10mL GADAVIST GADOBUTROL 1 MMOL/ML IV SOLN

[Series 2: T1 · coronal · right · 5.0mm · 1.25mm/px · 2 of 36 slices shown (1 of 4)]
[im 1/36]
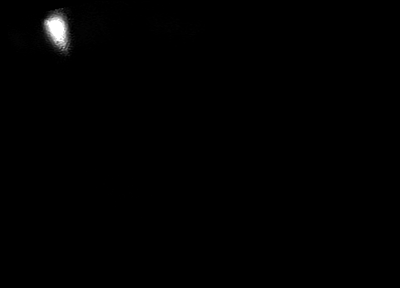
[im 36/36]
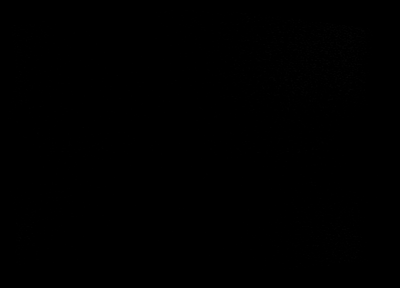

[Series 3: T1 · coronal · right · 5.0mm · 1.25mm/px · 2 of 36 slices shown (2 of 4)]
[im 1/36]
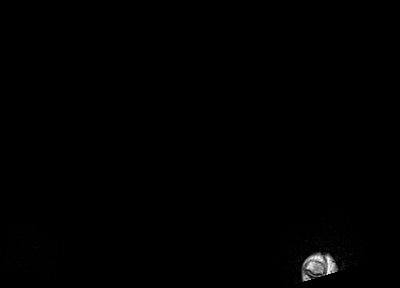
[im 36/36]
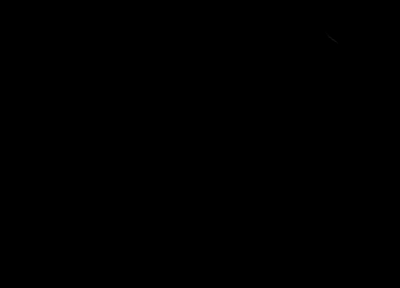

[Series 4: composed cor t1_comp · coronal · right · 6.0mm · 1.25mm/px · 2 of 35 slices shown]
[im 1/35]
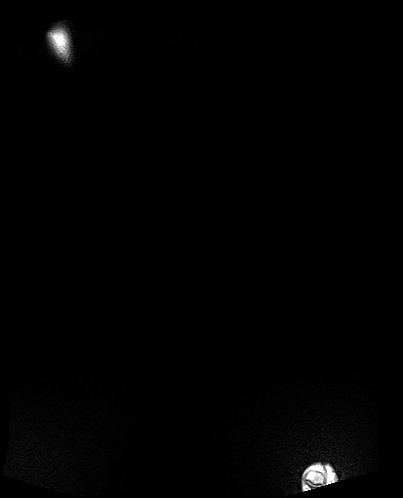
[im 35/35]
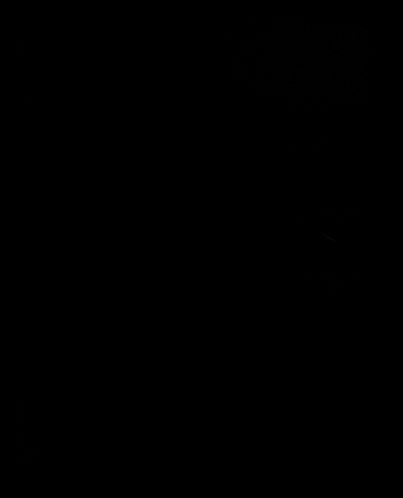

[Series 5: composed cor t1_comp_filt · coronal · right · 6.0mm · 1.25mm/px · 2 of 35 slices shown]
[im 1/35]
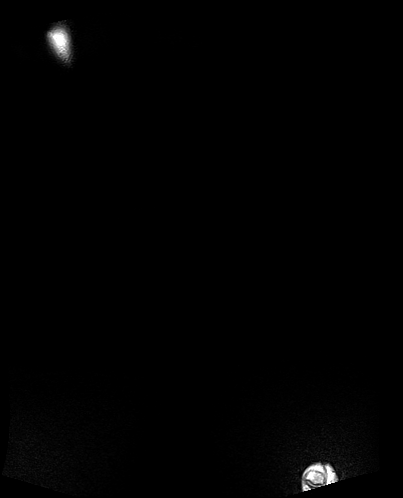
[im 35/35]
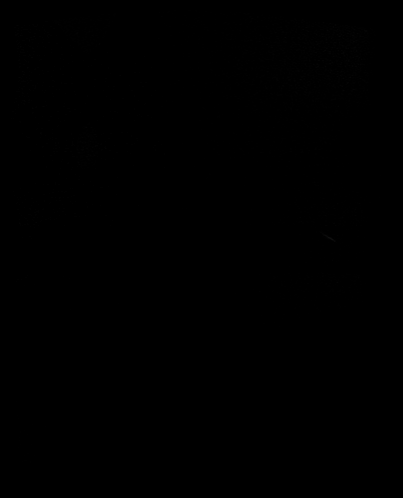

[Series 6: STIR · coronal · right · 5.0mm · 1.25mm/px · 2 of 36 slices shown (1 of 2)]
[im 1/36]
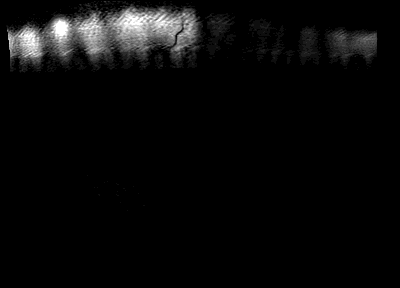
[im 36/36]
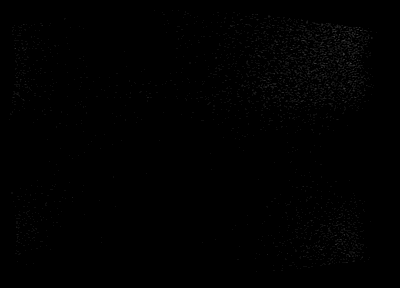

[Series 7: STIR · coronal · right · 5.0mm · 1.25mm/px · 2 of 36 slices shown (2 of 2)]
[im 1/36]
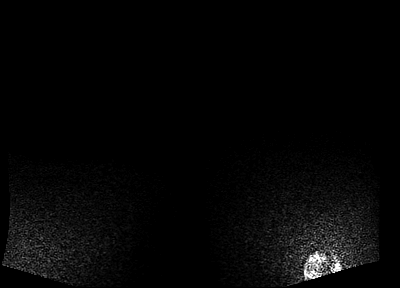
[im 36/36]
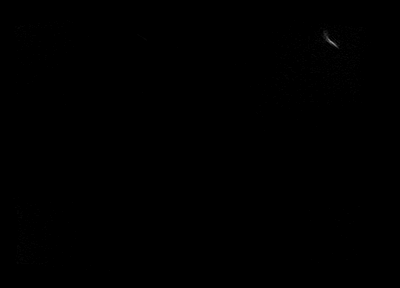

[Series 8: composed cor stir_comp · coronal · right · 6.0mm · 1.25mm/px · 1 of 36 slices shown]
[im 1/36]
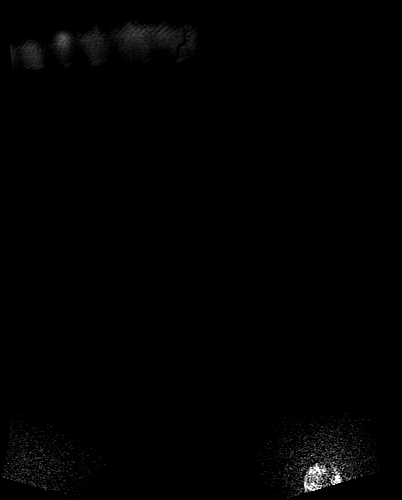

[Series 9: composed cor stir_comp_filt · coronal · right · 6.0mm · 1.25mm/px · 1 of 36 slices shown]
[im 1/36]
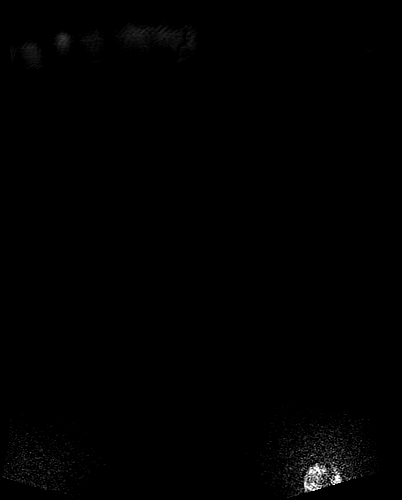

[Series 10: T1 · axial · right · 5.0mm · 0.78mm/px · 1 of 44 slices shown (3 of 4)]
[im 1/44]
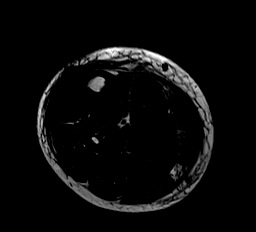

[Series 11: T1 · axial · right · 5.0mm · 0.78mm/px · 1 of 40 slices shown (4 of 4)]
[im 1/40]
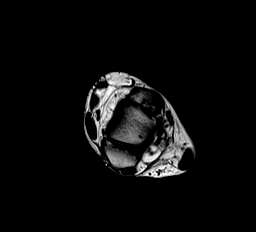

[Series 12: ax t1_comp · axial · right · 5.0mm · 0.78mm/px · z∈[-257,+219]mm · 2 of 82 slices shown]
[im 1/82]
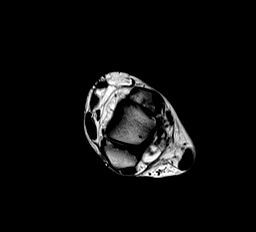
[im 82/82]
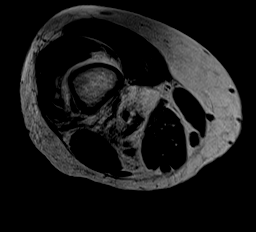

[Series 13: T2 fat-sat · axial · right · 5.0mm · 0.96mm/px · 1 of 44 slices shown (1 of 6)]
[im 1/44]
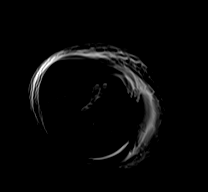

[Series 14: T2 fat-sat · axial · right · 5.0mm · 0.96mm/px · 1 of 38 slices shown (2 of 6)]
[im 1/38]
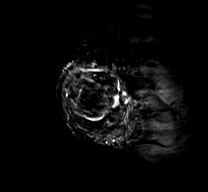

[Series 15: T2 · axial · right · 5.0mm · 0.96mm/px · z∈[-258,+212]mm · 2 of 79 slices shown (1 of 3)]
[im 1/79]
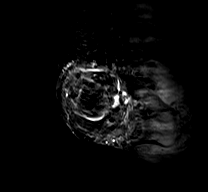
[im 79/79]
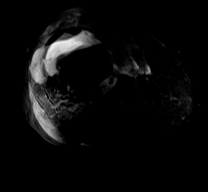

[Series 17: T2 fat-sat · axial · right · 5.0mm · 0.96mm/px · 1 of 44 slices shown (3 of 6)]
[im 1/44]
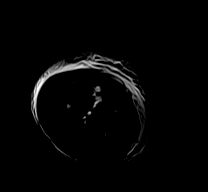

[Series 18: T2 fat-sat · axial · right · 5.0mm · 0.96mm/px · 1 of 40 slices shown (4 of 6)]
[im 1/40]
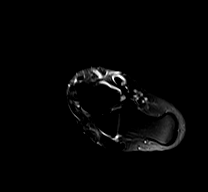

[Series 19: T2 · axial · right · 5.0mm · 0.96mm/px · z∈[-258,+212]mm · 2 of 80 slices shown (2 of 3)]
[im 1/80]
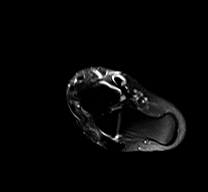
[im 80/80]
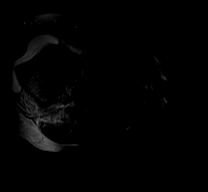

[Series 20: T2 fat-sat · sagittal · right · 4.0mm · 1.15mm/px · 1 of 36 slices shown (5 of 6)]
[im 1/36]
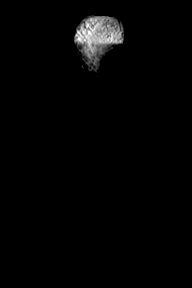

[Series 21: T2 fat-sat · sagittal · right · 4.0mm · 1.15mm/px · 1 of 36 slices shown (6 of 6)]
[im 1/36]
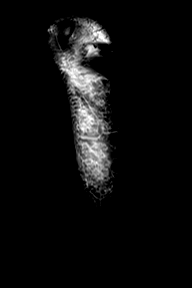

[Series 22: T2 · sagittal · right · 5.0mm · 1.15mm/px · 1 of 36 slices shown (3 of 3)]
[im 1/36]
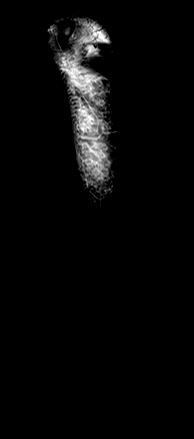

[Series 23: T1 fat-sat · axial · non-contrast · right · 5.0mm · 0.96mm/px · 1 of 44 slices shown (1 of 5)]
[im 1/44]
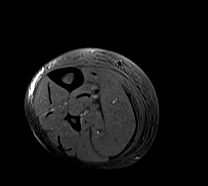

[Series 24: T1 fat-sat · axial · non-contrast · right · 5.0mm · 0.96mm/px · 1 of 40 slices shown (2 of 5)]
[im 1/40]
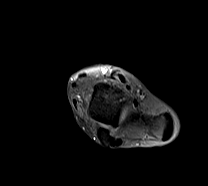

[Series 25: T1 fat-sat · axial · right · 5.0mm · 0.96mm/px · z∈[-257,+211]mm · 2 of 80 slices shown (3 of 5)]
[im 1/80]
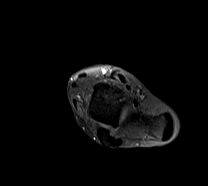
[im 80/80]
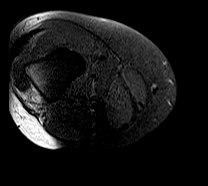

[Series 27: T1 fat-sat post-contrast · axial · right · 5.0mm · 0.96mm/px · 1 of 40 slices shown (1 of 4)]
[im 1/40]
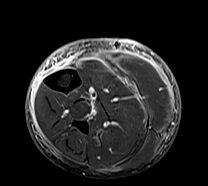

[Series 28: T1 fat-sat post-contrast · axial · right · 5.0mm · 0.96mm/px · 1 of 40 slices shown (2 of 4)]
[im 1/40]
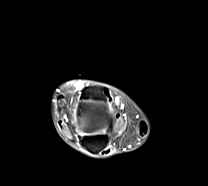

[Series 29: T1 fat-sat · axial · right · 5.0mm · 0.96mm/px · z∈[-228,+218]mm · 2 of 80 slices shown (4 of 5)]
[im 1/80]
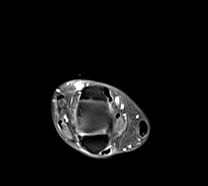
[im 80/80]
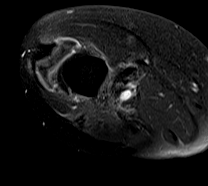

[Series 30: T1 fat-sat post-contrast · sagittal · right · 4.0mm · 0.86mm/px · 1 of 40 slices shown (3 of 4)]
[im 1/40]
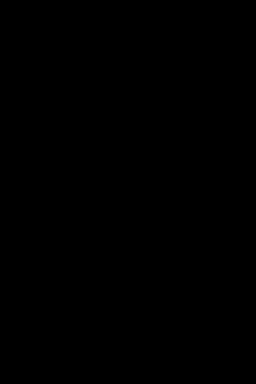

[Series 31: T1 fat-sat post-contrast · sagittal · right · 4.0mm · 0.86mm/px · 1 of 40 slices shown (4 of 4)]
[im 1/40]
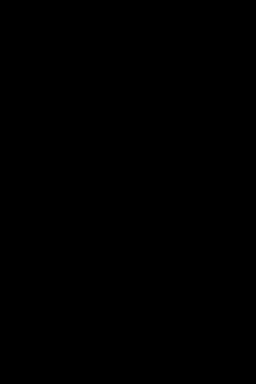

[Series 32: T1 fat-sat · sagittal · right · 5.0mm · 0.86mm/px · 1 of 39 slices shown (5 of 5)]
[im 1/39]
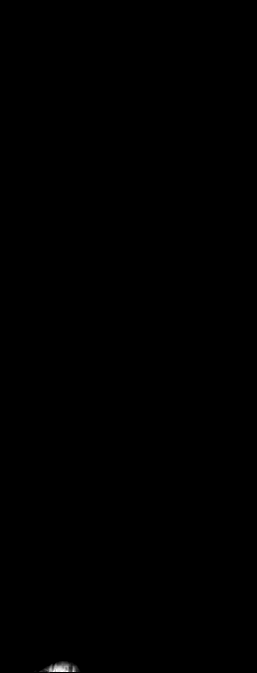

[40 of 40 positions shown; findings below may reference images not displayed]

FINDINGS: Despite efforts by the technologist and patient, motion artifact is
present on today's exam and could not be eliminated. This reduces
exam sensitivity and specificity.

Bones/Joint/Cartilage

Knee effusion on the right. Possibly ruptured right Baker's cyst. No
significant degree of subcortical marrow edema along the knee. No
findings of osteomyelitis in the tibia/fibula. Fairly thin synovial
head enhancement along the knee effusion, the degree of synovial
enhancement would be unusually small for septic joint.

Ligaments

N/A

Muscles and Tendons

There is accentuated edema and enhancement proximally in the
anterior compartment of the lower leg including within along the
tibialis anterior and extensor digitorum longus. There is also
abnormal infiltrative edema and enhancement within along parts of
the posterior compartment including the tibialis posterior, flexor
digitorum longus, proximal soleus muscle, the anterior portion of
medial head gastrocnemius, and a small portion of the anteromedial
portion of the lateral head gastrocnemius. Edema and enhancement
within along the popliteus muscle.

Abnormal edema and enhancement tracks along the adjacent fascia
planes in the posterior compartment, particularly between the soleus
and gastrocnemius musculature and along the superficial fascia
margin of the left calf where there is a fluid collection
posteromedially measuring about 0.5 by 3.8 by 9.5 cm (volume = 9
cm^3), partially shown on image 59/29. This only has a thin rim of
enhancement and is nonspecific for abscess.

I not observed gas tracking along musculature or fascia planes.

Plantaris muscle poorly seen.

Soft tissues

Subcutaneous edema is primarily anterior in the proximal portion of
the lower leg but extends more circumferentially distally, although
maintains and anteromedial confluence.
IMPRESSION: 1. Abnormal edema within muscle groups and along fascia planes with
associated enhancement especially proximally in the anterior and
posterior compartments of the lower leg. Myositis with fasciitis is
suspected. The lower portions of the compartments are not as
involved. We do not demonstrate diffuse muscular or diffuse
compartmental accentuated edema to further indicate risk for
compartment syndrome although compartmental pressures may still
warranted if clinical indicators of compartment syndrome are
present. Overlying cellulitis noted. Small fluid collection tracking
along the superficial fascia margin of the posterior compartment
posteromedially, although without the thick enhancing margin
characteristic of an abscess.
2. Knee effusion with only minimal synovial enhancement and no
findings of abnormal osseous activity to suggest osteomyelitis. In
general with this minimal degree of synovial enhancement I am
skeptical of septic joint.
3. No osteomyelitis.

## 2019-05-30 IMAGING — CT CT TIBIA FIBULA *R* W/ CM
3 series · 9 of 33 positions shown, 11 images · IV contrast (agent unspecified)
Comparison: None.

CONTRAST:  100mL OMNIPAQUE IOHEXOL 350 MG/ML SOLN

CLINICAL DATA: Right lower extremity pain and swelling.

EXAM:
CT OF THE LOWER RIGHT EXTREMITY WITH CONTRAST
TECHNIQUE: Multidetector CT imaging of the lower right extremity was performed
according to the standard protocol following intravenous contrast
administration.

[Series 4: lfov ext 3.0 b40s · axial · 0.49mm/px · z∈[+165,+165]mm · 1 of 161 slices shown, 2 images]
[im 87/161  soft-tissue]
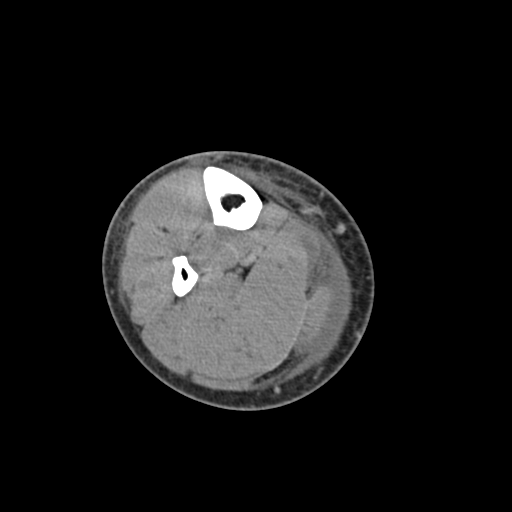
[im 87/161  bone]
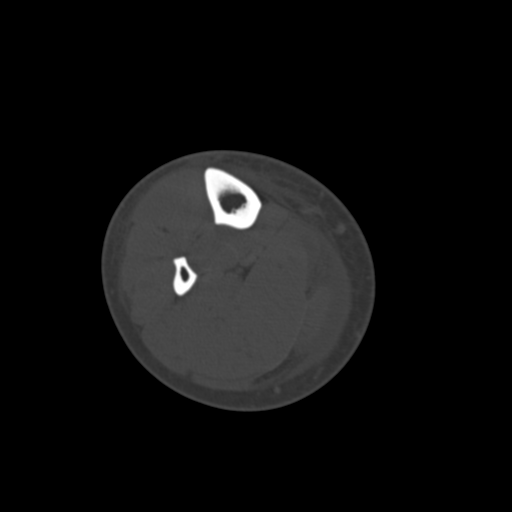

[Series 9: coronalsoft tissue · coronal · 0.55mm/px · 3 of 125 slices shown]
[im 25/125  bone]
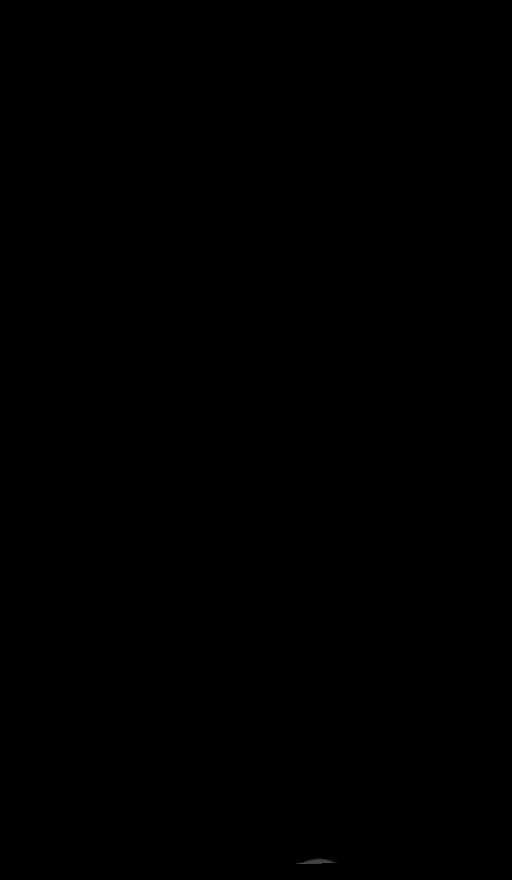
[im 50/125  bone]
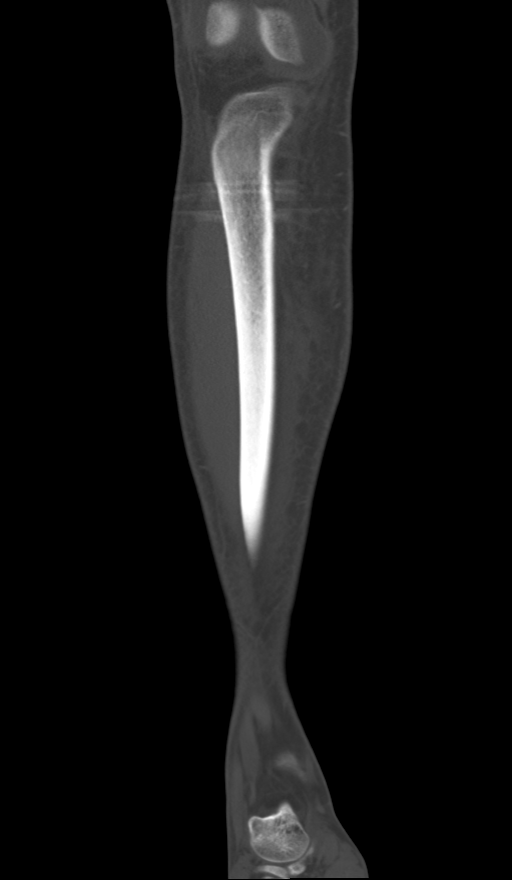
[im 75/125  bone]
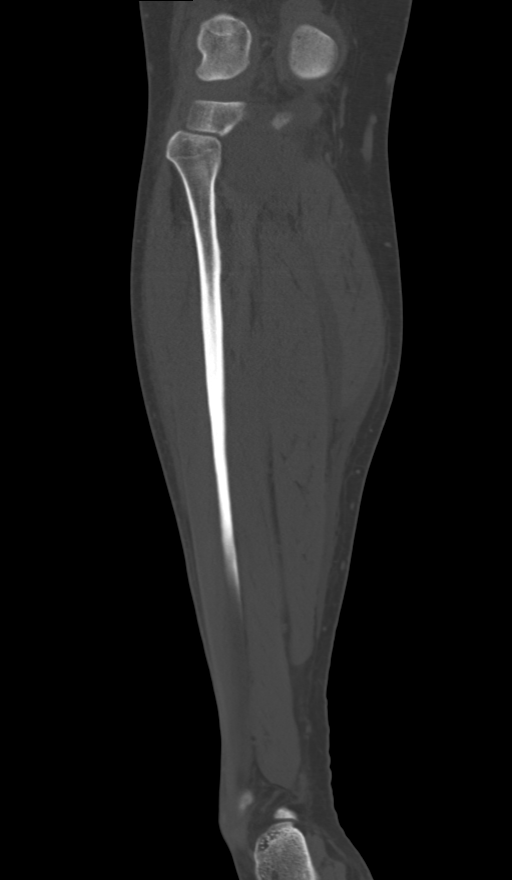

[Series 10: sagittalsoft tissue · sagittal · 0.51mm/px · 5 of 126 slices shown, 6 images]
[im 42/126  bone]
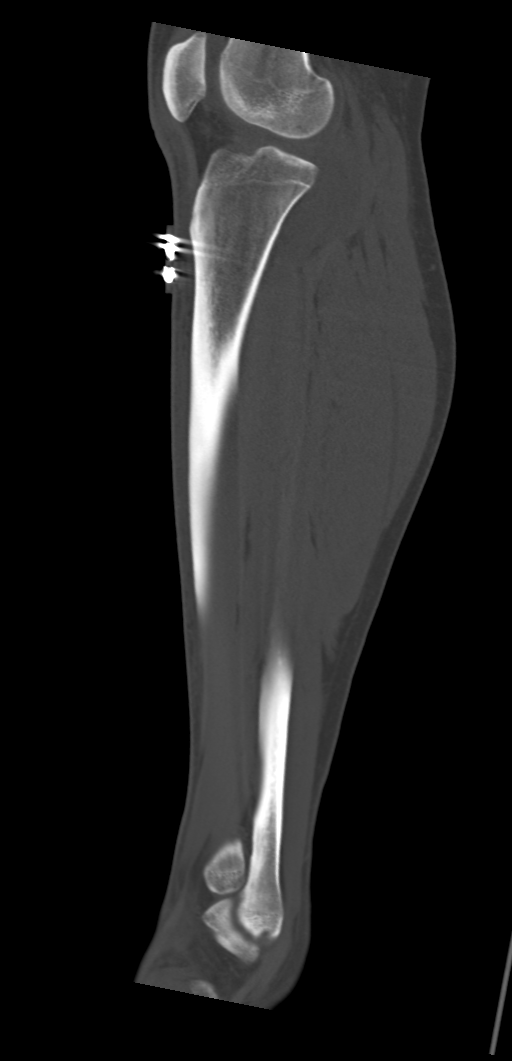
[im 53/126  bone]
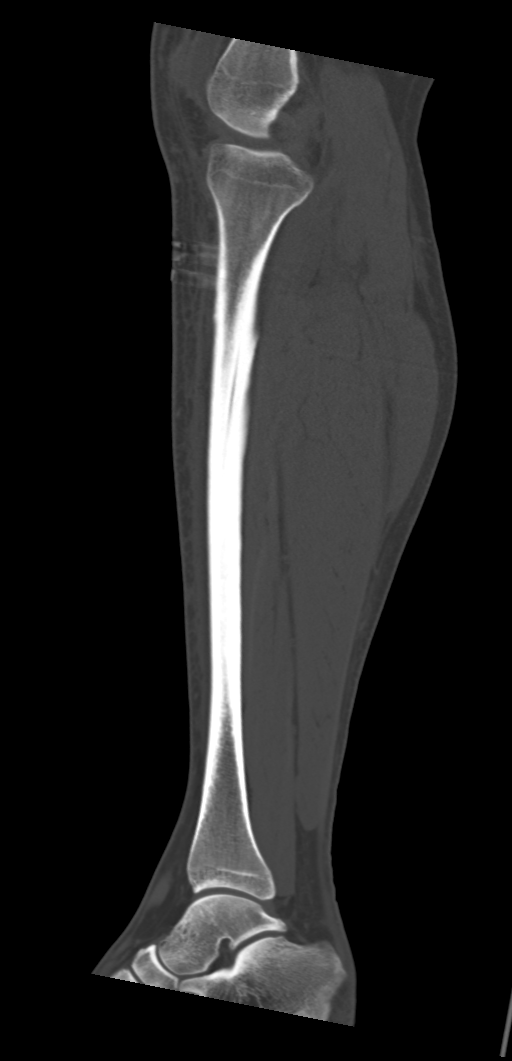
[im 63/126  soft-tissue]
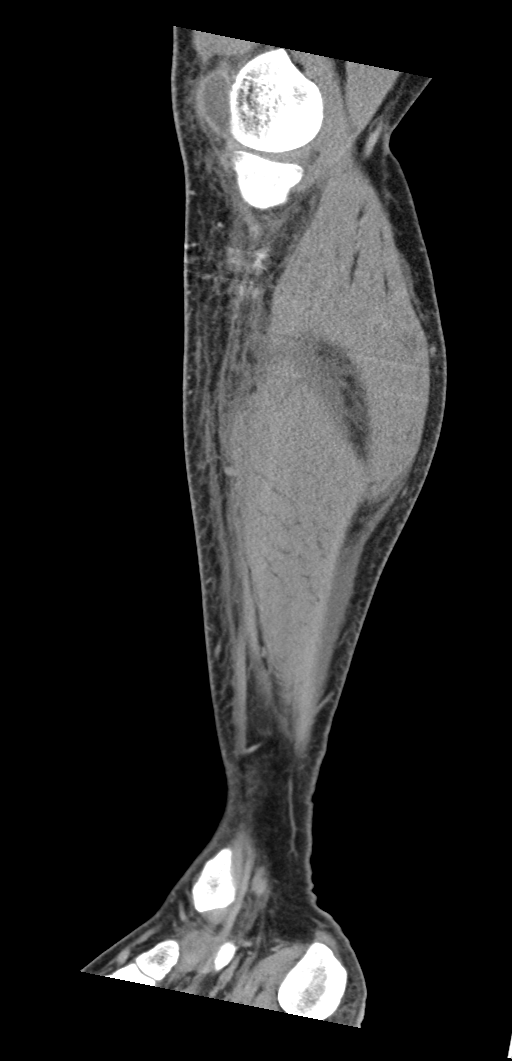
[im 63/126  bone]
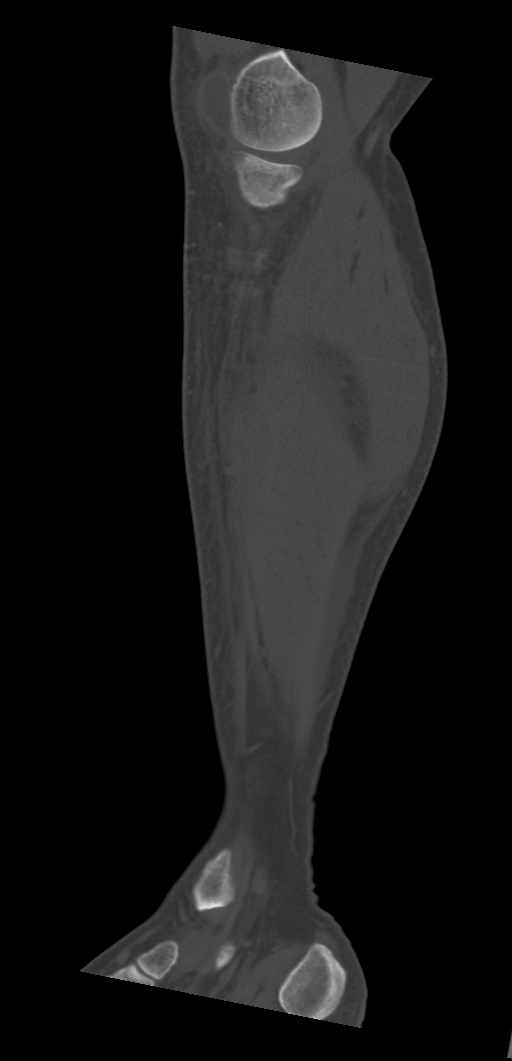
[im 73/126  bone]
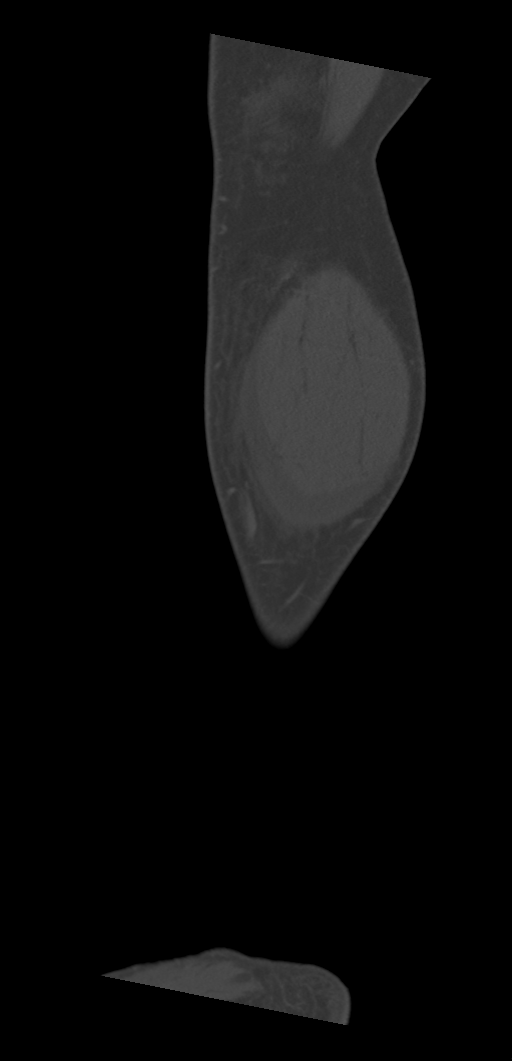
[im 84/126  bone]
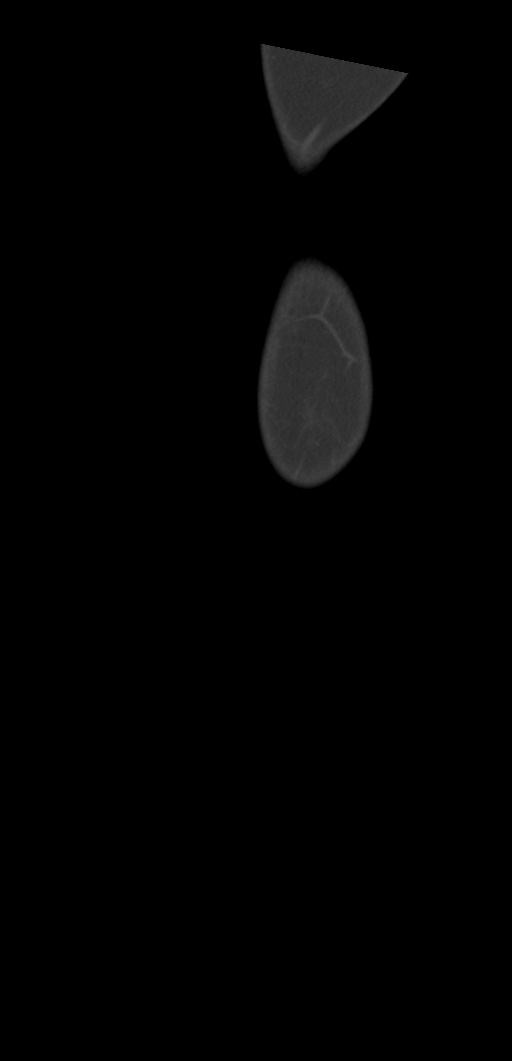

[9 of 33 positions shown; findings below may reference images not displayed]

FINDINGS: There is a moderate-sized knee joint effusion with typical rim like
synovial enhancement. I do not see any destructive bony changes to
suggest septic arthritis.

There is a fluid collection in the popliteal fossa which may be
fluid tracking back along the popliteus tendon. It is not a Baker's
cyst. There is also fluid between the gastroc and soleus muscles and
fluid tracking down along the medial soleus muscle. Findings could
be due to a ruptured cyst.

Mild subcutaneous soft tissue swelling/edema but no discrete fluid
collection to suggest an abscess. No findings to suggest
myofasciitis or pyomyositis.

The major venous structures appear patent. I do not see any definite
findings for deep venous thrombosis.

The bony structures are unremarkable.
IMPRESSION: 1. Moderate-sized knee joint effusion with typical rim like synovial
enhancement. I do not see any destructive cartilage or bony changes
to suggest septic arthritis.
2. Fluid collection in the popliteal fossa may be fluid tracking
back along the popliteus tendon. It is not a Baker's cyst.
3. Fluid tracking down along the medial soleus muscle and between
the gastroc and soleus muscles. Findings could be due to a ruptured
cyst.
4. No findings to suggest myofasciitis or pyomyositis.
5. Patent major venous structures.

## 2019-05-30 IMAGING — CR DG CHEST 2V
2 series · 2 of 2 positions shown · non-contrast
Comparison: No pertinent prior studies available for comparison.

CLINICAL DATA: Chest soreness. Additional provided: Right clavicle
and right shoulder pain toward neck.

EXAM:
CHEST - 2 VIEW

[chest pa]
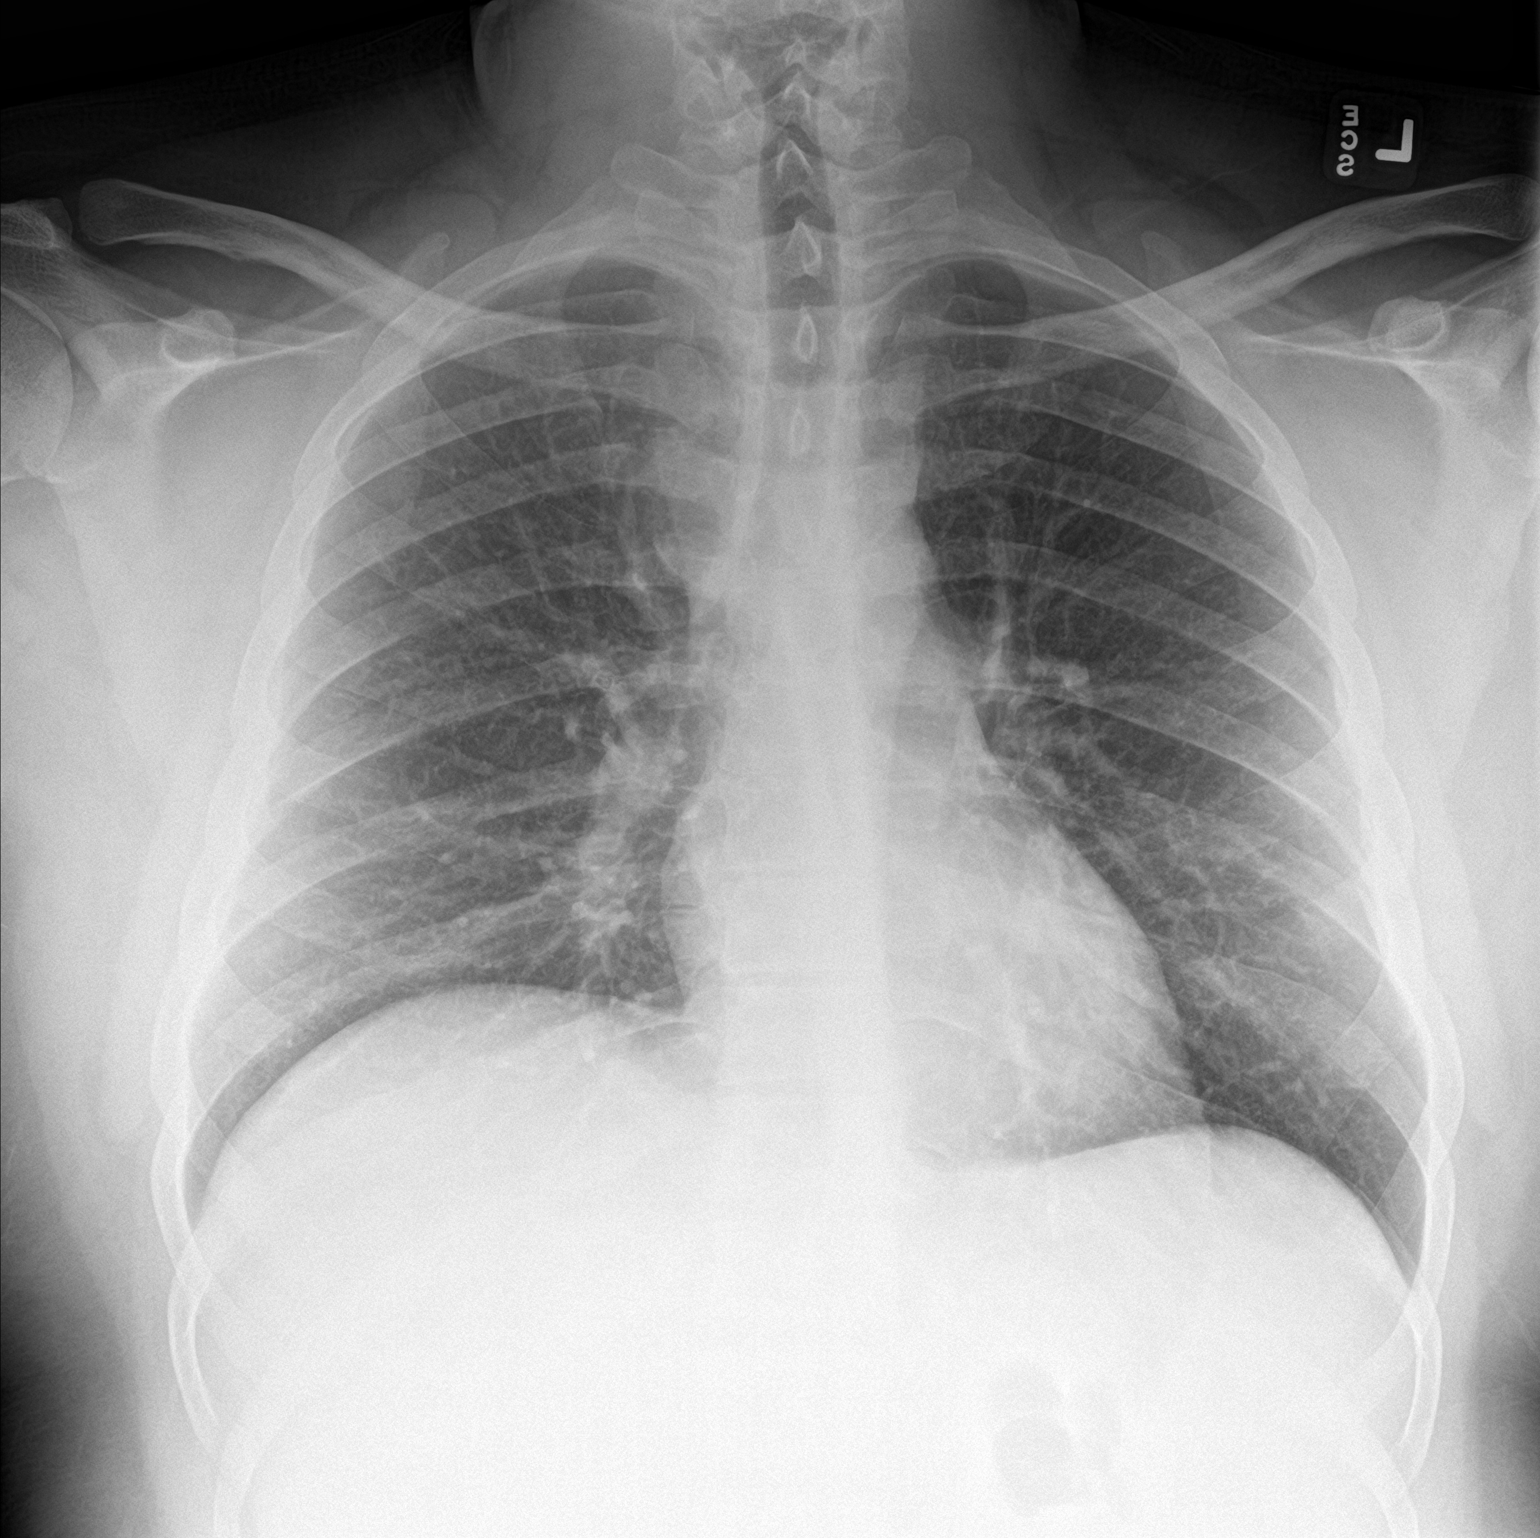

[chest lat]
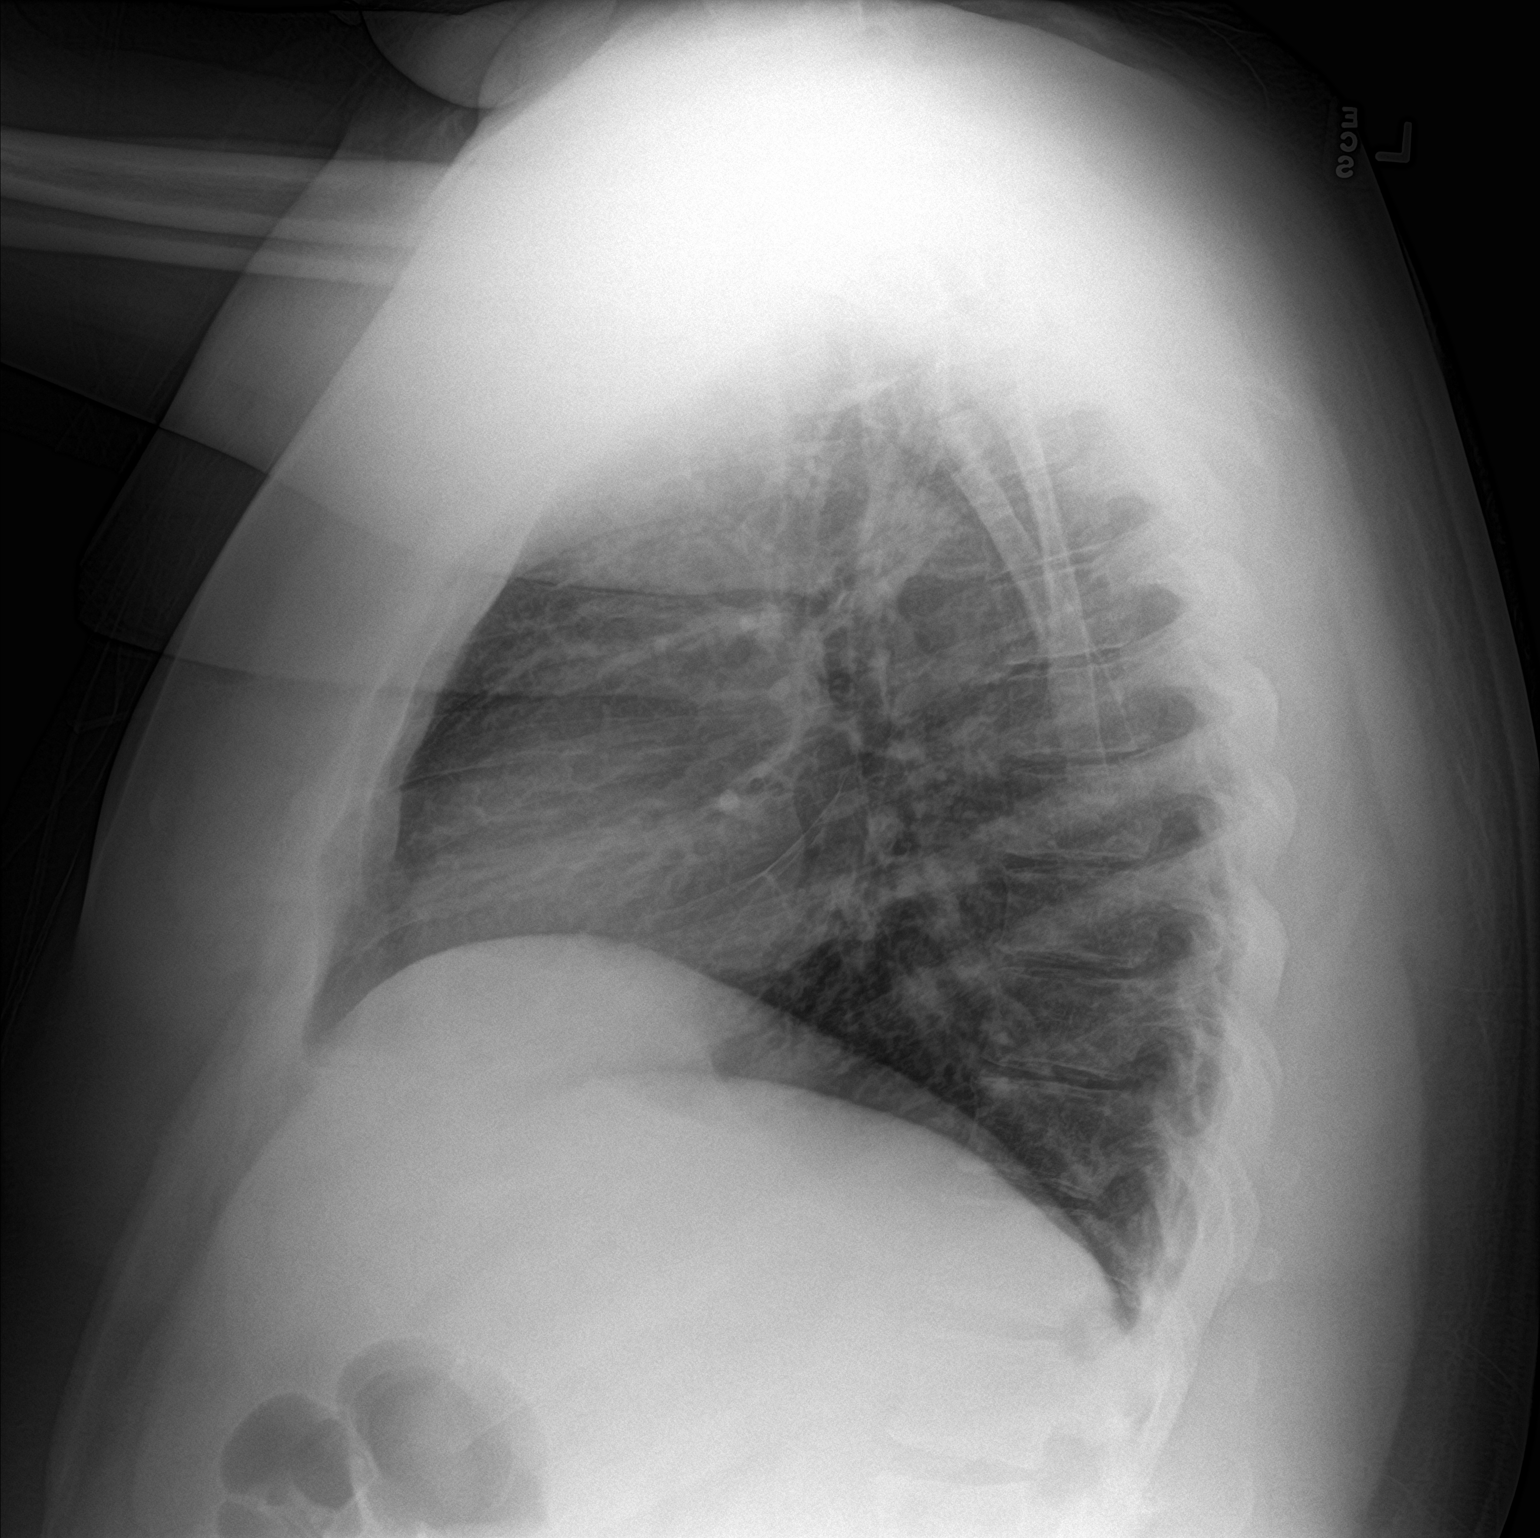

[2 of 2 positions shown; findings below may reference images not displayed]

FINDINGS: Heart size within normal limits. No evidence of airspace
consolidation within the lungs. No evidence of pleural effusion or
pneumothorax. No acute bony abnormality.
IMPRESSION: No evidence of acute cardiopulmonary abnormality.

## 2019-05-30 MED ORDER — SERTRALINE HCL 50 MG PO TABS
50.0000 mg | ORAL_TABLET | Freq: Every day | ORAL | Status: DC
  Administered 2019-05-30 – 2019-06-06 (×7): 50 mg via ORAL
  Filled 2019-05-30 (×8): qty 1

## 2019-05-30 MED ORDER — POLYETHYLENE GLYCOL 3350 17 G PO PACK: 17.0000 g | PACK | Freq: Every day | ORAL | Status: DC | PRN

## 2019-05-30 MED ORDER — SODIUM CHLORIDE 0.9 % IV BOLUS
1000.0000 mL | Freq: Once | INTRAVENOUS | Status: AC
  Administered 2019-05-30: 18:00:00 1000 mL via INTRAVENOUS

## 2019-05-30 MED ORDER — SODIUM CHLORIDE 0.9 % IV SOLN: INTRAVENOUS | Status: DC

## 2019-05-30 MED ORDER — CITALOPRAM HYDROBROMIDE 40 MG PO TABS
40.0000 mg | ORAL_TABLET | Freq: Every day | ORAL | Status: DC
  Administered 2019-05-30 – 2019-06-06 (×7): 40 mg via ORAL
  Filled 2019-05-30 (×5): qty 1
  Filled 2019-05-30: qty 4
  Filled 2019-05-30: qty 1

## 2019-05-30 MED ORDER — MORPHINE SULFATE (PF) 4 MG/ML IV SOLN
8.0000 mg | Freq: Once | INTRAVENOUS | Status: AC
  Administered 2019-05-30: 18:00:00 8 mg via INTRAVENOUS
  Filled 2019-05-30: qty 2

## 2019-05-30 MED ORDER — LISINOPRIL 10 MG PO TABS
10.0000 mg | ORAL_TABLET | Freq: Every day | ORAL | Status: DC
  Administered 2019-05-30 – 2019-06-06 (×7): 10 mg via ORAL
  Filled 2019-05-30 (×7): qty 1

## 2019-05-30 MED ORDER — LITHIUM CARBONATE ER 450 MG PO TBCR
750.0000 mg | EXTENDED_RELEASE_TABLET | Freq: Every day | ORAL | Status: DC
  Administered 2019-06-01: 750 mg via ORAL
  Filled 2019-05-30 (×2): qty 1

## 2019-05-30 MED ORDER — METRONIDAZOLE IN NACL 5-0.79 MG/ML-% IV SOLN: 500.0000 mg | Freq: Once | INTRAVENOUS | Status: DC

## 2019-05-30 MED ORDER — SODIUM CHLORIDE 0.9 % IV BOLUS
1000.0000 mL | Freq: Once | INTRAVENOUS | Status: AC
  Administered 2019-05-30: 1000 mL via INTRAVENOUS

## 2019-05-30 MED ORDER — ENOXAPARIN SODIUM 40 MG/0.4ML ~~LOC~~ SOLN
40.0000 mg | SUBCUTANEOUS | Status: DC
  Administered 2019-05-30 – 2019-06-01 (×3): 40 mg via SUBCUTANEOUS
  Filled 2019-05-30 (×3): qty 0.4

## 2019-05-30 MED ORDER — ALPRAZOLAM 0.5 MG PO TABS
0.5000 mg | ORAL_TABLET | Freq: Once | ORAL | Status: AC
  Administered 2019-05-31: 0.5 mg via ORAL
  Filled 2019-05-30: qty 1

## 2019-05-30 MED ORDER — SODIUM CHLORIDE 0.9 % IV SOLN: 1.0000 g | Freq: Once | INTRAVENOUS | Status: DC

## 2019-05-30 MED ORDER — VANCOMYCIN HCL IN DEXTROSE 1-5 GM/200ML-% IV SOLN: 1000.0000 mg | Freq: Once | INTRAVENOUS | Status: DC

## 2019-05-30 MED ORDER — ONDANSETRON HCL 4 MG/2ML IJ SOLN
4.0000 mg | Freq: Once | INTRAMUSCULAR | Status: AC
  Administered 2019-05-30: 15:00:00 4 mg via INTRAVENOUS
  Filled 2019-05-30: qty 2

## 2019-05-30 MED ORDER — LITHIUM CARBONATE ER 300 MG PO TBCR: 300.0000 mg | EXTENDED_RELEASE_TABLET | ORAL | Status: DC

## 2019-05-30 MED ORDER — SODIUM CHLORIDE 0.9 % IV SOLN
2.0000 g | INTRAVENOUS | Status: DC
  Filled 2019-05-30 (×2): qty 20

## 2019-05-30 MED ORDER — RISPERIDONE 2 MG PO TABS
4.0000 mg | ORAL_TABLET | Freq: Every day | ORAL | Status: DC
  Administered 2019-05-30 – 2019-06-05 (×7): 4 mg via ORAL
  Filled 2019-05-30 (×8): qty 2

## 2019-05-30 MED ORDER — VANCOMYCIN HCL 10 G IV SOLR
2500.0000 mg | Freq: Once | INTRAVENOUS | Status: AC
  Administered 2019-05-30: 2500 mg via INTRAVENOUS
  Filled 2019-05-30: qty 2500

## 2019-05-30 MED ORDER — HYDROMORPHONE HCL 1 MG/ML IJ SOLN
1.0000 mg | Freq: Once | INTRAMUSCULAR | Status: AC
  Administered 2019-05-31: 1 mg via INTRAVENOUS

## 2019-05-30 MED ORDER — CLINDAMYCIN PHOSPHATE 600 MG/50ML IV SOLN
600.0000 mg | Freq: Once | INTRAVENOUS | Status: AC
  Administered 2019-05-30: 17:00:00 600 mg via INTRAVENOUS
  Filled 2019-05-30: qty 50

## 2019-05-30 MED ORDER — SODIUM CHLORIDE 0.9 % IV SOLN
2.0000 g | Freq: Once | INTRAVENOUS | Status: AC
  Administered 2019-05-30: 19:00:00 2 g via INTRAVENOUS
  Filled 2019-05-30: qty 20

## 2019-05-30 MED ORDER — LITHIUM CARBONATE ER 450 MG PO TBCR: 450.0000 mg | EXTENDED_RELEASE_TABLET | ORAL | Status: DC

## 2019-05-30 MED ORDER — IOHEXOL 350 MG/ML SOLN
100.0000 mL | Freq: Once | INTRAVENOUS | Status: AC | PRN
  Administered 2019-05-30: 100 mL via INTRAVENOUS

## 2019-05-30 MED ORDER — SODIUM CHLORIDE 0.9 % IV BOLUS
500.0000 mL | Freq: Once | INTRAVENOUS | Status: AC
  Administered 2019-05-30: 19:00:00 500 mL via INTRAVENOUS

## 2019-05-30 MED ORDER — HYDROMORPHONE HCL 1 MG/ML IJ SOLN
1.0000 mg | Freq: Four times a day (QID) | INTRAMUSCULAR | Status: DC | PRN
  Administered 2019-05-30 – 2019-05-31 (×2): 1 mg via INTRAVENOUS
  Filled 2019-05-30 (×3): qty 1

## 2019-05-30 MED ORDER — HYDROMORPHONE HCL 1 MG/ML IJ SOLN
1.0000 mg | Freq: Once | INTRAMUSCULAR | Status: AC
  Administered 2019-05-30: 15:00:00 1 mg via INTRAVENOUS
  Filled 2019-05-30: qty 1

## 2019-05-30 MED ORDER — VANCOMYCIN HCL 1750 MG/350ML IV SOLN
1750.0000 mg | Freq: Two times a day (BID) | INTRAVENOUS | Status: DC
  Administered 2019-05-31: 1750 mg via INTRAVENOUS
  Filled 2019-05-30: qty 350

## 2019-05-30 MED ORDER — LACTATED RINGERS IV BOLUS (SEPSIS): 1500.0000 mL | Freq: Once | INTRAVENOUS | Status: DC

## 2019-05-30 NOTE — Progress Notes (Signed)
Pharmacy Antibiotic Note  Herbert Walsh is a 29 y.o. male admitted on 05/30/2019 with sepsis.  Pharmacy has been consulted for Vancomycin dosing. Pt afebrile but with elevated WBC at 19.8 and elevated lactate 4.2. Scr 0.95.   Plan: Vancomycin 2500mg  IV once, then 1750mg  IV Q12 hrs  (Est AUC 514.9, Scr 0.95, Vd 0.5) Monitor renal function, cultures/sensitivities, clinical progression Check vancomycin levels as indicated  Height: 6' (182.9 cm) Weight: 127 kg (280 lb) IBW/kg (Calculated) : 77.6  Temp (24hrs), Avg:99 F (37.2 C), Min:98.6 F (37 C), Max:99.3 F (37.4 C)  Recent Labs  Lab 05/30/19 1050 05/30/19 1635  WBC 19.8*  --   CREATININE 0.95  --   LATICACIDVEN  --  4.2*    Estimated Creatinine Clearance: 155.2 mL/min (by C-G formula based on SCr of 0.95 mg/dL).    No Known Allergies  Antimicrobials this admission: Ceftriaxone 4/16 >>  Vancomycin 4/16 >>  Metronidazole 4/16 x1  Dose adjustments this admission: N/A  Microbiology results: 4/16 BCx:  4/16 UCx:    5/16, PharmD PGY1 Pharmacy Resident Phone: [phone] 05/30/2019  6:23 PM  Please check [email or web address] for unit-specific pharmacy phone numbers.

## 2019-05-30 NOTE — ED Notes (Signed)
Admitting paged to RN Andruw per his request

## 2019-05-30 NOTE — Progress Notes (Addendum)
Herbert Walsh is a 29 y.o. male patient admitted from ED awake, alert - oriented  X 4.  VSS - Blood pressure (!) 155/84, pulse (!) 129, temperature 98.7 F (37.1 C), temperature source Oral, resp. rate (!) 25, height 6' (1.829 m), weight 127 kg, SpO2 95 %.    IV in place, occlusive dsg intact without redness.   Pt c/o of pain on his right leg 9/10. PRN pain med given as ordered. MD on call made aware pt on yellow MEWS for elevated HR and RR.     Will cont to eval and treat per MD orders.  Rolland Porter, RN 05/30/2019 10:05 PM

## 2019-05-30 NOTE — ED Notes (Signed)
As patient for urine sample, patient stated that he did not need to urinate at this time.

## 2019-05-30 NOTE — ED Provider Notes (Addendum)
[facility] EMERGENCY DEPARTMENT Provider Note   CSN: [id number] Arrival date & time: 05/30/19  1016     History Chief Complaint  Patient presents with  . Leg Swelling    Herbert Walsh is a 29 y.o. male.  The history is provided by the patient and medical records. No language interpreter was used.     29 year old male with history of hypertension, sent here by PCP for evaluation leg swelling.  Patient report for the past week he has had acute onset of right leg pain and swelling as well as pain to his right collarbone region.  Pain is sharp throbbing 10 out of 10 persistent with tightness sensation to both.  Does endorse some pleuritic chest pain with deep breathing but no fever chills or cough no hemoptysis.  No numbness or weakness.  No recent injury no prior history of PE or DVT no recent surgery or prolonged bedrest.  Patient mention having history of rheumatoid arthritis not taking any medication.  He also was seen in the ED a few days ago for his complaints and states he was just given prednisone and pain medication which did not provide adequate relief.  Past Medical History:  Diagnosis Date  . Arthritis   . Hypertension 2019    Patient Active Problem List   Diagnosis Date Noted  . 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 Difficulty controlling anger 03/19/2019  . PTSD (post-traumatic stress disorder) 11/01/2017    Past Surgical History:  Procedure Laterality Date  . APPENDECTOMY         Family History  Problem Relation Age of Onset  . Alcohol abuse Mother   . Depression Mother   . Drug abuse Mother   . Physical abuse Mother   . Sexual abuse Mother   . ADD / ADHD Paternal Aunt   . Alcohol abuse Maternal Grandfather   . Bipolar disorder Maternal Grandmother   . Schizophrenia Maternal Grandmother   . Drug abuse Maternal Grandmother   . Alcohol abuse Paternal Grandfather   . Depression Paternal Grandfather   . Depression Paternal Grandmother   .  Drug abuse Paternal Grandmother     Social History   Tobacco Use  . Smoking status: Current Every Day Smoker    Packs/day: 0.50    Years: 11.00    Pack years: 5.50    Types: Cigarettes  . Smokeless tobacco: Never Used  Substance Use Topics  . Alcohol use: No    Comment: one beer every 2-3 months  . Drug use: No    Home Medications Prior to Admission medications   Medication Sig Start Date End Date Taking? Authorizing Provider  atorvastatin (LIPITOR) 10 MG tablet Take 10 mg by mouth daily. 09/25/17   [provider]  citalopram (CELEXA) 40 MG tablet Take 1 tablet (40 mg total) by mouth daily. 03/19/19   Nevada Crane, MD  cloNIDine (CATAPRES) 0.2 MG tablet Take 1 tablet (0.2 mg total) by mouth 3 (three) times daily. 06/16/19   [name], MD  cyclobenzaprine (FLEXERIL) 10 MG tablet Take 1 tablet (10 mg total) by mouth 3 (three) times daily as needed for muscle spasms. 05/29/19   Rolland Porter, MD  lisinopril (PRINIVIL,ZESTRIL) 10 MG tablet Take 10 mg by mouth daily.    [provider]  lithium carbonate (ESKALITH) 450 MG CR tablet Total of 750 mg daily (400 mg + 300 mg) 06/16/19   [name], MD  lithium carbonate (LITHOBID) 300 MG CR tablet Total of 750  mg daily (400 mg + 300 mg) 06/16/19   Neysa Hotter, MD  naproxen (NAPROSYN) 250 MG tablet Take 2 po BID with food prn pain 05/29/19   Devoria Albe, MD  risperidone (RISPERDAL) 4 MG tablet Take 1 tablet (4 mg total) by mouth at bedtime. 06/16/19   Neysa Hotter, MD  sertraline (ZOLOFT) 50 MG tablet 25 mg daily for one week, then 50 mg daily 05/12/19   Neysa Hotter, MD  traZODone (DESYREL) 150 MG tablet Take 1 tablet (150 mg total) by mouth at bedtime. 06/16/19   Neysa Hotter, MD    Allergies    Patient has no known allergies.  Review of Systems   Review of Systems  All other systems reviewed and are negative.   Physical Exam Updated Vital Signs BP 136/89 (BP Location: Left Arm)   Pulse (!) 121   Temp 98.6 F (37 C)  (Oral)   Resp 18   Ht 6' (1.829 m)   Wt 127 kg   SpO2 98%   BMI 37.97 kg/m   Physical Exam Vitals and nursing note reviewed.  Constitutional:      Appearance: He is well-developed.     Comments: Patient appears uncomfortable, tachypneic, and anxious.  HENT:     Head: Atraumatic.  Eyes:     Conjunctiva/sclera: Conjunctivae normal.  Cardiovascular:     Rate and Rhythm: Tachycardia present.     Pulses: Normal pulses.     Heart sounds: Normal heart sounds.  Pulmonary:     Breath sounds: No wheezing, rhonchi or rales.     Comments: Mildly tachypneic. Chest:     Chest wall: Tenderness (Tenderness to right clavicle region on palpation and right upper chest without overlying skin changes.) present.  Abdominal:     Palpations: Abdomen is soft.     Tenderness: There is no abdominal tenderness.  Musculoskeletal:        General: Tenderness (Right lower extremity: Leg is edematous, firm on palpation with palpable dorsalis pedis pulse and brisk cap refill.  Positive Homans' sign..  No warmth or erythema appreciated.) present.     Cervical back: Neck supple.  Skin:    Findings: No rash.  Neurological:     Mental Status: He is alert and oriented to person, place, and time.     ED Results / Procedures / Treatments   Labs (all labs ordered are listed, but only abnormal results are displayed) Labs Reviewed  BASIC METABOLIC PANEL - Abnormal; Notable for the following components:      Result Value   Sodium 128 (*)    Chloride 94 (*)    CO2 20 (*)    Glucose, Bld 268 (*)    All other components within normal limits  CBC - Abnormal; Notable for the following components:   WBC 19.8 (*)    Hemoglobin 12.7 (*)    MCV 79.5 (*)    MCH 24.8 (*)    RDW 16.7 (*)    All other components within normal limits  LITHIUM LEVEL  TROPONIN I (HIGH SENSITIVITY)  TROPONIN I (HIGH SENSITIVITY)    EKG None   Date: 05/30/2019  Rate: 119  Rhythm: sinus tachycardic  QRS Axis: normal  Intervals:  normal  ST/T Wave abnormalities: normal  Conduction Disutrbances: none  Narrative Interpretation:   Old EKG Reviewed: No significant changes noted EKG reviewed and interpreted by me     Radiology DG Chest 2 View  Result Date: 05/30/2019 CLINICAL DATA:  Chest soreness. Additional provided:  Right clavicle and right shoulder pain toward neck. EXAM: CHEST - 2 VIEW COMPARISON:  No pertinent prior studies available for comparison. FINDINGS: Heart size within normal limits. No evidence of airspace consolidation within the lungs. No evidence of pleural effusion or pneumothorax. No acute bony abnormality. IMPRESSION: No evidence of acute cardiopulmonary abnormality. Electronically Signed   By: Jackey Loge DO   On: 05/30/2019 11:23    Procedures Procedures (including critical care time)  Medications Ordered in ED Medications - No data to display  ED Course  I have reviewed the triage vital signs and the nursing notes.  Pertinent labs & imaging results that were available during my care of the patient were reviewed by me and considered in my medical decision making (see chart for details).    MDM Rules/Calculators/A&P                      BP 136/89 (BP Location: Left Arm)   Pulse (!) 121   Temp 98.6 F (37 C) (Oral)   Resp 18   Ht 6' (1.829 m)   Wt 127 kg   SpO2 98%   BMI 37.97 kg/m   Final Clinical Impression(s) / ED Diagnoses Final diagnoses:  None    Rx / DC Orders ED Discharge Orders    None     2:00 PM Patient here with pain to the right side of chest History right clavicle without signs of underlying infection.  His right lower extremity is edematous and tight on palpation.  Suspect DVT, less likely compartment syndrome low suspicion for necrotizing fasciitis.  Elevated WBC however patient recently was given steroids which can potentially cause leukocytosis.  Pt is on Lithium, will check level.   2:52 PM Patient shows signs of hyperglycemia with a CBG of 268 and  normal anion gap.  Sodium of 128, corrected sodium is 131.  White count of 19.8 however recent he was given prednisone several days prior for his presenting complaint I suspect his elevated blood count is related to that.  EKG shows sinus tachycardia, troponin is negative, initial chest x-ray unremarkable.  At this time, venous Doppler study of right lower extremity is currently pending as well as chest CT angiogram pending.  Patient signed out to oncoming team who will follow up on the results.  If chest CT and ultrasound negative, patient will need further evaluation for his leg swelling.   3:14 PM DVT study of right lower extremity without any evidence of DVT.  In the setting of exquisite tenderness to his right lower extremity, tachycardia, tachypnea and elevated white count, will obtain CT scan with contrast of the right tib-fib to rule out infectious etiology.  Will initiate antibiotic, obtain blood culture and lactic acid in the event that this is an infectious etiology.  I am not really convinced this is an infection therefore code sepsis was not initiated.  CAre discussed with Dr. Madilyn Hook.  Herbert Walsh was evaluated in Emergency Department on 05/30/2019 for the symptoms described in the history of present illness. He was evaluated in the context of the global COVID-19 pandemic, which necessitated consideration that the patient might be at risk for infection with the SARS-CoV-2 virus that causes COVID-19. Institutional protocols and algorithms that pertain to the evaluation of patients at risk for COVID-19 are in a state of rapid change based on information released by regulatory bodies including the CDC and federal and state organizations. These policies and algorithms were followed during the patient's  care in the ED.     Fayrene Helper, PA-C 05/30/19 1515    Tilden Fossa, MD 06/02/19 1353

## 2019-05-30 NOTE — ED Provider Notes (Addendum)
  Physical Exam  BP 139/89   Pulse (!) 117   Temp 99.3 F (37.4 C) (Oral)   Resp (!) 30   Ht 6' (1.829 m)   Wt 127 kg   SpO2 96%   BMI 37.97 kg/m   Physical Exam  ED Course/Procedures     .Critical Care Performed by: Derwood Kaplan, MD Authorized by: Derwood Kaplan, MD   Critical care provider statement:    Critical care time (minutes):  45   Critical care was time spent personally by me on the following activities:  Discussions with consultants, evaluation of patient's response to treatment, examination of patient, ordering and performing treatments and interventions, ordering and review of laboratory studies, ordering and review of radiographic studies, pulse oximetry, re-evaluation of patient's condition, obtaining history from patient or surrogate and review of old charts    MDM   29 year old with history of rheumatoid arthritis comes in a chief complaint of right-sided chest, clavicular pain and also right leg pain.  Symptoms started 3 days ago and has advanced to the point where patient is limping now.  He is noted to have erythematous, edematous and warm right lower extremity.  Tenderness primarily over the calf and popliteal region.   Ultrasound DVT is negative. Patient has a white count of 19.8, CK of 1475. He is noted to be tachycardic and CT PE, CT lower extremity are pending. IV clindamycin has been ordered at the time of signout.  Reassessment: Patient's CT scan is showing some nonspecific fluid collection in the lower extremity and also signs of possible septic arthritis in the sternoclavicular joint region. Patient denies any substance abuse.  He is diagnosed with rheumatoid arthritis but is not on any immunosuppressive agents.  I spoke with orthopedic surgery with concerns for possible infection and early signs of compartment syndrome in the leg.  Dr. Roda Shutters recommends that we get an MRI with contrast of his lower extremity.  He will see the patient.  He also  requests consulting CT surgery for the questionable septic arthritis.   Blood cultures and lactic acid ordered. Nursing staff advised to not wait for blood cultures prior to starting antibiotics.  Reassessment at 520: The patient appears reasonably screened and/or stabilized for discharge and I doubt any other medical condition or other [facility] requiring further screening, evaluation, or treatment in the ED at this time prior to discharge.   Results from the ER workup discussed with the patient face to face and all questions answered to the best of my ability. The patient is safe for discharge with strict return precautions.  The patient is noted to have a lactate >=2. With the current information available to me, I do not think the elevated lactate is directly related to Sepsis. Elevated lactate is due to poor perfusion to the muscle due to increased swelling in the leg leading to some perfusion deficits.  Derwood Kaplan, MD 05/30/19 Sandria Senter, MD 05/30/19 1725    Derwood Kaplan, MD 05/31/19 0102

## 2019-05-30 NOTE — ED Triage Notes (Signed)
Patient sent by PCP to rule out DVT and PE. Patient has pain and swelling to right lower leg along with pain to right collarbone area.

## 2019-05-30 NOTE — Consult Note (Signed)
ORTHOPAEDIC CONSULTATION  REQUESTING PHYSICIAN: Tyson Alias, *  Chief Complaint: Right calf pain and swelling  HPI: Herbert Walsh is a 29 y.o. male who presents with to the ER with a 5-day history of worsening right lower extremity pain particularly in the right calf. He denies any definite trauma. He denies taking any anticoagulants. He was recently diagnosed with rheumatoid arthritis by his PCP due to a very high rheumatoid factor. He was recently prescribed prednisone and tramadol. He denies any rashes or bug bites. He has septic arthritis of the right [place] joint which is being managed by CT surgery. He denies any fevers chills chest pain abdominal pain. Orthopedics consulted for evaluation of right calf pain.  Past Medical History:  Diagnosis Date  . Arthritis   . Hypertension 2019   Past Surgical History:  Procedure Laterality Date  . APPENDECTOMY     Social History   Socioeconomic History  . Marital status: Legally Separated    Spouse name: Not on file  . Number of children: Not on file  . Years of education: Not on file  . Highest education level: Not on file  Occupational History  . Not on file  Tobacco Use  . Smoking status: Current Every Day Smoker    Packs/day: 0.50    Years: 11.00    Pack years: 5.50    Types: Cigarettes  . Smokeless tobacco: Never Used  Substance and Sexual Activity  . Alcohol use: No    Comment: one beer every 2-3 months  . Drug use: No  . Sexual activity: Not Currently  Other Topics Concern  . Not on file  Social History Narrative  . Not on file   Social Determinants of Health   Financial Resource Strain:   . Difficulty of Paying Living Expenses:   Food Insecurity:   . Worried About Programme researcher, broadcasting/film/video in the Last Year:   . Barista in the Last Year:   Transportation Needs:   . Freight forwarder (Medical):   [name] Lack of Transportation (Non-Medical):   Physical Activity:   . Days of Exercise per Week:   .  Minutes of Exercise per Session:   Stress:   . Feeling of Stress :   Social Connections:   . Frequency of Communication with Friends and Family:   . Frequency of Social Gatherings with Friends and Family:   . Attends Religious Services:   . Active Member of Clubs or Organizations:   . Attends Banker Meetings:   [name] Marital Status:    Family History  Problem Relation Age of Onset  . Alcohol abuse Mother   . Depression Mother   . Drug abuse Mother   . Physical abuse Mother   . Sexual abuse Mother   . ADD / ADHD Paternal Aunt   . Alcohol abuse Maternal Grandfather   . Bipolar disorder Maternal Grandmother   . Schizophrenia Maternal Grandmother   . Drug abuse Maternal Grandmother   . Alcohol abuse Paternal Grandfather   . Depression Paternal Grandfather   . Depression Paternal Grandmother   . Drug abuse Paternal Grandmother    - negative except otherwise stated in the family history section No Known Allergies Prior to Admission medications   Medication Sig Start Date End Date Taking? Authorizing Provider  atorvastatin (LIPITOR) 10 MG tablet Take 10 mg by mouth daily. 09/25/17  Yes [provider]  citalopram (CELEXA) 40 MG tablet Take 1 tablet (40 mg  total) by mouth daily. 03/19/19  Yes Zena Amos, MD  cloNIDine (CATAPRES) 0.2 MG tablet Take 1 tablet (0.2 mg total) by mouth 3 (three) times daily. 06/16/19  Yes Neysa Hotter, MD  HYDROcodone-acetaminophen (NORCO/VICODIN) 5-325 MG tablet Take 1-2 tablets by mouth every 6 (six) hours as needed for pain. for pain 05/27/19  Yes [provider]  lisinopril (PRINIVIL,ZESTRIL) 10 MG tablet Take 10 mg by mouth daily.   Yes [provider]  lithium carbonate (ESKALITH) 450 MG CR tablet Total of 750 mg daily (400 mg + 300 mg) Patient taking differently: Take 450 mg by mouth See admin instructions. Total of 750 mg daily (450 mg + 300 mg) 06/16/19  Yes Hisada, Barbee Cough, MD  lithium carbonate (LITHOBID) 300 MG CR  tablet Total of 750 mg daily (400 mg + 300 mg) Patient taking differently: Take 300 mg by mouth See admin instructions. Total of 750 mg daily (450 mg + 300 mg) 06/16/19  Yes Hisada, Reina, MD  omeprazole (PRILOSEC) 40 MG capsule Take 40 mg by mouth daily. 05/06/19  Yes [provider]  predniSONE (DELTASONE) 5 MG tablet Take 10-60 mg by mouth as directed. Taper dose (50mg  today) 05/27/19  Yes [provider]  risperidone (RISPERDAL) 4 MG tablet Take 1 tablet (4 mg total) by mouth at bedtime. 06/16/19  Yes 08/16/19, MD  sertraline (ZOLOFT) 50 MG tablet 25 mg daily for one week, then 50 mg daily Patient taking differently: Take 50 mg by mouth daily. 25 mg daily for one week, then 50 mg daily 05/12/19  Yes Hisada, 05/14/19, MD  traMADol (ULTRAM) 50 MG tablet Take 50 mg by mouth every 8 (eight) hours as needed for pain. 05/01/19  Yes [provider]  traZODone (DESYREL) 150 MG tablet Take 1 tablet (150 mg total) by mouth at bedtime. 06/16/19  Yes 08/16/19, MD  cyclobenzaprine (FLEXERIL) 10 MG tablet Take 1 tablet (10 mg total) by mouth 3 (three) times daily as needed for muscle spasms. 05/29/19   05/31/19, MD  naproxen (NAPROSYN) 250 MG tablet Take 2 po BID with food prn pain 05/29/19   05/31/19, MD   DG Chest 2 View  Result Date: 05/30/2019 CLINICAL DATA:  Chest soreness. Additional provided: Right clavicle and right shoulder pain toward neck. EXAM: CHEST - 2 VIEW COMPARISON:  No pertinent prior studies available for comparison. FINDINGS: Heart size within normal limits. No evidence of airspace consolidation within the lungs. No evidence of pleural effusion or pneumothorax. No acute bony abnormality. IMPRESSION: No evidence of acute cardiopulmonary abnormality. Electronically Signed   By: 06/01/2019 DO   On: 05/30/2019 11:23   CT Angio Chest PE W and/or Wo Contrast  Result Date: 05/30/2019 CLINICAL DATA:  Shortness of breath. EXAM: CT ANGIOGRAPHY CHEST WITH CONTRAST  TECHNIQUE: Multidetector CT imaging of the chest was performed using the standard protocol during bolus administration of intravenous contrast. Multiplanar CT image reconstructions and MIPs were obtained to evaluate the vascular anatomy. CONTRAST:  06/01/2019 OMNIPAQUE IOHEXOL 350 MG/ML SOLN COMPARISON:  None. FINDINGS: Cardiovascular: Satisfactory opacification of the pulmonary arteries to the segmental level. No evidence of pulmonary embolism. Normal heart size. No pericardial effusion. Mediastinum/Nodes: No enlarged mediastinal, hilar, or axillary lymph nodes. Thyroid gland, trachea, and esophagus demonstrate no significant findings. Lungs/Pleura: Lungs are clear. No pleural effusion or pneumothorax. Upper Abdomen: No acute abnormality. Musculoskeletal: There appears to be soft tissue prominence around the right sternoclavicular joint with mild inflammatory changes suggesting inflammation of  the joint space or possibly septic arthritis. Alternatively it may represent some form of neoplasm. Review of the MIP images confirms the above findings. IMPRESSION: No definite evidence of pulmonary embolus. Soft tissue prominence and inflammatory changes are noted around the right sternoclavicular joint suggesting possible septic arthritis or other inflammation of the joint space. Alternatively it may represent neoplasm. MRI is recommended for further evaluation. Electronically Signed   By: Lupita Raider M.D.   On: 05/30/2019 16:18   CT TIBIA FIBULA RIGHT W CONTRAST  Result Date: 05/30/2019 CLINICAL DATA:  Right lower extremity pain and swelling. EXAM: CT OF THE LOWER RIGHT EXTREMITY WITH CONTRAST TECHNIQUE: Multidetector CT imaging of the lower right extremity was performed according to the standard protocol following intravenous contrast administration. COMPARISON:  None. CONTRAST:  OMNIPAQUE IOHEXOL 350 MG/ML SOLN FINDINGS: There is a moderate-sized knee joint effusion with typical rim like synovial enhancement. I  do not see any destructive bony changes to suggest septic arthritis. There is a fluid collection in the popliteal fossa which may be fluid tracking back along the popliteus tendon. It is not a Baker's cyst. There is also fluid between the gastroc and soleus muscles and fluid tracking down along the medial soleus muscle. Findings could be due to a ruptured cyst. Mild subcutaneous soft tissue swelling/edema but no discrete fluid collection to suggest an abscess. No findings to suggest myofasciitis or pyomyositis. The major venous structures appear patent. I do not see any definite findings for deep venous thrombosis. The bony structures are unremarkable. IMPRESSION: 1. Moderate-sized knee joint effusion with typical rim like synovial enhancement. I do not see any destructive cartilage or bony changes to suggest septic arthritis. 2. Fluid collection in the popliteal fossa may be fluid tracking back along the popliteus tendon. It is not a Baker's cyst. 3. Fluid tracking down along the medial soleus muscle and between the gastroc and soleus muscles. Findings could be due to a ruptured cyst. 4. No findings to suggest myofasciitis or pyomyositis. 5. Patent major venous structures. Electronically Signed   By: Rudie Meyer M.D.   On: 05/30/2019 16:09   VAS [place] LOWER EXTREMITY VENOUS (DVT) (ONLY [facility] & [facility] 7a-7p)  Result Date: 05/30/2019  Lower Venous DVTStudy Indications: Pain, and Erythema.  Comparison Study: no prior Performing Technologist: Jeb Levering RDMS, RVT  Examination Guidelines: A complete evaluation includes B-mode imaging, spectral Doppler, color Doppler, and power Doppler as needed of all accessible portions of each vessel. Bilateral testing is considered an integral part of a complete examination. Limited examinations for reoccurring indications may be performed as noted. The reflux portion of the exam is performed with the patient in reverse Trendelenburg.   +---------+---------------+---------+-----------+----------+--------------+ RIGHT    CompressibilityPhasicitySpontaneityProperties               +---------+---------------+---------+-----------+----------+--------------+ CFV                                                   Not visualized +---------+---------------+---------+-----------+----------+--------------+ FV Prox  Full           Yes      Yes                                 +---------+---------------+---------+-----------+----------+--------------+ FV Mid   Full                                                        +---------+---------------+---------+-----------+----------+--------------+  FV DistalFull                                                        +---------+---------------+---------+-----------+----------+--------------+ PFV      Full                                                        +---------+---------------+---------+-----------+----------+--------------+ POP      Full           Yes      Yes                                 +---------+---------------+---------+-----------+----------+--------------+ PTV      Full                                                        +---------+---------------+---------+-----------+----------+--------------+ PERO     Full                                                        +---------+---------------+---------+-----------+----------+--------------+ Limited due to clothing interference  +----+---------------+---------+-----------+----------+--------------+ LEFTCompressibilityPhasicitySpontaneityProperties               +----+---------------+---------+-----------+----------+--------------+ CFV                                              Not visualized +----+---------------+---------+-----------+----------+--------------+     Summary: RIGHT: - There is no evidence of deep vein thrombosis in the lower extremity. However, portions of  this examination were limited- see technologist comments above.  - No cystic structure found in the popliteal fossa. - Interstitial fluid noted in mid calf.   *See table(s) above for measurements and observations. Electronically signed by Sherald Hess MD on 05/30/2019 at 4:05:18 PM.    Final    - pertinent xrays, CT, MRI studies were reviewed and independently interpreted  Positive ROS: All other systems have been reviewed and were otherwise negative with the exception of those mentioned in the HPI and as above.  Physical Exam: General: No acute distress Cardiovascular: No pedal edema Respiratory: No cyanosis, no use of accessory musculature GI: No organomegaly, abdomen is soft and non-tender Skin: No lesions in the area of chief complaint Neurologic: Sensation intact distally Psychiatric: Patient is at baseline mood and affect Lymphatic: No axillary or cervical lymphadenopathy  MUSCULOSKELETAL:  Right calf shows significant swelling and tightness. He has moderate pain with passive stretch of the calf and the knee. He has strong distal pulses. No dysesthesias. The calf does feel warm to touch. Skin has patchy red spots.  Assessment: Right calf swelling and tenderness concerning for deep abscess  Plan: I agree with decision to  order a stat MRI of the right tib-fib to evaluate for deep infection. I have made him n.p.o. after midnight in case he needs a washout in the operating room. I will follow up with the MRI.  Thank you for the consult and the opportunity to see Herbert Walsh. Eduard Roux, MD [facility] 11:01 PM

## 2019-05-30 NOTE — Progress Notes (Signed)
Lower venous duplex       has been completed. Preliminary results can be found under CV proc through chart review. Manessa Buley, BS, RDMS, RVT   

## 2019-05-30 NOTE — Consult Note (Signed)
[address]       [place] [address]             [phone]        Ikechukwu Herbert Walsh [facility] Medical Record #[id number] Date of Birth: February 09, 1991  Referring: No ref. provider found Primary Care: Ignatius Specking, MD Primary Cardiologist:No primary care provider on file.  Chief Complaint:    Chief Complaint  Patient presents with  . Leg Swelling  Patient examined and CTA of chest images personally reviewed and discussed with patient and family  History of Present Illness:     29 year old obese prediabetic male with recent diagnosis of rheumatoid arthritis presents with pain and tenderness and redness over the right sternoclavicular joint.  He has had symptoms for a few days treated with anti-inflammatory agents.  He presented to the ED from a rheumatology office with elevated temperature and leukocytosis 19 K.  CT shows soft tissue density without fluid collection in the region of the right sternoclavicular joint.  No pleural effusion, no pericardial effusion and lung fields are clear.  The patient is a smoker without previous surgical history other than appendectomy.  He is right-hand dominant.  No history of thoracic trauma.  Patient has been started on IV antibiotics and blood cultures are pending.  He also has pain in tenderness and cellulitis in the right posterior knee extending to the calf. Current Activity/ Functional Status: Patient lives with his wife, has emotional psychological issues and followed by psychiatry   Zubrod Score: At the time of surgery this patient's most appropriate activity status/level should be described as:     0    Normal activity, no symptoms     1    Restricted in physical strenuous activity but ambulatory, able to do out light work     2    Ambulatory and capable of self care, unable to do work activities, up and about                 more than 50%  Of the time                                3    Only limited self care, in  bed greater than 50% of waking hours     4    Completely disabled, no self care, confined to bed or chair     5    Moribund  Past Medical History:  Diagnosis Date  . Arthritis   . Hypertension 2019    Past Surgical History:  Procedure Laterality Date  . APPENDECTOMY      Social History   Tobacco Use  Smoking Status Current Every Day Smoker  . Packs/day: 0.50  . Years: 11.00  . Pack years: 5.50  . Types: Cigarettes  Smokeless Tobacco Never Used    Social History   Substance and Sexual Activity  Alcohol Use No   Comment: one beer every 2-3 months     No Known Allergies  Current Facility-Administered Medications  Medication Dose Route Frequency Provider Last Rate Last Admin  . sodium chloride 0.9 % bolus 500 mL  500 mL Intravenous Once Nanavati, Ankit, MD      . vancomycin (VANCOCIN) 2,500 mg in sodium chloride 0.9 % 500 mL IVPB  2,500 mg Intravenous Once Tama Headings, RPH      . [START ON 05/31/2019] vancomycin (VANCOREADY) IVPB  1750 mg/350 mL  1,750 mg Intravenous Q12H Tama Headings, [facility]       Current Outpatient Medications  Medication Sig Dispense Refill  . atorvastatin (LIPITOR) 10 MG tablet Take 10 mg by mouth daily.  12  . citalopram (CELEXA) 40 MG tablet Take 1 tablet (40 mg total) by mouth daily. 90 tablet 0  . [START ON 06/16/2019] cloNIDine (CATAPRES) 0.2 MG tablet Take 1 tablet (0.2 mg total) by mouth 3 (three) times daily. 270 tablet 0  . HYDROcodone-acetaminophen (NORCO/VICODIN) 5-325 MG tablet Take 1-2 tablets by mouth every 6 (six) hours as needed for pain. for pain    . lisinopril (PRINIVIL,ZESTRIL) 10 MG tablet Take 10 mg by mouth daily.    [name] ON 06/16/2019] lithium carbonate (ESKALITH) 450 MG CR tablet Total of 750 mg daily (400 mg + 300 mg) (Patient taking differently: Take 450 mg by mouth See admin instructions. Total of 750 mg daily (450 mg + 300 mg)) 90 tablet 0  . [START ON 06/16/2019] lithium carbonate (LITHOBID) 300 MG CR tablet Total of 750  mg daily (400 mg + 300 mg) (Patient taking differently: Take 300 mg by mouth See admin instructions. Total of 750 mg daily (450 mg + 300 mg)) 90 tablet 0  . omeprazole (PRILOSEC) 40 MG capsule Take 40 mg by mouth daily.    . predniSONE (DELTASONE) 5 MG tablet Take 10-60 mg by mouth as directed. Taper dose (50mg  today)    . [START ON 06/16/2019] risperidone (RISPERDAL) 4 MG tablet Take 1 tablet (4 mg total) by mouth at bedtime. 90 tablet 0  . sertraline (ZOLOFT) 50 MG tablet 25 mg daily for one week, then 50 mg daily (Patient taking differently: Take 50 mg by mouth daily. 25 mg daily for one week, then 50 mg daily) 30 tablet 1  . traMADol (ULTRAM) 50 MG tablet Take 50 mg by mouth every 8 (eight) hours as needed for pain.    08/16/2019 ON 06/16/2019] traZODone (DESYREL) 150 MG tablet Take 1 tablet (150 mg total) by mouth at bedtime. 90 tablet 0  . cyclobenzaprine (FLEXERIL) 10 MG tablet Take 1 tablet (10 mg total) by mouth 3 (three) times daily as needed for muscle spasms. 30 tablet 0  . naproxen (NAPROSYN) 250 MG tablet Take 2 po BID with food prn pain 40 tablet 0    (Not in a hospital admission)   Family History  Problem Relation Age of Onset  . Alcohol abuse Mother   . Depression Mother   . Drug abuse Mother   . Physical abuse Mother   . Sexual abuse Mother   . ADD / ADHD Paternal Aunt   . Alcohol abuse Maternal Grandfather   . Bipolar disorder Maternal Grandmother   . Schizophrenia Maternal Grandmother   . Drug abuse Maternal Grandmother   . Alcohol abuse Paternal Grandfather   . Depression Paternal Grandfather   . Depression Paternal Grandmother   . Drug abuse Paternal Grandmother      Review of Systems:   ROS      Cardiac Review of Systems: Y or  [    ]= no  Chest Pain [ y right shoulder   ]  Resting SOB [   ] Exertional SOB  [  ]  Orthopnea [  ]   Pedal Edema [   ]    Palpitations [  ] Syncope  [  ]   Presyncope [   ]  General Review of Systems: [Y] =  yes [   ]=no Constitional: recent weight change [  ]; anorexia [place]ienne  ]; fatigue [  ]; nausea [  ]; night sweats [  ]; fever [ y ]; or chills [  ]                                                               Dental: Last Dentist visit: Poor dental condition  Eye : blurred vision [  ]; diplopia [   ]; vision changes [  ];  Amaurosis fugax[  ]; Resp: cough [  ];  wheezing[  ];  hemoptysis[  ]; shortness of breath[  ]; paroxysmal nocturnal dyspnea[  ]; dyspnea on exertion[  ]; or orthopnea[  ];  GI:  gallstones[  ], vomiting[  ];  dysphagia[  ]; melena[  ];  hematochezia [  ]; heartburn[  ];   Hx of  Colonoscopy[  ]; GU: kidney stones [  ]; hematuria[  ];   dysuria [  ];  nocturia[  ];  history of     obstruction [  ]; urinary frequency [  ]             Skin: rash, swelling[  ];, hair loss[  ];  peripheral edema[  ];  or itching[  ]; Musculosketetal: myalgias[  ];  joint swelling[y  ];  joint erythema[ y ];  joint pain[  ];  back pain[  ];  Heme/Lymph: bruising[  ];  bleeding[  ];  anemia[  ];  Neuro: TIA[  ];  headaches[  ];  stroke[  ];  vertigo[  ];  seizures[  ];   paresthesias[  ];  difficulty walking[  ];  Psych:depression[  ]; anxiety[  ];  Endocrine: diabetes[y  ];  thyroid dysfunction[  ];                         Physical Exam: BP (!) 151/79   Pulse (!) 132   Temp 99.3 F (37.4 C) (Oral)   Resp (!) 26   Ht 6' (1.829 m)   Wt 127 kg   SpO2 92%   BMI 37.97 kg/m        Physical Exam  General: Obese Caucasian male with flushed facies and tenderness around right anterior shoulder and right posterior knee HEENT: Normocephalic pupils equal , dentition adequate Neck: Supple without JVD, adenopathy, or bruit Chest: Clear to auscultation, symmetrical breath sounds, no rhonchi, positive for tenderness and swelling in the area of the right sternoclavicular joint but without fluctuance or deformity             Cardiovascular: Regular rate and rhythm, no murmur, no gallop, peripheral pulses              palpable in all extremities Abdomen:  Soft, nontender, no palpable mass or organomegaly Extremities: Warm, well-perfused, no clubbing cyanosis edema or tenderness,              no venous stasis changes of the legs Rectal/GU: Deferred Neuro: Grossly non--focal and symmetrical throughout Skin: Clean and dry without rash or ulceration   Diagnostic Studies & Laboratory data:     Recent Radiology Findings:   DG Chest 2 View  Result Date: 05/30/2019 CLINICAL DATA:  Chest soreness. Additional provided: Right clavicle and right shoulder pain toward neck. EXAM: CHEST - 2 VIEW COMPARISON:  No pertinent prior studies available for comparison. FINDINGS: Heart size within normal limits. No evidence of airspace consolidation within the lungs. No evidence of pleural effusion or pneumothorax. No acute bony abnormality. IMPRESSION: No evidence of acute cardiopulmonary abnormality. Electronically Signed   By: Kellie Simmering DO   On: 05/30/2019 11:23   CT Angio Chest PE W and/or Wo Contrast  Result Date: 05/30/2019 CLINICAL DATA:  Shortness of breath. EXAM: CT ANGIOGRAPHY CHEST WITH CONTRAST TECHNIQUE: Multidetector CT imaging of the chest was performed using the standard protocol during bolus administration of intravenous contrast. Multiplanar CT image reconstructions and MIPs were obtained to evaluate the vascular anatomy. CONTRAST:  133mL OMNIPAQUE IOHEXOL 350 MG/ML SOLN COMPARISON:  None. FINDINGS: Cardiovascular: Satisfactory opacification of the pulmonary arteries to the segmental level. No evidence of pulmonary embolism. Normal heart size. No pericardial effusion. Mediastinum/Nodes: No enlarged mediastinal, hilar, or axillary lymph nodes. Thyroid gland, trachea, and esophagus demonstrate no significant findings. Lungs/Pleura: Lungs are clear. No pleural effusion or pneumothorax. Upper Abdomen: No acute abnormality. Musculoskeletal: There appears to be soft tissue prominence around the right  sternoclavicular joint with mild inflammatory changes suggesting inflammation of the joint space or possibly septic arthritis. Alternatively it may represent some form of neoplasm. Review of the MIP images confirms the above findings. IMPRESSION: No definite evidence of pulmonary embolus. Soft tissue prominence and inflammatory changes are noted around the right sternoclavicular joint suggesting possible septic arthritis or other inflammation of the joint space. Alternatively it may represent neoplasm. MRI is recommended for further evaluation. Electronically Signed   By: Marijo Conception M.D.   On: 05/30/2019 16:18   CT TIBIA FIBULA RIGHT W CONTRAST  Result Date: 05/30/2019 CLINICAL DATA:  Right lower extremity pain and swelling. EXAM: CT OF THE LOWER RIGHT EXTREMITY WITH CONTRAST TECHNIQUE: Multidetector CT imaging of the lower right extremity was performed according to the standard protocol following intravenous contrast administration. COMPARISON:  None. CONTRAST:  119mL OMNIPAQUE IOHEXOL 350 MG/ML SOLN FINDINGS: There is a moderate-sized knee joint effusion with typical rim like synovial enhancement. I do not see any destructive bony changes to suggest septic arthritis. There is a fluid collection in the popliteal fossa which may be fluid tracking back along the popliteus tendon. It is not a Baker's cyst. There is also fluid between the gastroc and soleus muscles and fluid tracking down along the medial soleus muscle. Findings could be due to a ruptured cyst. Mild subcutaneous soft tissue swelling/edema but no discrete fluid collection to suggest an abscess. No findings to suggest myofasciitis or pyomyositis. The major venous structures appear patent. I do not see any definite findings for deep venous thrombosis. The bony structures are unremarkable. IMPRESSION: 1. Moderate-sized knee joint effusion with typical rim like synovial enhancement. I do not see any destructive cartilage or bony changes to suggest  septic arthritis. 2. Fluid collection in the popliteal fossa may be fluid tracking back along the popliteus tendon. It is not a Baker's cyst. 3. Fluid tracking down along the medial soleus muscle and between the gastroc and soleus muscles. Findings could be due to a ruptured cyst. 4. No findings to suggest myofasciitis or pyomyositis. 5. Patent major venous structures. Electronically Signed   By: Marijo Sanes M.D.   On: 05/30/2019 16:09   VAS [place] LOWER EXTREMITY VENOUS (DVT) (ONLY [facility] & [facility] 7a-7p)  Result Date: 05/30/2019  Lower Venous  DVTStudy Indications: Pain, and Erythema.  Comparison Study: no prior Performing Technologist: Jeb Levering RDMS, RVT  Examination Guidelines: A complete evaluation includes B-mode imaging, spectral Doppler, color Doppler, and power Doppler as needed of all accessible portions of each vessel. Bilateral testing is considered an integral part of a complete examination. Limited examinations for reoccurring indications may be performed as noted. The reflux portion of the exam is performed with the patient in reverse Trendelenburg.  +---------+---------------+---------+-----------+----------+--------------+ RIGHT    CompressibilityPhasicitySpontaneityProperties               +---------+---------------+---------+-----------+----------+--------------+ CFV                                                   Not visualized +---------+---------------+---------+-----------+----------+--------------+ FV Prox  Full           Yes      Yes                                 +---------+---------------+---------+-----------+----------+--------------+ FV Mid   Full                                                        +---------+---------------+---------+-----------+----------+--------------+ FV DistalFull                                                        +---------+---------------+---------+-----------+----------+--------------+ PFV      Full                                                         +---------+---------------+---------+-----------+----------+--------------+ POP      Full           Yes      Yes                                 +---------+---------------+---------+-----------+----------+--------------+ PTV      Full                                                        +---------+---------------+---------+-----------+----------+--------------+ PERO     Full                                                        +---------+---------------+---------+-----------+----------+--------------+ Limited due to clothing interference  +----+---------------+---------+-----------+----------+--------------+ LEFTCompressibilityPhasicitySpontaneityProperties               +----+---------------+---------+-----------+----------+--------------+ CFV  Not visualized +----+---------------+---------+-----------+----------+--------------+     Summary: RIGHT: - There is no evidence of deep vein thrombosis in the lower extremity. However, portions of this examination were limited- see technologist comments above.  - No cystic structure found in the popliteal fossa. - Interstitial fluid noted in mid calf.   *See table(s) above for measurements and observations. Electronically signed by Sherald Hess MD on 05/30/2019 at 4:05:18 PM.    Final      I have independently reviewed the above radiologic studies and discussed with the patient   Recent Lab Findings: Lab Results  Component Value Date   WBC 19.8 (H) 05/30/2019   HGB 12.7 (L) 05/30/2019   HCT 40.8 05/30/2019   PLT 377 05/30/2019   GLUCOSE 268 (H) 05/30/2019   CHOL 189 07/07/2016   TRIG 158 (H) 07/07/2016   HDL 37 (L) 07/07/2016   LDLCALC 120 (H) 07/07/2016   ALT 21 07/07/2016   AST 19 07/07/2016   NA 128 (L) 05/30/2019   K 4.1 05/30/2019   CL 94 (L) 05/30/2019   CREATININE 0.95 05/30/2019   BUN 8 05/30/2019   CO2 20 (L)  05/30/2019   TSH 1.76 08/06/2018   INR 1.1 05/30/2019      Assessment / Plan:   Probable infection of the right sternoclavicular joint without abscess formation-nothing to be drained and patient does not meet criteria for surgery at this time.  Best treatment now is IV antibiotics and follow blood cultures.  We will check his dental x-rays as a possible source of  bacteremia.  Patient will need echocardiogram at some point.  Will follow.       @ME1 @ 05/30/2019 7:28 PM

## 2019-05-30 NOTE — ED Notes (Signed)
Pt transported to CT ?

## 2019-05-30 NOTE — H&P (Signed)
Date: 05/30/2019               Patient Name:  Herbert Walsh MRN: [id number]  DOB: 1990-07-12 Age / Sex: 29 y.o., male   PCP: Glenda Chroman, MD         Medical Service: Internal Medicine Teaching Service         Attending Physician: Dr. Evette Doffing, Mallie Mussel, *    First Contact: Dr. Court Joy Pager: [phone]  Second Contact: Dr. Truman Hayward Pager: [phone]       After Hours (After 5p/  First Contact Pager: [phone]  weekends / holidays): Second Contact Pager: [phone]   Chief Complaint: leg swelling  History of Present Illness:  Herbert Walsh is a 29 yo M w/ a PMHx of HTN, HLD, recently diagnosed rheumatoid arthritis, PTSD, depression, anxiety, bipolar disorder and intermittent explosive disorder who presents with R LE swelling/cramping and right sided chest pain.   Herbert Walsh states that he was in his usual state of health until 5 days ago when he began experiencing right shoulder pain which migrated to his neck. A couple days afterwards, he began noticing right leg pain and swelling. The patient reports it was initially thought to be RA related but sx did not improve with steroids. He states he was diagnosed with RA in February, after a few days of pain in his hands. He said the pain lasted all day and that he had associated stiffness as well. His PCP checked a rheumatoid factor which was very elevated and consistent with RA for which he was prescribed prednisone and tramadol which resolved his sx within hours. These sx have not recurred since and he has not been taking tramadol or prednisone until the last week. He initially presented to urgent care for his sx which were thought to be RA related so he was given a decadron shot and some pain medication which did not resolve his sx. In fact, his sx got progressively worse. He presented to the ER yesterday at which time he was given naproxen and flexeril without relief. His sx are getting worse and not responsive to any medications as of yet. He reports  progressive redness and swelling of the R LE without associated trauma, rashes or bug bites. His right chest pain is also associated with swelling and overlying erythema.  Generally, he denies fevers, chills, chest pain, abdominal pain, diarrhea, hematochezia, dysuria, urinary frequency, urgency, penile discharge, testicular swelling, falls, LOC, trauma, skin changes, mood changes and bug bites. He does endorse sore throat which he attributes to thirst and shortness of breath associated with having to take shallow breaths due to his right sided chest pain. He also endorses a constant mild holocranial headache which he attributes to stress.   Meds:  Current Meds  Medication Sig  . atorvastatin (LIPITOR) 10 MG tablet Take 10 mg by mouth daily.  . citalopram (CELEXA) 40 MG tablet Take 1 tablet (40 mg total) by mouth daily.  Derrill Memo ON 06/16/2019] cloNIDine (CATAPRES) 0.2 MG tablet Take 1 tablet (0.2 mg total) by mouth 3 (three) times daily.  [name] HYDROcodone-acetaminophen (NORCO/VICODIN) 5-325 MG tablet Take 1-2 tablets by mouth every 6 (six) hours as needed for pain. for pain  . lisinopril (PRINIVIL,ZESTRIL) 10 MG tablet Take 10 mg by mouth daily.  Derrill Memo ON 06/16/2019] lithium carbonate (ESKALITH) 450 MG CR tablet Total of 750 mg daily (400 mg + 300 mg) (Patient taking differently: Take 450 mg by mouth See admin instructions. Total  of 750 mg daily (450 mg + 300 mg))  . [START ON 06/16/2019] lithium carbonate (LITHOBID) 300 MG CR tablet Total of 750 mg daily (400 mg + 300 mg) (Patient taking differently: Take 300 mg by mouth See admin instructions. Total of 750 mg daily (450 mg + 300 mg))  . omeprazole (PRILOSEC) 40 MG capsule Take 40 mg by mouth daily.  . predniSONE (DELTASONE) 5 MG tablet Take 10-60 mg by mouth as directed. Taper dose (50mg  today)  . [START ON 06/16/2019] risperidone (RISPERDAL) 4 MG tablet Take 1 tablet (4 mg total) by mouth at bedtime.  . sertraline (ZOLOFT) 50 MG tablet 25 mg daily for  one week, then 50 mg daily (Patient taking differently: Take 50 mg by mouth daily. 25 mg daily for one week, then 50 mg daily)  . traMADol (ULTRAM) 50 MG tablet Take 50 mg by mouth every 8 (eight) hours as needed for pain.  08/16/2019 ON 06/16/2019] traZODone (DESYREL) 150 MG tablet Take 1 tablet (150 mg total) by mouth at bedtime.   Allergies: Allergies as of 05/30/2019  . (No Known Allergies)   Past Medical History:  Diagnosis Date  . Arthritis   . Hypertension 2019  HLD Recently diagnosed RA Hx of Appendicitis   Family History:  Family History  Problem Relation Age of Onset  . Alcohol abuse Mother   . Depression Mother   . Drug abuse Mother   . Physical abuse Mother   . Sexual abuse Mother   . ADD / ADHD Paternal Aunt   . Alcohol abuse Maternal Grandfather   . Bipolar disorder Maternal Grandmother   . Schizophrenia Maternal Grandmother   . Drug abuse Maternal Grandmother   . Alcohol abuse Paternal Grandfather   . Depression Paternal Grandfather   . Depression Paternal Grandmother   . Drug abuse Paternal Grandmother   Hx of depression in his family  Social History:  Social History   Tobacco Use  . Smoking status: Current Every Day Smoker    Packs/day: 0.50    Years: 11.00    Pack years: 5.50    Types: Cigarettes  . Smokeless tobacco: Never Used  Substance Use Topics  . Alcohol use: No    Comment: one beer every 2-3 months  . Drug use: No   -unemployed and helps his mom around the house -lives with mom -Smokes 10 cigarettes a day -does not drink -does not use drugs -Last sexual encounter >2 years ago   Review of Systems: A complete ROS was negative except as per HPI.   Physical Exam: Blood pressure (!) 153/91, pulse (!) 127, temperature 99.6 F (37.6 C), temperature source Oral, resp. rate (!) 34, height 6' (1.829 m), weight 127 kg, SpO2 94 %. Physical Exam  Constitutional: He is oriented to person, place, and time. He appears distressed.  Obese male    HENT:  Head: Normocephalic and atraumatic.  Eyes: Pupils are equal, round, and reactive to light. Conjunctivae are normal.  Cardiovascular: Regular rhythm.  No murmur heard. tachy  Pulmonary/Chest: Breath sounds normal. He has no wheezes. He has no rales. He exhibits tenderness.  Rapid, shallow breathing; tenderness over the right sternoclavicular joint with overlying swelling and patchy erythema, warm to touch  Abdominal: Soft. Bowel sounds are normal. He exhibits no distension.  Musculoskeletal:     Cervical back: Normal range of motion.     Comments: R LE swollen with overlying redness, warm to touch, appears taught  Neurological: He is alert  and oriented to person, place, and time.  Skin: Skin is warm. He is diaphoretic.  Psychiatric: Mood, affect and judgment normal.  Nursing note and vitals reviewed.  Labs: Results for orders placed or performed during the hospital encounter of 05/30/19 (from the past 24 hour(s))  Basic metabolic panel     Status: Abnormal   Collection Time: 05/30/19 10:50 AM  Result Value Ref Range   Sodium 128 (L) 135 - 145 mmol/L   Potassium 4.1 3.5 - 5.1 mmol/L   Chloride 94 (L) 98 - 111 mmol/L   CO2 20 (L) 22 - 32 mmol/L   Glucose, Bld 268 (H) 70 - 99 mg/dL   BUN 8 6 - 20 mg/dL   Creatinine, Ser 8.34 0.61 - 1.24 mg/dL   Calcium 9.3 8.9 - 19.6 mg/dL   GFR calc non Af Amer >60 >60 mL/min   GFR calc Af Amer >60 >60 mL/min   Anion gap 14 5 - 15  CBC     Status: Abnormal   Collection Time: 05/30/19 10:50 AM  Result Value Ref Range   WBC 19.8 (H) 4.0 - 10.5 K/uL   RBC 5.13 4.22 - 5.81 MIL/uL   Hemoglobin 12.7 (L) 13.0 - 17.0 g/dL   HCT 22.2 97.9 - 89.2 %   MCV 79.5 (L) 80.0 - 100.0 fL   MCH 24.8 (L) 26.0 - 34.0 pg   MCHC 31.1 30.0 - 36.0 g/dL   RDW 11.9 (H) 41.7 - 40.8 %   Platelets 377 150 - 400 K/uL   nRBC 0.0 0.0 - 0.2 %  Troponin I (High Sensitivity)     Status: None   Collection Time: 05/30/19 10:50 AM  Result Value Ref Range   Troponin I  (High Sensitivity) 4 <18 ng/L  Lithium level     Status: None   Collection Time: 05/30/19  3:11 PM  Result Value Ref Range   Lithium Lvl 0.61 0.60 - 1.20 mmol/L  Urinalysis, Routine w reflex microscopic     Status: Abnormal   Collection Time: 05/30/19  3:13 PM  Result Value Ref Range   Color, Urine AMBER (A) YELLOW   APPearance HAZY (A) CLEAR   Specific Gravity, Urine 1.043 (H) 1.005 - 1.030   pH 6.0 5.0 - 8.0   Glucose, UA >=500 (A) NEGATIVE mg/dL   Hgb urine dipstick NEGATIVE NEGATIVE   Bilirubin Urine NEGATIVE NEGATIVE   Ketones, ur NEGATIVE NEGATIVE mg/dL   Protein, ur 144 (A) NEGATIVE mg/dL   Nitrite NEGATIVE NEGATIVE   Leukocytes,Ua NEGATIVE NEGATIVE   RBC / HPF 0-5 0 - 5 RBC/hpf   WBC, UA 0-5 0 - 5 WBC/hpf   Bacteria, UA RARE (A) NONE SEEN   Mucus PRESENT    Hyaline Casts, UA PRESENT    Cellular Cast, UA 7   SARS CORONAVIRUS 2 (TAT 6-24 HRS) Nasopharyngeal Nasopharyngeal Swab     Status: None   Collection Time: 05/30/19  3:14 PM   Specimen: Nasopharyngeal Swab  Result Value Ref Range   SARS Coronavirus 2 NEGATIVE NEGATIVE  APTT     Status: None   Collection Time: 05/30/19  3:20 PM  Result Value Ref Range   aPTT 36 24 - 36 seconds  Protime-INR     Status: None   Collection Time: 05/30/19  3:20 PM  Result Value Ref Range   Prothrombin Time 14.2 11.4 - 15.2 seconds   INR 1.1 0.8 - 1.2  Troponin I (High Sensitivity)     Status: None  Collection Time: 05/30/19  3:30 PM  Result Value Ref Range   Troponin I (High Sensitivity) 5 <18 ng/L  CK     Status: Abnormal   Collection Time: 05/30/19  3:30 PM  Result Value Ref Range   Total CK 1,475 (H) 49 - 397 U/L  Lactic acid, plasma     Status: Abnormal   Collection Time: 05/30/19  4:35 PM  Result Value Ref Range   Lactic Acid, Venous 4.2 (HH) 0.5 - 1.9 mmol/L  Respiratory Panel by RT PCR (Flu A&B, Covid) - Nasopharyngeal Swab     Status: None   Collection Time: 05/30/19  5:07 PM   Specimen: Nasopharyngeal Swab  Result  Value Ref Range   SARS Coronavirus 2 by RT PCR NEGATIVE NEGATIVE   Influenza A by PCR NEGATIVE NEGATIVE   Influenza B by PCR NEGATIVE NEGATIVE  Lactic acid, plasma     Status: Abnormal   Collection Time: 05/30/19  6:32 PM  Result Value Ref Range   Lactic Acid, Venous 2.5 (HH) 0.5 - 1.9 mmol/L  Sedimentation rate     Status: Abnormal   Collection Time: 05/30/19  6:32 PM  Result Value Ref Range   Sed Rate 90 (H) 0 - 16 mm/hr  C-reactive protein     Status: Abnormal   Collection Time: 05/30/19  6:32 PM  Result Value Ref Range   CRP 39.8 (H) <1.0 mg/dL   EKG: personally reviewed my interpretation is sinus tach.  CXR: personally reviewed my interpretation is no acute cardiopulmonary abnormality.  DG Chest 2 View  Result Date: 05/30/2019 CLINICAL DATA:  Chest soreness. Additional provided: Right clavicle and right shoulder pain toward neck. EXAM: CHEST - 2 VIEW COMPARISON:  No pertinent prior studies available for comparison. FINDINGS: Heart size within normal limits. No evidence of airspace consolidation within the lungs. No evidence of pleural effusion or pneumothorax. No acute bony abnormality. IMPRESSION: No evidence of acute cardiopulmonary abnormality. Electronically Signed   By: Jackey Loge DO   On: 05/30/2019 11:23   CT Angio Chest PE W and/or Wo Contrast  Result Date: 05/30/2019 CLINICAL DATA:  Shortness of breath. EXAM: CT ANGIOGRAPHY CHEST WITH CONTRAST TECHNIQUE: Multidetector CT imaging of the chest was performed using the standard protocol during bolus administration of intravenous contrast. Multiplanar CT image reconstructions and MIPs were obtained to evaluate the vascular anatomy. CONTRAST:  OMNIPAQUE IOHEXOL 350 MG/ML SOLN COMPARISON:  None. FINDINGS: Cardiovascular: Satisfactory opacification of the pulmonary arteries to the segmental level. No evidence of pulmonary embolism. Normal heart size. No pericardial effusion. Mediastinum/Nodes: No enlarged mediastinal, hilar,  or axillary lymph nodes. Thyroid gland, trachea, and esophagus demonstrate no significant findings. Lungs/Pleura: Lungs are clear. No pleural effusion or pneumothorax. Upper Abdomen: No acute abnormality. Musculoskeletal: There appears to be soft tissue prominence around the right sternoclavicular joint with mild inflammatory changes suggesting inflammation of the joint space or possibly septic arthritis. Alternatively it may represent some form of neoplasm. Review of the MIP images confirms the above findings. IMPRESSION: No definite evidence of pulmonary embolus. Soft tissue prominence and inflammatory changes are noted around the right sternoclavicular joint suggesting possible septic arthritis or other inflammation of the joint space. Alternatively it may represent neoplasm. MRI is recommended for further evaluation. Electronically Signed   By: Lupita Raider M.D.   On: 05/30/2019 16:18   CT TIBIA FIBULA RIGHT W CONTRAST  Result Date: 05/30/2019 CLINICAL DATA:  Right lower extremity pain and swelling. EXAM: CT OF THE LOWER  RIGHT EXTREMITY WITH CONTRAST TECHNIQUE: Multidetector CT imaging of the lower right extremity was performed according to the standard protocol following intravenous contrast administration. COMPARISON:  None. CONTRAST:  OMNIPAQUE IOHEXOL 350 MG/ML SOLN FINDINGS: There is a moderate-sized knee joint effusion with typical rim like synovial enhancement. I do not see any destructive bony changes to suggest septic arthritis. There is a fluid collection in the popliteal fossa which may be fluid tracking back along the popliteus tendon. It is not a Baker's cyst. There is also fluid between the gastroc and soleus muscles and fluid tracking down along the medial soleus muscle. Findings could be due to a ruptured cyst. Mild subcutaneous soft tissue swelling/edema but no discrete fluid collection to suggest an abscess. No findings to suggest myofasciitis or pyomyositis. The major venous  structures appear patent. I do not see any definite findings for deep venous thrombosis. The bony structures are unremarkable. IMPRESSION: 1. Moderate-sized knee joint effusion with typical rim like synovial enhancement. I do not see any destructive cartilage or bony changes to suggest septic arthritis. 2. Fluid collection in the popliteal fossa may be fluid tracking back along the popliteus tendon. It is not a Baker's cyst. 3. Fluid tracking down along the medial soleus muscle and between the gastroc and soleus muscles. Findings could be due to a ruptured cyst. 4. No findings to suggest myofasciitis or pyomyositis. 5. Patent major venous structures. Electronically Signed   By: Rudie Meyer M.D.   On: 05/30/2019 16:09   VAS [place] LOWER EXTREMITY VENOUS (DVT) (ONLY [facility] & [facility] 7a-7p)  Result Date: 05/30/2019  Lower Venous DVTStudy Indications: Pain, and Erythema.  Comparison Study: no prior Performing Technologist: Jeb Levering RDMS, RVT  Examination Guidelines: A complete evaluation includes B-mode imaging, spectral Doppler, color Doppler, and power Doppler as needed of all accessible portions of each vessel. Bilateral testing is considered an integral part of a complete examination. Limited examinations for reoccurring indications may be performed as noted. The reflux portion of the exam is performed with the patient in reverse Trendelenburg.  +---------+---------------+---------+-----------+----------+--------------+ RIGHT    CompressibilityPhasicitySpontaneityProperties               +---------+---------------+---------+-----------+----------+--------------+ CFV                                                   Not visualized +---------+---------------+---------+-----------+----------+--------------+ FV Prox  Full           Yes      Yes                                 +---------+---------------+---------+-----------+----------+--------------+ FV Mid   Full                                                         +---------+---------------+---------+-----------+----------+--------------+ FV DistalFull                                                        +---------+---------------+---------+-----------+----------+--------------+  PFV      Full                                                        +---------+---------------+---------+-----------+----------+--------------+ POP      Full           Yes      Yes                                 +---------+---------------+---------+-----------+----------+--------------+ PTV      Full                                                        +---------+---------------+---------+-----------+----------+--------------+ PERO     Full                                                        +---------+---------------+---------+-----------+----------+--------------+ Limited due to clothing interference  +----+---------------+---------+-----------+----------+--------------+ LEFTCompressibilityPhasicitySpontaneityProperties               +----+---------------+---------+-----------+----------+--------------+ CFV                                              Not visualized +----+---------------+---------+-----------+----------+--------------+     Summary: RIGHT: - There is no evidence of deep vein thrombosis in the lower extremity. However, portions of this examination were limited- see technologist comments above.  - No cystic structure found in the popliteal fossa. - Interstitial fluid noted in mid calf.   *See table(s) above for measurements and observations. Electronically signed by Sherald Hess MD on 05/30/2019 at 4:05:18 PM.    Final     Assessment:  Mr. Stamour is a 29 yo M w/ a PMHx of HTN, HLD, recently diagnosed rheumatoid arthritis, PTSD, depression, anxiety, bipolar disorder and intermittent explosive disorder who presents with R LE swelling/cramping and right sided chest pain with tachypnea, tachycardia and  signs of infections versus inflammation versus neoplasm of the R sternoclavicular joint on imaging here for continued medical workup and management.    Plan by Problem:  Unilateral R LE Swelling/Cramping: -pt with a two day hx of R leg swelling and cramping unrelieved by tramadol or norco -imaging demonstrates a moderate size effusion of the R knee without changes consistent w/ septic arthritis -imaging also demonstrated a fluid collection in the popliteal fossa which may be due to a ruptured cyst  But is not consistent with myofasciitis or pyomyositis -major venous structures were patient on imaging and R LE dopplers without evidence of DVT  -pt has WBC but this is in setting of recent steroid use -pt does appear acutely sick with tachycardia, tachypnea and lactic of 4.2; pain now so severe pt is limping -on exam R LE is swollen and taught with overlying erthyema; it is very warm to the touch  -CK elevated at 1475 -concern for  infection versus traumatic bakers cyst rupture with complication of compartment syndrome -patient's presentation raises suspicion for a systemic pathology, initially thought to be DVT w/ subsequent PE causing R chest pain as discussed below, but that workup was negative; other systemic concerns include HIV vs disseminated GC/Chlamydia although patient denies sexual activity in years -recent diagnosis of RA with very elevated RF but unimpressive and inconsistent clinical hx causing some suspicion these complaints may be related in some way  Plan: -ortho consulted and will see patient  -MRI of R LE -compartment pressures -continue antibiotics -fu blood cx -dilaudid for pain -HIV -GC/Chlamydia -UDS  Right Sided Chest Pain: -pt with 3 day hx of right sided chest/clavicular pain for which he presented to an urgent care and the ED and has now returned with persistent and progressive pain unresponsive to steroids, pain medication and muscle relaxers  -imaging findings  consistent with inflammation versus infection versus neoplasm at the sternoclavicular joint  -given concomitant finding of R LE swelling, concern for PE but that workup was negative -evaluated by CT surgery who advises continuing antibiotics, fu blood cx, no need to explore shoulder and will need echo at some point -pt has WBC but this is in setting of recent steroid use -pt does appear acutely sick with tachycardia, tachypnea and lactic of 4.2 -patient's presentation raises suspicion for a systemic pathology, initially thought to be PE from DVT causing sx discussed above but that workup was negative; other systemic concerns including HIV vs disseminated GC/Chlamydia although pt denies recent sexual activity -recent diagnosis of RA with very elevated RF but unimpressive and inconsistent clinical hx causing some suspicion these complaints may be related in some way  Plan: -continue antibiotics -fu blood cx -dilaudid for pain -echo at some point -HIV -GC/Chlamydia  -UDS  Hyperglycemia/Glucosuria: -pt with elevated glucose in setting of recent steroid use -also has glucosuria >500 with elevated protein -no hx of diabetes  Plan: -Hgb A1c  Leukocytosis: -pt with elevated WBC in setting of recent steroid use -unclear if this represents infection -pt is tachycardia, tachypnea with a lactic of 4.2  Plan: -trend CBC -fu blood cx -continue antibiotics: vanc, ceftriaxone  HTN: -continue lisinopril 10 mg daily  HLD: -holding atorvastatin given elevated CK  RA: -recently diagnosed -elevated RF but joint pain hx acute with rapid resolution not requiring continued disease modifying therapies   Plan: -dilaudid for pain  PTSD/Intermittent Explosive Disorder: -continue patient's home psych meds: citalopram 40 mg daily, zoloft 50 mg daily, lithium 450 + 300 mg and risperidone 4 mg at bedtime   Principal Problem:   Septic arthritis (HCC) Active Problems:   PTSD (post-traumatic  stress disorder)   Intermittent explosive   Difficulty controlling anger   Compartment syndrome (HCC)   Rheumatoid arthritis (HCC)   Glucosuria  Dispo: Admit patient to Inpatient with expected length of stay greater than 2 midnights.  Signed: Jenell Milliner, MD 05/30/2019, 9:27 PM  Pager: [phone]

## 2019-05-31 ENCOUNTER — Inpatient Hospital Stay (HOSPITAL_COMMUNITY): Payer: BC Managed Care – PPO | Admitting: Certified Registered Nurse Anesthetist

## 2019-05-31 ENCOUNTER — Encounter (HOSPITAL_COMMUNITY)
Admission: EM | Disposition: A | Discharge status: Home / Self Care | Payer: Self-pay | Source: Home / Self Care | Attending: Internal Medicine

## 2019-05-31 DIAGNOSIS — M069 Rheumatoid arthritis, unspecified: Secondary | ICD-10-CM

## 2019-05-31 DIAGNOSIS — R7303 Prediabetes: Secondary | ICD-10-CM | POA: Diagnosis present

## 2019-05-31 DIAGNOSIS — M00061 Staphylococcal arthritis, right knee: Secondary | ICD-10-CM

## 2019-05-31 DIAGNOSIS — M869 Osteomyelitis, unspecified: Secondary | ICD-10-CM | POA: Diagnosis not present

## 2019-05-31 DIAGNOSIS — B9561 Methicillin susceptible Staphylococcus aureus infection as the cause of diseases classified elsewhere: Secondary | ICD-10-CM | POA: Diagnosis present

## 2019-05-31 DIAGNOSIS — L0291 Cutaneous abscess, unspecified: Secondary | ICD-10-CM

## 2019-05-31 DIAGNOSIS — R7881 Bacteremia: Secondary | ICD-10-CM

## 2019-05-31 DIAGNOSIS — F17210 Nicotine dependence, cigarettes, uncomplicated: Secondary | ICD-10-CM

## 2019-05-31 DIAGNOSIS — M009 Pyogenic arthritis, unspecified: Secondary | ICD-10-CM | POA: Diagnosis not present

## 2019-05-31 DIAGNOSIS — M609 Myositis, unspecified: Secondary | ICD-10-CM | POA: Diagnosis present

## 2019-05-31 HISTORY — PX: I & D EXTREMITY: SHX5045

## 2019-05-31 LAB — BLOOD CULTURE ID PANEL (REFLEXED)

## 2019-05-31 LAB — RAPID URINE DRUG SCREEN, HOSP PERFORMED
Amphetamines: NOT DETECTED
Barbiturates: NOT DETECTED
Benzodiazepines: NOT DETECTED
Cocaine: NOT DETECTED
Opiates: POSITIVE — AB
Tetrahydrocannabinol: NOT DETECTED

## 2019-05-31 LAB — CBC
HCT: 36.8 % — ABNORMAL LOW (ref 39.0–52.0)
Hemoglobin: 11.7 g/dL — ABNORMAL LOW (ref 13.0–17.0)
MCH: 24.9 pg — ABNORMAL LOW (ref 26.0–34.0)
MCHC: 31.8 g/dL (ref 30.0–36.0)
MCV: 78.3 fL — ABNORMAL LOW (ref 80.0–100.0)
Platelets: 354 10*3/uL (ref 150–400)
RBC: 4.7 MIL/uL (ref 4.22–5.81)
RDW: 16.8 % — ABNORMAL HIGH (ref 11.5–15.5)
WBC: 17.2 10*3/uL — ABNORMAL HIGH (ref 4.0–10.5)
nRBC: 0 % (ref 0.0–0.2)

## 2019-05-31 LAB — GLUCOSE, CAPILLARY
Glucose-Capillary: 191 mg/dL — ABNORMAL HIGH (ref 70–99)
Glucose-Capillary: 211 mg/dL — ABNORMAL HIGH (ref 70–99)
Glucose-Capillary: 184 mg/dL — ABNORMAL HIGH (ref 70–99)

## 2019-05-31 LAB — HIV ANTIBODY (ROUTINE TESTING W REFLEX): HIV Screen 4th Generation wRfx: NONREACTIVE

## 2019-05-31 LAB — URINE CULTURE: Culture: NO GROWTH

## 2019-05-31 LAB — HEMOGLOBIN A1C
Hgb A1c MFr Bld: 6.2 % — ABNORMAL HIGH (ref 4.8–5.6)
Mean Plasma Glucose: 131.24 mg/dL

## 2019-05-31 LAB — SYNOVIAL CELL COUNT + DIFF, W/ CRYSTALS
Crystals, Fluid: NONE SEEN
Monocyte-Macrophage-Synovial Fluid: 12 % — ABNORMAL LOW (ref 50–90)
Neutrophil, Synovial: 88 % — ABNORMAL HIGH (ref 0–25)
WBC, Synovial: 66000 /mm3 — ABNORMAL HIGH (ref 0–200)

## 2019-05-31 LAB — COMPREHENSIVE METABOLIC PANEL
ALT: 22 U/L (ref 0–44)
AST: 29 U/L (ref 15–41)
Albumin: 2.3 g/dL — ABNORMAL LOW (ref 3.5–5.0)
Alkaline Phosphatase: 85 U/L (ref 38–126)
Anion gap: 11 (ref 5–15)
BUN: 8 mg/dL (ref 6–20)
CO2: 21 mmol/L — ABNORMAL LOW (ref 22–32)
Calcium: 8.6 mg/dL — ABNORMAL LOW (ref 8.9–10.3)
Chloride: 100 mmol/L (ref 98–111)
Creatinine, Ser: 0.79 mg/dL (ref 0.61–1.24)
GFR calc Af Amer: >60 mL/min (ref >60)
GFR calc non Af Amer: >60 mL/min (ref >60)
Glucose, Bld: 194 mg/dL — ABNORMAL HIGH (ref 70–99)
Potassium: 4.3 mmol/L (ref 3.5–5.1)
Sodium: 132 mmol/L — ABNORMAL LOW (ref 135–145)
Total Bilirubin: 1.2 mg/dL (ref 0.3–1.2)
Total Protein: 6.2 g/dL — ABNORMAL LOW (ref 6.5–8.1)

## 2019-05-31 LAB — CK: Total CK: 1064 U/L — ABNORMAL HIGH (ref 49–397)

## 2019-05-31 LAB — SURGICAL PCR SCREEN
MRSA, PCR: NEGATIVE
Staphylococcus aureus: POSITIVE — AB

## 2019-05-31 LAB — OSMOLALITY: Osmolality: 279 mOsm/kg (ref 275–295)

## 2019-05-31 IMAGING — MR MR STERNUM WO/W CM
14 series · 16 of 16 positions shown · IV contrast (Gadavist)
Comparison: [DATE]

CLINICAL DATA: Inflammatory changes right sternoclavicular joint on
prior CT chest, concern for septic arthritis

EXAM:
MRI sternum with bowel and with CONTRAST
TECHNIQUE: Multiplanar, multiecho pulse sequences of the sternoclavicular
joints before and after the intravenous administration of
gadolinium.
CONTRAST:  10mL GADAVIST GADOBUTROL 1 MMOL/ML IV SOLN

[Series 4: t1_tse_tra_320 · axial · 3.0mm · 0.47mm/px · 1 of 40 slices shown]
[im 1/40]
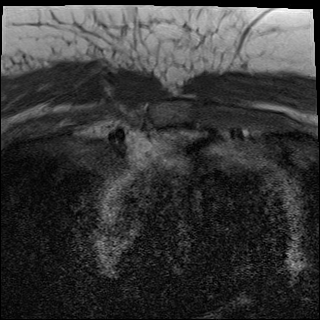

[Series 5: t2_tse_fs_tra_352 · axial · 3.0mm · 0.43mm/px · 1 of 40 slices shown (1 of 2)]
[im 1/40]
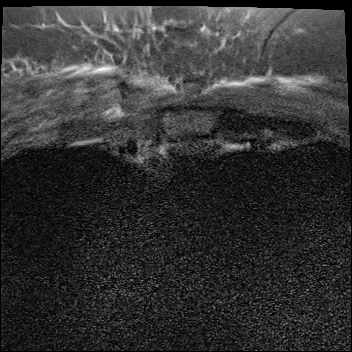

[Series 6: t1_tse_cor_304 · coronal · 4.0mm · 0.53mm/px · 1 of 24 slices shown]
[im 1/24]
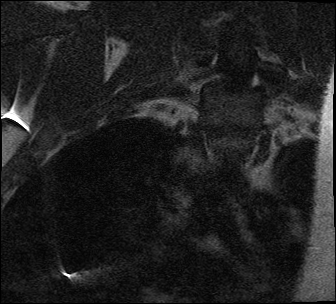

[Series 7: t2_tse_fs_cor_304 · coronal · 4.0mm · 0.49mm/px · 1 of 24 slices shown]
[im 1/24]
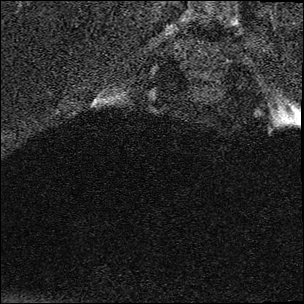

[Series 8: t2_tse_stir_sag_288 · sagittal · 4.0mm · 0.49mm/px · 1 of 32 slices shown]
[im 1/32]
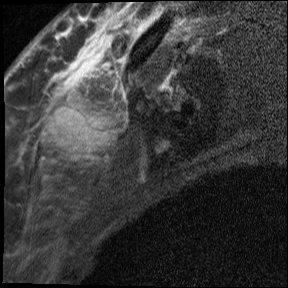

[Series 9: t1_tse_fs_tra_320 · axial · 4.0mm · 0.47mm/px · 1 of 32 slices shown (1 of 2)]
[im 1/32]
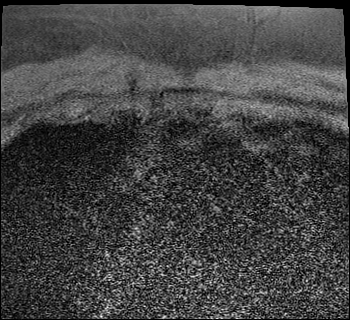

[Series 10: t2_tse_fs_tra_352 · axial · 3.0mm · 0.40mm/px · 1 of 40 slices shown (2 of 2)]
[im 1/40]
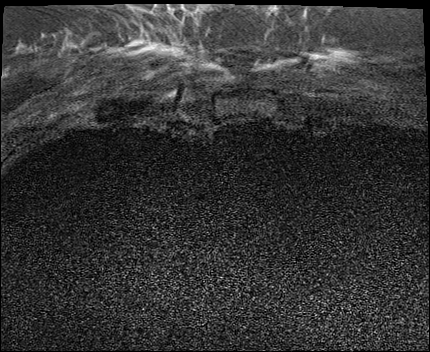

[Series 11: cor haste fs · coronal · 4.0mm · 0.56mm/px · 1 of 24 slices shown]
[im 1/24]
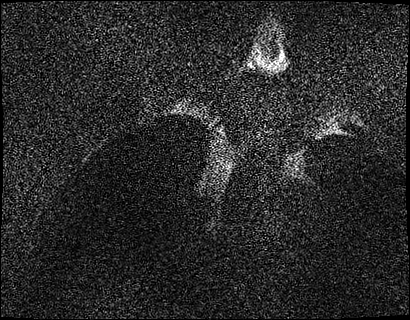

[Series 12: ax haste fs · axial · 4.5mm · 0.69mm/px · 1 of 24 slices shown]
[im 1/24]
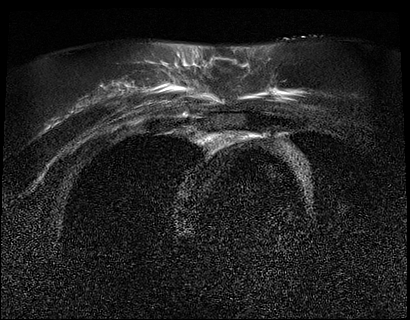

[Series 13: t1_tse_fs_tra_320 · axial · 4.0mm · 0.47mm/px · 1 of 32 slices shown (2 of 2)]
[im 1/32]
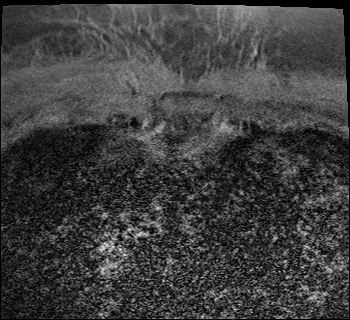

[Series 14: t1_tse_fs_cor_304 · coronal · 4.0mm · 0.53mm/px · 1 of 24 slices shown (1 of 2)]
[im 1/24]
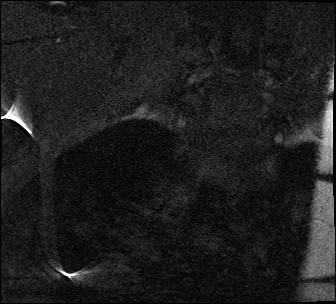

[Series 15: t1_vibe_opp-in_tra_p4_bh · axial · 3.0mm · 1.19mm/px · z∈[-105,+82]mm · 2 of 64 slices shown]
[im 1/64]
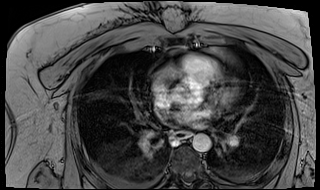
[im 64/64]
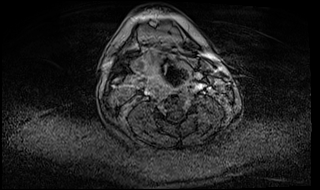

[Series 16: t1_tse_fs_cor_304 · sagittal · 4.0mm · 0.53mm/px · 1 of 36 slices shown (2 of 2)]
[im 1/36]
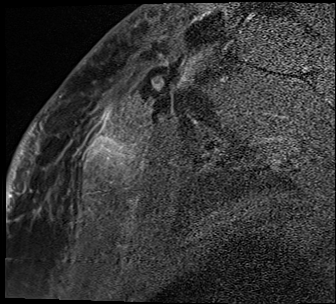

[Series 17: T1 dynamic post-contrast · coronal · 3.0mm · 0.94mm/px · 2 of 64 slices shown]
[im 1/64]
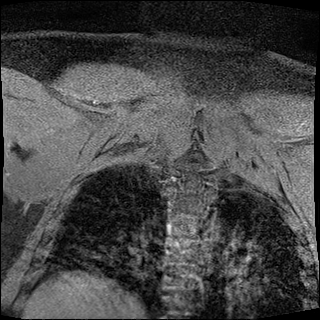
[im 64/64]
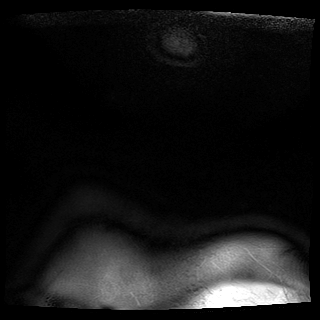

[16 of 16 positions shown; findings below may reference images not displayed]

FINDINGS: Bones

Evaluation is limited by patient motion throughout the study. There
is abnormal edema within the bilateral clavicular heads, left
greater than right. Edema is also seen within the sternal manubrium
abutting the sternoclavicular joints, left greater than right.

The areas of marrow edema demonstrate enhancement, left more
pronounced than right.

Joints

Hypertrophic changes are seen at the bilateral sternoclavicular
joints, right greater than left. There is a small amount of fluid
within the right sternoclavicular joint.

Other findings

There is extensive soft tissue edema surrounding the manubrium and
right sternoclavicular joint. The edema extends into the anterior
mediastinum to the level of the manubriosternal junction. Edema also
involves the right supraclavicular region and inferior aspect of the
right sternocleidomastoid muscle.

There are rim enhancing fluid collections surrounding the right
sternoclavicular joint, extending into the right supraclavicular
region and lateral margin of the right sternocleidomastoid muscle,
compatible with abscesses. The abscess in the right supraclavicular
region/sternocleidomastoid muscle measures 2.7 x 2.1 cm in
transverse dimension, and extends approximately 6.3 cm in
craniocaudal length. There is a smaller abscess just above the
sternal notch measuring 1.2 x 1.3 cm.

There is extensive edema in the subcutaneous tissues right
supraclavicular region and base of neck. Multiple small reactive
lymph nodes are seen in the right supraclavicular region.
IMPRESSION: 1. Findings consistent with septic arthritis and osteomyelitis of
the sternoclavicular joints.
2. Multiple abscesses surrounding the right sternoclavicular joint
and extending into the right supraclavicular tissues and right
sternocleidomastoid muscle as above.

## 2019-05-31 SURGERY — IRRIGATION AND DEBRIDEMENT EXTREMITY
Anesthesia: General | Site: Knee | Laterality: Right

## 2019-05-31 MED ORDER — OXYCODONE HCL 5 MG PO TABS: 5.0000 mg | ORAL_TABLET | Freq: Once | ORAL | Status: DC | PRN

## 2019-05-31 MED ORDER — PROPOFOL 10 MG/ML IV BOLUS
INTRAVENOUS | Status: AC
  Filled 2019-05-31: qty 20

## 2019-05-31 MED ORDER — KETOROLAC TROMETHAMINE 30 MG/ML IJ SOLN
30.0000 mg | Freq: Once | INTRAMUSCULAR | Status: AC
  Administered 2019-05-31: 30 mg via INTRAVENOUS

## 2019-05-31 MED ORDER — GADOBUTROL 1 MMOL/ML IV SOLN
10.0000 mL | Freq: Once | INTRAVENOUS | Status: AC | PRN
  Administered 2019-05-31: 10 mL via INTRAVENOUS

## 2019-05-31 MED ORDER — SUCCINYLCHOLINE CHLORIDE 200 MG/10ML IV SOSY
PREFILLED_SYRINGE | INTRAVENOUS | Status: AC
  Filled 2019-05-31: qty 10

## 2019-05-31 MED ORDER — FENTANYL CITRATE (PF) 250 MCG/5ML IJ SOLN
INTRAMUSCULAR | Status: AC
  Filled 2019-05-31: qty 5

## 2019-05-31 MED ORDER — SUGAMMADEX SODIUM 200 MG/2ML IV SOLN
INTRAVENOUS | Status: DC | PRN
  Administered 2019-05-31: 250 mg via INTRAVENOUS
  Administered 2019-05-31 (×2): 25 mg via INTRAVENOUS

## 2019-05-31 MED ORDER — HYDROMORPHONE HCL 1 MG/ML IJ SOLN
1.0000 mg | INTRAMUSCULAR | Status: DC | PRN
  Administered 2019-05-31 – 2019-06-02 (×9): 1 mg via INTRAVENOUS
  Filled 2019-05-31 (×9): qty 1

## 2019-05-31 MED ORDER — ONDANSETRON HCL 4 MG/2ML IJ SOLN
INTRAMUSCULAR | Status: AC
  Filled 2019-05-31: qty 2

## 2019-05-31 MED ORDER — PROMETHAZINE HCL 25 MG/ML IJ SOLN: 6.2500 mg | INTRAMUSCULAR | Status: DC | PRN

## 2019-05-31 MED ORDER — KETOROLAC TROMETHAMINE 30 MG/ML IJ SOLN
INTRAMUSCULAR | Status: AC
  Filled 2019-05-31: qty 1

## 2019-05-31 MED ORDER — DEXAMETHASONE SODIUM PHOSPHATE 10 MG/ML IJ SOLN
INTRAMUSCULAR | Status: AC
  Filled 2019-05-31: qty 1

## 2019-05-31 MED ORDER — 0.9 % SODIUM CHLORIDE (POUR BTL) OPTIME
TOPICAL | Status: DC | PRN
  Administered 2019-05-31: 11:00:00 1000 mL

## 2019-05-31 MED ORDER — DEXAMETHASONE SODIUM PHOSPHATE 4 MG/ML IJ SOLN
INTRAMUSCULAR | Status: DC | PRN
  Administered 2019-05-31: 4 mg via INTRAVENOUS

## 2019-05-31 MED ORDER — LIDOCAINE HCL (CARDIAC) PF 100 MG/5ML IV SOSY
PREFILLED_SYRINGE | INTRAVENOUS | Status: DC | PRN
  Administered 2019-05-31: 60 mg via INTRAVENOUS

## 2019-05-31 MED ORDER — CEFAZOLIN SODIUM-DEXTROSE 2-4 GM/100ML-% IV SOLN
2.0000 g | Freq: Three times a day (TID) | INTRAVENOUS | Status: DC
  Administered 2019-05-31 – 2019-06-06 (×19): 2 g via INTRAVENOUS
  Filled 2019-05-31 (×21): qty 100

## 2019-05-31 MED ORDER — SUCCINYLCHOLINE CHLORIDE 20 MG/ML IJ SOLN
INTRAMUSCULAR | Status: DC | PRN
  Administered 2019-05-31: 150 mg via INTRAVENOUS
  Administered 2019-05-31: 50 mg via INTRAVENOUS

## 2019-05-31 MED ORDER — IPRATROPIUM-ALBUTEROL 20-100 MCG/ACT IN AERS
INHALATION_SPRAY | RESPIRATORY_TRACT | Status: DC | PRN
  Administered 2019-05-31 (×2): 2 via RESPIRATORY_TRACT

## 2019-05-31 MED ORDER — OXYCODONE HCL 5 MG/5ML PO SOLN: 5.0000 mg | Freq: Once | ORAL | Status: DC | PRN

## 2019-05-31 MED ORDER — PROPOFOL 10 MG/ML IV BOLUS
INTRAVENOUS | Status: DC | PRN
  Administered 2019-05-31: 200 mg via INTRAVENOUS

## 2019-05-31 MED ORDER — MIDAZOLAM HCL 2 MG/2ML IJ SOLN
INTRAMUSCULAR | Status: AC
  Filled 2019-05-31: qty 2

## 2019-05-31 MED ORDER — INSULIN ASPART 100 UNIT/ML ~~LOC~~ SOLN: 0.0000 [IU] | Freq: Every day | SUBCUTANEOUS | Status: DC

## 2019-05-31 MED ORDER — ACETAMINOPHEN 10 MG/ML IV SOLN: 1000.0000 mg | Freq: Once | INTRAVENOUS | Status: DC | PRN

## 2019-05-31 MED ORDER — INSULIN ASPART 100 UNIT/ML ~~LOC~~ SOLN
0.0000 [IU] | Freq: Three times a day (TID) | SUBCUTANEOUS | Status: DC
  Administered 2019-05-31: 3 [IU] via SUBCUTANEOUS
  Administered 2019-06-01 (×2): 2 [IU] via SUBCUTANEOUS
  Administered 2019-06-05 (×2): 1 [IU] via SUBCUTANEOUS
  Administered 2019-06-06: 2 [IU] via SUBCUTANEOUS
  Administered 2019-06-06: 1 [IU] via SUBCUTANEOUS

## 2019-05-31 MED ORDER — LIDOCAINE 2% (20 MG/ML) 5 ML SYRINGE
INTRAMUSCULAR | Status: AC
  Filled 2019-05-31: qty 5

## 2019-05-31 MED ORDER — SODIUM CHLORIDE 0.9 % IR SOLN
Status: DC | PRN
  Administered 2019-05-31 (×2): 3000 mL

## 2019-05-31 MED ORDER — LIDOCAINE HCL (PF) 1 % IJ SOLN
INTRAMUSCULAR | Status: AC
  Filled 2019-05-31: qty 30

## 2019-05-31 MED ORDER — DEXTROSE 5 % IV SOLN
INTRAVENOUS | Status: DC | PRN
  Administered 2019-05-31: 11:00:00 2 g via INTRAVENOUS

## 2019-05-31 MED ORDER — HYDROMORPHONE HCL 1 MG/ML IJ SOLN: 0.2500 mg | INTRAMUSCULAR | Status: DC | PRN

## 2019-05-31 MED ORDER — FENTANYL CITRATE (PF) 100 MCG/2ML IJ SOLN
INTRAMUSCULAR | Status: DC | PRN
  Administered 2019-05-31: 50 ug via INTRAVENOUS

## 2019-05-31 MED ORDER — PHENYLEPHRINE 40 MCG/ML (10ML) SYRINGE FOR IV PUSH (FOR BLOOD PRESSURE SUPPORT)
PREFILLED_SYRINGE | INTRAVENOUS | Status: AC
  Filled 2019-05-31: qty 10

## 2019-05-31 MED ORDER — SODIUM CHLORIDE 0.9 % IV SOLN: INTRAVENOUS | Status: DC

## 2019-05-31 MED ORDER — ONDANSETRON HCL 4 MG/2ML IJ SOLN
INTRAMUSCULAR | Status: DC | PRN
  Administered 2019-05-31: 4 mg via INTRAVENOUS

## 2019-05-31 SURGICAL SUPPLY — 41 items
BNDG COHESIVE 4X5 TAN STRL (GAUZE/BANDAGES/DRESSINGS) ×2 IMPLANT
BNDG ELASTIC 4X5.8 VLCR STR LF (GAUZE/BANDAGES/DRESSINGS) ×2 IMPLANT
BNDG ELASTIC 6X5.8 VLCR STR LF (GAUZE/BANDAGES/DRESSINGS) ×2 IMPLANT
COVER SURGICAL LIGHT HANDLE (MISCELLANEOUS) ×3 IMPLANT
CUFF TOURN SGL QUICK 34 (TOURNIQUET CUFF) ×3
CUFF TRNQT CYL 34X4.125X (TOURNIQUET CUFF) IMPLANT
DRAPE U-SHAPE 47X51 STRL (DRAPES) ×3 IMPLANT
DURAPREP 26ML APPLICATOR (WOUND CARE) ×5 IMPLANT
ELECT REM PT RETURN 9FT ADLT (ELECTROSURGICAL) ×3
ELECTRODE REM PT RTRN 9FT ADLT (ELECTROSURGICAL) IMPLANT
EVACUATOR 1/8 PVC DRAIN (DRAIN) ×2 IMPLANT
GAUZE SPONGE 4X4 12PLY STRL (GAUZE/BANDAGES/DRESSINGS) ×2 IMPLANT
GAUZE XEROFORM 5X9 LF (GAUZE/BANDAGES/DRESSINGS) ×2 IMPLANT
GLOVE BIOGEL PI IND STRL 7.0 (GLOVE) ×1 IMPLANT
GLOVE BIOGEL PI INDICATOR 7.0 (GLOVE) ×2
GLOVE ECLIPSE 7.0 STRL STRAW (GLOVE) ×3 IMPLANT
GLOVE SKINSENSE NS SZ7.5 (GLOVE) ×4
GLOVE SKINSENSE STRL SZ7.5 (GLOVE) ×2 IMPLANT
GOWN STRL REIN XL XLG (GOWN DISPOSABLE) ×4 IMPLANT
KIT BASIN OR (CUSTOM PROCEDURE TRAY) ×3 IMPLANT
KIT TURNOVER KIT B (KITS) ×3 IMPLANT
MANIFOLD NEPTUNE II (INSTRUMENTS) ×3 IMPLANT
PACK ORTHO EXTREMITY (CUSTOM PROCEDURE TRAY) ×3 IMPLANT
PAD ABD 8X10 STRL (GAUZE/BANDAGES/DRESSINGS) ×2 IMPLANT
PAD ARMBOARD 7.5X6 YLW CONV (MISCELLANEOUS) ×6 IMPLANT
PAD CAST 4YDX4 CTTN HI CHSV (CAST SUPPLIES) IMPLANT
PADDING CAST COTTON 4X4 STRL (CAST SUPPLIES) ×3
PADDING CAST COTTON 6X4 STRL (CAST SUPPLIES) ×2 IMPLANT
SET CYSTO W/LG BORE CLAMP LF (SET/KITS/TRAYS/PACK) ×2 IMPLANT
SPONGE LAP 18X18 RF (DISPOSABLE) ×3 IMPLANT
STOCKINETTE IMPERVIOUS 9X36 MD (GAUZE/BANDAGES/DRESSINGS) ×3 IMPLANT
SUT ETHILON 2 0 FS 18 (SUTURE) ×5 IMPLANT
SUT MON AB 2-0 CT1 36 (SUTURE) ×2 IMPLANT
SUT PDS AB 0 CT1 27 (SUTURE) ×2 IMPLANT
TOWEL GREEN STERILE (TOWEL DISPOSABLE) ×3 IMPLANT
TOWEL GREEN STERILE FF (TOWEL DISPOSABLE) ×3 IMPLANT
TUBE CONNECTING 12'X1/4 (SUCTIONS) ×1
TUBE CONNECTING 12X1/4 (SUCTIONS) ×2 IMPLANT
UNDERPAD 30X30 (UNDERPADS AND DIAPERS) ×4 IMPLANT
WATER STERILE IRR 1000ML POUR (IV SOLUTION) ×3 IMPLANT
YANKAUER SUCT BULB TIP NO VENT (SUCTIONS) ×3 IMPLANT

## 2019-05-31 NOTE — Significant Event (Signed)
Rapid Response Event Note  Overview: MEWS 4 - Increased HR and RR   RN called to report MEWS 4 - per nurse - patient is not acute distress, ongoing shallow breathing, and is diaphoretic. I asked the nurse recheck a temperature and check a blood sugar. Nurse had already notified the primary service.   On rounds, I checked on Herbert Walsh at 0830 - he was sleeping comfortably. I woke him and asked him how he was feeling. He endorsed having generalized pain everywhere - stated that it has not gotten better. I saw the RLE - swollen/red/tight but palpable pulse present. Patient is quite diaphoretic but he states that he normal sweats a lot. Not in acute distress but does look sick overall.   VS - 139/74(92), HR 117, RR 22-26 - shallow, 95% on RA, 98.2 (O) temperature.   Plan: -- Monitor VS per MEWS guidelines -- Treat pain as patient can tolerate -- Rest per medical team  Ashley Montminy R

## 2019-05-31 NOTE — Progress Notes (Signed)
   Subjective:  Herbert Walsh is a 29 y.o. with PMHx of  rheumatoid arthritis(recent diagnosis), hypertension, hyperlipidemia, PTSD, bipolar disorder and intermittent explosive disorder admitted for active arthritis of right sternoclavicular joint and septic arthritis of the right knee.  On hospital day 1  Patient continues to be in a lot of pain this morning.Has numbness in right foot. All questions and concerns addressed..   Objective:  Vital signs in last 24 hours: Vitals:   05/30/19 2045 05/30/19 2100 05/30/19 2131 05/31/19 0352  BP:   (!) 155/84 (!) 167/90  Pulse: (!) 126 (!) 127 (!) 129 (!) 120  Resp: (!) 25 (!) 34 (!) 25 20  Temp:   98.7 F (37.1 C) 99.5 F (37.5 C)  TempSrc:   Oral Oral  SpO2: 94% 94% 95% 97%  Weight:      Height:         General: Appears distressed, well-nourished HE: Normocephalic, atraumatic , EOMI, Conjunctivae normal ENT: No congestion, no rhinorrhea, no exudate or erythema  Cardiovascular: Normal rate, regular rhythm.  No murmurs, rubs, or gallops Pulmonary : Effort normal, breath sounds normal. No wheezes, rales, or rhonchi Abdominal: soft, nontender,  bowel sounds present Ext:  Erythema and warmth over right sternoclavicular joint.  Erythema and warmth in the lower extremity.  Right lower extremity swollen, skin taught  Psychiatric/Behavioral:  normal mood, normal behavior, cooperative   Assessment/Plan:  Principal Problem:   Septic arthritis (HCC) Active Problems:   PTSD (post-traumatic stress disorder)   Intermittent explosive   Difficulty controlling anger   Compartment syndrome (HCC)   Rheumatoid arthritis (HCC)   Glucosuria  Herbert Walsh is a 29 year old male with past medical history rheumatoid arthritis(recent diagnosis), hypertension, hyperlipidemia, PTSD, bipolar disorder and intermittent explosive disorder here with septic arthritis of right sternoclavicular joint and right knee ,  found to have methicillin sensitive staph aureus  bacteremia.  MSSA bacteremia Septic arthritis Patient involvement of right sternal clavicular joint and right knee.  Unknown etiology, patient any injectable drugs and denies any recent wounds.  He is not on immune suppression.  Poor dentition but no recent cleanings or infections.  Right lower leg is very firm on exam , foot is warm, palpable posterior tibial pulse, and do not believe he has compartment syndrome.    Plan: -CT surgery consult,  appreciate recommendations -Ortho consulted, appreciate recommendations -ID also consulted , appreciate their recommendations -TTE carditis -Continue cefazolin -[place] per Ortho -As needed 1 mg Dilaudid every 4 hours severe pain -Follow-up knee culture  Hyperglycemia, glucosuria: Hemoglobin A1c 6.2, prediabetic range Plan: SSI-S  PTSD/intermediate explosive disorder/depression -Continue with patient's home psych meds: Citalopram 40 mg daily Zoloft 50 mg daily, lithium 750 mg daily , risperidone 4 mg at bedtime  Hypertension: -Lisinopril 10 mg daily  HLD: Continue holding atorvastatin given elevated CK   DVT prophx: Lovenox Diet: NPO Bowel: MiraLAX as needed Code: Full  Dispo: Anticipated discharge in approximately 3-4 day(s).   Thurmon Fair, MD PGY1

## 2019-05-31 NOTE — Progress Notes (Signed)
PHARMACY - PHYSICIAN COMMUNICATION CRITICAL VALUE ALERT - BLOOD CULTURE IDENTIFICATION (BCID)  Herbert Walsh is an 29 y.o. male who presented to [facility] on 05/30/2019 with a chief complaint of leg swelling, likely infection of the right sternoclavicular joint.   Assessment:  2/4 bottles positive for Staph aureus   Name of physician (or Provider) Contacted: Albertha Ghee MD  Current antibiotics: ceftriaxone, vancomycin   Changes to prescribed antibiotics recommended:  Recommendations accepted by provider  Stop vancomycin and ceftriaxone  Start cefazolin   No results found for this or any previous visit.  [name] [facility] 05/31/2019  11:04 AM

## 2019-05-31 NOTE — Consult Note (Signed)
[facility]       Reason for Consult: MSSA bacteremia    Referring Physician: CHAMP autoconsult  Principal Problem:   MSSA bacteremia Active Problems:   Septic arthritis of right sternoclavicular joint (HCC)   Rheumatoid arthritis (HCC)   Septic arthritis of knee, right (HCC)   Myofascitis   Prediabetes   . citalopram  40 mg Oral Daily  . enoxaparin (LOVENOX) injection  40 mg Subcutaneous Q24H  . lisinopril  10 mg Oral Daily  . lithium carbonate  750 mg Oral Daily  . risperidone  4 mg Oral QHS  . sertraline  50 mg Oral Daily    Recommendations: Continue with cefazolin TTE  repeat blood cultures  Of note, the mother at the bedside would like to be informed of any procedures or interventions as the patient has underlying developmental issues.    Assessment: He has a disseminated MSSA infection with septic arthritis of the knee,  Abscess, osteomyelitis and septic arthritis of the sternoclavicular joint and bacteremia.    Antibiotics: Cefazolin Previous vancomycin, clindamycin, ceftriaxone  HPI: Herbert Walsh is a 29 y.o. male with history of RA and had come in with right clavicular pain and found the above bacteremia and septic arthritis.  He has been debrided by Dr. Roda Walsh this am and no surgery indicated of the sternoclavicular joint per Dr. Donata Walsh.  He has no complaints now and has been afebrile.  WBC 17.2.  Tolerating antibiotics with no rash or diarrhea.  Denies history of IVDU or any drug use.    Review of Systems:  Constitutional: negative for fevers and chills Gastrointestinal: negative for nausea and diarrhea Integument/breast: negative for rash All other systems reviewed and are negative    Past Medical History:  Diagnosis Date  . Arthritis   . Hypertension 2019    Social History   Tobacco Use  . Smoking status: Current Every Day Smoker    Packs/day: 0.50    Years: 11.00    Pack years: 5.50    Types: Cigarettes  .  Smokeless tobacco: Never Used  Substance Use Topics  . Alcohol use: No    Comment: one beer every 2-3 months  . Drug use: No    Family History  Problem Relation Age of Onset  . Alcohol abuse Mother   . Depression Mother   . Drug abuse Mother   . Physical abuse Mother   . Sexual abuse Mother   . ADD / ADHD Paternal Aunt   . Alcohol abuse Maternal Grandfather   . Bipolar disorder Maternal Grandmother   . Schizophrenia Maternal Grandmother   . Drug abuse Maternal Grandmother   . Alcohol abuse Paternal Grandfather   . Depression Paternal Grandfather   . Depression Paternal Grandmother   . Drug abuse Paternal Grandmother     No Known Allergies  Physical Exam: Constitutional: in no apparent distress  Vitals:   05/31/19 1215 05/31/19 1230  BP: 140/63 (!) 143/79  Pulse: (!) 135 (!) 122  Resp: 11 (!) 22  Temp: 97.9 F (36.6 C) 98.2 F (36.8 C)  SpO2: 94% 96%   EYES: anicteric Cardiovascular: Cor RRR Respiratory: clear; Musculoskeletal: no pedal edema noted Skin: negatives: no rash Neuro: non focal MS: leg wrapped  Lab Results  Component Value Date   WBC 17.2 (H) 05/31/2019   HGB 11.7 (L) 05/31/2019   HCT 36.8 (L) 05/31/2019   MCV 78.3 (L) 05/31/2019   PLT 354 05/31/2019  Lab Results  Component Value Date   CREATININE 0.79 05/31/2019   BUN 8 05/31/2019   NA 132 (L) 05/31/2019   K 4.3 05/31/2019   CL 100 05/31/2019   CO2 21 (L) 05/31/2019    Lab Results  Component Value Date   ALT 22 05/31/2019   AST 29 05/31/2019   ALKPHOS 85 05/31/2019     Microbiology: Recent Results (from the past 240 hour(s))  SARS CORONAVIRUS 2 (TAT 6-24 HRS) Nasopharyngeal Nasopharyngeal Swab     Status: None   Collection Time: 05/30/19  3:14 PM   Specimen: Nasopharyngeal Swab  Result Value Ref Range Status   SARS Coronavirus 2 NEGATIVE NEGATIVE Final    Comment: (NOTE) SARS-CoV-2 target nucleic acids are NOT DETECTED. The SARS-CoV-2 RNA is generally detectable in upper  and lower respiratory specimens during the acute phase of infection. Negative results do not preclude SARS-CoV-2 infection, do not rule out co-infections with other pathogens, and should not be used as the sole basis for treatment or other patient management decisions. Negative results must be combined with clinical observations, patient history, and epidemiological information. The expected result is Negative. Fact Sheet for Patients: SugarRoll.be Fact Sheet for Healthcare Providers: https://www.woods-mathews.com/ This test is not yet approved or cleared by the [place] FDA and  has been authorized for detection and/or diagnosis of SARS-CoV-2 by FDA under an Emergency Use Authorization (EUA). This EUA will remain  in effect (meaning this test can be used) for the duration of the COVID-19 declaration under Section 56 4(b)(1) of the Act, 21 U.S.C. section 360bbb-3(b)(1), unless the authorization is terminated or revoked sooner. Performed at [facility], [place] [address] [place], [place] [address]   Blood Culture (routine x 2)     Status: Abnormal (Preliminary result)   Collection Time: 05/30/19  4:35 PM   Specimen: BLOOD  Result Value Ref Range Status   Specimen Description BLOOD SITE NOT SPECIFIED  Final   Special Requests   Final    BOTTLES DRAWN AEROBIC AND ANAEROBIC Blood Culture results may not be optimal due to an inadequate volume of blood received in culture bottles   Culture  Setup Time   Final    GRAM POSITIVE COCCI IN CLUSTERS IN BOTH AEROBIC AND ANAEROBIC BOTTLES Organism ID to follow CRITICAL RESULT CALLED TO, READ BACK BY AND VERIFIED WITH: [place] [id number] MLM Performed at [facility], [place] [address] [place], [place] [address]    Culture STAPHYLOCOCCUS AUREUS (A)  Final   Report Status PENDING  Incomplete  Blood Culture ID Panel (Reflexed)     Status: Abnormal   Collection Time: 05/30/19   4:35 PM  Result Value Ref Range Status   Enterococcus species NOT DETECTED NOT DETECTED Final   Listeria monocytogenes NOT DETECTED NOT DETECTED Final   Staphylococcus species DETECTED (A) NOT DETECTED Final    Comment: CRITICAL RESULT CALLED TO, READ BACK BY AND VERIFIED WITH: PHARMD T BAUMEISTER [id number] MLM    Staphylococcus aureus (BCID) DETECTED (A) NOT DETECTED Final    Comment: Methicillin (oxacillin) susceptible Staphylococcus aureus (MSSA). Preferred therapy is anti staphylococcal beta lactam antibiotic (Cefazolin or Nafcillin), unless clinically contraindicated. CRITICAL RESULT CALLED TO, READ BACK BY AND VERIFIED WITH: PHARMD T BAUMEISTER [id number] MLM    Methicillin resistance NOT DETECTED NOT DETECTED Final   Streptococcus species NOT DETECTED NOT DETECTED Final   Streptococcus agalactiae NOT DETECTED NOT DETECTED Final   Streptococcus pneumoniae NOT DETECTED NOT  DETECTED Final   Streptococcus pyogenes NOT DETECTED NOT DETECTED Final   Acinetobacter baumannii NOT DETECTED NOT DETECTED Final   Enterobacteriaceae species NOT DETECTED NOT DETECTED Final   Enterobacter cloacae complex NOT DETECTED NOT DETECTED Final   Escherichia coli NOT DETECTED NOT DETECTED Final   Klebsiella oxytoca NOT DETECTED NOT DETECTED Final   Klebsiella pneumoniae NOT DETECTED NOT DETECTED Final   Proteus species NOT DETECTED NOT DETECTED Final   Serratia marcescens NOT DETECTED NOT DETECTED Final   Haemophilus influenzae NOT DETECTED NOT DETECTED Final   Neisseria meningitidis NOT DETECTED NOT DETECTED Final   Pseudomonas aeruginosa NOT DETECTED NOT DETECTED Final   Candida albicans NOT DETECTED NOT DETECTED Final   Candida glabrata NOT DETECTED NOT DETECTED Final   Candida krusei NOT DETECTED NOT DETECTED Final   Candida parapsilosis NOT DETECTED NOT DETECTED Final   Candida tropicalis NOT DETECTED NOT DETECTED Final    Comment: Performed at [facility], [address]  [place], [place] [address]  Respiratory Panel by RT PCR (Flu A&B, Covid) - Nasopharyngeal Swab     Status: None   Collection Time: 05/30/19  5:07 PM   Specimen: Nasopharyngeal Swab  Result Value Ref Range Status   SARS Coronavirus 2 by RT PCR NEGATIVE NEGATIVE Final    Comment: (NOTE) SARS-CoV-2 target nucleic acids are NOT DETECTED. The SARS-CoV-2 RNA is generally detectable in upper respiratoy specimens during the acute phase of infection. The lowest concentration of SARS-CoV-2 viral copies this assay can detect is 131 copies/mL. A negative result does not preclude SARS-Cov-2 infection and should not be used as the sole basis for treatment or other patient management decisions. A negative result may occur with  improper specimen collection/handling, submission of specimen other than nasopharyngeal swab, presence of viral mutation(s) within the areas targeted by this assay, and inadequate number of viral copies (<131 copies/mL). A negative result must be combined with clinical observations, patient history, and epidemiological information. The expected result is Negative. Fact Sheet for Patients:  https://www.moore.com/ Fact Sheet for Healthcare Providers:  https://www.young.biz/ This test is not yet ap proved or cleared by the [place] FDA and  has been authorized for detection and/or diagnosis of SARS-CoV-2 by FDA under an Emergency Use Authorization (EUA). This EUA will remain  in effect (meaning this test can be used) for the duration of the COVID-19 declaration under Section 564(b)(1) of the Act, 21 U.S.C. section 360bbb-3(b)(1), unless the authorization is terminated or revoked sooner.    Influenza A by PCR NEGATIVE NEGATIVE Final   Influenza B by PCR NEGATIVE NEGATIVE Final    Comment: (NOTE) The Xpert Xpress SARS-CoV-2/FLU/RSV assay is intended as an aid in  the diagnosis of influenza from Nasopharyngeal swab specimens and  should not  be used as a sole basis for treatment. Nasal washings and  aspirates are unacceptable for Xpert Xpress SARS-CoV-2/FLU/RSV  testing. Fact Sheet for Patients: https://www.moore.com/ Fact Sheet for Healthcare Providers: https://www.young.biz/ This test is not yet approved or cleared by the [place] FDA and  has been authorized for detection and/or diagnosis of SARS-CoV-2 by  FDA under an Emergency Use Authorization (EUA). This EUA will remain  in effect (meaning this test can be used) for the duration of the  Covid-19 declaration under Section 564(b)(1) of the Act, 21  U.S.C. section 360bbb-3(b)(1), unless the authorization is  terminated or revoked. Performed at [facility], [address] [place], [place] [address]   Blood Culture (routine x  2)     Status: None (Preliminary result)   Collection Time: 05/30/19  6:25 PM   Specimen: BLOOD RIGHT FOREARM  Result Value Ref Range Status   Specimen Description BLOOD RIGHT FOREARM  Final   Special Requests   Final    BOTTLES DRAWN AEROBIC AND ANAEROBIC Blood Culture results may not be optimal due to an inadequate volume of blood received in culture bottles   Culture   Final    NO GROWTH < 24 HOURS Performed at [facility], [address] [place], [place] [address]    Report Status PENDING  Incomplete    Gardiner Barefoot, MD [facility] [email or web address] 05/31/2019, 1:17 PM

## 2019-05-31 NOTE — Transfer of Care (Signed)
Immediate Anesthesia Transfer of Care Note  Patient: Herbert Walsh  Procedure(s) Performed: IRRIGATION AND DEBRIDEMENT LOWER EXTREMITY (Right Knee)  Patient Location: PACU  Anesthesia Type:General  Level of Consciousness: awake, alert , oriented and patient cooperative  Airway & Oxygen Therapy: Patient Spontanous Breathing  Post-op Assessment: Report given to RN and Post -op Vital signs reviewed and stable  Post vital signs: Reviewed and stable  Last Vitals: 147/63, 138, 32, 94% RA Vitals Value Taken Time  BP    Temp    Pulse    Resp    SpO2      Last Pain:  Vitals:   05/31/19 1215  TempSrc:   PainSc: (P) Asleep         Complications: No apparent anesthesia complications

## 2019-05-31 NOTE — Progress Notes (Signed)
Day of Surgery Procedure(s) (LRB): IRRIGATION AND DEBRIDEMENT LOWER EXTREMITY (Right) Subjective: Patient examined, report of chest MRI reviewed. On IV antibiotics the patient's white count upper anterior chest wall pain , tenderness has improved.  On exam there is some erythema over the right sternoclavicular joint but no fluctuance or localized soft tissue fluid collection. Based on my exam patient today I feel that the right sternoclavicular joint has inflammation and induration but surgical incision and drainage would not be beneficial at this time but continue with IV antibiotics and observation. Objective: Vital signs in last 24 hours: Temp:  [97.6 F (36.4 C)-99.7 F (37.6 C)] 98.2 F (36.8 C) (04/17 1230) Pulse Rate:  [112-142] 122 (04/17 1230) Cardiac Rhythm: Sinus tachycardia (04/17 1230) Resp:  [11-48] 22 (04/17 1230) BP: (111-167)/(63-99) 143/79 (04/17 1230) SpO2:  [87 %-100 %] 96 % (04/17 1230)  Hemodynamic parameters for last 24 hours:  Sinus rhythm afebrile  Intake/Output from previous day: 04/16 0701 - 04/17 0700 In: 3267.8 [I.V.:1238.3; IV Piggyback:2029.5] Out: 700 [Urine:700] Intake/Output this shift: Total I/O In: 1400 [I.V.:1400] Out: 310 [Urine:300; Other:10]  Exam No murmur No localized fluid collection identified over either sternoclavicular joint or the lower neck.  Tissue appears to be indurated and inflamed consistent with cellulitis.  Lab Results: Recent Labs    05/30/19 1050 05/31/19 0318  WBC 19.8* 17.2*  HGB 12.7* 11.7*  HCT 40.8 36.8*  PLT 377 354   BMET:  Recent Labs    05/30/19 1050 05/31/19 0318  NA 128* 132*  K 4.1 4.3  CL 94* 100  CO2 20* 21*  GLUCOSE 268* 194*  BUN 8 8  CREATININE 0.95 0.79  CALCIUM 9.3 8.6*    PT/INR:  Recent Labs    05/30/19 1520  LABPROT 14.2  INR 1.1   ABG No results found for: PHART, HCO3, TCO2, ACIDBASEDEF, O2SAT CBG (last 3)  Recent Labs    05/31/19 0749  GLUCAP 191*     Assessment/Plan: S/P Procedure(s) (LRB): IRRIGATION AND DEBRIDEMENT LOWER EXTREMITY (Right) Continue IV antibiotics Close observation of the chest wall infection.   LOS: 1 day    Kathlee Nations Trigt III 05/31/2019

## 2019-05-31 NOTE — Anesthesia Procedure Notes (Signed)
Procedure Name: Intubation Date/Time: 05/31/2019 11:23 AM Performed by: Sonda Primes, CRNA Pre-anesthesia Checklist: Patient identified, Emergency Drugs available, Suction available and Patient being monitored Patient Re-evaluated:Patient Re-evaluated prior to induction Oxygen Delivery Method: Circle System Utilized Preoxygenation: Pre-oxygenation with 100% oxygen Induction Type: IV induction Ventilation: Mask ventilation without difficulty Laryngoscope Size: Glidescope and 4 Grade View: Grade I Tube type: Oral Tube size: 8.0 mm Number of attempts: 1 Airway Equipment and Method: Stylet and Oral airway Placement Confirmation: ETT inserted through vocal cords under direct vision,  positive ETCO2 and breath sounds checked- equal and bilateral Secured at: 24 cm Tube secured with: Tape Dental Injury: Teeth and Oropharynx as per pre-operative assessment

## 2019-05-31 NOTE — Anesthesia Postprocedure Evaluation (Signed)
Anesthesia Post Note  Patient: Herbert Walsh  Procedure(s) Performed: IRRIGATION AND DEBRIDEMENT LOWER EXTREMITY (Right Knee)     Patient location during evaluation: PACU Anesthesia Type: General Level of consciousness: awake Pain management: pain level controlled Vital Signs Assessment: post-procedure vital signs reviewed and stable Respiratory status: spontaneous breathing, nonlabored ventilation, respiratory function stable and patient connected to nasal cannula oxygen Cardiovascular status: blood pressure returned to baseline and stable Postop Assessment: no apparent nausea or vomiting Anesthetic complications: no    Last Vitals:  Vitals:   05/31/19 1230 05/31/19 1356  BP: (!) 143/79 135/78  Pulse: (!) 122 (!) 118  Resp: (!) 22 (!) 24  Temp: 36.8 C (!) 36.4 C  SpO2: 96% 93%    Last Pain:  Vitals:   05/31/19 1356  TempSrc: Oral  PainSc:                  Treasa Bradshaw P Naylee Frankowski

## 2019-05-31 NOTE — Op Note (Signed)
   Date of Surgery: 05/31/2019  INDICATIONS: Herbert Walsh is a 29 y.o.-year-old male with a right knee septic arthritis;  The patient did consent to the procedure after discussion of the risks and benefits.  PREOPERATIVE DIAGNOSIS: Septic arthritis right knee  POSTOPERATIVE DIAGNOSIS: Same.  PROCEDURE: Arthrotomy of right knee joint for washout for septic arthritis  SURGEON: N. Glee Arvin, M.D.  ASSIST: Starlyn Skeans Max, [place]; necessary for the timely completion of procedure and due to complexity of procedure.  ANESTHESIA:  general  IV FLUIDS AND URINE: See anesthesia.  ESTIMATED BLOOD LOSS: minimal mL.  IMPLANTS: none  DRAINS: HVAC  COMPLICATIONS: see description of procedure.  DESCRIPTION OF PROCEDURE: The patient was brought to the operating room.  The patient had been signed prior to the procedure and this was documented. The patient had the anesthesia placed by the anesthesiologist.  A time-out was performed to confirm that this was the correct patient, site, side and location. The patient did receive antibiotics prior to the incision and was re-dosed during the procedure as needed at indicated intervals.  A tourniquet was placed.  The patient had the operative extremity prepped and draped in the standard surgical fashion.    A small 2 inch incision was made on the medial border of the patella. Dissection was carried down through the subcutaneous tissue onto the medial retinaculum which was then sharply incised. There was a return of gross purulence which was evacuated entirely. The knee joint was then thoroughly irrigated with 6 L of normal saline. A medium Hemovac was placed in the joint and brought out superior laterally. The arthrotomy was closed with interrupted PDS. Layered closure was performed. Sterile dressings were applied. Patient tolerated procedure well had no immediate complications.  POSTOPERATIVE PLAN: Continue IV antibiotics. Weight-bear as tolerated. Range of  motion as tolerated. Follow clinically.  Mayra Reel, MD 11:53 AM

## 2019-05-31 NOTE — Progress Notes (Addendum)
I aspirated the knee this morning and obtained 30 cc of murky purulent fluid consistent with septic arthritis.  Will take patient to OR for I&D.  In terms of the MRI findings related to the calf, there doesn't appear to be any drainable abscesses or fluid collections.  Recommend IV abx for this.  Could consider repeat MRI if there isn't clinical improvement and there is concern that abscess may have developed.  Knee aspiration fluid has been sent to lab for analysis.    Mayra Reel, MD [facility] [phone] 10:16 AM

## 2019-05-31 NOTE — Progress Notes (Signed)
Pt to MRI. MRI called this RN that the pt is having increased pain on the right leg and having trouble staying still. Made MD aware, additional pain med and anxiety meds was ordered and given. Will continue to monitor pt.

## 2019-05-31 NOTE — Anesthesia Preprocedure Evaluation (Addendum)
Anesthesia Evaluation  Patient identified by MRN, date of birth, ID bandGeneral Assessment Comment:Patient awake  Reviewed: Allergy & Precautions, NPO status , Patient's Chart, lab work & pertinent test results  Airway Mallampati: IV  TM Distance: >3 FB Neck ROM: Full  Mouth opening: Limited Mouth Opening  Dental  (+) Poor Dentition, Chipped   Pulmonary Current Smoker and Patient abstained from smoking.,    Pulmonary exam normal breath sounds clear to auscultation       Cardiovascular hypertension, Pt. on medications  Rhythm:Regular Rate:Tachycardia  ECG: ST, rate 119   Neuro/Psych PSYCHIATRIC DISORDERS Anxiety PTSD (post-traumatic stress disorder)negative neurological ROS     GI/Hepatic negative GI ROS, Neg liver ROS,   Endo/Other  negative endocrine ROS  Renal/GU negative Renal ROS     Musculoskeletal  (+) Arthritis , Rheumatoid disorders,  Septic arthritis of knee, right    Abdominal   Peds  Hematology  (+) anemia , HLD   Anesthesia Other Findings RIGHT LOWER LEG ABSCESS  Reproductive/Obstetrics                           Anesthesia Physical Anesthesia Plan  ASA: III  Anesthesia Plan: General   Post-op Pain Management:    Induction: Intravenous and Rapid sequence  PONV Risk Score and Plan: 2 and Ondansetron, Dexamethasone, Midazolam and Treatment may vary due to age or medical condition  Airway Management Planned: Oral ETT and Video Laryngoscope Planned  Additional Equipment:   Intra-op Plan:   Post-operative Plan: Extubation in OR and Possible Post-op intubation/ventilation  Informed Consent: I have reviewed the patients History and Physical, chart, labs and discussed the procedure including the risks, benefits and alternatives for the proposed anesthesia with the patient or authorized representative who has indicated his/her understanding and acceptance.     Dental advisory  given  Plan Discussed with: CRNA  Anesthesia Plan Comments:        Anesthesia Quick Evaluation

## 2019-06-01 ENCOUNTER — Inpatient Hospital Stay (HOSPITAL_COMMUNITY): Payer: BC Managed Care – PPO

## 2019-06-01 DIAGNOSIS — R7881 Bacteremia: Secondary | ICD-10-CM | POA: Diagnosis not present

## 2019-06-01 LAB — ECHOCARDIOGRAM COMPLETE
Height: 72 in
Weight: 4480 oz

## 2019-06-01 LAB — BASIC METABOLIC PANEL
Anion gap: 10 (ref 5–15)
BUN: 8 mg/dL (ref 6–20)
CO2: 19 mmol/L — ABNORMAL LOW (ref 22–32)
Calcium: 8.4 mg/dL — ABNORMAL LOW (ref 8.9–10.3)
Chloride: 105 mmol/L (ref 98–111)
Creatinine, Ser: 0.67 mg/dL (ref 0.61–1.24)
GFR calc Af Amer: >60 mL/min (ref >60)
GFR calc non Af Amer: >60 mL/min (ref >60)
Glucose, Bld: 184 mg/dL — ABNORMAL HIGH (ref 70–99)
Potassium: 4 mmol/L (ref 3.5–5.1)
Sodium: 134 mmol/L — ABNORMAL LOW (ref 135–145)

## 2019-06-01 LAB — CBC
HCT: 32.5 % — ABNORMAL LOW (ref 39.0–52.0)
Hemoglobin: 10.3 g/dL — ABNORMAL LOW (ref 13.0–17.0)
MCH: 25 pg — ABNORMAL LOW (ref 26.0–34.0)
MCHC: 31.7 g/dL (ref 30.0–36.0)
MCV: 78.9 fL — ABNORMAL LOW (ref 80.0–100.0)
Platelets: 359 10*3/uL (ref 150–400)
RBC: 4.12 MIL/uL — ABNORMAL LOW (ref 4.22–5.81)
RDW: 16.6 % — ABNORMAL HIGH (ref 11.5–15.5)
WBC: 15.9 10*3/uL — ABNORMAL HIGH (ref 4.0–10.5)
nRBC: 0 % (ref 0.0–0.2)

## 2019-06-01 LAB — GLUCOSE, CAPILLARY
Glucose-Capillary: 175 mg/dL — ABNORMAL HIGH (ref 70–99)
Glucose-Capillary: 165 mg/dL — ABNORMAL HIGH (ref 70–99)
Glucose-Capillary: 166 mg/dL — ABNORMAL HIGH (ref 70–99)
Glucose-Capillary: 148 mg/dL — ABNORMAL HIGH (ref 70–99)

## 2019-06-01 IMAGING — DX DG ORTHOPANTOGRAM /PANORAMIC
1 series · 1 of 1 positions shown · non-contrast
Comparison: None.

CLINICAL DATA: Bacteremia.

EXAM:
ORTHOPANTOGRAM/PANORAMIC

[view not recorded]
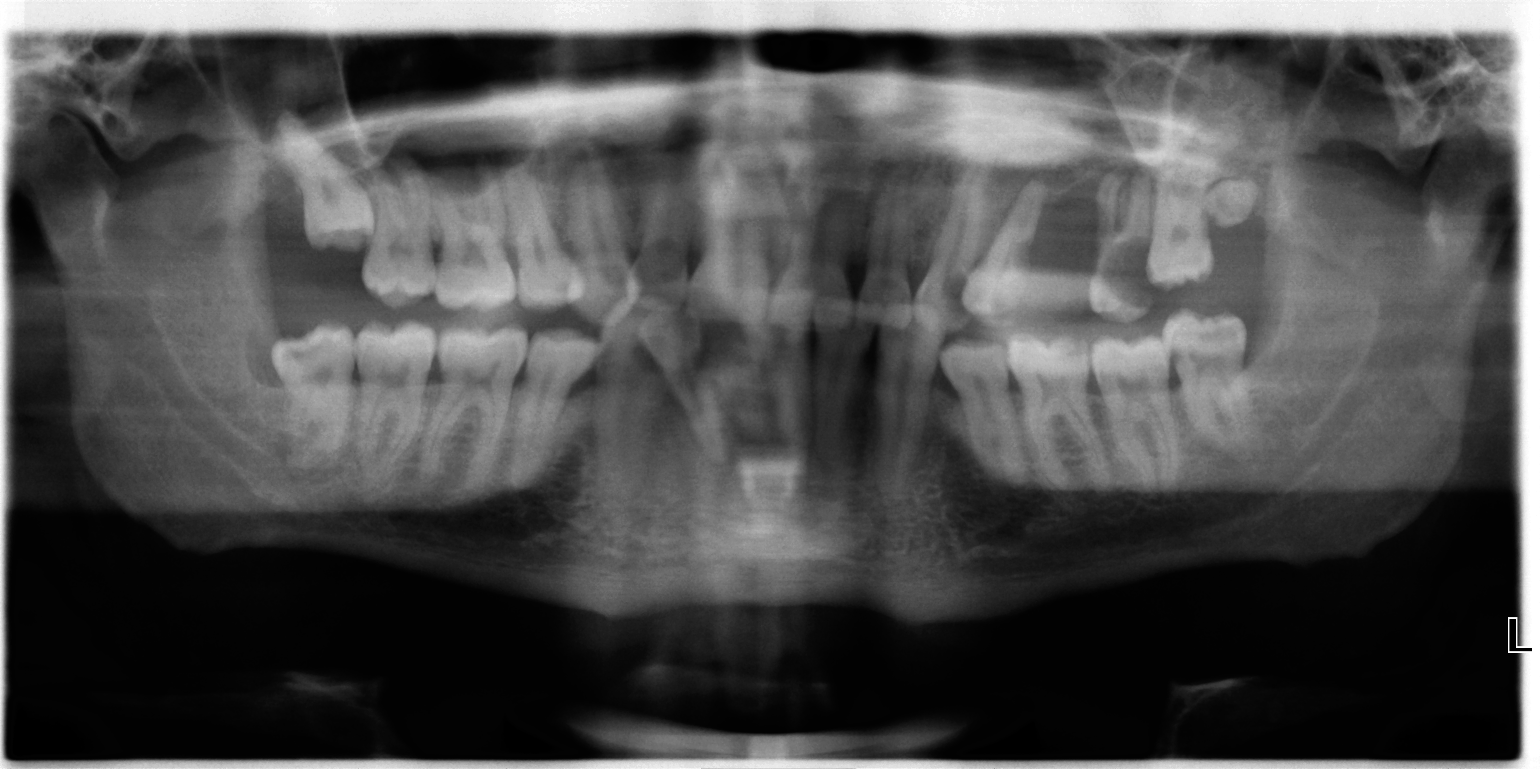

[1 of 1 positions shown; findings below may reference images not displayed]

FINDINGS: No fracture or dislocation is seen involving the mandible. No lytic
destruction is seen involving the mandible. Erosion of left
posterior molar in the maxilla is noted suggesting dental disease.
IMPRESSION: Erosion of left posterior molar in the maxilla is noted suggesting
dental disease. No fracture is noted. No lytic destruction is seen
involving the mandible. No lytic destruction is noted.

## 2019-06-01 IMAGING — DX DG CHEST 1V PORT
1 series · 1 of 1 positions shown · non-contrast
Comparison: [DATE] chest radiograph.

CLINICAL DATA: Chest pain, sepsis, lower extremity swelling

EXAM:
PORTABLE CHEST 1 VIEW

[chest ap]
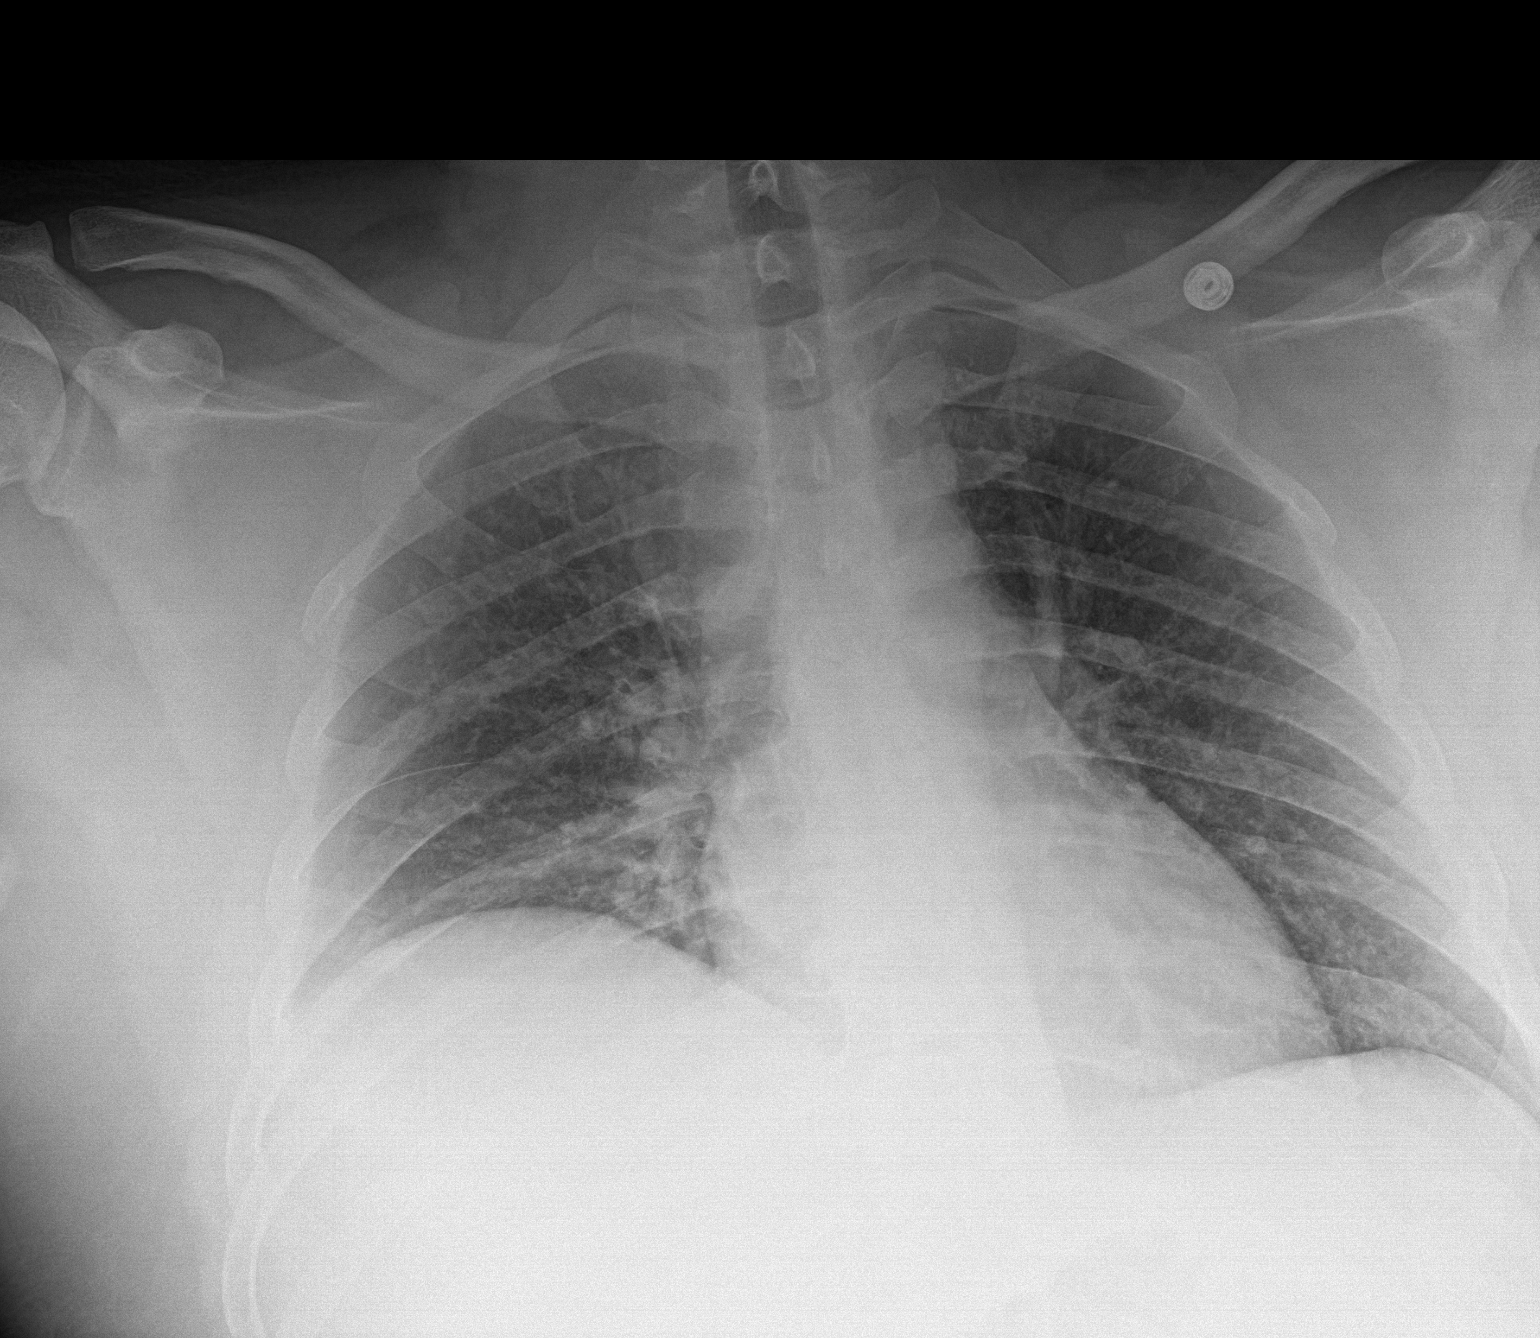

[1 of 1 positions shown; findings below may reference images not displayed]

FINDINGS: Stable cardiomediastinal silhouette with normal heart size. No
pneumothorax. No pleural effusion. Lungs appear clear, with no acute
consolidative airspace disease and no pulmonary edema.
IMPRESSION: No active disease.

## 2019-06-01 MED ORDER — LITHIUM CARBONATE ER 450 MG PO TBCR
750.0000 mg | EXTENDED_RELEASE_TABLET | Freq: Every evening | ORAL | Status: DC
  Administered 2019-06-02 – 2019-06-05 (×4): 750 mg via ORAL
  Filled 2019-06-01 (×6): qty 1

## 2019-06-01 NOTE — Progress Notes (Signed)
  Echocardiogram 2D Echocardiogram has been performed.  Herbert Walsh 06/01/2019, 3:33 PM

## 2019-06-01 NOTE — Progress Notes (Addendum)
   Subjective:  Herbert Walsh is a 29 y.o. M with PMH of RA, HTN, HLD, PTSD admit for MSSA bacteremia on hospital day 2  Herbert Walsh was examined and evaluated at bedside this am.  He states that he is endorsing significant tenderness around the surgical site in his right knee.  He states his clavicle is still tender but pain has improved.  He denies any fevers or chills overnight.  He denies any chest pain, shortness of breath, nausea, vomiting.  Mentions that he has not yet had a bowel movement since admission.  Objective:  Vital signs in last 24 hours: Vitals:   05/31/19 1600 05/31/19 2033 05/31/19 2333 06/01/19 0421  BP: 121/83 (!) 105/53 137/83 120/64  Pulse: 100 (!) 105 (!) 111 95  Resp: 20 (!) 24  17  Temp: 98.2 F (36.8 C) 97.9 F (36.6 C) 98.3 F (36.8 C) 98.1 F (36.7 C)  TempSrc: Oral Oral Oral Oral  SpO2: 97% 100% 94% 95%  Weight:      Height:       Gen: Well-developed, well nourished, NAD Neck: supple, ROM intact, no JVD, no cervical adenopathy CV: RRR, S1, S2 normal, No rubs, no murmurs, no gallops Pulm: CTAB, No rales, no wheezes  Abd: Soft, BS+, NTND, No rebound, no guarding Extm: ROM intact, Right knee w/ wound vac in place w/ sanguinous drainage. 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 M with PMH of RA, HTN, HLD, PTSD admit for MSSA bacteremia with septic arthritis.  MSSA Bacteremia R Knee septic arthritis RLE Myositis / Fascitis Sternoclavicular Septic Arthritis/Osteomyelitis Disseminated MSSA infection with multiple joint involvement. S/p R knee washout. CT surg recommending medical  management for sternoclavicular joint infection. ID on board. Currently on cefazolin. Wbc 19.8->17.2->15.9. Afebrile overnight. Vitals stable. - Appreciate ortho, ct surg, ID recs - F/u TTE - Cards consult for TEE - C/w Cefazolin (Day 3 of abx) - F/u blood cultures - Trend cbc - Monitor vitals, new joint involvements - Dilaudid PRN for pain  PTSD/Intermittent Explosive Disorder C/w home meds: citalopram 40mg  daily, lithium 450mg /300mg  BID, Risperdal 4mg  qhs, Sertraline 50mg  daily  DVT prophx: Lovenox Diet: Cm Bowel: Miralax Code: Full  Dispo: Anticipated discharge in approximately 11-14 day(s).   , MD 06/01/2019, 6:42 AM Pager: [phone]

## 2019-06-01 NOTE — Progress Notes (Signed)
1 Day Post-Op Procedure(s) (LRB): IRRIGATION AND DEBRIDEMENT LOWER EXTREMITY (Right) Subjective: Right shoulder and right leg still sore Patient afebrile with improving white count No deformity or fluctuant collection over the right or left sternoclavicular joints Patient being evaluated now with echocardiogram to rule out endocarditis Objective: Vital signs in last 24 hours: Temp:  [97.8 F (36.6 C)-98.3 F (36.8 C)] 97.8 F (36.6 C) (04/18 0744) Pulse Rate:  [95-111] 109 (04/18 0744) Cardiac Rhythm: Sinus tachycardia (04/18 0700) Resp:  [17-24] 20 (04/18 0744) BP: (105-137)/(53-83) 127/69 (04/18 0744) SpO2:  [94 %-100 %] 96 % (04/18 0744)  Hemodynamic parameters for last 24 hours:  Afebrile sinus rhythm  Intake/Output from previous day: 04/17 0701 - 04/18 0700 In: 4080.3 [P.O.:480; I.V.:2979.8; IV Piggyback:620.5] Out: 1980 [Urine:1950; Drains:20] Intake/Output this shift: Total I/O In: 580 [P.O.:580] Out: -        Exam    General- alert and comfortable    Neck- no JVD, no cervical adenopathy palpable, no carotid bruit   Lungs- clear without rales, wheezes.  No severe chest wall tenderness or deformity   Cor- regular rate and rhythm, no murmur , gallop   Abdomen- soft, non-tender   Extremities - warm, non-tender, minimal edema   Neuro- oriented, appropriate, no focal weakness   Lab Results: Recent Labs    05/31/19 0318 06/01/19 0629  WBC 17.2* 15.9*  HGB 11.7* 10.3*  HCT 36.8* 32.5*  PLT 354 359   BMET:  Recent Labs    05/31/19 0318 06/01/19 0629  NA 132* 134*  K 4.3 4.0  CL 100 105  CO2 21* 19*  GLUCOSE 194* 184*  BUN 8 8  CREATININE 0.79 0.67  CALCIUM 8.6* 8.4*    PT/INR:  Recent Labs    05/30/19 1520  LABPROT 14.2  INR 1.1   ABG No results found for: PHART, HCO3, TCO2, ACIDBASEDEF, O2SAT CBG (last 3)  Recent Labs    05/31/19 2004 06/01/19 0741 06/01/19 1150  GLUCAP 184* 175* 165*    Assessment/Plan: S/P Procedure(s)  (LRB): IRRIGATION AND DEBRIDEMENT LOWER EXTREMITY (Right) Continue IV antibiotics and clinical exam of sternoclavicular joints.  I will repeat follow-up chest CT scan midweek.   LOS: 2 days    Kathlee Nations Trigt III 06/01/2019

## 2019-06-01 NOTE — Plan of Care (Signed)

## 2019-06-02 ENCOUNTER — Inpatient Hospital Stay (HOSPITAL_COMMUNITY): Payer: BC Managed Care – PPO

## 2019-06-02 DIAGNOSIS — Z978 Presence of other specified devices: Secondary | ICD-10-CM

## 2019-06-02 LAB — CULTURE, BLOOD (ROUTINE X 2)

## 2019-06-02 LAB — CBC
HCT: 32.5 % — ABNORMAL LOW (ref 39.0–52.0)
Hemoglobin: 10.2 g/dL — ABNORMAL LOW (ref 13.0–17.0)
MCH: 24.5 pg — ABNORMAL LOW (ref 26.0–34.0)
MCHC: 31.4 g/dL (ref 30.0–36.0)
MCV: 78.1 fL — ABNORMAL LOW (ref 80.0–100.0)
Platelets: 410 10*3/uL — ABNORMAL HIGH (ref 150–400)
RBC: 4.16 MIL/uL — ABNORMAL LOW (ref 4.22–5.81)
RDW: 17 % — ABNORMAL HIGH (ref 11.5–15.5)
WBC: 13.6 10*3/uL — ABNORMAL HIGH (ref 4.0–10.5)
nRBC: 0 % (ref 0.0–0.2)

## 2019-06-02 LAB — BODY FLUID CULTURE

## 2019-06-02 LAB — GLUCOSE, CAPILLARY
Glucose-Capillary: 150 mg/dL — ABNORMAL HIGH (ref 70–99)
Glucose-Capillary: 140 mg/dL — ABNORMAL HIGH (ref 70–99)
Glucose-Capillary: 128 mg/dL — ABNORMAL HIGH (ref 70–99)
Glucose-Capillary: 137 mg/dL — ABNORMAL HIGH (ref 70–99)

## 2019-06-02 LAB — GC/CHLAMYDIA PROBE AMP (~~LOC~~) NOT AT ARMC
Chlamydia: NEGATIVE
Comment: NEGATIVE
Comment: NORMAL
Neisseria Gonorrhea: NEGATIVE

## 2019-06-02 IMAGING — CT CT CHEST W/ CM
2 of 4 series · 15 of 36 positions shown, 18 images · IV contrast (Omni 300)
Comparison: [DATE]

CLINICAL DATA: History of bilateral sternoclavicular joint septic
arthritis/osteomyelitis.

EXAM:
CT CHEST WITH CONTRAST
TECHNIQUE: Multidetector CT imaging of the chest was performed during
intravenous contrast administration.
CONTRAST:  75mL OMNIPAQUE IOHEXOL 300 MG/ML  SOLN

[Series 3: chest with 2mm st · axial · 0.97mm/px · z∈[+1292,+1662]mm · 12 of 213 slices shown, 15 images]
[im 14/213  mediastinal]
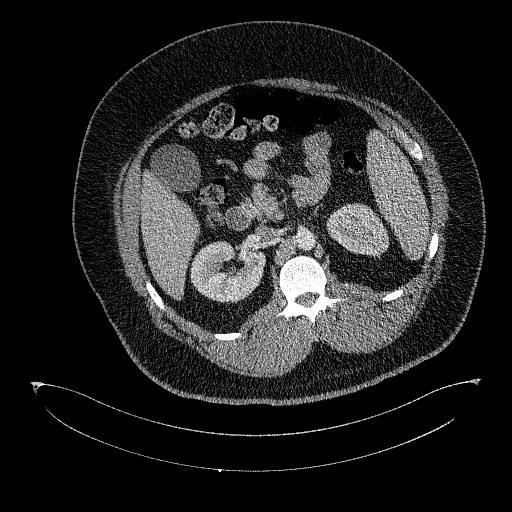
[im 14/213  lung]
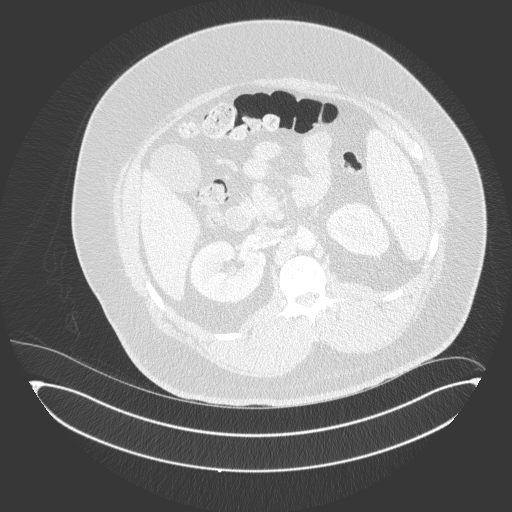
[im 27/213  lung]
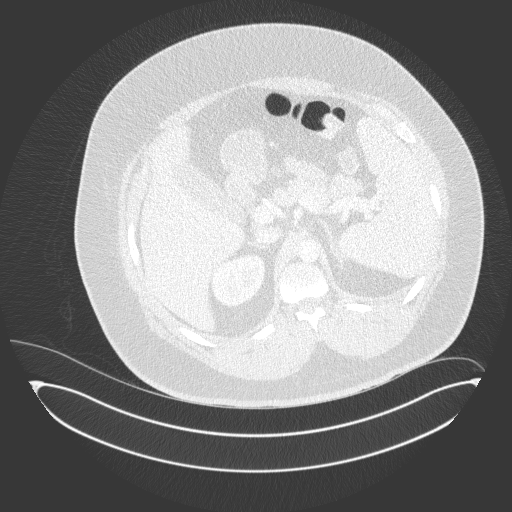
[im 54/213  lung]
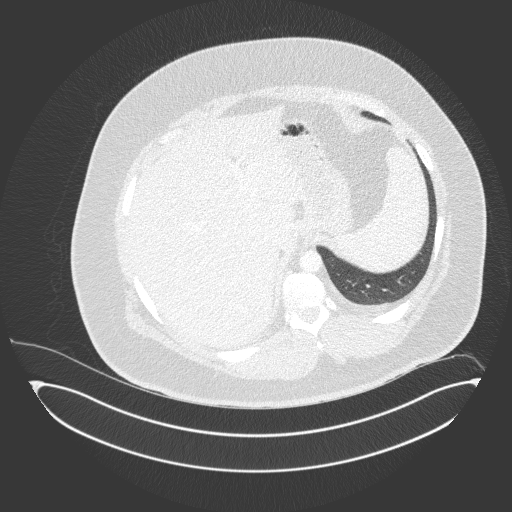
[im 67/213  lung]
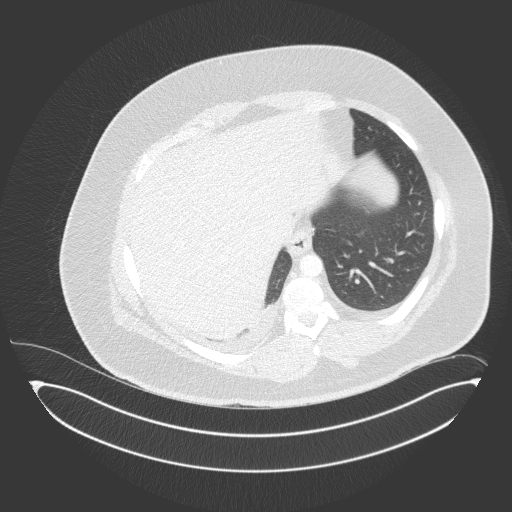
[im 80/213  mediastinal]
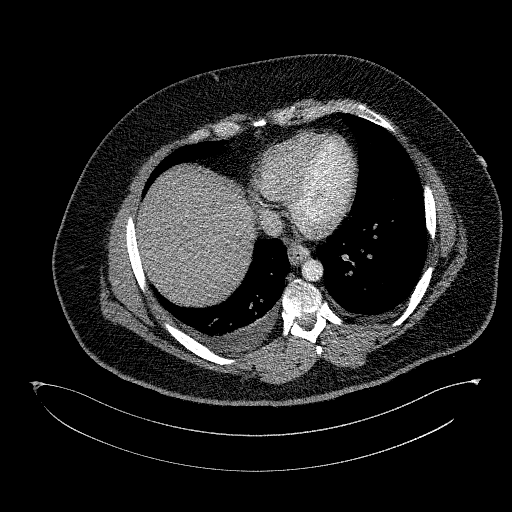
[im 80/213  lung]
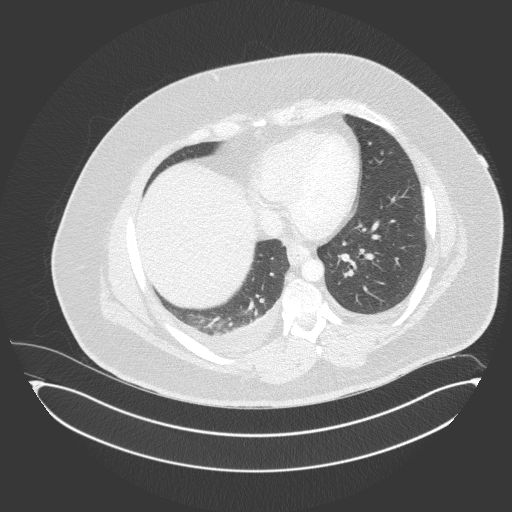
[im 93/213  lung]
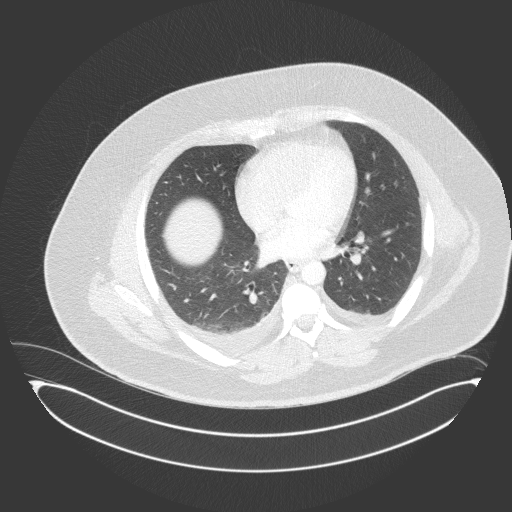
[im 120/213  lung]
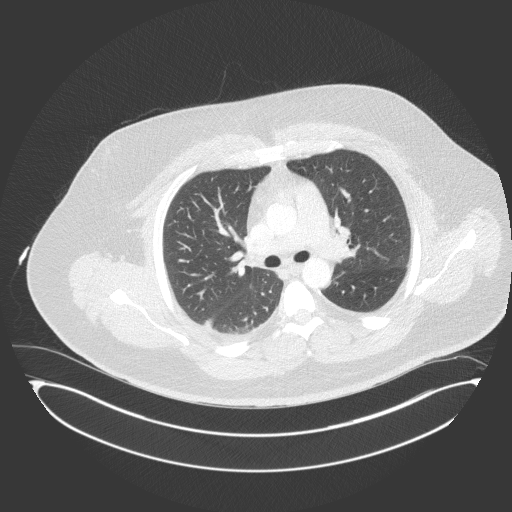
[im 133/213  lung]
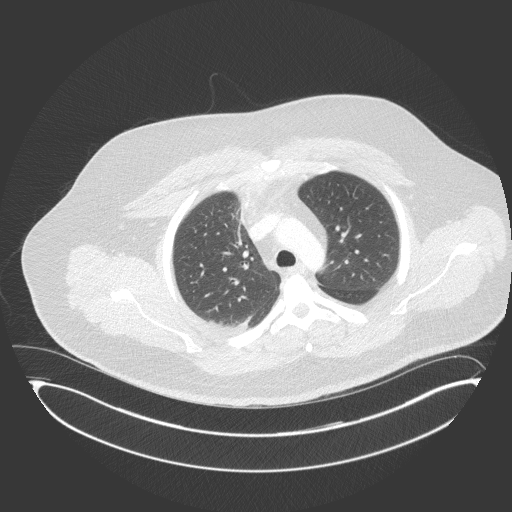
[im 146/213  mediastinal]
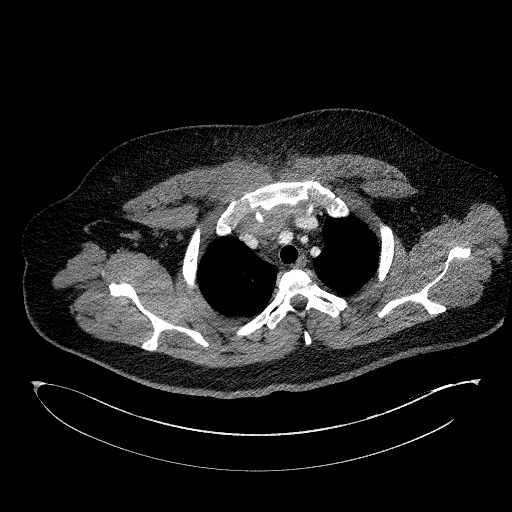
[im 146/213  lung]
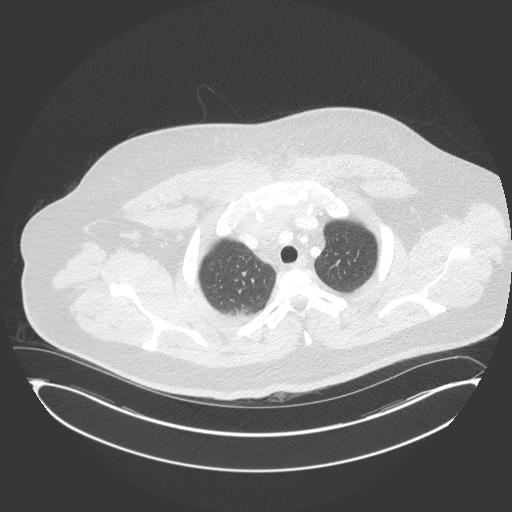
[im 160/213  lung]
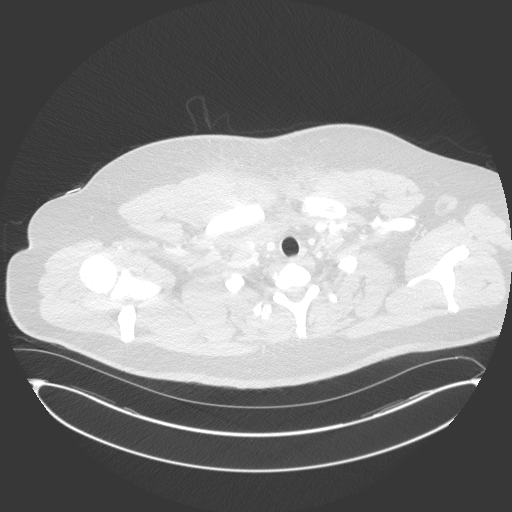
[im 186/213  lung]
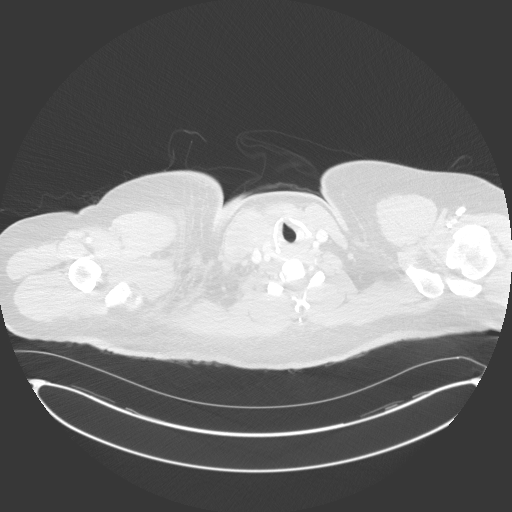
[im 199/213  lung]
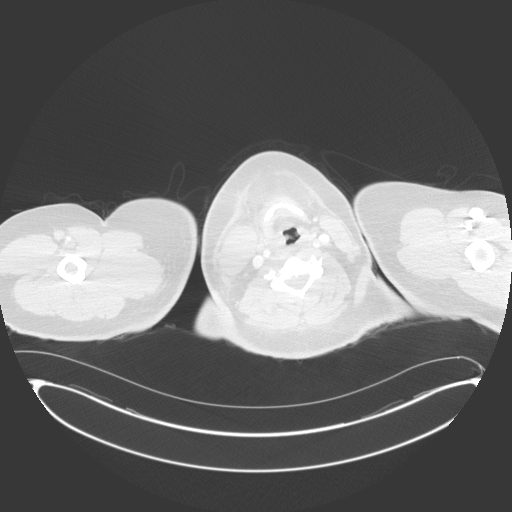

[Series 6: chest with 2mm st cor · coronal · 0.91mm/px · 3 of 195 slices shown]
[im 39/195  lung]
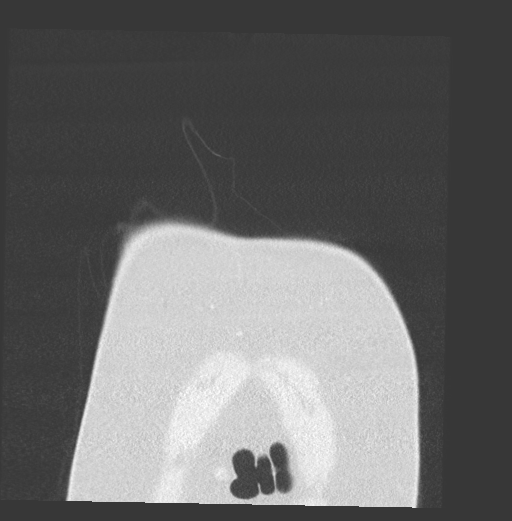
[im 78/195  lung]
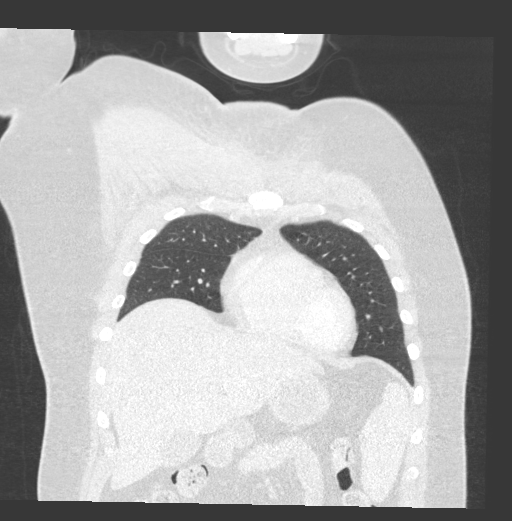
[im 117/195  lung]
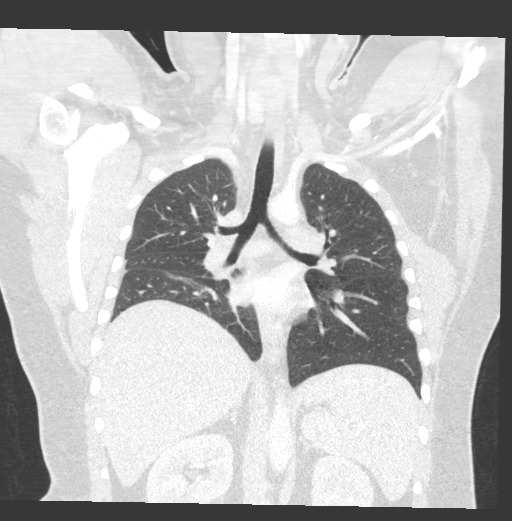

[15 of 36 positions shown; findings below may reference images not displayed]

FINDINGS: Cardiovascular: The heart size is normal. No substantial pericardial
effusion.

Mediastinum/Nodes: Abnormal soft tissue posterior to the sternum and
sternoclavicular joints, right greater than left, is similar to
[DATE] CT chest. No mediastinal lymphadenopathy. There is no
hilar lymphadenopathy. The esophagus has normal imaging features.
There is no axillary lymphadenopathy.

Lungs/Pleura: Minimal collapse/consolidative change noted in the
dependent right lower lobe with small bilateral pleural effusions,
right greater than left.

Upper Abdomen: Unremarkable.

Musculoskeletal: No worrisome lytic or sclerotic osseous
abnormality.In the interval since that prior CT, there has been
asymmetric enlargement of the medial aspect of the right pectoralis
major muscle and the right sternocleidomastoid muscle. Although
quite subtle, there does appear to be a focal low-attenuation lesion
in the right sternocleido mastoid muscle (see axial image 33/3 and
coronal image 110/6). This lesion measures 3.1 x 2.6 x 4.2 cm and is
suspicious for an intramuscular fluid collection.
IMPRESSION: 1. Although quite subtle, there appears to be an intramuscular fluid
collection involving the right sternocleidomastoid muscle, new since
CT of [DATE]. Given the history of sternoclavicular joint
infection, intramuscular abscess would be a concern. Given that the
finding is so subtle, MRI is recommended to confirm. Ultrasound may
be able to further assess.
2. Tiny bilateral pleural effusions with some minimal
collapse/consolidative change in the dependent right lower lobe.
3. Similar appearance of the retrosternal soft tissue attenuation
described on the prior CT.

## 2019-06-02 MED ORDER — HYDROMORPHONE HCL 1 MG/ML IJ SOLN
1.0000 mg | INTRAMUSCULAR | Status: DC | PRN
  Administered 2019-06-03 – 2019-06-05 (×4): 1 mg via INTRAVENOUS
  Filled 2019-06-02 (×4): qty 1

## 2019-06-02 MED ORDER — ENOXAPARIN SODIUM 40 MG/0.4ML ~~LOC~~ SOLN
40.0000 mg | SUBCUTANEOUS | Status: DC
  Administered 2019-06-03: 40 mg via SUBCUTANEOUS
  Filled 2019-06-02: qty 0.4

## 2019-06-02 MED ORDER — MUPIROCIN 2 % EX OINT
1.0000 "application " | TOPICAL_OINTMENT | Freq: Two times a day (BID) | CUTANEOUS | Status: DC
  Administered 2019-06-02 – 2019-06-06 (×8): 1 via NASAL
  Filled 2019-06-02: qty 22

## 2019-06-02 MED ORDER — OXYCODONE-ACETAMINOPHEN 5-325 MG PO TABS
1.0000 | ORAL_TABLET | ORAL | Status: DC | PRN
  Administered 2019-06-02 – 2019-06-06 (×16): 2 via ORAL
  Administered 2019-06-06: 1 via ORAL
  Administered 2019-06-06: 2 via ORAL
  Filled 2019-06-02 (×19): qty 2

## 2019-06-02 MED ORDER — IOHEXOL 300 MG/ML  SOLN
75.0000 mL | Freq: Once | INTRAMUSCULAR | Status: AC | PRN
  Administered 2019-06-02: 75 mL via INTRAVENOUS

## 2019-06-02 MED ORDER — CHLORHEXIDINE GLUCONATE CLOTH 2 % EX PADS
6.0000 | MEDICATED_PAD | Freq: Every day | CUTANEOUS | Status: AC
  Administered 2019-06-02 – 2019-06-06 (×5): 6 via TOPICAL

## 2019-06-02 NOTE — Progress Notes (Signed)
Patient under yellow MEWS protocol when his HR met criteria for yellow MEWS alert. Patient given scheduled BP meds per MAR. Will continue Q4 VS for monitoring.

## 2019-06-02 NOTE — Plan of Care (Signed)

## 2019-06-02 NOTE — Progress Notes (Signed)
  Date: 06/02/2019  Patient name: Herbert Walsh  Medical record number: [id number]  Date of birth: 1990-05-19        I have seen and evaluated this patient and I have discussed the plan of care with the house staff. Please see Dr. Mallie Mussel note for complete details. I concur with his findings and plan.   Inez Catalina, MD 06/02/2019, 6:11 PM

## 2019-06-02 NOTE — Consult Note (Signed)
Chief Complaint: Patient was seen in consultation today for right sternocleidomastoid aspiration  Referring Physician(s): Dr. Kathlee Nations Trigt  Supervising Physician: Simonne Come  Patient Status: [facility] - In-pt  History of Present Illness: Herbert Walsh is a 29 y.o. male with a past medical history significant for RA, HTN, HLD, PTSD, MSSA bacteremia and septic arthritis who presented to [facility] ED on 05/29/19 with complaints of right shoulder pain and leg cramps, he was evaluated and discharged on flexeril and naproxen. He again presented to [facility] ED the following day from his PCP's office for possible right lower extremity DVT - ultrasound for DVT was negative but initial evaluation showed tachycardia, leukocytosis and CK 1475. A CTA chest was obtained which showed no PE but did note soft tissue prominence and inflammatory changes around the right sternoclavicular joint suggesting possible septic arthritis. A CT right lower extremity was also obtained which noted a moderate knee joint effusion but did not show changes suggestive of septic arthritis. MRI of the sternum showed findings consistent with septic arthritis and osteomyelitis of the sternoclavicular joints as well as multiple abscess surrounding the right sternoclavicular joint and extending into the right supraclavicular tissues and right sternocleidomastoid muscle. CT surgery and orthopedics were consulted and the patient was admitted for further management. He underwent an arthrotomy and washout of the right knee joints for septic arthritis on 4/17 and was planned to continue IV antibiotics/observation for the right sternoclavicular/sternocleidomastoid inflammation. A repeat CT chest w/contrast was obtained today for follow up and an intramuscular fluid collection involving the right sternocleidomastoid muscle (new since 05/30/19) concerning for abscess was noted. IR has been asked to perform an image guided aspiration of this collection to further  direct care.  Patient seen laying flat in bed, mother at bedside. He reports significant pain over his right shoulder but denies any other areas of pain on his chest. He sometimes feels short of breath because of the pain, taking a deep breath does not worsen the pain. He is agreeable to the requested procedure and wishes to proceed.  Past Medical History:  Diagnosis Date  . Arthritis   . Hypertension 2019    Past Surgical History:  Procedure Laterality Date  . APPENDECTOMY    . I & D EXTREMITY Right 05/31/2019   Procedure: IRRIGATION AND DEBRIDEMENT LOWER EXTREMITY;  Surgeon: Tarry Kos, MD;  Location: [facility] OR;  Service: Orthopedics;  Laterality: Right;    Allergies: Patient has no known allergies.  Medications: Prior to Admission medications   Medication Sig Start Date End Date Taking? Authorizing Provider  atorvastatin (LIPITOR) 10 MG tablet Take 10 mg by mouth daily. 09/25/17  Yes [provider]  citalopram (CELEXA) 40 MG tablet Take 1 tablet (40 mg total) by mouth daily. 03/19/19  Yes Zena Amos, MD  cloNIDine (CATAPRES) 0.2 MG tablet Take 1 tablet (0.2 mg total) by mouth 3 (three) times daily. 06/16/19  Yes Neysa Hotter, MD  HYDROcodone-acetaminophen (NORCO/VICODIN) 5-325 MG tablet Take 1-2 tablets by mouth every 6 (six) hours as needed for pain. for pain 05/27/19  Yes [provider]  lisinopril (PRINIVIL,ZESTRIL) 10 MG tablet Take 10 mg by mouth daily.   Yes [provider]  lithium carbonate (ESKALITH) 450 MG CR tablet Total of 750 mg daily (400 mg + 300 mg) Patient taking differently: Take 450 mg by mouth See admin instructions. Total of 750 mg daily (450 mg + 300 mg) 06/16/19  Yes Hisada, Reina, MD  lithium carbonate (LITHOBID) 300 MG  CR tablet Total of 750 mg daily (400 mg + 300 mg) Patient taking differently: Take 300 mg by mouth See admin instructions. Total of 750 mg daily (450 mg + 300 mg) 06/16/19  Yes Hisada, Reina, MD  omeprazole (PRILOSEC) 40  MG capsule Take 40 mg by mouth daily. 05/06/19  Yes [provider]  predniSONE (DELTASONE) 5 MG tablet Take 10-60 mg by mouth as directed. Taper dose (50mg  today) 05/27/19  Yes [provider]  risperidone (RISPERDAL) 4 MG tablet Take 1 tablet (4 mg total) by mouth at bedtime. 06/16/19  Yes [name], MD  sertraline (ZOLOFT) 50 MG tablet 25 mg daily for one week, then 50 mg daily Patient taking differently: Take 50 mg by mouth daily. 25 mg daily for one week, then 50 mg daily 05/12/19  Yes Hisada, Elie Goody, MD  traMADol (ULTRAM) 50 MG tablet Take 50 mg by mouth every 8 (eight) hours as needed for pain. 05/01/19  Yes [provider]  traZODone (DESYREL) 150 MG tablet Take 1 tablet (150 mg total) by mouth at bedtime. 06/16/19  Yes [name], MD  cyclobenzaprine (FLEXERIL) 10 MG tablet Take 1 tablet (10 mg total) by mouth 3 (three) times daily as needed for muscle spasms. 05/29/19   Rolland Porter, MD  naproxen (NAPROSYN) 250 MG tablet Take 2 po BID with food prn pain 05/29/19   Rolland Porter, MD     Family History  Problem Relation Age of Onset  . Alcohol abuse Mother   . Depression Mother   . Drug abuse Mother   . Physical abuse Mother   . Sexual abuse Mother   . ADD / ADHD Paternal Aunt   . Alcohol abuse Maternal Grandfather   . Bipolar disorder Maternal Grandmother   . Schizophrenia Maternal Grandmother   . Drug abuse Maternal Grandmother   . Alcohol abuse Paternal Grandfather   . Depression Paternal Grandfather   . Depression Paternal Grandmother   . Drug abuse Paternal Grandmother     Social History   Socioeconomic History  . Marital status: Legally Separated    Spouse name: Not on file  . Number of children: Not on file  . Years of education: Not on file  . Highest education level: Not on file  Occupational History  . Not on file  Tobacco Use  . Smoking status: Current Every Day Smoker    Packs/day: 0.50    Years: 11.00    Pack years: 5.50    Types:  Cigarettes  . Smokeless tobacco: Never Used  Substance and Sexual Activity  . Alcohol use: No    Comment: one beer every 2-3 months  . Drug use: No  . Sexual activity: Not Currently  Other Topics Concern  . Not on file  Social History Narrative  . Not on file   Social Determinants of Health   Financial Resource Strain:   . Difficulty of Paying Living Expenses:   Food Insecurity:   . Worried About Charity fundraiser in the Last Year:   . Arboriculturist in the Last Year:   Transportation Needs:   . Film/video editor (Medical):   [name] Lack of Transportation (Non-Medical):   Physical Activity:   . Days of Exercise per Week:   . Minutes of Exercise per Session:   Stress:   . Feeling of Stress :   Social Connections:   . Frequency of Communication with Friends and Family:   . Frequency of Social Gatherings  with Friends and Family:   . Attends Religious Services:   . Active Member of Clubs or Organizations:   . Attends Banker Meetings:   [name] Marital Status:      Review of Systems: A 12 point ROS discussed and pertinent positives are indicated in the HPI above.  All other systems are negative.  Review of Systems  Constitutional: Negative for chills and fever.  Respiratory: Negative for cough and shortness of breath.   Cardiovascular: Negative for chest pain.  Gastrointestinal: Negative for abdominal pain, nausea and vomiting.  Musculoskeletal: Negative for neck pain.       (+) right shoulder/right upper chest pain  Neurological: Negative for dizziness and headaches.    Vital Signs: BP 128/80 (BP Location: Right Arm)   Pulse (!) 102   Temp 98.7 F (37.1 C) (Oral)   Resp 18   Ht 6' (1.829 m)   Wt 280 lb (127 kg)   SpO2 96%   BMI 37.97 kg/m   Physical Exam Vitals and nursing note reviewed.  Constitutional:      General: He is not in acute distress. HENT:     Head: Normocephalic.     Mouth/Throat:     Mouth: Mucous membranes are moist.      Pharynx: Oropharynx is clear. No oropharyngeal exudate or posterior oropharyngeal erythema.  Cardiovascular:     Rate and Rhythm: Regular rhythm. Tachycardia present.  Pulmonary:     Effort: Pulmonary effort is normal.     Breath sounds: Normal breath sounds.  Abdominal:     General: There is no distension.     Palpations: Abdomen is soft.  Musculoskeletal:        General: Swelling (over right sternoclavicular/upper right chest area - significantly TTP) and tenderness (right sternoclavicular joint/sternocleidomastoid) present.  Skin:    General: Skin is warm and dry.  Neurological:     Mental Status: He is alert and oriented to person, place, and time.  Psychiatric:        Mood and Affect: Mood normal.        Behavior: Behavior normal.        Thought Content: Thought content normal.        Judgment: Judgment normal.      MD Evaluation Airway: WNL Heart: WNL Abdomen: WNL Chest/ Lungs: WNL ASA  Classification: 2 Mallampati/Airway Score: Two   Imaging: DG Orthopantogram  Result Date: 06/01/2019 CLINICAL DATA:  Bacteremia. EXAM: ORTHOPANTOGRAM/PANORAMIC COMPARISON:  None. FINDINGS: No fracture or dislocation is seen involving the mandible. No lytic destruction is seen involving the mandible. Erosion of left posterior molar in the maxilla is noted suggesting dental disease. IMPRESSION: Erosion of left posterior molar in the maxilla is noted suggesting dental disease. No fracture is noted. No lytic destruction is seen involving the mandible. No lytic destruction is noted. Electronically Signed   By: Lupita Raider M.D.   On: 06/01/2019 14:50   DG Chest 2 View  Result Date: 05/30/2019 CLINICAL DATA:  Chest soreness. Additional provided: Right clavicle and right shoulder pain toward neck. EXAM: CHEST - 2 VIEW COMPARISON:  No pertinent prior studies available for comparison. FINDINGS: Heart size within normal limits. No evidence of airspace consolidation within the lungs. No evidence  of pleural effusion or pneumothorax. No acute bony abnormality. IMPRESSION: No evidence of acute cardiopulmonary abnormality. Electronically Signed   By: Jackey Loge DO   On: 05/30/2019 11:23   CT CHEST W CONTRAST  Result Date: 06/02/2019 CLINICAL DATA:  History of bilateral sternoclavicular joint septic arthritis/osteomyelitis. EXAM: CT CHEST WITH CONTRAST TECHNIQUE: Multidetector CT imaging of the chest was performed during intravenous contrast administration. CONTRAST:  57mL OMNIPAQUE IOHEXOL 300 MG/ML  SOLN COMPARISON:  05/30/2019 FINDINGS: Cardiovascular: The heart size is normal. No substantial pericardial effusion. Mediastinum/Nodes: Abnormal soft tissue posterior to the sternum and sternoclavicular joints, right greater than left, is similar to 05/30/2019 CT chest. No mediastinal lymphadenopathy. There is no hilar lymphadenopathy. The esophagus has normal imaging features. There is no axillary lymphadenopathy. Lungs/Pleura: Minimal collapse/consolidative change noted in the dependent right lower lobe with small bilateral pleural effusions, right greater than left. Upper Abdomen: Unremarkable. Musculoskeletal: No worrisome lytic or sclerotic osseous abnormality.In the interval since that prior CT, there has been asymmetric enlargement of the medial aspect of the right pectoralis major muscle and the right sternocleidomastoid muscle. Although quite subtle, there does appear to be a focal low-attenuation lesion in the right sternocleido mastoid muscle (see axial image 33/3 and coronal image 110/6). This lesion measures 3.1 x 2.6 x 4.2 cm and is suspicious for an intramuscular fluid collection. IMPRESSION: 1. Although quite subtle, there appears to be an intramuscular fluid collection involving the right sternocleidomastoid muscle, new since CT of 05/30/2019. Given the history of sternoclavicular joint infection, intramuscular abscess would be a concern. Given that the finding is so subtle, MRI is  recommended to confirm. Ultrasound may be able to further assess. 2. Tiny bilateral pleural effusions with some minimal collapse/consolidative change in the dependent right lower lobe. 3. Similar appearance of the retrosternal soft tissue attenuation described on the prior CT. Electronically Signed   By: Kennith Center M.D.   On: 06/02/2019 11:45   CT Angio Chest PE W and/or Wo Contrast  Result Date: 05/30/2019 CLINICAL DATA:  Shortness of breath. EXAM: CT ANGIOGRAPHY CHEST WITH CONTRAST TECHNIQUE: Multidetector CT imaging of the chest was performed using the standard protocol during bolus administration of intravenous contrast. Multiplanar CT image reconstructions and MIPs were obtained to evaluate the vascular anatomy. CONTRAST:  OMNIPAQUE IOHEXOL 350 MG/ML SOLN COMPARISON:  None. FINDINGS: Cardiovascular: Satisfactory opacification of the pulmonary arteries to the segmental level. No evidence of pulmonary embolism. Normal heart size. No pericardial effusion. Mediastinum/Nodes: No enlarged mediastinal, hilar, or axillary lymph nodes. Thyroid gland, trachea, and esophagus demonstrate no significant findings. Lungs/Pleura: Lungs are clear. No pleural effusion or pneumothorax. Upper Abdomen: No acute abnormality. Musculoskeletal: There appears to be soft tissue prominence around the right sternoclavicular joint with mild inflammatory changes suggesting inflammation of the joint space or possibly septic arthritis. Alternatively it may represent some form of neoplasm. Review of the MIP images confirms the above findings. IMPRESSION: No definite evidence of pulmonary embolus. Soft tissue prominence and inflammatory changes are noted around the right sternoclavicular joint suggesting possible septic arthritis or other inflammation of the joint space. Alternatively it may represent neoplasm. MRI is recommended for further evaluation. Electronically Signed   By: Lupita Raider M.D.   On: 05/30/2019 16:18   MR  STERNUM W WO CONTRAST  Result Date: 05/31/2019 CLINICAL DATA:  Inflammatory changes right sternoclavicular joint on prior CT chest, concern for septic arthritis EXAM: MRI sternum with bowel and with CONTRAST TECHNIQUE: Multiplanar, multiecho pulse sequences of the sternoclavicular joints before and after the intravenous administration of gadolinium. CONTRAST:  13mL GADAVIST GADOBUTROL 1 MMOL/ML IV SOLN COMPARISON:  05/30/2019 FINDINGS: Bones Evaluation is limited by patient motion throughout the study. There is abnormal edema within the bilateral clavicular heads, left greater  than right. Edema is also seen within the sternal manubrium abutting the sternoclavicular joints, left greater than right. The areas of marrow edema demonstrate enhancement, left more pronounced than right. Joints Hypertrophic changes are seen at the bilateral sternoclavicular joints, right greater than left. There is a small amount of fluid within the right sternoclavicular joint. Other findings There is extensive soft tissue edema surrounding the manubrium and right sternoclavicular joint. The edema extends into the anterior mediastinum to the level of the manubriosternal junction. Edema also involves the right supraclavicular region and inferior aspect of the right sternocleidomastoid muscle. There are rim enhancing fluid collections surrounding the right sternoclavicular joint, extending into the right supraclavicular region and lateral margin of the right sternocleidomastoid muscle, compatible with abscesses. The abscess in the right supraclavicular region/sternocleidomastoid muscle measures 2.7 x 2.1 cm in transverse dimension, and extends approximately 6.3 cm in craniocaudal length. There is a smaller abscess just above the sternal notch measuring 1.2 x 1.3 cm. There is extensive edema in the subcutaneous tissues right supraclavicular region and base of neck. Multiple small reactive lymph nodes are seen in the right supraclavicular  region. IMPRESSION: 1. Findings consistent with septic arthritis and osteomyelitis of the sternoclavicular joints. 2. Multiple abscesses surrounding the right sternoclavicular joint and extending into the right supraclavicular tissues and right sternocleidomastoid muscle as above. Electronically Signed   By: Sharlet Salina M.D.   On: 05/31/2019 02:21   CT TIBIA FIBULA RIGHT W CONTRAST  Result Date: 05/30/2019 CLINICAL DATA:  Right lower extremity pain and swelling. EXAM: CT OF THE LOWER RIGHT EXTREMITY WITH CONTRAST TECHNIQUE: Multidetector CT imaging of the lower right extremity was performed according to the standard protocol following intravenous contrast administration. COMPARISON:  None. CONTRAST:  OMNIPAQUE IOHEXOL 350 MG/ML SOLN FINDINGS: There is a moderate-sized knee joint effusion with typical rim like synovial enhancement. I do not see any destructive bony changes to suggest septic arthritis. There is a fluid collection in the popliteal fossa which may be fluid tracking back along the popliteus tendon. It is not a Baker's cyst. There is also fluid between the gastroc and soleus muscles and fluid tracking down along the medial soleus muscle. Findings could be due to a ruptured cyst. Mild subcutaneous soft tissue swelling/edema but no discrete fluid collection to suggest an abscess. No findings to suggest myofasciitis or pyomyositis. The major venous structures appear patent. I do not see any definite findings for deep venous thrombosis. The bony structures are unremarkable. IMPRESSION: 1. Moderate-sized knee joint effusion with typical rim like synovial enhancement. I do not see any destructive cartilage or bony changes to suggest septic arthritis. 2. Fluid collection in the popliteal fossa may be fluid tracking back along the popliteus tendon. It is not a Baker's cyst. 3. Fluid tracking down along the medial soleus muscle and between the gastroc and soleus muscles. Findings could be due to a  ruptured cyst. 4. No findings to suggest myofasciitis or pyomyositis. 5. Patent major venous structures. Electronically Signed   By: Rudie Meyer M.D.   On: 05/30/2019 16:09   MR TIBIA FIBULA RIGHT W WO CONTRAST  Result Date: 05/31/2019 CLINICAL DATA:  Soft tissue infection, query compartment syndrome. EXAM: MRI OF LOWER RIGHT EXTREMITY WITHOUT AND WITH CONTRAST TECHNIQUE: Multiplanar, multisequence MR imaging of the right tibia/fibula was performed both before and after administration of intravenous contrast. CONTRAST:  10mL GADAVIST GADOBUTROL 1 MMOL/ML IV SOLN COMPARISON:  CT scan 05/30/2019 FINDINGS: Despite efforts by the technologist and patient, motion artifact  is present on today's exam and could not be eliminated. This reduces exam sensitivity and specificity. Bones/Joint/Cartilage Knee effusion on the right. Possibly ruptured right Baker's cyst. No significant degree of subcortical marrow edema along the knee. No findings of osteomyelitis in the tibia/fibula. Fairly thin synovial head enhancement along the knee effusion, the degree of synovial enhancement would be unusually small for septic joint. Ligaments N/A Muscles and Tendons There is accentuated edema and enhancement proximally in the anterior compartment of the lower leg including within along the tibialis anterior and extensor digitorum longus. There is also abnormal infiltrative edema and enhancement within along parts of the posterior compartment including the tibialis posterior, flexor digitorum longus, proximal soleus muscle, the anterior portion of medial head gastrocnemius, and a small portion of the anteromedial portion of the lateral head gastrocnemius. Edema and enhancement within along the popliteus muscle. Abnormal edema and enhancement tracks along the adjacent fascia planes in the posterior compartment, particularly between the soleus and gastrocnemius musculature and along the superficial fascia margin of the left calf where  there is a fluid collection posteromedially measuring about 0.5 by 3.8 by 9.5 cm (volume = 9 cm^3), partially shown on image 59/29. This only has a thin rim of enhancement and is nonspecific for abscess. I not observed gas tracking along musculature or fascia planes. Plantaris muscle poorly seen. Soft tissues Subcutaneous edema is primarily anterior in the proximal portion of the lower leg but extends more circumferentially distally, although maintains and anteromedial confluence. IMPRESSION: 1. Abnormal edema within muscle groups and along fascia planes with associated enhancement especially proximally in the anterior and posterior compartments of the lower leg. Myositis with fasciitis is suspected. The lower portions of the compartments are not as involved. We do not demonstrate diffuse muscular or diffuse compartmental accentuated edema to further indicate risk for compartment syndrome although compartmental pressures may still warranted if clinical indicators of compartment syndrome are present. Overlying cellulitis noted. Small fluid collection tracking along the superficial fascia margin of the posterior compartment posteromedially, although without the thick enhancing margin characteristic of an abscess. 2. Knee effusion with only minimal synovial enhancement and no findings of abnormal osseous activity to suggest osteomyelitis. In general with this minimal degree of synovial enhancement I am skeptical of septic joint. 3. No osteomyelitis. Electronically Signed   By: Gaylyn Rong M.D.   On: 05/31/2019 09:42   DG Chest Port 1 View  Result Date: 06/01/2019 CLINICAL DATA:  Chest pain, sepsis, lower extremity swelling EXAM: PORTABLE CHEST 1 VIEW COMPARISON:  05/30/2019 chest radiograph. FINDINGS: Stable cardiomediastinal silhouette with normal heart size. No pneumothorax. No pleural effusion. Lungs appear clear, with no acute consolidative airspace disease and no pulmonary edema. IMPRESSION: No active  disease. Electronically Signed   By: Delbert Phenix M.D.   On: 06/01/2019 10:37   ECHOCARDIOGRAM COMPLETE  Result Date: 06/01/2019    ECHOCARDIOGRAM REPORT   Patient Name:   Herbert Walsh Date of Exam: 06/01/2019 Medical Rec #:  [id number]   Height:       72.0 in Accession #:    [id number]  Weight:       280.0 lb Date of Birth:  August 26, 1990   BSA:          2.458 m Patient Age:    29 years    BP:           127/69 mmHg Patient Gender: M           HR:  109 bpm. Exam Location:  Inpatient Procedure: 2D Echo, Color Doppler and Cardiac Doppler Indications:    Bactermia 790.7/R78.81  History:        Patient has no prior history of Echocardiogram examinations.                 Risk Factors:Hypertension and Dyslipidemia.  Sonographer:    Ross Ludwig RDCS (AE) Referring Phys: [id number] Belia Heman COMER  Sonographer Comments: Patient is morbidly obese. IMPRESSIONS  1. Left ventricular ejection fraction, by estimation, is 60 to 65%. The left ventricle has normal function. The left ventricle has no regional wall motion abnormalities. There is mild left ventricular hypertrophy. Left ventricular diastolic parameters were normal.  2. Right ventricular systolic function is normal. The right ventricular size is normal. Tricuspid regurgitation signal is inadequate for assessing PA pressure.  3. The mitral valve is normal in structure. No evidence of mitral valve regurgitation.  4. The aortic valve was not well visualized. Aortic valve regurgitation is not visualized. No aortic stenosis is present.  5. The inferior vena cava is normal in size with greater than 50% respiratory variability, suggesting right atrial pressure of 3 mmHg.  6. No vegetation seen, though technically difficult study. If clinical suspicion for endocarditis, consider TEE FINDINGS  Left Ventricle: Left ventricular ejection fraction, by estimation, is 60 to 65%. The left ventricle has normal function. The left ventricle has no regional wall motion abnormalities. The  left ventricular internal cavity size was normal in size. There is  mild left ventricular hypertrophy. Left ventricular diastolic parameters were normal. Right Ventricle: The right ventricular size is normal. No increase in right ventricular wall thickness. Right ventricular systolic function is normal. Tricuspid regurgitation signal is inadequate for assessing PA pressure. Left Atrium: Left atrial size was normal in size. Right Atrium: Right atrial size was normal in size. Pericardium: Trivial pericardial effusion is present. Mitral Valve: The mitral valve is normal in structure. No evidence of mitral valve regurgitation. MV peak gradient, 8.5 mmHg. The mean mitral valve gradient is 3.0 mmHg. Tricuspid Valve: The tricuspid valve is normal in structure. Tricuspid valve regurgitation is not demonstrated. Aortic Valve: The aortic valve was not well visualized. Aortic valve regurgitation is not visualized. No aortic stenosis is present. Aortic valve mean gradient measures 6.0 mmHg. Aortic valve peak gradient measures 10.8 mmHg. Aortic valve area, by VTI measures 2.54 cm. Pulmonic Valve: The pulmonic valve was not well visualized. Pulmonic valve regurgitation is not visualized. Aorta: The aortic root and ascending aorta are structurally normal, with no evidence of dilitation. Venous: The inferior vena cava is normal in size with greater than 50% respiratory variability, suggesting right atrial pressure of 3 mmHg. IAS/Shunts: The interatrial septum was not well visualized.  LEFT VENTRICLE PLAX 2D LVIDd:         4.70 cm  Diastology LVIDs:         3.20 cm  LV e' lateral: 8.58 cm/s LV PW:         1.50 cm  LV e' medial:  15.00 cm/s LV IVS:        1.60 cm LVOT diam:     2.30 cm LV SV:         69 LV SV Index:   28 LVOT Area:     4.15 cm  RIGHT VENTRICLE             IVC RV Basal diam:  2.50 cm     IVC diam: 1.50 cm RV S prime:  17.30 cm/s TAPSE (M-mode): 2.4 cm LEFT ATRIUM           Index       RIGHT ATRIUM           Index  LA diam:      2.60 cm 1.06 cm/m  RA Area:     11.50 cm LA Vol (A2C): 31.7 ml 12.90 ml/m RA Volume:   23.80 ml  9.68 ml/m LA Vol (A4C): 62.0 ml 25.22 ml/m  AORTIC VALVE AV Area (Vmax):    2.43 cm AV Area (Vmean):   2.14 cm AV Area (VTI):     2.54 cm AV Vmax:           164.00 cm/s AV Vmean:          114.000 cm/s AV VTI:            0.270 m AV Peak Grad:      10.8 mmHg AV Mean Grad:      6.0 mmHg LVOT Vmax:         95.90 cm/s LVOT Vmean:        58.800 cm/s LVOT VTI:          0.165 m LVOT/AV VTI ratio: 0.61  AORTA Ao Root diam: 3.60 cm Ao Asc diam:  3.10 cm MITRAL VALVE MV Area (PHT): 4.89 cm  SHUNTS MV Peak grad:  8.5 mmHg  Systemic VTI:  0.16 m MV Mean grad:  3.0 mmHg  Systemic Diam: 2.30 cm MV Vmax:       1.46 m/s MV Vmean:      71.1 cm/s Epifanio Lesches MD Electronically signed by Epifanio Lesches MD Signature Date/Time: 06/01/2019/8:55:46 PM    Final    VAS [place] LOWER EXTREMITY VENOUS (DVT) (ONLY [facility] & [facility] 7a-7p)  Result Date: 05/30/2019  Lower Venous DVTStudy Indications: Pain, and Erythema.  Comparison Study: no prior Performing Technologist: Jeb Levering RDMS, RVT  Examination Guidelines: A complete evaluation includes B-mode imaging, spectral Doppler, color Doppler, and power Doppler as needed of all accessible portions of each vessel. Bilateral testing is considered an integral part of a complete examination. Limited examinations for reoccurring indications may be performed as noted. The reflux portion of the exam is performed with the patient in reverse Trendelenburg.  +---------+---------------+---------+-----------+----------+--------------+ RIGHT    CompressibilityPhasicitySpontaneityProperties               +---------+---------------+---------+-----------+----------+--------------+ CFV                                                   Not visualized +---------+---------------+---------+-----------+----------+--------------+ FV Prox  Full           Yes      Yes                                  +---------+---------------+---------+-----------+----------+--------------+ FV Mid   Full                                                        +---------+---------------+---------+-----------+----------+--------------+ FV DistalFull                                                        +---------+---------------+---------+-----------+----------+--------------+  PFV      Full                                                        +---------+---------------+---------+-----------+----------+--------------+ POP      Full           Yes      Yes                                 +---------+---------------+---------+-----------+----------+--------------+ PTV      Full                                                        +---------+---------------+---------+-----------+----------+--------------+ PERO     Full                                                        +---------+---------------+---------+-----------+----------+--------------+ Limited due to clothing interference  +----+---------------+---------+-----------+----------+--------------+ LEFTCompressibilityPhasicitySpontaneityProperties               +----+---------------+---------+-----------+----------+--------------+ CFV                                              Not visualized +----+---------------+---------+-----------+----------+--------------+     Summary: RIGHT: - There is no evidence of deep vein thrombosis in the lower extremity. However, portions of this examination were limited- see technologist comments above.  - No cystic structure found in the popliteal fossa. - Interstitial fluid noted in mid calf.   *See table(s) above for measurements and observations. Electronically signed by Sherald Hess MD on 05/30/2019 at 4:05:18 PM.    Final     Labs:  CBC: Recent Labs    05/30/19 1050 05/31/19 0318 06/01/19 0629 06/02/19 0349  WBC 19.8* 17.2* 15.9* 13.6*  HGB  12.7* 11.7* 10.3* 10.2*  HCT 40.8 36.8* 32.5* 32.5*  PLT 377 354 359 410*    COAGS: Recent Labs    05/30/19 1520  INR 1.1  APTT 36    BMP: Recent Labs    08/06/18 1522 08/06/18 1522 12/16/18 1028 05/30/19 1050 05/31/19 0318 06/01/19 0629  NA 135   < > 138 128* 132* 134*  K 4.1   < > 4.4 4.1 4.3 4.0  CL 103   < > 104 94* 100 105  CO2 27   < > 25 20* 21* 19*  GLUCOSE 116   < > 181* 268* 194* 184*  BUN 7   < > CALCIUM 9.5   < > 9.4 9.3 8.6* 8.4*  CREATININE 0.92   < > 0.98 0.95 0.79 0.67  GFRNONAA 111  --   --  >60 >60 >60  GFRAA 129  --   --  >60 >60 >60   < > = values in this interval not displayed.    LIVER FUNCTION TESTS:  Recent Labs    05/31/19 0318  BILITOT 1.2  AST 29  ALT 22  ALKPHOS 85  PROT 6.2*  ALBUMIN 2.3*    TUMOR MARKERS: No results for input(s): AFPTM, CEA, CA199, CHROMGRNA in the last 8760 hours.  Assessment and Plan:  29 y/o M admitted on 05/30/19 after RLE pain and right shoulder pain x 3 days, he underwent arthrotomy with washout of right knee effusion on 4/17 and culture of this fluid was found to be (+) for MSSA. Imaging also showed findings consistent with septic arthritis and osteomyelitis of the sternoclavicular joints as well as multiple abscess surrounding the right sternoclavicular joint and extending into the right sternocleidomastoid muscle. He has been managed conservatively by CT surgery however follow up imaging today shows an intramuscular fluid collection within the right sternocleidomastoid muscle which was not present on earlier imaging - IR has been asked to perform an aspiration of this fluid collection to send for culture. Patient history and imaging has been reviewed today by Dr. Grace Isaac who approves procedure.  Will plan for CT/US guided aspiration of the right sternocleidomastoid fluid collection tomorrow (4/20) with moderate sedation -- patient to be NPO at midnight, hold Lovenox tonight, CBC/INR ordered for  AM.  Risks and benefits discussed with the patient including bleeding, infection, damage to adjacent structures and sepsis.  All of the patient's questions were answered, patient is agreeable to proceed.  Consent signed and in chart.  Thank you for this interesting consult.  I greatly enjoyed meeting Herbert Walsh and look forward to participating in their care.  A copy of this report was sent to the requesting provider on this date.  Electronically Signed: Villa Herb, PA-C 06/02/2019, 4:02 PM   I spent a total of 40 Minutes  in face to face in clinical consultation, greater than 50% of which was counseling/coordinating care for right sternocleidomastoid fluid collection aspiration.

## 2019-06-02 NOTE — Progress Notes (Addendum)
2 Days Post-Op Procedure(s) (LRB): IRRIGATION AND DEBRIDEMENT LOWER EXTREMITY (Right) Subjective: more induration and pain over R sterno-clavicular joint WBC , fever improvedcreat normal Will obtain f/u CT chest with contrast Echo - no evidence endocarditis, LV fx normal Objective: Vital signs in last 24 hours: Temp:  [98.2 F (36.8 C)-99.1 F (37.3 C)] 98.9 F (37.2 C) (04/19 0804) Pulse Rate:  [102-126] 117 (04/19 0804) Cardiac Rhythm: Sinus tachycardia (04/18 2045) Resp:  [17-20] 19 (04/19 0804) BP: (138-161)/(65-94) 140/83 (04/19 0804) SpO2:  [90 %-99 %] 97 % (04/19 0804)  Hemodynamic parameters for last 24 hours:    Intake/Output from previous day: 04/18 0701 - 04/19 0700 In: 1480 [P.O.:1480] Out: 1800 [Urine:1800] Intake/Output this shift: Total I/O In: -  Out: 850 [Urine:850]  EXAM Erythema, tenderness,induration R [place] joint R knee immobilized  Lab Results: Recent Labs    06/01/19 0629 06/02/19 0349  WBC 15.9* 13.6*  HGB 10.3* 10.2*  HCT 32.5* 32.5*  PLT 359 410*   BMET:  Recent Labs    05/31/19 0318 06/01/19 0629  NA 132* 134*  K 4.3 4.0  CL 100 105  CO2 21* 19*  GLUCOSE 194* 184*  BUN 8 8  CREATININE 0.79 0.67  CALCIUM 8.6* 8.4*    PT/INR:  Recent Labs    05/30/19 1520  LABPROT 14.2  INR 1.1   ABG No results found for: PHART, HCO3, TCO2, ACIDBASEDEF, O2SAT CBG (last 3)  Recent Labs    06/01/19 1550 06/01/19 2036 06/02/19 0750  GLUCAP 166* 148* 150*    Assessment/Plan: S/P Procedure(s) (LRB): IRRIGATION AND DEBRIDEMENT LOWER EXTREMITY (Right) Repeat CT chest   LOS: 3 days    Herbert Walsh 06/02/2019  Follow-up CT scan shows no abscess in the right sternoclavicular joint but there may be a fluid collection beneath the sternocleidomastoid.  Will ask IR to consider ultrasound-guided aspiration of this fluid collection with fluid sent for culture  Lovett Sox MD

## 2019-06-02 NOTE — Progress Notes (Signed)
Subjective: 2 Days Post-Op Procedure(s) (LRB): IRRIGATION AND DEBRIDEMENT LOWER EXTREMITY (Right) Patient reports pain as moderate.  Still in moderate pain to RLE.  WBC continuing to trend down.   Objective: Vital signs in last 24 hours: Temp:  [98.2 F (36.8 C)-99.1 F (37.3 C)] 98.9 F (37.2 C) (04/19 0804) Pulse Rate:  [102-126] 117 (04/19 0804) Resp:  [17-20] 19 (04/19 0804) BP: (138-161)/(65-94) 140/83 (04/19 0804) SpO2:  [90 %-99 %] 97 % (04/19 0804)  Intake/Output from previous day: 04/18 0701 - 04/19 0700 In: 1480 [P.O.:1480] Out: 1800 [Urine:1800] Intake/Output this shift: Total I/O In: -  Out: 850 [Urine:850]  Recent Labs    05/30/19 1050 05/31/19 0318 06/01/19 0629 06/02/19 0349  HGB 12.7* 11.7* 10.3* 10.2*   Recent Labs    06/01/19 0629 06/02/19 0349  WBC 15.9* 13.6*  RBC 4.12* 4.16*  HCT 32.5* 32.5*  PLT 359 410*   Recent Labs    05/31/19 0318 06/01/19 0629  NA 132* 134*  K 4.3 4.0  CL 100 105  CO2 21* 19*  BUN 8 8  CREATININE 0.79 0.67  GLUCOSE 194* 184*  CALCIUM 8.6* 8.4*   Recent Labs    05/30/19 1520  INR 1.1    Neurologically intact Neurovascular intact Sensation intact distally Intact pulses distally Dorsiflexion/Plantar flexion intact Incision: dressing C/D/I Compartment soft   Assessment/Plan: 2 Days Post-Op Procedure(s) (LRB): IRRIGATION AND DEBRIDEMENT LOWER EXTREMITY (Right) Up with therapy WBAT RLE ROM as tolerated right knee Intra-op cx MSSA Continue IV abx and plan per ID WBC trending down F/u with Dr. Roda Shutters 2 weeks post-op      Cristie Hem 06/02/2019, 9:43 AM

## 2019-06-02 NOTE — Progress Notes (Addendum)
   Subjective:  Herbert Walsh is a 29 y.o. M with PMH of RA, HTN, HLD, PTSD admit for MSSA bacteremia on hospital day 3  Herbert Walsh was examined and evaluated at bedside this am. He mentions that his pain is poorly controlled as his pain med only last half an hour and his med is scheduled for every 4 hours. He mentions that his wound vac may not be functioning properly.   Objective:  Vital signs in last 24 hours: Vitals:   06/01/19 1811 06/01/19 2034 06/01/19 2359 06/02/19 0405  BP: (!) 150/93 (!) 161/94 138/77 (!) 147/65  Pulse: (!) 115 (!) 126 (!) 106 (!) 102  Resp: 18 18 17 19   Temp: 98.2 F (36.8 C) 98.5 F (36.9 C) 98.5 F (36.9 C) 99.1 F (37.3 C)  TempSrc: Oral Oral Oral Oral  SpO2: 99% 97% 94% 90%  Weight:      Height:        General: NAD, nl appearance HEENT: ROM intact, edema around right clavicle, antiicteric sclera  Cardiovascular: Normal rate, regular rhythm.  No murmurs, rubs, or gallops Pulmonary : Effort normal, breath sounds normal. No wheezes, rales, or rhonchi Abdominal: soft, nontender,  bowel sounds present Musculoskeletal: no swelling , deformity, injury ,or tenderness in extremities, Skin:erythema over R. Clavicle , Right leg wrapped with wound vac in place Psychiatric/Behavioral:  normal mood, normal behavior    CBC Latest Ref Rng & Units 06/02/2019 06/01/2019 05/31/2019  WBC 4.0 - 10.5 K/uL 13.6(H) 15.9(H) 17.2(H)  Hemoglobin 13.0 - 17.0 g/dL 10.2(L) 10.3(L) 11.7(L)  Hematocrit 39.0 - 52.0 % 32.5(L) 32.5(L) 36.8(L)  Platelets 150 - 400 K/uL 410(H) 359 354   BMP Latest Ref Rng & Units 06/01/2019 05/31/2019 05/30/2019  Glucose 70 - 99 mg/dL 06/01/2019) 194(R) 740(C)  BUN 6 - 20 mg/dL 8 8 8   Creatinine 0.61 - 1.24 mg/dL 144(Y 1.85  BUN/Creat Ratio 6 - 22 (calc) - - -  Sodium 135 - 145 mmol/L 134(L) 132(L) 128(L)  Potassium 3.5 - 5.1 mmol/L 4.0 4.3 4.1  Chloride 98 - 111 mmol/L 105 100 94(L)  CO2 22 - 32 mmol/L 19(L) 21(L) 20(L)  Calcium 8.9 - 10.3 mg/dL  6.31) 4.97) 9.3    Assessment/Plan:  Principal Problem:   MSSA bacteremia Active Problems:   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 M with PMH of RA, HTN, HLD, PTSD admit for MSSA bacteremia with septic arthritis.  MSSA Bacteremia R Knee septic arthritis RLE Myositis / Fascitis Sternoclavicular Septic Arthritis/Osteomyelitis Disseminated MSSA infection with multiple joint involvement. S/p R knee washout. CT surg recommending medical management for sternoclavicular joint infection. ID on board. Currently on cefazolin. Wbc 19.8->17.2->15.9>13.6. No signs of endocarditis on TTE. Repeat blood cx on 4/18 NGTD. Afebrile overnight. Vitals stable. - Appreciate ortho, ct surg, ID recs - C/w Cefazolin (Day 4 of abx) - F/u blood cultures - Trend cbc - Monitor vitals, new joint involvements - Percocet PRN for moderate pain and Dilaudid PRN for severe pain - Consult Cards for TEE  PTSD/Intermittent Explosive Disorder C/w home meds: citalopram 40mg  daily, lithium 450mg /300mg  BID, Risperdal 4mg  qhs, Sertraline 50mg  daily  DVT prophx: Lovenox Diet: Cm Bowel: Miralax Code: Full  Dispo: Anticipated discharge in approximately 11-14 day(s).   5/18, MD PGY1

## 2019-06-03 DIAGNOSIS — M609 Myositis, unspecified: Secondary | ICD-10-CM

## 2019-06-03 LAB — CBC
HCT: 35.3 % — ABNORMAL LOW (ref 39.0–52.0)
Hemoglobin: 11.1 g/dL — ABNORMAL LOW (ref 13.0–17.0)
MCH: 24.4 pg — ABNORMAL LOW (ref 26.0–34.0)
MCHC: 31.4 g/dL (ref 30.0–36.0)
MCV: 77.6 fL — ABNORMAL LOW (ref 80.0–100.0)
Platelets: 434 10*3/uL — ABNORMAL HIGH (ref 150–400)
RBC: 4.55 MIL/uL (ref 4.22–5.81)
RDW: 16.8 % — ABNORMAL HIGH (ref 11.5–15.5)
WBC: 15.5 10*3/uL — ABNORMAL HIGH (ref 4.0–10.5)
nRBC: 0 % (ref 0.0–0.2)

## 2019-06-03 LAB — PROTIME-INR
INR: 1.2 (ref 0.8–1.2)
Prothrombin Time: 14.8 seconds (ref 11.4–15.2)

## 2019-06-03 LAB — GLUCOSE, CAPILLARY
Glucose-Capillary: 139 mg/dL — ABNORMAL HIGH (ref 70–99)
Glucose-Capillary: 125 mg/dL — ABNORMAL HIGH (ref 70–99)
Glucose-Capillary: 104 mg/dL — ABNORMAL HIGH (ref 70–99)
Glucose-Capillary: 163 mg/dL — ABNORMAL HIGH (ref 70–99)

## 2019-06-03 NOTE — Progress Notes (Signed)
   Subjective:  Herbert Walsh is a 29 y.o. M with PMH of RA, HTN, HLD, PTSD admit for MSSA bacteremia on hospital day 4  Herbert Walsh was examined and evaluated at bedside this am. He states that he is still very sore but pain is tolerable. Discussed plan to go down to IR for aspiration. Herbert Walsh expressed understanding.  Objective:  Vital signs in last 24 hours: Vitals:   06/02/19 1448 06/02/19 2141 06/02/19 2152 06/03/19 0511  BP: 128/80 132/78 (!) 141/89 123/64  Pulse: (!) 102 100 98 98  Resp: 18 19 16 18   Temp: 98.7 F (37.1 C) 98.5 F (36.9 C) 98.6 F (37 C) 98.2 F (36.8 C)  TempSrc: Oral Oral Oral Oral  SpO2: 96% 98% 95% 92%  Weight:      Height:        General: NAD, nl appearance HEENT: ROM intact, edema around right clavicle, antiicteric sclera  Cardiovascular: Normal rate, regular rhythm.  No murmurs, rubs, or gallops Pulmonary : Effort normal, breath sounds normal. No wheezes, rales, or rhonchi Abdominal: soft, nontender,  bowel sounds present Musculoskeletal: no swelling , deformity, injury ,or tenderness in extremities, Skin:erythema over R. Clavicle , Right leg wrapped with wound vac in place Psychiatric/Behavioral:  normal mood, normal behavior    CBC Latest Ref Rng & Units 06/03/2019 06/02/2019 06/01/2019  WBC 4.0 - 10.5 K/uL 15.5(H) 13.6(H) 15.9(H)  Hemoglobin 13.0 - 17.0 g/dL 11.1(L) 10.2(L) 10.3(L)  Hematocrit 39.0 - 52.0 % 35.3(L) 32.5(L) 32.5(L)  Platelets 150 - 400 K/uL 434(H) 410(H) 359   BMP Latest Ref Rng & Units 06/01/2019 05/31/2019 05/30/2019  Glucose 70 - 99 mg/dL 06/01/2019) 409(W) 119(J)  BUN 6 - 20 mg/dL 8 8 8   Creatinine 0.61 - 1.24 mg/dL 478(G 9.56  BUN/Creat Ratio 6 - 22 (calc) - - -  Sodium 135 - 145 mmol/L 134(L) 132(L) 128(L)  Potassium 3.5 - 5.1 mmol/L 4.0 4.3 4.1  Chloride 98 - 111 mmol/L 105 100 94(L)  CO2 22 - 32 mmol/L 19(L) 21(L) 20(L)  Calcium 8.9 - 10.3 mg/dL 2.13) 0.86) 9.3    Assessment/Plan:  Principal Problem:   MSSA  bacteremia Active Problems:   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 M with PMH of RA, HTN, HLD, PTSD admit for MSSA bacteremia with septic arthritis.  MSSA Bacteremia R Knee septic arthritis RLE Myositis / Fascitis Sternoclavicular Septic Arthritis/Osteomyelitis Disseminated MSSA infection with multiple joint involvement. S/p R knee washout. CT surg recommending medical management for sternoclavicular joint infection. ID on board. Currently on cefazolin. Wbc trending down until today, trend up today likely not significant. No signs of endocarditis on TTE. Plan for TEE on Thursday. Repeat blood cx on 4/18 NGTD. Afebrile overnight. Vitals stable. - Appreciate ortho, ct surg, ID recs, IR - IR for [place] joint aspiration - C/w Cefazolin (Day 5 of abx) - F/u blood cultures - Trend cbc - Monitor vitals, new joint involvements - Percocet PRN for moderate pain and Dilaudid PRN for severe pain   PTSD/Intermittent Explosive Disorder C/w home meds: citalopram 40mg  daily, lithium 450mg /300mg  BID, Risperdal 4mg  qhs, Sertraline 50mg  daily  DVT prophx: Lovenox Diet: Cm Bowel: Miralax Code: Full  Dispo: Anticipated discharge in approximately 11-14 day(s).   5/18, MD PGY1

## 2019-06-04 ENCOUNTER — Inpatient Hospital Stay (HOSPITAL_COMMUNITY): Payer: BC Managed Care – PPO

## 2019-06-04 LAB — BASIC METABOLIC PANEL
Anion gap: 11 (ref 5–15)
BUN: 7 mg/dL (ref 6–20)
CO2: 23 mmol/L (ref 22–32)
Calcium: 8.6 mg/dL — ABNORMAL LOW (ref 8.9–10.3)
Chloride: 103 mmol/L (ref 98–111)
Creatinine, Ser: 0.7 mg/dL (ref 0.61–1.24)
GFR calc Af Amer: >60 mL/min (ref >60)
GFR calc non Af Amer: >60 mL/min (ref >60)
Glucose, Bld: 141 mg/dL — ABNORMAL HIGH (ref 70–99)
Potassium: 4 mmol/L (ref 3.5–5.1)
Sodium: 137 mmol/L (ref 135–145)

## 2019-06-04 LAB — CBC
HCT: 36.2 % — ABNORMAL LOW (ref 39.0–52.0)
Hemoglobin: 11.4 g/dL — ABNORMAL LOW (ref 13.0–17.0)
MCH: 24.4 pg — ABNORMAL LOW (ref 26.0–34.0)
MCHC: 31.5 g/dL (ref 30.0–36.0)
MCV: 77.5 fL — ABNORMAL LOW (ref 80.0–100.0)
Platelets: 492 10*3/uL — ABNORMAL HIGH (ref 150–400)
RBC: 4.67 MIL/uL (ref 4.22–5.81)
RDW: 16.6 % — ABNORMAL HIGH (ref 11.5–15.5)
WBC: 16.7 10*3/uL — ABNORMAL HIGH (ref 4.0–10.5)
nRBC: 0.1 % (ref 0.0–0.2)

## 2019-06-04 LAB — CULTURE, BLOOD (ROUTINE X 2): Culture: NO GROWTH

## 2019-06-04 LAB — GLUCOSE, CAPILLARY
Glucose-Capillary: 145 mg/dL — ABNORMAL HIGH (ref 70–99)
Glucose-Capillary: 165 mg/dL — ABNORMAL HIGH (ref 70–99)
Glucose-Capillary: 141 mg/dL — ABNORMAL HIGH (ref 70–99)
Glucose-Capillary: 160 mg/dL — ABNORMAL HIGH (ref 70–99)

## 2019-06-04 IMAGING — CT CT GUIDANCE NEEDLE PLACEMENT
1 of 2 series · 14 of 32 positions shown, 19 images · non-contrast
Comparison: none

CLINICAL DATA: Intramuscular fluid collection, right
sternocleidomastoid muscle.

[Series 2: i-spiral 5.0 b40f · axial · 0.75mm/px · z∈[-456,-333]mm · 14 of 41 slices shown, 19 images]
[im 3/41  soft-tissue]
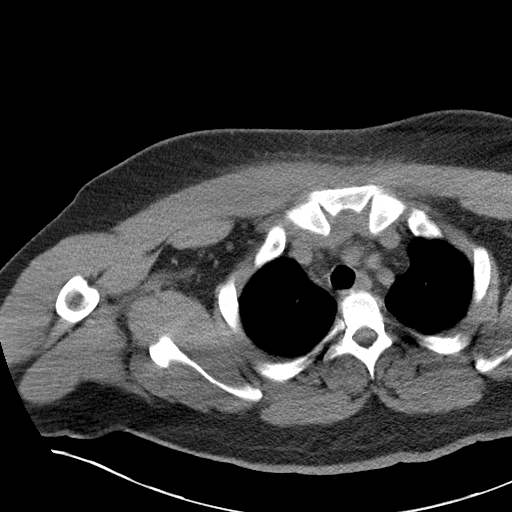
[im 3/41  bone]
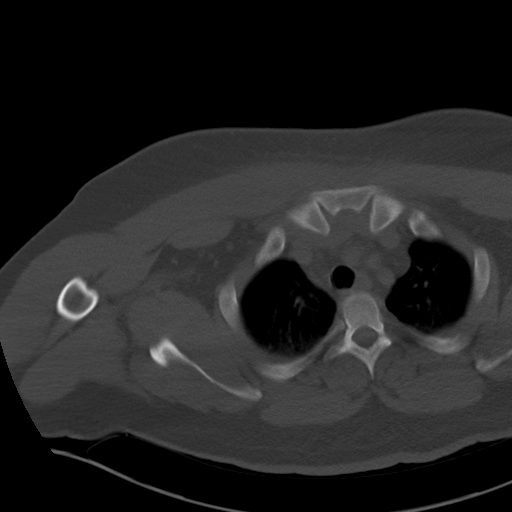
[im 5/41  soft-tissue]
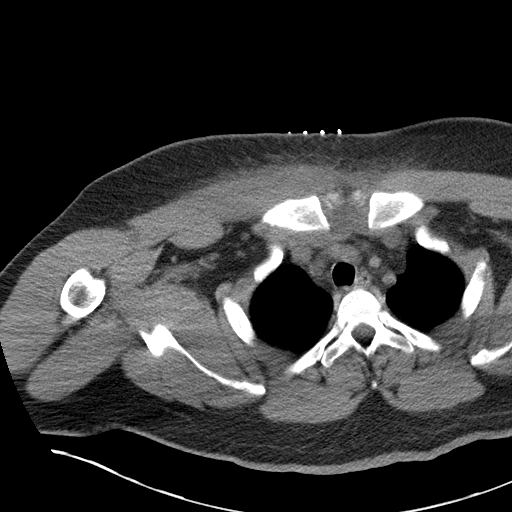
[im 10/41  soft-tissue]
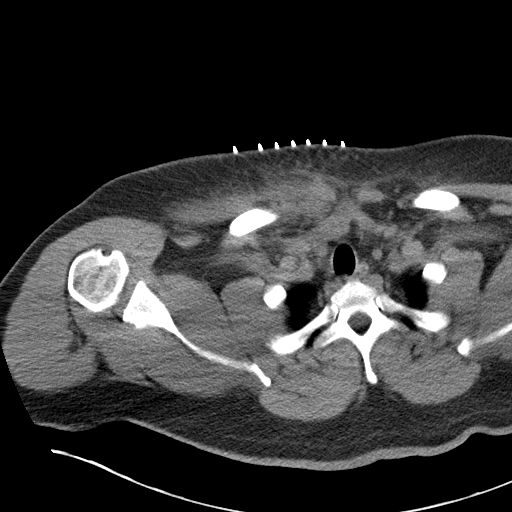
[im 12/41  soft-tissue]
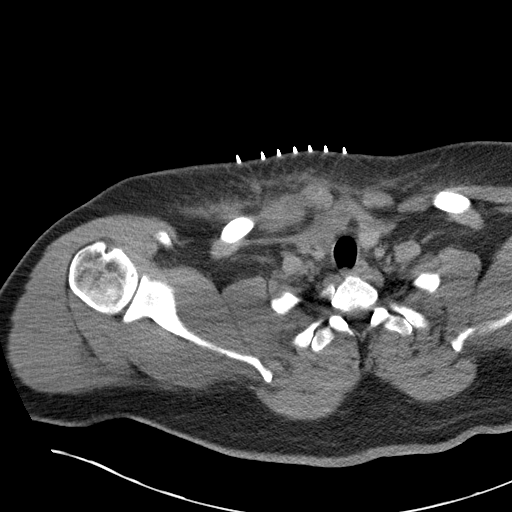
[im 15/41  soft-tissue]
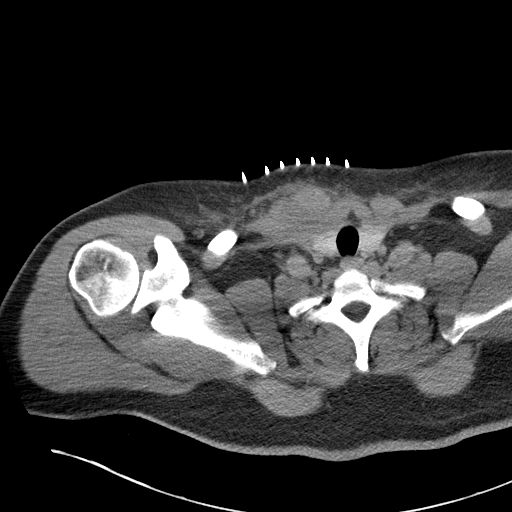
[im 17/41  soft-tissue]
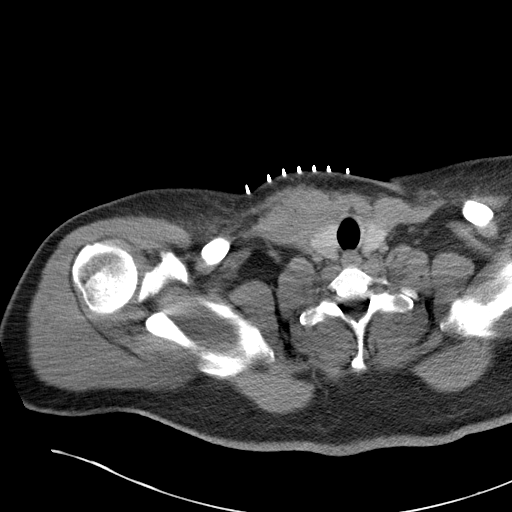
[im 22/41  soft-tissue]
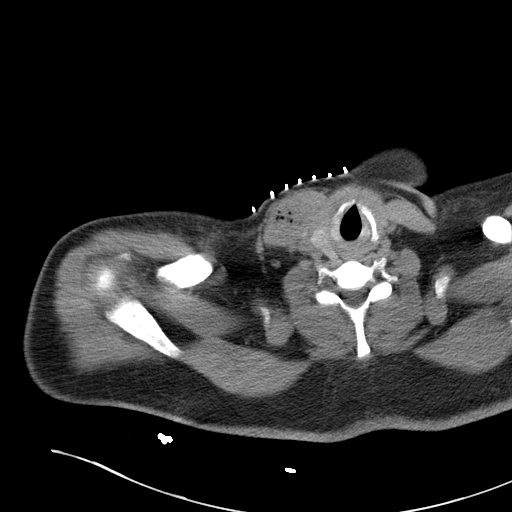
[im 24/41  soft-tissue]
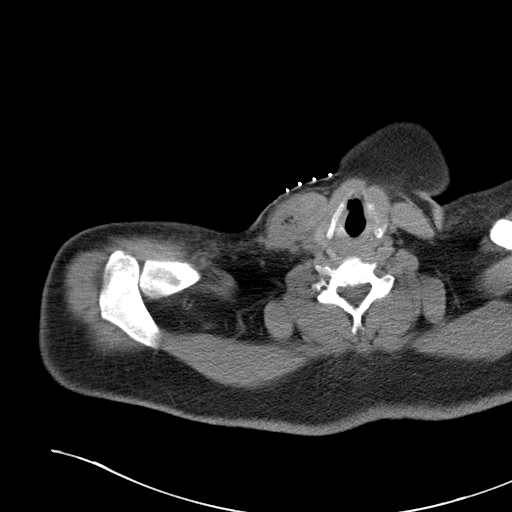
[im 26/41  soft-tissue]
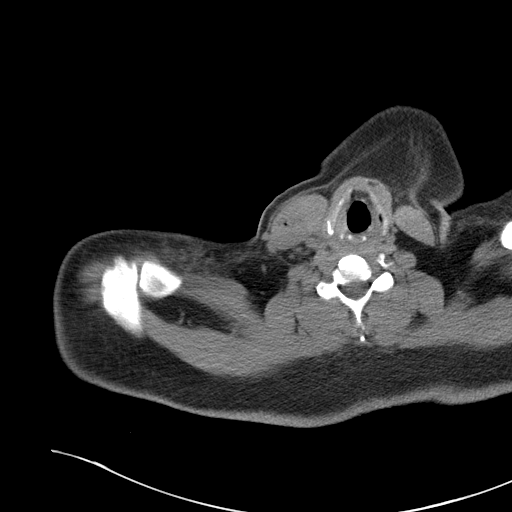
[im 26/41  bone]
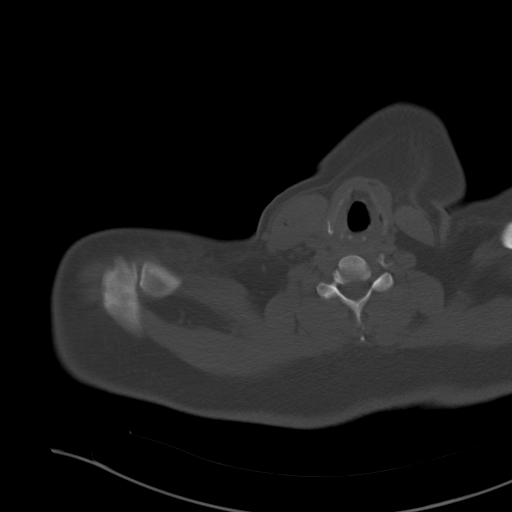
[im 29/41  soft-tissue]
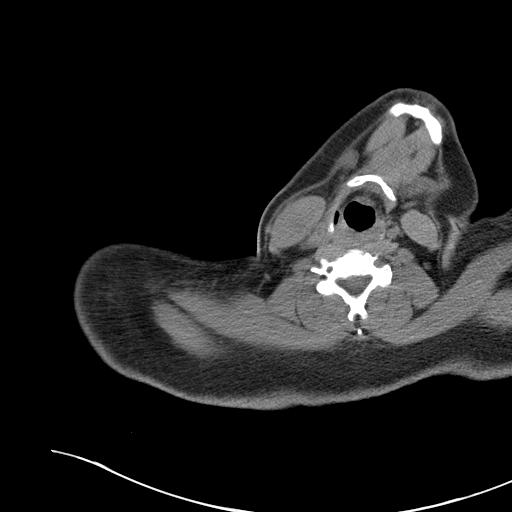
[im 31/41  soft-tissue]
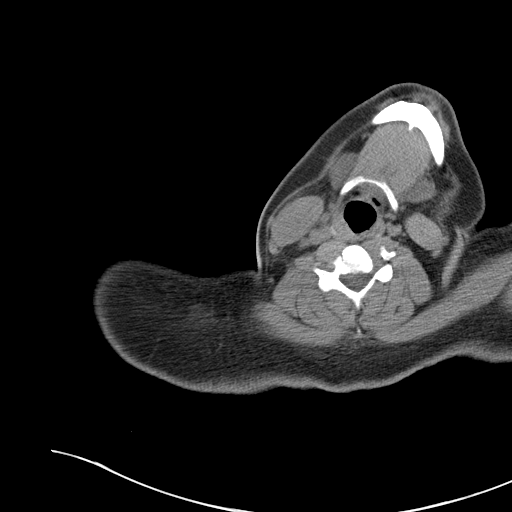
[im 31/41  lung]
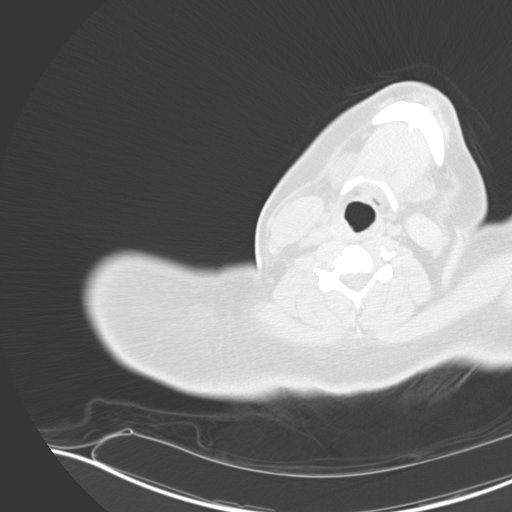
[im 33/41  lung]
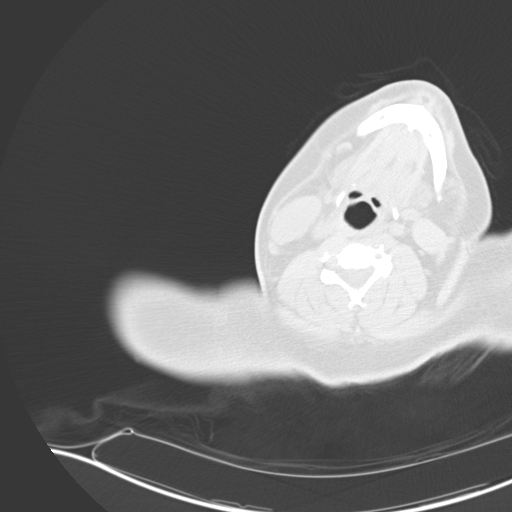
[im 36/41  soft-tissue]
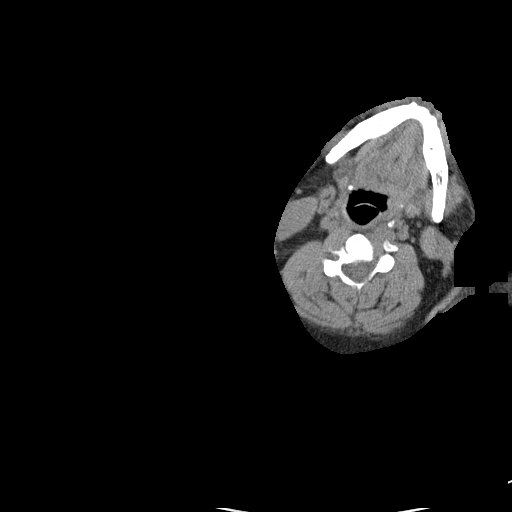
[im 36/41  lung]
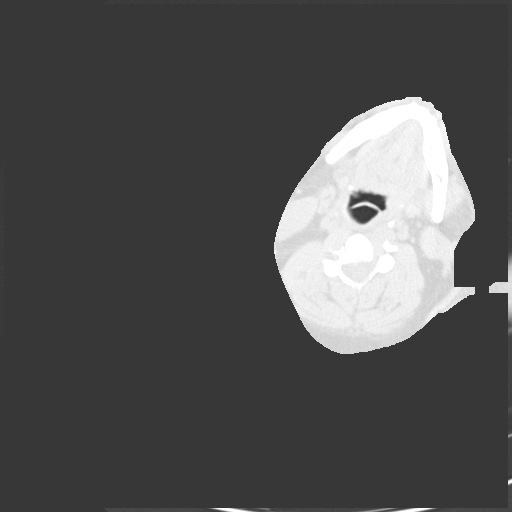
[im 38/41  soft-tissue]
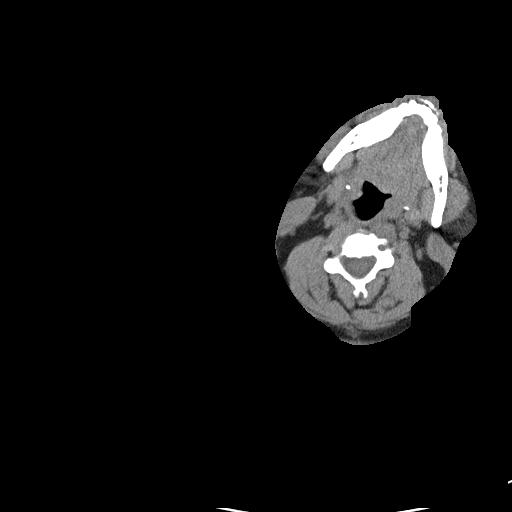
[im 38/41  lung]
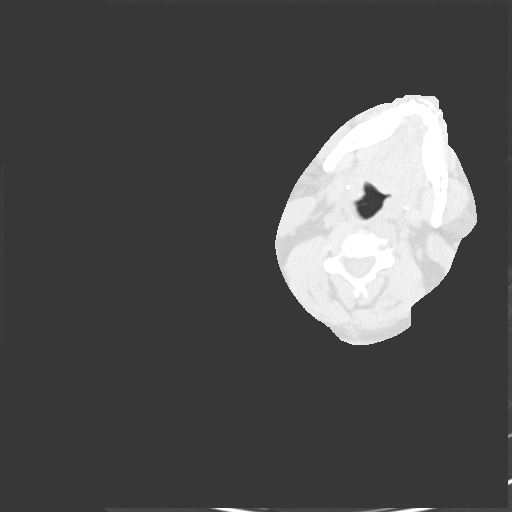

[14 of 32 positions shown; findings below may reference images not displayed]

EXAM:
CT GUIDED NEEDLE ASPIRATE BIOPSY OF INTRAMUSCULAR ABSCESS

ANESTHESIA/SEDATION:
Intravenous Fentanyl [SH] and Versed 3mg were administered as
conscious sedation during continuous monitoring of the patient's
level of consciousness and physiological / cardiorespiratory status
by the radiology RN, with a total moderate sedation time of less
than 20 minutes.

PROCEDURE:
The procedure risks, benefits, and alternatives were explained to
the patient. Questions regarding the procedure were encouraged and
answered. The patient understands and consents to the procedure.

Select axial scans through the lower neck and upper chest were
obtained. The right intramuscular collection was localized and an
appropriate skin entry site was determined and marked.

The operative field was prepped with chlorhexidinein a sterile
fashion, and a sterile drape was applied covering the operative
field. A sterile gown and sterile gloves were used for the
procedure. Local anesthesia was provided with 1% Lidocaine.

Under CT fluoroscopic guidance, a 7 cm multi sidehole AREL
needle advanced into the right sternocleidomastoid intramuscular gas
and fluid collection. Approximately 6 mL thin purulent material were
aspirated, sent for Gram stain and culture. Postprocedure scans show
no hemorrhage or other apparent complication. The patient tolerated
the procedure well.

COMPLICATIONS:
None immediate
FINDINGS: Intramuscular gas and fluid collection identified within the right
sternocleidomastoid muscle, which is enlarged compared to the
contralateral. 6 mL thin purulent material were aspirated.
IMPRESSION: 1. Technically successful CT-guided aspiration right
sternocleidomastoid intramuscular abscess.

## 2019-06-04 MED ORDER — MIDAZOLAM HCL 2 MG/2ML IJ SOLN
INTRAMUSCULAR | Status: AC | PRN
  Administered 2019-06-04 (×3): 1 mg via INTRAVENOUS

## 2019-06-04 MED ORDER — LIDOCAINE HCL 1 % IJ SOLN
INTRAMUSCULAR | Status: AC
  Filled 2019-06-04: qty 20

## 2019-06-04 MED ORDER — ENOXAPARIN SODIUM 60 MG/0.6ML ~~LOC~~ SOLN
60.0000 mg | SUBCUTANEOUS | Status: DC
  Administered 2019-06-05 – 2019-06-06 (×2): 60 mg via SUBCUTANEOUS
  Filled 2019-06-04 (×2): qty 0.6

## 2019-06-04 MED ORDER — FENTANYL CITRATE (PF) 100 MCG/2ML IJ SOLN
INTRAMUSCULAR | Status: AC | PRN
  Administered 2019-06-04 (×3): 50 ug via INTRAVENOUS

## 2019-06-04 MED ORDER — FENTANYL CITRATE (PF) 100 MCG/2ML IJ SOLN
INTRAMUSCULAR | Status: AC
  Filled 2019-06-04: qty 4

## 2019-06-04 MED ORDER — MIDAZOLAM HCL 2 MG/2ML IJ SOLN
INTRAMUSCULAR | Status: AC
  Filled 2019-06-04: qty 4

## 2019-06-04 NOTE — Progress Notes (Addendum)
   Subjective:  Herbert Walsh is a 29 y.o. M with PMH of RA, HTN, HLD, PTSD admit for MSSA bacteremia on hospital day 5  Patient reports pain is tolerable. NPO for aspiration of [place] joint. Denies any acute changes overnight. All questions and concerns addressed.   Objective:  Vital signs in last 24 hours: Vitals:   06/03/19 1406 06/03/19 2038 06/04/19 0405 06/04/19 0406  BP: 110/60 136/71 129/69   Pulse: 90 (!) 110 (!) 111 (!) 109  Resp: 19 18 16   Temp: 98.2 F (36.8 C) 98.6 F (37 C) 97.9 F (36.6 C)   TempSrc: Oral Oral Oral   SpO2: 94% 95% 96% 96%  Weight:      Height:        General: NAD, nl appearance HEENT: ROM intact, edema around right clavicle, antiicteric sclera  Cardiovascular: Normal rate, regular rhythm.  No murmurs, rubs, or gallops Pulmonary : Effort normal, breath sounds normal. No wheezes, rales, or rhonchi Abdominal: soft, nontender,  bowel sounds present Skin:erythema improved over R. Clavicle, edema present .Right leg wrapped  Psychiatric/Behavioral:  normal mood, normal behavior    CBC Latest Ref Rng & Units 06/04/2019 06/03/2019 06/02/2019  WBC 4.0 - 10.5 K/uL 16.7(H) 15.5(H) 13.6(H)  Hemoglobin 13.0 - 17.0 g/dL 11.4(L) 11.1(L) 10.2(L)  Hematocrit 39.0 - 52.0 % 36.2(L) 35.3(L) 32.5(L)  Platelets 150 - 400 K/uL 492(H) 434(H) 410(H)   BMP Latest Ref Rng & Units 06/04/2019 06/01/2019 05/31/2019  Glucose 70 - 99 mg/dL 141(H) 184(H) 194(H)  BUN 6 - 20 mg/dL 7 8 8  Creatinine 0.61 - 1.24 mg/dL 0.70 0.67 0.79  BUN/Creat Ratio 6 - 22 (calc) - - -  Sodium 135 - 145 mmol/L 137 134(L) 132(L)  Potassium 3.5 - 5.1 mmol/L 4.0 4.0 4.3  Chloride 98 - 111 mmol/L 103 105 100  CO2 22 - 32 mmol/L 23 19(L) 21(L)  Calcium 8.9 - 10.3 mg/dL 8.6(L) 8.4(L) 8.6(L)    Assessment/Plan:  Principal Problem:   MSSA bacteremia Active Problems:   Septic arthritis of right sternoclavicular joint (HCC)   Rheumatoid arthritis (HCC)   Septic arthritis of knee, right (HCC)  Myofascitis   Prediabetes  Herbert Walsh is a 29 y.o. M with PMH of RA, HTN, HLD, PTSD admit for MSSA bacteremia with septic arthritis.  MSSA Bacteremia R Knee septic arthritis RLE Myositis / Fascitis Sternoclavicular Septic Arthritis/Osteomyelitis Disseminated MSSA infection with multiple joint involvement. S/p R knee washout. CT surgery consulted IR for sternoclavicular joint aspiration. ID consulted. No signs of endocarditis on TTE. Plan for TEE on Thursday. Repeat blood cx on 4/18 NGTD. Afebrile overnight. Vitals stable.Currently on cefazolin. WBC.  trending up,trending up started 2 days after narrowing antibiotics. Will follow culture results from aspiration of [place].  - Appreciate ortho, ct surg, ID recs, IR - IR for [place] joint aspiration - C/w Cefazolin (Day 6 of abx) - F/u blood cultures - Trend cbc - Monitor vitals, new joint involvements - Percocet PRN for moderate pain and Dilaudid PRN for severe pain   PTSD/Intermittent Explosive Disorder C/w home meds: citalopram 40mg daily, lithium 450mg/300mg BID, Risperdal 4mg qhs, Sertraline 50mg daily  DVT prophx: Lovenox Diet: Carb modified Bowel: Miralax Code: Full  Dispo: Anticipated discharge in approximately 11-14 day(s).   Jeff Suhail Peloquin, MD PGY1     

## 2019-06-04 NOTE — Procedures (Signed)
  Procedure: CT aspiration R SCM intramuscular abscess 74ml thin purulent, sent for GS, C&S EBL:   minimal Complications:  none immediate  See full dictation in [employer].  Thora Lance MD Main # [phone] Pager  [phone]

## 2019-06-04 NOTE — Progress Notes (Cosign Needed)
    [facility] has been requested to perform a transesophageal echocardiogram on 06/05/19 for bacteremia.  After careful review of history and examination, the risks and benefits of transesophageal echocardiogram have been explained including risks of esophageal damage, perforation (1:10,000 risk), bleeding, pharyngeal hematoma as well as other potential complications associated with conscious sedation including aspiration, arrhythmia, respiratory failure and death. Alternatives to treatment were discussed, questions were answered. Patient is willing to proceed. Labs and vital signs stable.   Laverda Page, NP-C 06/04/2019 1:20 PM

## 2019-06-04 NOTE — Progress Notes (Signed)
   06/04/19 1022  Assess: MEWS Score  Temp 98.5 F (36.9 C)  BP (!) 149/99  Pulse Rate (!) 121  Resp 20  Level of Consciousness Alert  SpO2 95 %  O2 Device Room Air  Assess: MEWS Score  MEWS Temp 0  MEWS Systolic 0  MEWS Pulse 2  MEWS RR 0  MEWS LOC 0  MEWS Score 2  MEWS Score Color Yellow  Assess: if the MEWS score is Yellow or Red  Were vital signs taken at a resting state? Yes  Focused Assessment Documented focused assessment  Early Detection of Sepsis Score *See Row Information* Low  MEWS guidelines implemented *See Row Information* No, other (Comment) (recurring issue with elevated R)  Treat  MEWS Interventions Administered scheduled meds/treatments   Patient has had recurrent tachycardia during this admission usually after ambulation or procedure. Patient experienced tachycardia while in CT and returned to the floor with the same. Pain 8-9/10. PRN pain medication and scheduled lisinopril given. VSS rechecked and HR has improved. MD aware and ordered EKG which is on the chart.

## 2019-06-04 NOTE — H&P (View-Only) (Signed)
   Subjective:  Herbert Walsh is a 29 y.o. M with PMH of RA, HTN, HLD, PTSD admit for MSSA bacteremia on hospital day 5  Patient reports pain is tolerable. NPO for aspiration of [place] joint. Denies any acute changes overnight. All questions and concerns addressed.   Objective:  Vital signs in last 24 hours: Vitals:   06/03/19 1406 06/03/19 2038 06/04/19 0405 06/04/19 0406  BP: 110/60 136/71 129/69   Pulse: 90 (!) 110 (!) 111 (!) 109  Resp: 19 18 16    Temp: 98.2 F (36.8 C) 98.6 F (37 C) 97.9 F (36.6 C)   TempSrc: Oral Oral Oral   SpO2: 94% 95% 96% 96%  Weight:      Height:        General: NAD, nl appearance HEENT: ROM intact, edema around right clavicle, antiicteric sclera  Cardiovascular: Normal rate, regular rhythm.  No murmurs, rubs, or gallops Pulmonary : Effort normal, breath sounds normal. No wheezes, rales, or rhonchi Abdominal: soft, nontender,  bowel sounds present Skin:erythema improved over R. Clavicle, edema present .Right leg wrapped  Psychiatric/Behavioral:  normal mood, normal behavior    CBC Latest Ref Rng & Units 06/04/2019 06/03/2019 06/02/2019  WBC 4.0 - 10.5 K/uL 16.7(H) 15.5(H) 13.6(H)  Hemoglobin 13.0 - 17.0 g/dL 11.4(L) 11.1(L) 10.2(L)  Hematocrit 39.0 - 52.0 % 36.2(L) 35.3(L) 32.5(L)  Platelets 150 - 400 K/uL 492(H) 434(H) 410(H)   BMP Latest Ref Rng & Units 06/04/2019 06/01/2019 05/31/2019  Glucose 70 - 99 mg/dL 06/02/2019) 174(Y) 814(G)  BUN 6 - 20 mg/dL 7 8 8   Creatinine 0.61 - 1.24 mg/dL 818(H 6.31  BUN/Creat Ratio 6 - 22 (calc) - - -  Sodium 135 - 145 mmol/L 137 134(L) 132(L)  Potassium 3.5 - 5.1 mmol/L 4.0 4.0 4.3  Chloride 98 - 111 mmol/L 103 105 100  CO2 22 - 32 mmol/L 23 19(L) 21(L)  Calcium 8.9 - 10.3 mg/dL 4.97) 0.26) 3.7(C)    Assessment/Plan:  Principal Problem:   MSSA bacteremia Active Problems:   Septic arthritis of right sternoclavicular joint (HCC)   Rheumatoid arthritis (HCC)   Septic arthritis of knee, right (HCC)  Myofascitis   Prediabetes  Herbert Walsh is a 29 y.o. M with PMH of RA, HTN, HLD, PTSD admit for MSSA bacteremia with septic arthritis.  MSSA Bacteremia R Knee septic arthritis RLE Myositis / Fascitis Sternoclavicular Septic Arthritis/Osteomyelitis Disseminated MSSA infection with multiple joint involvement. S/p R knee washout. CT surgery consulted IR for sternoclavicular joint aspiration. ID consulted. No signs of endocarditis on TTE. Plan for TEE on Thursday. Repeat blood cx on 4/18 NGTD. Afebrile overnight. Vitals stable.Currently on cefazolin. WBC.  trending up,trending up started 2 days after narrowing antibiotics. Will follow culture results from aspiration of [place].  - Appreciate ortho, ct surg, ID recs, IR - IR for [place] joint aspiration - C/w Cefazolin (Day 6 of abx) - F/u blood cultures - Trend cbc - Monitor vitals, new joint involvements - Percocet PRN for moderate pain and Dilaudid PRN for severe pain   PTSD/Intermittent Explosive Disorder C/w home meds: citalopram 40mg  daily, lithium 450mg /300mg  BID, Risperdal 4mg  qhs, Sertraline 50mg  daily  DVT prophx: Lovenox Diet: Carb modified Bowel: Miralax Code: Full  Dispo: Anticipated discharge in approximately 11-14 day(s).   5/18, MD PGY1

## 2019-06-04 NOTE — Progress Notes (Signed)
4 Days Post-Op Procedure(s) (LRB): IRRIGATION AND DEBRIDEMENT LOWER EXTREMITY (Right) Subjective: Patient had right sternoclavicular joint aspirated by IR yesterday-3 cc of cloudy fluid obtained.  Gram stain shows WBCs and rare gram-positive cocci.  Patient's pain over the sternal clavicular joint is improved. Erythema and induration also appear improved on exam He remains afebrile with white count stable 15-16000 I recommend continue IV antibiotics and close observation. I do not feel that opening the sternoclavicular joint would benefit the patient at this time.  Objective: Vital signs in last 24 hours: Temp:  [97.9 F (36.6 C)-98.6 F (37 C)] 98.5 F (36.9 C) (04/21 1219) Pulse Rate:  [104-130] 104 (04/21 1219) Cardiac Rhythm: Sinus tachycardia (04/21 1000) Resp:  [16-24] 20 (04/21 1219) BP: (129-150)/(69-99) 136/81 (04/21 1219) SpO2:  [93 %-97 %] 93 % (04/21 1219)  Hemodynamic parameters for last 24 ho  Intake/Output from previous day: 04/20 0701 - 04/21 0700 In: 4784.1 [P.O.:420; I.V.:3964.1; IV Piggyback:400] Out: 4650 [Urine:4650] Intake/Output this shift: Total I/O In: 474 [P.O.:474] Out: 600 [Urine:600]  Normal sinus rhythm No murmurs Erythema and induration over sternoclavicular joint improved on exam  Lab Results: Recent Labs    06/03/19 0350 06/04/19 0317  WBC 15.5* 16.7*  HGB 11.1* 11.4*  HCT 35.3* 36.2*  PLT 434* 492*   BMET:  Recent Labs    06/04/19 0317  NA 137  K 4.0  CL 103  CO2 23  GLUCOSE 141*  BUN 7  CREATININE 0.70  CALCIUM 8.6*    PT/INR:  Recent Labs    06/03/19 0350  LABPROT 14.8  INR 1.2   ABG No results found for: PHART, HCO3, TCO2, ACIDBASEDEF, O2SAT CBG (last 3)  Recent Labs    06/04/19 0814 06/04/19 1234 06/04/19 1737  GLUCAP 145* 165* 141*    Assessment/Plan: S/P Procedure(s) (LRB): IRRIGATION AND DEBRIDEMENT LOWER EXTREMITY (Right) Patient is set up for TEE for bacteremia Continue IV antibiotics and  clinical observation of sternoclavicular joint  LOS: 5 days    Kathlee Nations Trigt III 06/04/2019

## 2019-06-05 ENCOUNTER — Inpatient Hospital Stay (HOSPITAL_COMMUNITY): Payer: BC Managed Care – PPO | Admitting: Certified Registered Nurse Anesthetist

## 2019-06-05 ENCOUNTER — Encounter (HOSPITAL_COMMUNITY)
Admission: EM | Disposition: A | Discharge status: Home / Self Care | Payer: Self-pay | Source: Home / Self Care | Attending: Internal Medicine

## 2019-06-05 ENCOUNTER — Inpatient Hospital Stay (HOSPITAL_COMMUNITY): Payer: BC Managed Care – PPO

## 2019-06-05 ENCOUNTER — Encounter (HOSPITAL_COMMUNITY): Payer: Self-pay | Admitting: Student in an Organized Health Care Education/Training Program

## 2019-06-05 DIAGNOSIS — I10 Essential (primary) hypertension: Secondary | ICD-10-CM

## 2019-06-05 DIAGNOSIS — M00011 Staphylococcal arthritis, right shoulder: Secondary | ICD-10-CM

## 2019-06-05 DIAGNOSIS — R7881 Bacteremia: Secondary | ICD-10-CM

## 2019-06-05 HISTORY — PX: TEE WITHOUT CARDIOVERSION: SHX5443

## 2019-06-05 LAB — BASIC METABOLIC PANEL
Anion gap: 14 (ref 5–15)
Anion gap: 12 (ref 5–15)
BUN: 7 mg/dL (ref 6–20)
BUN: 8 mg/dL (ref 6–20)
CO2: 20 mmol/L — ABNORMAL LOW (ref 22–32)
CO2: 22 mmol/L (ref 22–32)
Calcium: 9 mg/dL (ref 8.9–10.3)
Calcium: 8.6 mg/dL — ABNORMAL LOW (ref 8.9–10.3)
Chloride: 99 mmol/L (ref 98–111)
Chloride: 98 mmol/L (ref 98–111)
Creatinine, Ser: 0.64 mg/dL (ref 0.61–1.24)
Creatinine, Ser: 0.66 mg/dL (ref 0.61–1.24)
GFR calc Af Amer: >60 mL/min (ref >60)
GFR calc Af Amer: >60 mL/min (ref >60)
GFR calc non Af Amer: >60 mL/min (ref >60)
GFR calc non Af Amer: >60 mL/min (ref >60)
Glucose, Bld: 125 mg/dL — ABNORMAL HIGH (ref 70–99)
Glucose, Bld: 161 mg/dL — ABNORMAL HIGH (ref 70–99)
Potassium: 4.8 mmol/L (ref 3.5–5.1)
Potassium: 4.1 mmol/L (ref 3.5–5.1)
Sodium: 133 mmol/L — ABNORMAL LOW (ref 135–145)
Sodium: 132 mmol/L — ABNORMAL LOW (ref 135–145)

## 2019-06-05 LAB — CBC
HCT: 40.9 % (ref 39.0–52.0)
Hemoglobin: 12.9 g/dL — ABNORMAL LOW (ref 13.0–17.0)
MCH: 24.6 pg — ABNORMAL LOW (ref 26.0–34.0)
MCHC: 31.5 g/dL (ref 30.0–36.0)
MCV: 77.9 fL — ABNORMAL LOW (ref 80.0–100.0)
Platelets: 354 10*3/uL (ref 150–400)
RBC: 5.25 MIL/uL (ref 4.22–5.81)
RDW: 16.7 % — ABNORMAL HIGH (ref 11.5–15.5)
WBC: 15.2 10*3/uL — ABNORMAL HIGH (ref 4.0–10.5)
nRBC: 0 % (ref 0.0–0.2)

## 2019-06-05 LAB — GLUCOSE, CAPILLARY
Glucose-Capillary: 180 mg/dL — ABNORMAL HIGH (ref 70–99)
Glucose-Capillary: 150 mg/dL — ABNORMAL HIGH (ref 70–99)
Glucose-Capillary: 144 mg/dL — ABNORMAL HIGH (ref 70–99)
Glucose-Capillary: 153 mg/dL — ABNORMAL HIGH (ref 70–99)

## 2019-06-05 LAB — MAGNESIUM: Magnesium: 1.7 mg/dL (ref 1.7–2.4)

## 2019-06-05 SURGERY — ECHOCARDIOGRAM, TRANSESOPHAGEAL
Anesthesia: Monitor Anesthesia Care

## 2019-06-05 MED ORDER — LACTATED RINGERS IV SOLN: INTRAVENOUS | Status: DC | PRN

## 2019-06-05 MED ORDER — TRAZODONE HCL 150 MG PO TABS
150.0000 mg | ORAL_TABLET | Freq: Every day | ORAL | Status: DC
  Administered 2019-06-05: 150 mg via ORAL
  Filled 2019-06-05: qty 1

## 2019-06-05 MED ORDER — PROPOFOL 500 MG/50ML IV EMUL
INTRAVENOUS | Status: DC | PRN
  Administered 2019-06-05: 150 ug/kg/min via INTRAVENOUS

## 2019-06-05 MED ORDER — BUTAMBEN-TETRACAINE-BENZOCAINE 2-2-14 % EX AERO
INHALATION_SPRAY | CUTANEOUS | Status: DC | PRN
  Administered 2019-06-05: 08:00:00 2 via TOPICAL

## 2019-06-05 MED ORDER — PROPOFOL 10 MG/ML IV BOLUS
INTRAVENOUS | Status: DC | PRN
  Administered 2019-06-05: 10 mg via INTRAVENOUS
  Administered 2019-06-05: 20 mg via INTRAVENOUS
  Administered 2019-06-05: 30 mg via INTRAVENOUS

## 2019-06-05 NOTE — Progress Notes (Signed)
PHARMACY CONSULT NOTE FOR:  OUTPATIENT  PARENTERAL ANTIBIOTIC THERAPY (OPAT)  Indication: MSSA bacteremia/osteo of sternoclavicular joint Regimen: Cefazolin 2g Q8H End date: 07/13/2019  IV antibiotic discharge orders are pended. To discharging provider:  please sign these orders via discharge navigator,  Select New Orders & click on the button choice - Manage This Unsigned Work.     Thank you for allowing pharmacy to be a part of this patient's care.  Noel Journey  Student-PharmD 06/05/2019, 11:57 AM

## 2019-06-05 NOTE — TOC Initial Note (Signed)
Transition of Care [facility]) - Initial/Assessment Note    Patient Details  Name: Herbert Walsh MRN: [id number] Date of Birth: 1990-08-01  [facility]) CM/SW Contact:    Herbert Favre, RN Phone Number: 06/05/2019, 2:24 PM  Clinical Narrative:                 Spoke to patient and his mother Herbert Walsh [phone] at bedside.   Confirmed face sheet information. Explained patient needs IV ABX until 07/13/19 at home.   [facility] will provide ABX . Pam with [facility] will see patient and mother prior to discharge and start teaching. Patient will have a nurse with [facility]. Nurse is ONLY for PICC line. Patient and mother will be taught how to adm IV ABX. Both voiced understanding.   Expected Discharge Plan: [place] Barriers to Discharge: Continued Medical Work up   Patient Goals and CMS Choice Patient states their goals for this hospitalization and ongoing recovery are:: to return to home CMS Medicare.gov Compare Post Acute Care list provided to:: Patient Choice offered to / list presented to : Patient, Parent(mother Herbert Walsh [phone])  Expected Discharge Plan and Services Expected Discharge Plan: [place]   Discharge Planning Services: CM Consult   Living arrangements for the past 2 months: Single Family Home                   DME Agency: NA       HH Arranged: RN          Prior Living Arrangements/Services Living arrangements for the past 2 months: Single Family Home Lives with:: Parents Patient language and need for interpreter reviewed:: Yes Do you feel safe going back to the place where you live?: Yes      Need for Family Participation in Patient Care: Yes (Comment) Care giver support system in place?: Yes (comment)   Criminal Activity/Legal Involvement Pertinent to Current Situation/Hospitalization: No - Comment as needed  Activities of Daily Living   ADL Screening  (condition at time of admission) Patient's cognitive ability adequate to safely complete daily activities?: Yes Is the patient deaf or have difficulty hearing?: No Does the patient have difficulty seeing, even when wearing glasses/contacts?: No Does the patient have difficulty concentrating, remembering, or making decisions?: No Patient able to express need for assistance with ADLs?: Yes Does the patient have difficulty dressing or bathing?: No Independently performs ADLs?: Yes (appropriate for developmental age) Does the patient have difficulty walking or climbing stairs?: Yes Weakness of Legs: Right(s/p surgery) Weakness of Arms/Hands: None  Permission Sought/Granted   Permission granted to share information with : Yes, Verbal Permission Granted  Share Information with NAME: mother Herbert Walsh [phone]           Emotional Assessment Appearance:: Appears stated age Attitude/Demeanor/Rapport: Engaged Affect (typically observed): Accepting Orientation: : Oriented to Self, Oriented to Place, Oriented to  Time, Oriented to Situation Alcohol / Substance Use: Not Applicable Psych Involvement: No (comment)  Admission diagnosis:  Septic arthritis [facility]) [M00.9] Patient Active Problem List   Diagnosis Date Noted  . Septic arthritis of knee, right ([place]) 05/31/2019  . Myofascitis 05/31/2019  . Prediabetes 05/31/2019  . MSSA bacteremia 05/31/2019  . Septic arthritis of right sternoclavicular joint ([place]) 05/30/2019  . Rheumatoid arthritis ([place]) 05/30/2019  . Glucosuria 05/30/2019  . 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 Difficulty controlling anger 03/19/2019  . PTSD (post-traumatic  stress disorder) 11/01/2017   PCP:  Herbert Specking, MD Pharmacy:   [facility] [address], [place] - [address] [place] [address] Phone: [phone] Fax: [phone]     Social Determinants of Health (SDOH) Interventions    Readmission Risk Interventions No flowsheet data  found.

## 2019-06-05 NOTE — Anesthesia Procedure Notes (Signed)
Procedure Name: MAC Date/Time: 06/05/2019 8:12 AM Performed by: Janene Harvey, CRNA Pre-anesthesia Checklist: Patient identified, Emergency Drugs available, Suction available and Patient being monitored Oxygen Delivery Method: Nasal cannula Placement Confirmation: positive ETCO2 Dental Injury: Teeth and Oropharynx as per pre-operative assessment

## 2019-06-05 NOTE — Transfer of Care (Signed)
Immediate Anesthesia Transfer of Care Note  Patient: Herbert Walsh  Procedure(s) Performed: TRANSESOPHAGEAL ECHOCARDIOGRAM (TEE) (N/A )  Patient Location: Endoscopy Unit  Anesthesia Type:MAC  Level of Consciousness: awake  Airway & Oxygen Therapy: Patient Spontanous Breathing and Patient connected to nasal cannula oxygen  Post-op Assessment: Report given to RN and Post -op Vital signs reviewed and stable  Post vital signs: Reviewed  Last Vitals:  Vitals Value Taken Time  BP    Temp    Pulse 122 06/05/19 0836  Resp 31 06/05/19 0836  SpO2 97 % 06/05/19 0836  Vitals shown include unvalidated device data.  Last Pain:  Vitals:   06/05/19 0836  TempSrc: (P) Temporal  PainSc:       Patients Stated Pain Goal: 2 (38/68/54 8830)  Complications: No apparent anesthesia complications

## 2019-06-05 NOTE — Progress Notes (Signed)
Day of Surgery Procedure(s) (LRB): TRANSESOPHAGEAL ECHOCARDIOGRAM (TEE) (N/A) Subjective: Patient's right sternoclavicular joint pain has improved.  White count has slightly improved.  He remains afebrile.  He is mainly complaining of pain in his right leg.  3 cc aspirated from the right sternoclavicular joint now growing Staphylococcus. Exam of the   joint soft tissue shows no fluctuance, improved cellulitis and less tenderness.  Patient had TEE earlier today to rule out endocarditis because of bacteremia-findings include normal LV function, no endocarditis present  Objective: Vital signs in last 24 hours: Temp:  [97.5 F (36.4 C)-99.8 F (37.7 C)] 98.1 F (36.7 C) (04/22 0915) Pulse Rate:  [105-128] 105 (04/22 0915) Cardiac Rhythm: Supraventricular tachycardia (04/22 1019) Resp:  [17-26] 18 (04/22 0915) BP: (124-155)/(70-90) 147/82 (04/22 0915) SpO2:  [90 %-98 %] 92 % (04/22 0915) Weight:  [433 kg] 127 kg (04/22 0723)  Hemodynamic parameters for last 24 hours:    Intake/Output from previous day: 04/21 0701 - 04/22 0700 In: 1829.9 [P.O.:654; I.V.:875.9; IV Piggyback:300] Out: 2800 [Urine:2800] Intake/Output this shift: Total I/O In: 250 [I.V.:250] Out: 400 [Urine:400]  Alert and responsive Breath sounds clear Heart rhythm regular out murmur Slight erythema and induration over right sternoclavicular joint  Lab Results: Recent Labs    06/04/19 0317 06/05/19 0118  WBC 16.7* 15.2*  HGB 11.4* 12.9*  HCT 36.2* 40.9  PLT 492* 354   BMET:  Recent Labs    06/04/19 0317 06/05/19 0118  NA 137 133*  K 4.0 4.8  CL 103 99  CO2 23 20*  GLUCOSE 141* 125*  BUN 7 7  CREATININE 0.70 0.64  CALCIUM 8.6* 9.0    PT/INR:  Recent Labs    06/03/19 0350  LABPROT 14.8  INR 1.2   ABG No results found for: PHART, HCO3, TCO2, ACIDBASEDEF, O2SAT CBG (last 3)  Recent Labs    06/04/19 2229 06/05/19 0914 06/05/19 1204  GLUCAP 160* 180* 150*    Assessment/Plan: S/P  Procedure(s) (LRB): TRANSESOPHAGEAL ECHOCARDIOGRAM (TEE) (N/A) Continue IV antibiotics for MSSA bacteremia and infection of the right sternoclavicular joint which appears to be improving by clinical exam.  Would not recommend surgery on the right sternoclavicular joint at this time.   LOS: 6 days    Kathlee Nations Trigt III 06/05/2019

## 2019-06-05 NOTE — Progress Notes (Signed)
    Transesophageal Echocardiogram Note  Murphy Duzan [id number] 15-Jul-1990  Procedure: Transesophageal Echocardiogram Indications: Bacteremia  Procedure Details Consent: Obtained Time Out: Verified patient identification, verified procedure, site/side was marked, verified correct patient position, special equipment/implants available, Radiology Safety Procedures followed,  medications/allergies/relevent history reviewed, required imaging and test results available.  Performed  Medications:  Pt sedated by anesthesia with diprovan 260 mg IV total.  Normal LV function; No vegetations.   Complications: No apparent complications Patient did tolerate procedure well.  Olga Millers, MD

## 2019-06-05 NOTE — Progress Notes (Signed)
  Echocardiogram Echocardiogram Transesophageal has been performed.  Herbert Walsh 06/05/2019, 8:38 AM

## 2019-06-05 NOTE — Anesthesia Postprocedure Evaluation (Signed)
Anesthesia Post Note  Patient: Herbert Walsh  Procedure(s) Performed: TRANSESOPHAGEAL ECHOCARDIOGRAM (TEE) (N/A )     Patient location during evaluation: PACU Anesthesia Type: MAC Level of consciousness: awake and alert Pain management: pain level controlled Vital Signs Assessment: post-procedure vital signs reviewed and stable Respiratory status: spontaneous breathing, nonlabored ventilation, respiratory function stable and patient connected to nasal cannula oxygen Cardiovascular status: stable and blood pressure returned to baseline Postop Assessment: no apparent nausea or vomiting Anesthetic complications: no    Last Vitals:  Vitals:   06/05/19 0845 06/05/19 0915  BP: (!) 141/75 (!) 147/82  Pulse: (!) 122 (!) 105  Resp: 19 18  Temp:  36.7 C  SpO2: 94% 92%    Last Pain:  Vitals:   06/05/19 0915  TempSrc: Oral  PainSc:                  [name]

## 2019-06-05 NOTE — Anesthesia Preprocedure Evaluation (Addendum)
Anesthesia Evaluation  Patient identified by MRN, date of birth, ID band Patient awake    Reviewed: Allergy & Precautions, NPO status , Patient's Chart, lab work & pertinent test results  Airway Mallampati: I  TM Distance: <3 FB Neck ROM: Full  Mouth opening: Limited Mouth Opening  Dental  (+) Teeth Intact, Poor Dentition   Pulmonary Current Smoker,    Pulmonary exam normal        Cardiovascular hypertension,  Rhythm:Regular Rate:Tachycardia     Neuro/Psych PSYCHIATRIC DISORDERS Anxiety    GI/Hepatic negative GI ROS, Neg liver ROS,   Endo/Other  negative endocrine ROS  Renal/GU negative Renal ROS     Musculoskeletal  (+) Arthritis ,   Abdominal (+) + obese,   Peds  Hematology negative hematology ROS (+)   Anesthesia Other Findings   Reproductive/Obstetrics                           Echo:  1. Left ventricular ejection fraction, by estimation, is 60 to 65%. The  left ventricle has normal function. The left ventricle has no regional  wall motion abnormalities. There is mild left ventricular hypertrophy.  Left ventricular diastolic parameters  were normal.  2. Right ventricular systolic function is normal. The right ventricular  size is normal. Tricuspid regurgitation signal is inadequate for assessing  PA pressure.  3. The mitral valve is normal in structure. No evidence of mitral valve  regurgitation.  4. The aortic valve was not well visualized. Aortic valve regurgitation  is not visualized. No aortic stenosis is present.  5. The inferior vena cava is normal in size with greater than 50%  respiratory variability, suggesting right atrial pressure of 3 mmHg.  6. No vegetation seen, though technically difficult study. If clinical  suspicion for endocarditis, consider TEE   Anesthesia Physical Anesthesia Plan  ASA: II  Anesthesia Plan: MAC   Post-op Pain Management:     Induction: Intravenous  PONV Risk Score and Plan: 0 and Propofol infusion  Airway Management Planned: Natural Airway and Nasal Cannula  Additional Equipment: None  Intra-op Plan:   Post-operative Plan:   Informed Consent: I have reviewed the patients History and Physical, chart, labs and discussed the procedure including the risks, benefits and alternatives for the proposed anesthesia with the patient or authorized representative who has indicated his/her understanding and acceptance.     Dental advisory given  Plan Discussed with: CRNA  Anesthesia Plan Comments:        Anesthesia Quick Evaluation

## 2019-06-05 NOTE — Progress Notes (Signed)
[facility]  Date of Admission:  05/30/2019      Total days of antibiotics 7  Cefazolin day 6         ASSESSMENT: Herbert Walsh is a 29 y.o. male with MSSA bacteremia complicated by R knee septic arthritis (s/p I&D 4/17) and R [place] joint septic effusion/sternocleidomastoid muscle abscess (s/p IR aspiration 4/20). He underwent TEE today and reveals no concern for endocarditis with normal LV and no valvular dysfunction.   He seems to have a few barriers to discharge with his pain control and immobility. I encouraged him to try to get up and walk around with physical therapy and attempt the PO regimen he has offered to him for longer lasting relief.  He will require IV antibiotics for 6 weeks - needs PICC line placed. We discussed this briefly today. He will be eventually going to his mother's home. He denies any drug use and denies any cuts/abrasions recently prior to this episode. He does have a history of RA and corticosteroid use only recently and briefly, not long term.   Will need follow up with TCTS and Ortho for care of infected sites.   Sed Rate (mm/hr)  Date Value  05/30/2019 90 (H)   CRP (mg/dL)  Date Value  40/98/1191 39.8 (H)     PLAN: 1. Continue Cefazolin  2. Will discuss PICC more tomorrow 4/23 3. Home Health aware for coordination of care     Principal Problem:   MSSA bacteremia Active Problems:   Septic arthritis of right sternoclavicular joint (HCC)   Rheumatoid arthritis (HCC)   Septic arthritis of knee, right (HCC)   Myofascitis   Prediabetes   . Chlorhexidine Gluconate Cloth  6 each Topical Daily  . citalopram  40 mg Oral Daily  . enoxaparin (LOVENOX) injection  60 mg Subcutaneous Q24H  . insulin aspart  0-5 Units Subcutaneous QHS  . insulin aspart  0-9 Units Subcutaneous TID WC  . lisinopril  10 mg Oral Daily  . lithium carbonate  750 mg Oral QPM  . mupirocin ointment  1 application Nasal BID  . risperidone   4 mg Oral QHS  . sertraline  50 mg Oral Daily  . traZODone  150 mg Oral QHS    SUBJECTIVE: Mom in the room with him.  He is concerned about the throbbing pain in the right knee. Has not had dressing changed since surgery.  Has not walked and still requiring IV dliaudid for pain control.   TEE obtained and r/o endocarditis. [place] joint is a little less tender    Review of Systems: Review of Systems  Constitutional: Negative for chills, fever, malaise/fatigue and weight loss.  Respiratory: Negative for cough and sputum production.   Cardiovascular: Positive for chest pain (L [place] joint tenderness but described to be improved). Negative for leg swelling.  Gastrointestinal: Negative for abdominal pain, diarrhea and vomiting.  Genitourinary: Negative for dysuria and flank pain.  Musculoskeletal: Positive for joint pain (R knee pain ). Negative for myalgias and neck pain.  Skin: Negative for rash.  Neurological: Negative for dizziness, tingling and headaches.  Psychiatric/Behavioral: Negative for depression and substance abuse. The patient is not nervous/anxious and does not have insomnia.     No Known Allergies  OBJECTIVE: Vitals:   06/05/19 0836 06/05/19 0845 06/05/19 0915 06/05/19 1445  BP: 124/80 (!) 141/75 (!) 147/82 (!) 148/88  Pulse: (!) 124 (!) 122 (!) 105 (!) 110  Resp: (!) 26 19 18    Temp: (!) 97.5 F (36.4 C)  98.1 F (36.7 C) 97.8 F (36.6 C)  TempSrc: Temporal  Oral Oral  SpO2: 98% 94% 92% 93%  Weight:      Height:       Body mass index is 37.97 kg/m.  Physical Exam Constitutional:      Comments: Resting in bed on right side. No distress.   HENT:     Mouth/Throat:     Mouth: Mucous membranes are moist.     Pharynx: Oropharynx is clear.  Eyes:     General: No scleral icterus. Cardiovascular:     Rate and Rhythm: Normal rate and regular rhythm.     Heart sounds: No murmur.  Pulmonary:     Effort: Pulmonary effort is normal.     Breath sounds: Normal breath  sounds.  Chest:     Comments: Tenderness to the L [place] but no significant swelling. Some erythema  Abdominal:     General: Abdomen is flat. Bowel sounds are normal. There is no distension.     Palpations: Abdomen is soft.  Musculoskeletal:     Right knee: Swelling present. Tenderness present.     Comments: Laying on right leg in decubitus position. Not willing to move much. Dressing material is loose and clean / dry.   Skin:    General: Skin is warm and dry.     Capillary Refill: Capillary refill takes less than 2 seconds.  Neurological:     Mental Status: He is alert.     Lab Results Lab Results  Component Value Date   WBC 15.2 (H) 06/05/2019   HGB 12.9 (L) 06/05/2019   HCT 40.9 06/05/2019   MCV 77.9 (L) 06/05/2019   PLT 354 06/05/2019    Lab Results  Component Value Date   CREATININE 0.66 06/05/2019   BUN 8 06/05/2019   NA 132 (L) 06/05/2019   K 4.1 06/05/2019   CL 98 06/05/2019   CO2 22 06/05/2019    Lab Results  Component Value Date   ALT 22 05/31/2019   AST 29 05/31/2019   ALKPHOS 85 05/31/2019   BILITOT 1.2 05/31/2019     Microbiology: Recent Results (from the past 240 hour(s))  Urine culture     Status: None   Collection Time: 05/30/19  3:13 PM   Specimen: In/Out Cath Urine  Result Value Ref Range Status   Specimen Description IN/OUT CATH URINE  Final   Special Requests NONE  Final   Culture   Final    NO GROWTH Performed at [facility], [place] [address], [place], [place] [address]    Report Status 05/31/2019 FINAL  Final  SARS CORONAVIRUS 2 (TAT 6-24 HRS) Nasopharyngeal Nasopharyngeal Swab     Status: None   Collection Time: 05/30/19  3:14 PM   Specimen: Nasopharyngeal Swab  Result Value Ref Range Status   SARS Coronavirus 2 NEGATIVE NEGATIVE Final    Comment: (NOTE) SARS-CoV-2 target nucleic acids are NOT DETECTED. The SARS-CoV-2 RNA is generally detectable in upper and lower respiratory specimens during the acute phase of infection.  Negative results do not preclude SARS-CoV-2 infection, do not rule out co-infections with other pathogens, and should not be used as the sole basis for treatment or other patient management decisions. Negative results must be combined with clinical observations, patient history, and epidemiological information. The expected result is Negative. Fact Sheet for Patients: SugarRoll.be Fact Sheet for Healthcare Providers: https://www.woods-mathews.com/ This  test is not yet approved or cleared by the [place] and  has been authorized for detection and/or diagnosis of SARS-CoV-2 by FDA under an Emergency Use Authorization (EUA). This EUA will remain  in effect (meaning this test can be used) for the duration of the COVID-19 declaration under Section 56 4(b)(1) of the Act, 21 U.S.C. section 360bbb-3(b)(1), unless the authorization is terminated or revoked sooner. Performed at [facility], [address], [place], [place] [address]   Blood Culture (routine x 2)     Status: Abnormal   Collection Time: 05/30/19  4:35 PM   Specimen: BLOOD  Result Value Ref Range Status   Specimen Description BLOOD SITE NOT SPECIFIED  Final   Special Requests   Final    BOTTLES DRAWN AEROBIC AND ANAEROBIC Blood Culture results may not be optimal due to an inadequate volume of blood received in culture bottles   Culture  Setup Time   Final    GRAM POSITIVE COCCI IN CLUSTERS IN BOTH AEROBIC AND ANAEROBIC BOTTLES CRITICAL RESULT CALLED TO, READ BACK BY AND VERIFIED WITH: Verdia Kuba [id number] 1100 MLM Performed at [facility], [address] [place], [place] [address]    Culture STAPHYLOCOCCUS AUREUS (A)  Final   Report Status 06/02/2019 FINAL  Final   Organism ID, Bacteria STAPHYLOCOCCUS AUREUS  Final      Susceptibility   Staphylococcus aureus - MIC*    CIPROFLOXACIN <=0.5 SENSITIVE Sensitive     ERYTHROMYCIN RESISTANT Resistant      GENTAMICIN <=0.5 SENSITIVE Sensitive     OXACILLIN 0.5 SENSITIVE Sensitive     TETRACYCLINE <=1 SENSITIVE Sensitive     VANCOMYCIN 1 SENSITIVE Sensitive     TRIMETH/SULFA <=10 SENSITIVE Sensitive     CLINDAMYCIN RESISTANT Resistant     RIFAMPIN <=0.5 SENSITIVE Sensitive     Inducible Clindamycin POSITIVE Resistant     * STAPHYLOCOCCUS AUREUS  Blood Culture ID Panel (Reflexed)     Status: Abnormal   Collection Time: 05/30/19  4:35 PM  Result Value Ref Range Status   Enterococcus species NOT DETECTED NOT DETECTED Final   Listeria monocytogenes NOT DETECTED NOT DETECTED Final   Staphylococcus species DETECTED (A) NOT DETECTED Final    Comment: CRITICAL RESULT CALLED TO, READ BACK BY AND VERIFIED WITH: PHARMD T BAUMEISTER [id number] 1100 MLM    Staphylococcus aureus (BCID) DETECTED (A) NOT DETECTED Final    Comment: Methicillin (oxacillin) susceptible Staphylococcus aureus (MSSA). Preferred therapy is anti staphylococcal beta lactam antibiotic (Cefazolin or Nafcillin), unless clinically contraindicated. CRITICAL RESULT CALLED TO, READ BACK BY AND VERIFIED WITH: PHARMD T BAUMEISTER [id number] 1100 MLM    Methicillin resistance NOT DETECTED NOT DETECTED Final   Streptococcus species NOT DETECTED NOT DETECTED Final   Streptococcus agalactiae NOT DETECTED NOT DETECTED Final   Streptococcus pneumoniae NOT DETECTED NOT DETECTED Final   Streptococcus pyogenes NOT DETECTED NOT DETECTED Final   Acinetobacter baumannii NOT DETECTED NOT DETECTED Final   Enterobacteriaceae species NOT DETECTED NOT DETECTED Final   Enterobacter cloacae complex NOT DETECTED NOT DETECTED Final   Escherichia coli NOT DETECTED NOT DETECTED Final   Klebsiella oxytoca NOT DETECTED NOT DETECTED Final   Klebsiella pneumoniae NOT DETECTED NOT DETECTED Final   Proteus species NOT DETECTED NOT DETECTED Final   Serratia marcescens NOT DETECTED NOT DETECTED Final   Haemophilus influenzae NOT DETECTED NOT DETECTED Final   Neisseria  meningitidis NOT DETECTED NOT DETECTED Final   Pseudomonas aeruginosa NOT DETECTED  NOT DETECTED Final   Candida albicans NOT DETECTED NOT DETECTED Final   Candida glabrata NOT DETECTED NOT DETECTED Final   Candida krusei NOT DETECTED NOT DETECTED Final   Candida parapsilosis NOT DETECTED NOT DETECTED Final   Candida tropicalis NOT DETECTED NOT DETECTED Final    Comment: Performed at [facility], [address], [place], [place] [address]  Respiratory Panel by RT PCR (Flu A&B, Covid) - Nasopharyngeal Swab     Status: None   Collection Time: 05/30/19  5:07 PM   Specimen: Nasopharyngeal Swab  Result Value Ref Range Status   SARS Coronavirus 2 by RT PCR NEGATIVE NEGATIVE Final    Comment: (NOTE) SARS-CoV-2 target nucleic acids are NOT DETECTED. The SARS-CoV-2 RNA is generally detectable in upper respiratoy specimens during the acute phase of infection. The lowest concentration of SARS-CoV-2 viral copies this assay can detect is 131 copies/mL. A negative result does not preclude SARS-Cov-2 infection and should not be used as the sole basis for treatment or other patient management decisions. A negative result may occur with  improper specimen collection/handling, submission of specimen other than nasopharyngeal swab, presence of viral mutation(s) within the areas targeted by this assay, and inadequate number of viral copies (<131 copies/mL). A negative result must be combined with clinical observations, patient history, and epidemiological information. The expected result is Negative. Fact Sheet for Patients:  https://www.moore.com/ Fact Sheet for Healthcare Providers:  https://www.young.biz/ This test is not yet ap proved or cleared by the [place] FDA and  has been authorized for detection and/or diagnosis of SARS-CoV-2 by FDA under an Emergency Use Authorization (EUA). This EUA will remain  in effect (meaning this test can be used) for  the duration of the COVID-19 declaration under Section 564(b)(1) of the Act, 21 U.S.C. section 360bbb-3(b)(1), unless the authorization is terminated or revoked sooner.    Influenza A by PCR NEGATIVE NEGATIVE Final   Influenza B by PCR NEGATIVE NEGATIVE Final    Comment: (NOTE) The Xpert Xpress SARS-CoV-2/FLU/RSV assay is intended as an aid in  the diagnosis of influenza from Nasopharyngeal swab specimens and  should not be used as a sole basis for treatment. Nasal washings and  aspirates are unacceptable for Xpert Xpress SARS-CoV-2/FLU/RSV  testing. Fact Sheet for Patients: https://www.moore.com/ Fact Sheet for Healthcare Providers: https://www.young.biz/ This test is not yet approved or cleared by the [place] FDA and  has been authorized for detection and/or diagnosis of SARS-CoV-2 by  FDA under an Emergency Use Authorization (EUA). This EUA will remain  in effect (meaning this test can be used) for the duration of the  Covid-19 declaration under Section 564(b)(1) of the Act, 21  U.S.C. section 360bbb-3(b)(1), unless the authorization is  terminated or revoked. Performed at [facility], [address] [place], [place] [address]   Blood Culture (routine x 2)     Status: None   Collection Time: 05/30/19  6:25 PM   Specimen: BLOOD RIGHT FOREARM  Result Value Ref Range Status   Specimen Description BLOOD RIGHT FOREARM  Final   Special Requests   Final    BOTTLES DRAWN AEROBIC AND ANAEROBIC Blood Culture results may not be optimal due to an inadequate volume of blood received in culture bottles   Culture   Final    NO GROWTH 5 DAYS Performed at [facility], [address], [place], [place] [address]    Report Status 06/04/2019 FINAL  Final  Body fluid culture  Status: None   Collection Time: 05/31/19 10:23 AM   Specimen: Body Fluid  Result Value Ref Range Status   Specimen Description FLUID  Final   Special  Requests KNEE  Final   Gram Stain   Final    ABUNDANT WBC PRESENT, PREDOMINANTLY PMN NO ORGANISMS SEEN    Culture   Final    FEW STAPHYLOCOCCUS AUREUS RESULT CALLED TO, READ BACK BY AND VERIFIED WITH: RN N HARVEY [phone] 1432 MLM Performed at [facility], [address] [place], [place] [address]    Report Status 06/02/2019 FINAL  Final   Organism ID, Bacteria STAPHYLOCOCCUS AUREUS  Final      Susceptibility   Staphylococcus aureus - MIC*    CIPROFLOXACIN <=0.5 SENSITIVE Sensitive     ERYTHROMYCIN >=8 RESISTANT Resistant     GENTAMICIN <=0.5 SENSITIVE Sensitive     OXACILLIN 0.5 SENSITIVE Sensitive     TETRACYCLINE <=1 SENSITIVE Sensitive     VANCOMYCIN 1 SENSITIVE Sensitive     TRIMETH/SULFA <=10 SENSITIVE Sensitive     CLINDAMYCIN RESISTANT Resistant     RIFAMPIN <=0.5 SENSITIVE Sensitive     Inducible Clindamycin POSITIVE Resistant     * FEW STAPHYLOCOCCUS AUREUS  Surgical pcr screen     Status: Abnormal   Collection Time: 05/31/19 10:34 AM   Specimen: Nasal Mucosa; Nasal Swab  Result Value Ref Range Status   MRSA, PCR NEGATIVE NEGATIVE Final   Staphylococcus aureus POSITIVE (A) NEGATIVE Final    Comment: (NOTE) The Xpert SA Assay (FDA approved for NASAL specimens in patients 81 years of age and older), is one component of a comprehensive surveillance program. It is not intended to diagnose infection nor to guide or monitor treatment. Performed at [facility], [address], [place], [place] [address]   Culture, blood (routine x 2)     Status: None (Preliminary result)   Collection Time: 06/01/19  5:30 AM   Specimen: BLOOD RIGHT HAND  Result Value Ref Range Status   Specimen Description BLOOD RIGHT HAND  Final   Special Requests   Final    BOTTLES DRAWN AEROBIC ONLY Blood Culture adequate volume   Culture   Final    NO GROWTH 4 DAYS Performed at [facility], [address], [place], [place] [address]    Report Status PENDING  Incomplete    Culture, blood (routine x 2)     Status: None (Preliminary result)   Collection Time: 06/01/19  5:50 AM   Specimen: BLOOD  Result Value Ref Range Status   Specimen Description BLOOD RIGHT ANTECUBITAL  Final   Special Requests   Final    BOTTLES DRAWN AEROBIC AND ANAEROBIC Blood Culture adequate volume   Culture   Final    NO GROWTH 4 DAYS Performed at [facility], [address] [place], [place] [address]    Report Status PENDING  Incomplete  Aerobic/Anaerobic Culture (surgical/deep wound)     Status: None (Preliminary result)   Collection Time: 06/04/19 10:04 AM   Specimen: Abscess  Result Value Ref Range Status   Specimen Description ABSCESS  Final   Special Requests NONE  Final   Gram Stain   Final    MODERATE WBC PRESENT, PREDOMINANTLY PMN FEW GRAM POSITIVE COCCI IN PAIRS IN CLUSTERS Performed at [facility], [address], [place], [place] [address]    Culture ABUNDANT STAPHYLOCOCCUS AUREUS  Final   Report Status PENDING  Incomplete  Rexene Alberts, MSN, NP-C [facility]  [email or web address] Pager: [phone] Office: [phone] [facility] Main Line: [phone]

## 2019-06-05 NOTE — Interval H&P Note (Signed)
History and Physical Interval Note:  06/05/2019 7:26 AM  Herbert Walsh  has presented today for surgery, with the diagnosis of BACTEREMIA.  The various methods of treatment have been discussed with the patient and family. After consideration of risks, benefits and other options for treatment, the patient has consented to  Procedure(s): TRANSESOPHAGEAL ECHOCARDIOGRAM (TEE) (N/A) as a surgical intervention.  The patient's history has been reviewed, patient examined, no change in status, stable for surgery.  I have reviewed the patient's chart and labs.  Questions were answered to the patient's satisfaction.     Olga Millers

## 2019-06-05 NOTE — Progress Notes (Signed)
Subjective:  Herbert Walsh is a 29 y.o. M with PMH of RA, HTN, HLD, PTSD admit for MSSA bacteremia on hospital day 6  Patient reports pain in right knee. Encouraged movement and discussed findings on exam are reassuring he is healing. Will put order in for PT/OT to help with strength and mobility. All questions and concerns addressed.    Objective:  Vital signs in last 24 hours: Vitals:   06/04/19 1022 06/04/19 1219 06/04/19 2229 06/05/19 0454  BP: (!) 149/99 136/81 (!) 155/78 139/70  Pulse: (!) 121 (!) 104 (!) 113 (!) 128  Resp: 20 20 18 17   Temp: 98.5 F (36.9 C) 98.5 F (36.9 C) 98.2 F (36.8 C) 99.8 F (37.7 C)  TempSrc:  Oral Oral Oral  SpO2: 95% 93% 94% 90%  Weight:      Height:        General: NAD, nl appearance HEENT: ROM intact, edema around right clavicle, antiicteric sclera  Cardiovascular: Normal rate, regular rhythm.  No murmurs, rubs, or gallops Pulmonary : Effort normal, breath sounds normal. No wheezes, rales, or rhonchi Abdominal: soft, nontender,  bowel sounds present Skin:erythema and fluctuation improved over R. Clavicle .Right leg incision intact , no pus or drainage ( see imaging)  Psychiatric/Behavioral:  normal mood, normal behavior      CBC Latest Ref Rng & Units 06/05/2019 06/04/2019 06/03/2019  WBC 4.0 - 10.5 K/uL 15.2(H) 16.7(H) 15.5(H)  Hemoglobin 13.0 - 17.0 g/dL 12.9(L) 11.4(L) 11.1(L)  Hematocrit 39.0 - 52.0 % 40.9 36.2(L) 35.3(L)  Platelets 150 - 400 K/uL 354 492(H) 434(H)   BMP Latest Ref Rng & Units 06/05/2019 06/04/2019 06/01/2019  Glucose 70 - 99 mg/dL 06/03/2019) 956(L) 875(I)  BUN 6 - 20 mg/dL 7 7 8   Creatinine 0.61 - 1.24 mg/dL 433(I 9.51  BUN/Creat Ratio 6 - 22 (calc) - - -  Sodium 135 - 145 mmol/L 133(L) 137 134(L)  Potassium 3.5 - 5.1 mmol/L 4.8 4.0 4.0  Chloride 98 - 111 mmol/L 99 103 105  CO2 22 - 32 mmol/L 20(L) 23 19(L)  Calcium 8.9 - 10.3 mg/dL 9.0 8.84) 1.66)    Assessment/Plan:  Principal Problem:   MSSA  bacteremia Active Problems:   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 M with PMH of RA, HTN, HLD, PTSD admit for MSSA bacteremia with septic arthritis.  MSSA Bacteremia R Knee septic arthritis RLE Myositis / Fascitis Sternoclavicular Septic Arthritis/Osteomyelitis Disseminated MSSA infection with multiple joint involvement. S/p R knee washout. CT surgery consulted IR for sternoclavicular joint aspiration. ID consulted. No signs of endocarditis on TTE or TEE.  Repeat blood cx on 4/18 NGTD @ 4days. Afebrile overnight. Sinus tachycardia, likely in setting of infection and patient's mother reports hx of fast heart rate without a diagnosis. .Currently on cefazolin. WBC trending down. Staph Aureus on [place] joint aspiration.   - Appreciate ortho, ct surg, ID recs, IR - C/w Cefazolin (Day 7 of abx) - F/u blood cultures - Trend cbc - Monitor vitals, new joint involvements - Percocet PRN for moderate pain and Dilaudid PRN for severe pain   PTSD/Intermittent Explosive Disorder C/w home meds: citalopram 40mg  daily, lithium 450mg /300mg  BID, Risperdal 4mg  qhs, Sertraline 50mg  daily,Trazadone 150 qhs   DVT prophx: Lovenox Diet: Carb modified Bowel: Miralax Code: Full  Dispo: Anticipated discharge in approximately 1-2 days  38  Court Joy, MD PGY1

## 2019-06-06 ENCOUNTER — Other Ambulatory Visit: Payer: Self-pay | Admitting: *Deleted

## 2019-06-06 ENCOUNTER — Inpatient Hospital Stay: Payer: Self-pay

## 2019-06-06 DIAGNOSIS — M0009 Staphylococcal polyarthritis: Secondary | ICD-10-CM

## 2019-06-06 DIAGNOSIS — M00011 Staphylococcal arthritis, right shoulder: Secondary | ICD-10-CM

## 2019-06-06 DIAGNOSIS — J90 Pleural effusion, not elsewhere classified: Secondary | ICD-10-CM

## 2019-06-06 DIAGNOSIS — Z95828 Presence of other vascular implants and grafts: Secondary | ICD-10-CM

## 2019-06-06 LAB — BASIC METABOLIC PANEL
Anion gap: 10 (ref 5–15)
BUN: 7 mg/dL (ref 6–20)
CO2: 23 mmol/L (ref 22–32)
Calcium: 9 mg/dL (ref 8.9–10.3)
Chloride: 101 mmol/L (ref 98–111)
Creatinine, Ser: 0.66 mg/dL (ref 0.61–1.24)
GFR calc Af Amer: >60 mL/min (ref >60)
GFR calc non Af Amer: >60 mL/min (ref >60)
Glucose, Bld: 141 mg/dL — ABNORMAL HIGH (ref 70–99)
Potassium: 4.6 mmol/L (ref 3.5–5.1)
Sodium: 134 mmol/L — ABNORMAL LOW (ref 135–145)

## 2019-06-06 LAB — CBC
HCT: 37.5 % — ABNORMAL LOW (ref 39.0–52.0)
Hemoglobin: 11.6 g/dL — ABNORMAL LOW (ref 13.0–17.0)
MCH: 23.9 pg — ABNORMAL LOW (ref 26.0–34.0)
MCHC: 30.9 g/dL (ref 30.0–36.0)
MCV: 77.2 fL — ABNORMAL LOW (ref 80.0–100.0)
Platelets: 557 10*3/uL — ABNORMAL HIGH (ref 150–400)
RBC: 4.86 MIL/uL (ref 4.22–5.81)
RDW: 16.7 % — ABNORMAL HIGH (ref 11.5–15.5)
WBC: 16.8 10*3/uL — ABNORMAL HIGH (ref 4.0–10.5)
nRBC: 0 % (ref 0.0–0.2)

## 2019-06-06 LAB — CULTURE, BLOOD (ROUTINE X 2)
Culture: NO GROWTH
Culture: NO GROWTH
Special Requests: ADEQUATE
Special Requests: ADEQUATE

## 2019-06-06 LAB — GLUCOSE, CAPILLARY
Glucose-Capillary: 133 mg/dL — ABNORMAL HIGH (ref 70–99)
Glucose-Capillary: 166 mg/dL — ABNORMAL HIGH (ref 70–99)

## 2019-06-06 MED ORDER — HYDROCODONE-ACETAMINOPHEN 5-300 MG PO TABS: 1.0000 | ORAL_TABLET | Freq: Four times a day (QID) | ORAL | 0 refills | Status: DC | PRN

## 2019-06-06 MED ORDER — HEPARIN SOD (PORK) LOCK FLUSH 100 UNIT/ML IV SOLN
250.0000 [IU] | INTRAVENOUS | Status: AC | PRN
  Administered 2019-06-06: 250 [IU]
  Filled 2019-06-06: qty 2.5

## 2019-06-06 MED ORDER — CEFAZOLIN IV (FOR PTA / DISCHARGE USE ONLY): 2.0000 g | Freq: Three times a day (TID) | INTRAVENOUS | 0 refills | Status: DC

## 2019-06-06 MED ORDER — CHLORHEXIDINE GLUCONATE CLOTH 2 % EX PADS: 6.0000 | MEDICATED_PAD | Freq: Every day | CUTANEOUS | Status: DC

## 2019-06-06 MED ORDER — SODIUM CHLORIDE 0.9% FLUSH: 10.0000 mL | INTRAVENOUS | Status: DC | PRN

## 2019-06-06 MED ORDER — HYDROCODONE-ACETAMINOPHEN 5-325 MG PO TABS: 1.0000 | ORAL_TABLET | Freq: Four times a day (QID) | ORAL | 0 refills | Status: AC | PRN

## 2019-06-06 NOTE — Progress Notes (Signed)
   Subjective:  Herbert Walsh is a 29 y.o. M with PMH of RA, HTN, HLD, PTSD admit for MSSA bacteremia on hospital day 7  Herbert Walsh was examined and evaluated at bedside this am. He states that his knee is sore bu has no acute complaints. He mentions he is able to 'hobble' to bedside chair independently but has not had formal evaluation with PT. He denies any fevers, chills overnight.   Objective:  Vital signs in last 24 hours: Vitals:   06/05/19 2204 06/06/19 0237 06/06/19 0330 06/06/19 0614  BP: (!) 147/83 (!) 150/93 (!) 145/88 133/86  Pulse: 98 (!) 123 (!) 110 (!) 119  Resp: 17 17 16 16   Temp: 98.5 F (36.9 C) 98.2 F (36.8 C) 98 F (36.7 C) 97.9 F (36.6 C)  TempSrc: Oral Oral Oral Oral  SpO2: 100% 100% 100% 96%  Weight:      Height:        General: NAD, nl appearance Cardiovascular: Tachycardic, regular rhythm.  No murmurs, rubs, or gallops Pulmonary : Effort normal, breath sounds normal. No wheezes, rales, or rhonchi Abdominal: soft, nontender,  bowel sounds present Ext: Right leg sutures intact, erythema improving. Non tender, non erythematous right [place] joint   CBC Latest Ref Rng & Units 06/06/2019 06/05/2019 06/04/2019  WBC 4.0 - 10.5 K/uL 16.8(H) 15.2(H) 16.7(H)  Hemoglobin 13.0 - 17.0 g/dL 11.6(L) 12.9(L) 11.4(L)  Hematocrit 39.0 - 52.0 % 37.5(L) 40.9 36.2(L)  Platelets 150 - 400 K/uL 557(H) 354 492(H)   BMP Latest Ref Rng & Units 06/06/2019 06/05/2019 06/05/2019  Glucose 70 - 99 mg/dL 06/07/2019) 093(G) 182(X)  BUN 6 - 20 mg/dL 7 8 7   Creatinine 0.61 - 1.24 mg/dL 937(J 6.96  BUN/Creat Ratio 6 - 22 (calc) - - -  Sodium 135 - 145 mmol/L 134(L) 132(L) 133(L)  Potassium 3.5 - 5.1 mmol/L 4.6 4.1 4.8  Chloride 98 - 111 mmol/L 101 98 99  CO2 22 - 32 mmol/L 23 22 20(L)  Calcium 8.9 - 10.3 mg/dL 9.0 7.89) 9.0    Assessment/Plan:  Principal Problem:   MSSA bacteremia Active Problems:   Septic arthritis of right sternoclavicular joint (HCC)   Rheumatoid arthritis  (HCC)   Septic arthritis of knee, right (HCC)   Myofascitis   Prediabetes  Herbert Walsh is a 29 y.o. M with PMH of RA, HTN, HLD, PTSD admit for MSSA bacteremia with septic arthritis.  MSSA Bacteremia R Knee and Sternoclavicular septic arthritis RLE Myositis / Fascitis Disseminated MSSA infection with right knee and right [place] joint envolvement. S/p R knee washout. IR aspirated sternoclavicular joint and it grew staph aureus. No signs of endocarditis on TTE or TEE. ID consulted and have arrange 6 weeks of home health administered antibiotics. PICC line place 4/23. Weekly labs faxed to ID at [phone]. Follow up with CT surgery for chest CT - Appreciate ortho, ct surg, ID recs, IR - C/w Cefazolin (Day 8 of abx), stop date 07/13/19.OPAT orders pended - F/u blood cultures - Percocet PRN for moderate pain - Stop IV dilaudid    PTSD/Intermittent Explosive Disorder C/w home meds: citalopram 40mg  daily, lithium 450mg /300mg  BID, Risperdal 4mg  qhs, Sertraline 50mg  daily,Trazadone 150 qhs   DVT prophx: Lovenox Diet: Carb modified Bowel: Miralax Code: Full  Dispo: Anticipated discharge today.   [phone], MD PGY1

## 2019-06-06 NOTE — Evaluation (Signed)
Occupational Therapy Evaluation Patient Details Name: Herbert Walsh MRN: [id number] DOB: 1991/01/13 Today's Date: 06/06/2019    History of Present Illness Pt is a 29 y.o. 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 PMH includes HTN, arthritis.   Clinical Impression   Pt admitted with the above diagnoses and presents with below problem list. Pt will benefit from continued acute OT to address the below listed deficits and maximize independence with basic ADLs prior to d/c home. PTA pt was independent with basic ADLs. Lives with his mother who is currently home 24/7 due to recent eye surgery per pt report. Pt is currently min guard with LB ADLs and bathroom distance functional mobility utilizing rw. Pt reluctant to bear weight through RLE and tends to hold in NWB position. Educated on WBAT status, technique with rw to offload, and risks of over-immobilizing RLE. Pt reports he is eager to d/c home today.     Follow Up Recommendations  Home health OT;Supervision - Intermittent(OOB/mobility)    Equipment Recommendations  3 in 1 bedside commode    Recommendations for Other Services PT consult     Precautions / Restrictions Precautions Precautions: Fall Restrictions Weight Bearing Restrictions: Yes RLE Weight Bearing: Weight bearing as tolerated      Mobility Bed Mobility Overal bed mobility: Modified Independent                Transfers Overall transfer level: Needs assistance Equipment used: Rolling walker (2 wheeled) Transfers: Sit to/from Stand Sit to Stand: Supervision;Min guard         General transfer comment: to/from EOB. mostly supervision but 1x min guard to control descent at end of session.     Balance Overall balance assessment: Needs assistance Sitting-balance support: No upper extremity  supported;Feet supported Sitting balance-Leahy Scale: Good     Standing balance support: Bilateral upper extremity supported Standing balance-Leahy Scale: Poor Standing balance comment: needs external support in standing                           ADL either performed or assessed with clinical judgement   ADL Overall ADL's : Needs assistance/impaired Eating/Feeding: Set up;Sitting   Grooming: Set up;Sitting;Min guard;Standing   Upper Body Bathing: Set up;Sitting   Lower Body Bathing: Min guard;Sit to/from stand   Upper Body Dressing : Set up;Sitting   Lower Body Dressing: Min guard;Sit to/from stand   Toilet Transfer: Min guard;RW;Ambulation   Toileting- Water quality scientist and Hygiene: Min guard;Sit to/from stand;Sitting/lateral lean   Tub/ Shower Transfer: Walk-in shower;Min guard;Ambulation;Rolling walker;3 in 1   Functional mobility during ADLs: Min guard;Rolling walker General ADL Comments: Pt completed in room functional mobility and practiced weight shifting onto RLE in static stand utilizing rw. Pt reluctant to WB through RLE, discussed importance of not over immobilizing RLE and current WBAT status.      Vision         Perception     Praxis      Pertinent Vitals/Pain Pain Assessment: Faces Faces Pain Scale: Hurts little more Pain Location: R knee Pain Descriptors / Indicators: Guarding;Sore Pain Intervention(s): Limited activity within patient's tolerance;Monitored during session;Repositioned     Hand Dominance     Extremity/Trunk Assessment Upper Extremity Assessment Upper Extremity Assessment: Overall WFL for tasks assessed   Lower Extremity Assessment Lower Extremity Assessment: Defer to PT evaluation  Communication Communication Communication: No difficulties   Cognition Arousal/Alertness: Awake/alert Behavior During Therapy: Flat affect Overall Cognitive Status: No family/caregiver present to determine baseline cognitive  functioning                                 General Comments: decreased problem solving. appears internally distracted, taciturn.    General Comments       Exercises     Shoulder Instructions      Home Living Family/patient expects to be discharged to:: Private residence Living Arrangements: Parent Available Help at Discharge: Family;Available 24 hours/day Type of Home: Mobile home Home Access: Stairs to enter Entrance Stairs-Number of Steps: 4+1+1 Entrance Stairs-Rails: Right;Left;Can reach both Home Layout: One level     Bathroom Shower/Tub: Producer, television/film/video: Standard     Home Equipment: None          Prior Functioning/Environment Level of Independence: Independent        Comments: I with basic ADLs        OT Problem List: Decreased strength;Decreased activity tolerance;Impaired balance (sitting and/or standing);Decreased knowledge of use of DME or AE;Decreased knowledge of precautions;Pain      OT Treatment/Interventions: Self-care/ADL training;Therapeutic exercise;Energy conservation;DME and/or AE instruction;Therapeutic activities;Patient/family education;Balance training    OT Goals(Current goals can be found in the care plan section) Acute Rehab OT Goals Patient Stated Goal: home today OT Goal Formulation: With patient Time For Goal Achievement: 06/20/19 Potential to Achieve Goals: Good ADL Goals Pt Will Perform Grooming: with modified independence;standing Pt Will Perform Lower Body Bathing: with modified independence;sit to/from stand Pt Will Perform Lower Body Dressing: with modified independence;sit to/from stand Pt Will Transfer to Toilet: with modified independence;ambulating Pt Will Perform Toileting - Clothing Manipulation and hygiene: with modified independence;sit to/from stand Pt Will Perform Tub/Shower Transfer: Shower transfer;ambulating;3 in 1;rolling walker  OT Frequency: Min 2X/week   Barriers to D/C:             Co-evaluation              AM-PAC OT "6 Clicks" Daily Activity     Outcome Measure Help from another person eating meals?: None Help from another person taking care of personal grooming?: None Help from another person toileting, which includes using toliet, bedpan, or urinal?: A Little Help from another person bathing (including washing, rinsing, drying)?: A Little Help from another person to put on and taking off regular upper body clothing?: None Help from another person to put on and taking off regular lower body clothing?: A Little 6 Click Score: 21   End of Session Equipment Utilized During Treatment: Rolling walker Nurse Communication: Mobility status  Activity Tolerance: Patient tolerated treatment well;Other (comment)(appeared to become mildly agitated end of session "I'm done") Patient left: in bed;with call bell/phone within reach;with bed alarm set  OT Visit Diagnosis: Unsteadiness on feet (R26.81);Pain Pain - Right/Left: Right Pain - part of body: Knee                Time: 1220-1240 OT Time Calculation (min): 20 min Charges:  OT General Charges $OT Visit: 1 Visit OT Evaluation $OT Eval Low Complexity: 1 Low  Raynald Kemp, OT Acute Rehabilitation Services Pager: [phone] Office: [phone]   Pilar Grammes 06/06/2019, 12:57 PM

## 2019-06-06 NOTE — Evaluation (Signed)
Physical Therapy Evaluation & Discharge Patient Details Name: Herbert Walsh MRN: [id number] DOB: [birth year] Today's Date: 06/06/2019   History of Present Illness  Pt is a 29 y.o. male admitted 05/30/19 with MSSA bacteremia complicated by R knee septic arthritis (s/p I&D 4/17) and R [place] joint septic effusion/sternocleidomastoid muscle abscess (s/p sternoclavicular joint aspiration 4/20). PMH includes HTN, arthritis.    Clinical Impression  Patient evaluated by Physical Therapy with no further acute PT needs identified. PTA, pt independent and lives with mother. Today, pt moving well with RW, although limited by R knee pain. Educ re: precautions, positioning/edema control, therex (HEP provided), importance of mobility. All education has been completed and the patient has no further questions. Acute PT is signing off. Thank you for this referral.    Follow Up Recommendations Home health PT;Supervision - Intermittent    Equipment Recommendations  Rolling walker with 5" wheels;3in1 (PT)    Recommendations for Other Services       Precautions / Restrictions Precautions Precautions: Fall Restrictions Weight Bearing Restrictions: Yes RLE Weight Bearing: Weight bearing as tolerated      Mobility  Bed Mobility Overal bed mobility: Independent                Transfers Overall transfer level: Needs assistance Equipment used: Rolling walker (2 wheeled) Transfers: Sit to/from Stand Sit to Stand: Supervision         General transfer comment: to/from EOB. mostly supervision but 1x min guard to control descent at end of session.   Ambulation/Gait Ambulation/Gait assistance: Supervision Gait Distance (Feet): 40 Feet Assistive device: Rolling walker (2 wheeled) Gait Pattern/deviations: Step-to pattern;Antalgic;Decreased weight shift to right Gait velocity: Decreased Gait velocity interpretation: <1.8 ft/sec, indicate of risk for recurrent falls General Gait Details: Slow, antalgic  gait with RW at supervision-level; pt hesitant with minimal WB thru RLE due to pain; able to touch heel to ground, encouraged this stretch and WBAT; declined crutches  Stairs Stairs: (Pt declined stair training; educ pt and mom on technique with bilateral rails or BUE support on R-side rail)          Wheelchair Mobility    Modified Rankin (Stroke Patients Only)       Balance Overall balance assessment: Needs assistance Sitting-balance support: No upper extremity supported;Feet supported Sitting balance-Leahy Scale: Good     Standing balance support: Bilateral upper extremity supported Standing balance-Leahy Scale: Fair Standing balance comment: Can static stand without UE support; stability improved with RW                             Pertinent Vitals/Pain Pain Assessment: Faces Faces Pain Scale: Hurts even more Pain Location: R knee Pain Descriptors / Indicators: Guarding;Sore Pain Intervention(s): Monitored during session;Limited activity within patient's tolerance    Home Living Family/patient expects to be discharged to:: Private residence Living Arrangements: Parent Available Help at Discharge: Family;Available 24 hours/day Type of Home: Mobile home Home Access: Stairs to enter Entrance Stairs-Rails: Right;Left;Can reach both Entrance Stairs-Number of Steps: 4+1+1 Home Layout: One level Home Equipment: None      Prior Function Level of Independence: Independent         Comments: Independent, primarily sedentary     Hand Dominance        Extremity/Trunk Assessment   Upper Extremity Assessment Upper Extremity Assessment: RUE deficits/detail RUE Deficits / Details: s/p R sternoclavicular joint aspiration, R shoulder moving well although painful/uncomfortable; strength WFL    Lower  Extremity Assessment Lower Extremity Assessment: RLE deficits/detail RLE Deficits / Details: s/p R knee I&D, able to tolerate minimal WB; quad activating,  but cannot tolerate SLR; knee flex limited to ~45' (by pain and ace wrap) RLE: Unable to fully assess due to immobilization;Unable to fully assess due to pain RLE Coordination: decreased gross motor       Communication   Communication: No difficulties  Cognition Arousal/Alertness: Awake/alert Behavior During Therapy: Flat affect Overall Cognitive Status: Within Functional Limits for tasks assessed                                 General Comments: WFL for simple tasks although flat and seems distracted by pain/focused on d/c home; taciturn      General Comments General comments (skin integrity, edema, etc.): Back present and supportive    Exercises Other Exercises Other Exercises: Heel slide with strap, SLR, gastroc stretch with strap, LAQ ([employer] Access Code #[email or web address])   Assessment/Plan    PT Assessment All further PT needs can be met in the next venue of care  PT Problem List Decreased strength;Decreased range of motion;Decreased activity tolerance;Decreased balance;Decreased mobility;Decreased knowledge of use of DME;Pain;Decreased knowledge of precautions       PT Treatment Interventions      PT Goals (Current goals can be found in the Care Plan section)  Acute Rehab PT Goals Patient Stated Goal: home today PT Goal Formulation: All assessment and education complete, DC therapy    Frequency     Barriers to discharge        Co-evaluation               AM-PAC PT "6 Clicks" Mobility  Outcome Measure Help needed turning from your back to your side while in a flat bed without using bedrails?: None Help needed moving from lying on your back to sitting on the side of a flat bed without using bedrails?: None Help needed moving to and from a bed to a chair (including a wheelchair)?: None Help needed standing up from a chair using your arms (e.g., wheelchair or bedside chair)?: None Help needed to walk in hospital room?: A Little Help needed climbing  3-5 steps with a railing? : A Little 6 Click Score: 22    End of Session   Activity Tolerance: Patient tolerated treatment well;Patient limited by pain Patient left: in bed;with call bell/phone within reach;with family/visitor present Nurse Communication: Mobility status PT Visit Diagnosis: Other abnormalities of gait and mobility (R26.89);Pain Pain - Right/Left: Right Pain - part of body: Knee    Time: 9233-0076 PT Time Calculation (min) (ACUTE ONLY): 19 min   Charges:   PT Evaluation $PT Eval Low Complexity: 1 Low     Mabeline Caras, PT, DPT Acute Rehabilitation Services  Pager [phone] Office [phone]  Derry Lory 06/06/2019, 2:53 PM

## 2019-06-06 NOTE — Progress Notes (Signed)
[facility] Becker to be D/C'd  per MD order. Discussed with the patient and all questions fully answered.  VSS, Skin clean, dry and intact without evidence of skin break down, no evidence of skin tears noted.  IV catheter discontinued intact. Site without signs and symptoms of complications. Dressing and pressure applied.  An After Visit Summary was printed and given to the patient. Patient received prescription.  D/c education completed with patient/family including follow up instructions, medication list, d/c activities limitations if indicated, with other d/c instructions as indicated by MD - patient able to verbalize understanding, all questions fully answered.   Patient instructed to return to ED, call 911, or call MD for any changes in condition.   Patient to be escorted via WC, and D/C home via private auto.

## 2019-06-06 NOTE — TOC Progression Note (Signed)
Transition of Care [facility]) - Progression Note    Patient Details  Name: Herbert Walsh MRN: [id number] Date of Birth: December 22, 1990  [facility] CM/SW Contact  Nadene Rubins Adria Devon, RN Phone Number: 06/06/2019, 1:13 PM  Clinical Narrative:     Patient for discharge today.   Pam with [facility] has arranged home health Rn with [facility].   Pam will provide teaching to patient and his mother prior to discharge today.   Ordered walker and 3 in1 with Betsy with [facility]   Expected Discharge Plan: Home w Home Health Services Barriers to Discharge: Continued Medical Work up  Expected Discharge Plan and Services Expected Discharge Plan: Home w Home Health Services   Discharge Planning Services: CM Consult   Living arrangements for the past 2 months: Single Family Home                   DME Agency: NA       HH Arranged: RN           Social Determinants of Health (SDOH) Interventions    Readmission Risk Interventions No flowsheet data found.

## 2019-06-06 NOTE — Discharge Summary (Signed)
Name: Herbert Walsh MRN: [id number] DOB: 1990-04-15 29 y.o. PCP: Glenda Chroman, MD  Date of Admission: 05/30/2019 10:31 AM Date of Discharge: 06/06/2019 Attending Physician: Sid Falcon, MD  Discharge Diagnosis: 1. MSSA Bacteremia 2. Right Knee and Sternoclavicular septic arthritis   Discharge Medications: Allergies as of 06/06/2019   No Known Allergies     Medication List    TAKE these medications   atorvastatin 10 MG tablet Commonly known as: LIPITOR Take 10 mg by mouth daily.   ceFAZolin  IVPB Commonly known as: ANCEF Inject 2 g into the vein every 8 (eight) hours. Indication:  MSSA bacteremia and sternoclavicular joint osteomyelitis First Dose: Yes Last Day of Therapy:  07/13/2019 Labs - Once weekly:  CBC/D and BMP, Labs - Every other week:  ESR and CRP Method of administration: IV Push Method of administration may be changed at the discretion of home infusion pharmacist based upon assessment of the patient and/or caregiver's ability to self-administer the medication ordered.   citalopram 40 MG tablet Commonly known as: CELEXA Take 1 tablet (40 mg total) by mouth daily.   cloNIDine 0.2 MG tablet Commonly known as: CATAPRES Take 1 tablet (0.2 mg total) by mouth 3 (three) times daily. Start taking on: Jun 16, 2019   cyclobenzaprine 10 MG tablet Commonly known as: FLEXERIL Take 1 tablet (10 mg total) by mouth 3 (three) times daily as needed for muscle spasms.   HYDROcodone-acetaminophen 5-325 MG tablet Commonly known as: NORCO/VICODIN Take 1-2 tablets by mouth every 6 (six) hours as needed for pain. for pain   lisinopril 10 MG tablet Commonly known as: ZESTRIL Take 10 mg by mouth daily.   lithium carbonate 450 MG CR tablet Commonly known as: ESKALITH Total of 750 mg daily (400 mg + 300 mg) Start taking on: Jun 16, 2019 What changed:   how much to take  how to take this  when to take this  additional instructions  Another medication with the same  name was removed. Continue taking this medication, and follow the directions you see here.   naproxen 250 MG tablet Commonly known as: NAPROSYN Take 2 po BID with food prn pain   omeprazole 40 MG capsule Commonly known as: PRILOSEC Take 40 mg by mouth daily.   predniSONE 5 MG tablet Commonly known as: DELTASONE Take 10-60 mg by mouth as directed. Taper dose ('50mg'$  today)   risperidone 4 MG tablet Commonly known as: RISPERDAL Take 1 tablet (4 mg total) by mouth at bedtime. Start taking on: Jun 16, 2019   sertraline 50 MG tablet Commonly known as: Zoloft 25 mg daily for one week, then 50 mg daily What changed:   how much to take  how to take this  when to take this   traMADol 50 MG tablet Commonly known as: ULTRAM Take 50 mg by mouth every 8 (eight) hours as needed for pain.   traZODone 150 MG tablet Commonly known as: DESYREL Take 1 tablet (150 mg total) by mouth at bedtime. Start taking on: Jun 16, 2019            Durable Medical Equipment  (From admission, onward)         Start     Ordered   06/06/19 1312  For home use only DME 3 n 1  Once     06/06/19 1312   06/06/19 1312  For home use only DME Walker rolling  Once    Question Answer Comment  Walker: With  [place]   Patient needs a walker to treat with the following condition Weakness      06/06/19 1312           Discharge Care Instructions  (From admission, onward)         Start     Ordered   06/06/19 0000  Change dressing on IV access line weekly and PRN  (Home infusion instructions - [facility] )     06/06/19 1318          Disposition and follow-up:   Herbert Walsh was discharged from [facility] in Stable condition.  At the hospital follow up visit please address:  1.    MSSA Bacteremia R Knee and Sternoclavicular septic arthritis  - insure patient follows up with Orthopedics, ID, and CT - weekly labs faxed to ID at [phone] - Cefazolin for  6 weeks, stop date 07/13/19. Assess for continued improvement.  2.  Labs / imaging needed at time of follow-up:   3.  Pending labs/ test needing follow-up:   Follow-up Appointments: Follow-up Information    Leandrew Koyanagi, MD. Schedule an appointment as soon as possible for a visit in 2 week(s).   Specialty: Orthopedic Surgery Contact information: [place] [address] [phone]        Thayer Headings, MD Follow up on 06/24/2019.   Specialty: Infectious Diseases Why: 9:45 am appointment. Please plan to arrive 15 minutes early to check in or e-check in before the appointment.  Contact information: [address] [place] [address] [phone]        Glenda Chroman, MD. Schedule an appointment as soon as possible for a visit in 2 week(s).   Specialty: Internal Medicine Contact information: [place] [address] [place] Hospital Course by problem list: 1.  MSSA Bacteremia R Knee and Sternoclavicular septic arthritis RLE Myositis / Fascitis Disseminated MSSA infection with right knee and right Hanna joint envolvement. S/p R knee washout. IR aspirated sternoclavicular joint and it grew staph aureus. No signs of endocarditis on TTE or TEE. ID consulted and have arrange 6 weeks of home health administered antibiotics. Cefazolin (Day 8 of abx), stop date 07/13/19.OPAT orders place and PICC line placed 4/23. Weekly labs faxed to ID at [phone]. Follow up with CT surgery for chest CT   PTSD/Intermittent Explosive Disorder C/w home meds: citalopram '40mg'$  daily, lithium '450mg'$ /'300mg'$  BID, Risperdal '4mg'$  qhs, Sertraline '50mg'$  daily,Trazadone 150 qhs   Discharge Vitals:   BP 133/86 (BP Location: Left Arm)   Pulse (!) 119   Temp 97.9 F (36.6 C) (Oral)   Resp 16   Ht 6' (1.829 m)   Wt 127 kg   SpO2 96%   BMI 37.97 kg/m   Pertinent Labs, Studies, and Procedures:  CBC Latest Ref Rng & Units 06/06/2019 06/05/2019  06/04/2019  WBC 4.0 - 10.5 K/uL 16.8(H) 15.2(H) 16.7(H)  Hemoglobin 13.0 - 17.0 g/dL 11.6(L) 12.9(L) 11.4(L)  Hematocrit 39.0 - 52.0 % 37.5(L) 40.9 36.2(L)  Platelets 150 - 400 K/uL 557(H) 354 492(H)    BMP Latest Ref Rng & Units 06/06/2019 06/05/2019 06/05/2019  Glucose 70 - 99 mg/dL 141(H) 161(H) 125(H)  BUN 6 - 20 mg/dL '7 8 7  '$ Creatinine 0.61 - 1.24 mg/dL 0.66 0.66 0.64  BUN/Creat Ratio 6 - 22 (calc) - - -  Sodium 135 - 145 mmol/L 134(L) 132(L) 133(L)  Potassium 3.5 - 5.1 mmol/L 4.6 4.1 4.8  Chloride 98 - 111 mmol/L 101 98 99  CO2 22 - 32 mmol/L 23 22 20(L)  Calcium 8.9 - 10.3 mg/dL 9.0 8.6(L) 9.0      Discharge Instructions: Discharge Instructions    [facility] pharmacist to adjust dose for Vancomycin, Aminoglycosides and other anti-infective therapies as requested by physician.   Complete by: As directed    [facility] to provide Cath Flo '2mg'$    Complete by: As directed    Administer for PICC line occlusion and as ordered by physician for other access device issues.   Anaphylaxis Kit: Provided to treat any anaphylactic reaction to the medication being provided to the patient if First Dose or when requested by physician   Complete by: As directed    Epinephrine '1mg'$ /ml vial / amp: Administer 0.'3mg'$  (0.48m) subcutaneously once for moderate to severe anaphylaxis, nurse to call physician and pharmacy when reaction occurs and call 911 if needed for immediate care   Diphenhydramine '50mg'$ /ml IV vial: Administer 25-'50mg'$  IV/IM PRN for first dose reaction, rash, itching, mild reaction, nurse to call physician and pharmacy when reaction occurs   Sodium Chloride 0.9% NS 5038mIV: Administer if needed for hypovolemic blood pressure drop or as ordered by physician after call to physician with anaphylactic reaction   Call MD for:  redness, tenderness, or signs of infection (pain, swelling, redness, odor or green/yellow discharge around incision site)   Complete by: As directed     Call MD for:  temperature >100.4   Complete by: As directed    Change dressing on IV access line weekly and PRN   Complete by: As directed    Diet - low sodium heart healthy   Complete by: As directed    Flush IV access with Sodium Chloride 0.9% and Heparin 10 units/ml or 100 units/ml   Complete by: As directed    Home infusion instructions - [facility]   Complete by: As directed    Instructions: Flush IV access with Sodium Chloride 0.9% and Heparin 10units/ml or 100units/ml   Change dressing on IV access line: Weekly and PRN   Instructions Cath Flo '2mg'$ : Administer for PICC Line occlusion and as ordered by physician for other access device   [facility] pharmacist to adjust dose for: Vancomycin, Aminoglycosides and other anti-infective therapies as requested by physician   Increase activity slowly   Complete by: As directed    Method of administration may be changed at the discretion of home infusion pharmacist based upon assessment of the patient and/or caregiver's ability to self-administer the medication ordered   Complete by: As directed       Signed:  JeTamsen SniderMD PGY1

## 2019-06-06 NOTE — Discharge Instructions (Signed)
° °  Postoperative instructions:  Weightbearing instructions: WBAT RLE  Dressing instructions: Keep your dressing and/or splint clean and dry at all times.  It will be removed at your first post-operative appointment.  Your stitches and/or staples will be removed at this visit.  Incision instructions:  Do not soak your incision for 3 weeks after surgery.  If the incision gets wet, pat dry and do not scrub the incision.  Pain control:  You have been given a prescription to be taken as directed for post-operative pain control.  In addition, elevate the operative extremity above the heart at all times to prevent swelling and throbbing pain.  Take over-the-counter Colace, 100mg  by mouth twice a day while taking narcotic pain medications to help prevent constipation.  Follow up appointments: 1) 2 weeks for suture removal and wound check. 2) Dr. as scheduled.   -------------------------------------------------------------------------------------------------------------  After Surgery Pain Control:  After your surgery, post-surgical discomfort or pain is likely. This discomfort can last several days to a few weeks. At certain times of the day your discomfort may be more intense.  Did you receive a nerve block?  A nerve block can provide pain relief for one hour to two days after your surgery. As long as the nerve block is working, you will experience little or no sensation in the area the surgeon operated on.  As the nerve block wears off, you will begin to experience pain or discomfort. It is very important that you begin taking your prescribed pain medication before the nerve block fully wears off. Treating your pain at the first sign of the block wearing off will ensure your pain is better controlled and more tolerable when full-sensation returns. Do not wait until the pain is intolerable, as the medicine will be less effective. It is better to treat pain in advance than to try and catch up.    General Anesthesia:  If you did not receive a nerve block during your surgery, you will need to start taking your pain medication shortly after your surgery and should continue to do so as prescribed by your surgeon.  Pain Medication:  Most commonly we prescribe Vicodin and Percocet for post-operative pain. Both of these medications contain a combination of acetaminophen (Tylenol) and a narcotic to help control pain.   It takes between 30 and 45 minutes before pain medication starts to work. It is important to take your medication before your pain level gets too intense.   Nausea is a common side effect of many pain medications. You will want to eat something before taking your pain medicine to help prevent nausea.   If you are taking a prescription pain medication that contains acetaminophen, we recommend that you do not take additional over the counter acetaminophen (Tylenol).  Other pain relieving options:   Using a cold pack to ice the affected area a few times a day (15 to 20 minutes at a time) can help to relieve pain, reduce swelling and bruising.   Elevation of the affected area can also help to reduce pain and swelling.   Thank you for trusting me with your care. To recap you were found to have an infection in your blood. The same infection was in your right chest and right leg. Home health has been arranged to administer IV antibiotics.  Providers to follow-up with are listed on your discharge.  My best,  Roda Shutters

## 2019-06-06 NOTE — Progress Notes (Signed)
[facility] for Infectious Disease   Reason for visit: Follow up on MSSA bacteremia  Interval History: repeat blood cultures ngtd.  PICC line placed.  Afebrile. WBC stable.    Physical Exam: Constitutional:  Vitals:   06/06/19 0330 06/06/19 0614  BP: (!) 145/88 133/86  Pulse: (!) 110 (!) 119  Resp: 16 16  Temp: 98 F (36.7 C) 97.9 F (36.6 C)  SpO2: 100% 96%   patient appears in NAD Respiratory: Normal respiratory effort; CTA B Cardiovascular: RRR GI: soft, nt, nd  Review of Systems: Constitutional: negative for fevers and chills Gastrointestinal: negative for diarrhea  Lab Results  Component Value Date   WBC 16.8 (H) 06/06/2019   HGB 11.6 (L) 06/06/2019   HCT 37.5 (L) 06/06/2019   MCV 77.2 (L) 06/06/2019   PLT 557 (H) 06/06/2019    Lab Results  Component Value Date   CREATININE 0.66 06/06/2019   BUN 7 06/06/2019   NA 134 (L) 06/06/2019   K 4.6 06/06/2019   CL 101 06/06/2019   CO2 23 06/06/2019    Lab Results  Component Value Date   ALT 22 05/31/2019   AST 29 05/31/2019   ALKPHOS 85 05/31/2019     Microbiology: Recent Results (from the past 240 hour(s))  Urine culture     Status: None   Collection Time: 05/30/19  3:13 PM   Specimen: In/Out Cath Urine  Result Value Ref Range Status   Specimen Description IN/OUT CATH URINE  Final   Special Requests NONE  Final   Culture   Final    NO GROWTH Performed at [facility], [address] [place], [place] [address]    Report Status 05/31/2019 FINAL  Final  SARS CORONAVIRUS 2 (TAT 6-24 HRS) Nasopharyngeal Nasopharyngeal Swab     Status: None   Collection Time: 05/30/19  3:14 PM   Specimen: Nasopharyngeal Swab  Result Value Ref Range Status   SARS Coronavirus 2 NEGATIVE NEGATIVE Final    Comment: (NOTE) SARS-CoV-2 target nucleic acids are NOT DETECTED. The SARS-CoV-2 RNA is generally detectable in upper and lower respiratory specimens during the acute phase of infection. Negative results do  not preclude SARS-CoV-2 infection, do not rule out co-infections with other pathogens, and should not be used as the sole basis for treatment or other patient management decisions. Negative results must be combined with clinical observations, patient history, and epidemiological information. The expected result is Negative. Fact Sheet for Patients: SugarRoll.be Fact Sheet for Healthcare Providers: https://www.woods-mathews.com/ This test is not yet approved or cleared by the [place] FDA and  has been authorized for detection and/or diagnosis of SARS-CoV-2 by FDA under an Emergency Use Authorization (EUA). This EUA will remain  in effect (meaning this test can be used) for the duration of the COVID-19 declaration under Section 56 4(b)(1) of the Act, 21 U.S.C. section 360bbb-3(b)(1), unless the authorization is terminated or revoked sooner. Performed at [facility], [place] [address] [place], [place] [address]   Blood Culture (routine x 2)     Status: Abnormal   Collection Time: 05/30/19  4:35 PM   Specimen: BLOOD  Result Value Ref Range Status   Specimen Description BLOOD SITE NOT SPECIFIED  Final   Special Requests   Final    BOTTLES DRAWN AEROBIC AND ANAEROBIC Blood Culture results may not be optimal due to an inadequate volume of blood received in culture bottles   Culture  Setup Time   Final  GRAM POSITIVE COCCI IN CLUSTERS IN BOTH AEROBIC AND ANAEROBIC BOTTLES CRITICAL RESULT CALLED TO, READ BACK BY AND VERIFIED WITH: Milford Cage [id number] MLM Performed at [facility], [place] [address] [place], [place] [address]    Culture STAPHYLOCOCCUS AUREUS (A)  Final   Report Status 06/02/2019 FINAL  Final   Organism ID, Bacteria STAPHYLOCOCCUS AUREUS  Final      Susceptibility   Staphylococcus aureus - MIC*    CIPROFLOXACIN <=0.5 SENSITIVE Sensitive     ERYTHROMYCIN RESISTANT Resistant     GENTAMICIN <=0.5 SENSITIVE  Sensitive     OXACILLIN 0.5 SENSITIVE Sensitive     TETRACYCLINE <=1 SENSITIVE Sensitive     VANCOMYCIN 1 SENSITIVE Sensitive     TRIMETH/SULFA <=10 SENSITIVE Sensitive     CLINDAMYCIN RESISTANT Resistant     RIFAMPIN <=0.5 SENSITIVE Sensitive     Inducible Clindamycin POSITIVE Resistant     * STAPHYLOCOCCUS AUREUS  Blood Culture ID Panel (Reflexed)     Status: Abnormal   Collection Time: 05/30/19  4:35 PM  Result Value Ref Range Status   Enterococcus species NOT DETECTED NOT DETECTED Final   Listeria monocytogenes NOT DETECTED NOT DETECTED Final   Staphylococcus species DETECTED (A) NOT DETECTED Final    Comment: CRITICAL RESULT CALLED TO, READ BACK BY AND VERIFIED WITH: PHARMD T BAUMEISTER [id number] MLM    Staphylococcus aureus (BCID) DETECTED (A) NOT DETECTED Final    Comment: Methicillin (oxacillin) susceptible Staphylococcus aureus (MSSA). Preferred therapy is anti staphylococcal beta lactam antibiotic (Cefazolin or Nafcillin), unless clinically contraindicated. CRITICAL RESULT CALLED TO, READ BACK BY AND VERIFIED WITH: PHARMD T BAUMEISTER [id number] MLM    Methicillin resistance NOT DETECTED NOT DETECTED Final   Streptococcus species NOT DETECTED NOT DETECTED Final   Streptococcus agalactiae NOT DETECTED NOT DETECTED Final   Streptococcus pneumoniae NOT DETECTED NOT DETECTED Final   Streptococcus pyogenes NOT DETECTED NOT DETECTED Final   Acinetobacter baumannii NOT DETECTED NOT DETECTED Final   Enterobacteriaceae species NOT DETECTED NOT DETECTED Final   Enterobacter cloacae complex NOT DETECTED NOT DETECTED Final   Escherichia coli NOT DETECTED NOT DETECTED Final   Klebsiella oxytoca NOT DETECTED NOT DETECTED Final   Klebsiella pneumoniae NOT DETECTED NOT DETECTED Final   Proteus species NOT DETECTED NOT DETECTED Final   Serratia marcescens NOT DETECTED NOT DETECTED Final   Haemophilus influenzae NOT DETECTED NOT DETECTED Final   Neisseria meningitidis NOT DETECTED NOT  DETECTED Final   Pseudomonas aeruginosa NOT DETECTED NOT DETECTED Final   Candida albicans NOT DETECTED NOT DETECTED Final   Candida glabrata NOT DETECTED NOT DETECTED Final   Candida krusei NOT DETECTED NOT DETECTED Final   Candida parapsilosis NOT DETECTED NOT DETECTED Final   Candida tropicalis NOT DETECTED NOT DETECTED Final    Comment: Performed at [facility], [place] [address] [place], [place] [address]  Respiratory Panel by RT PCR (Flu A&B, Covid) - Nasopharyngeal Swab     Status: None   Collection Time: 05/30/19  5:07 PM   Specimen: Nasopharyngeal Swab  Result Value Ref Range Status   SARS Coronavirus 2 by RT PCR NEGATIVE NEGATIVE Final    Comment: (NOTE) SARS-CoV-2 target nucleic acids are NOT DETECTED. The SARS-CoV-2 RNA is generally detectable in upper respiratoy specimens during the acute phase of infection. The lowest concentration of SARS-CoV-2 viral copies this assay can detect is 131 copies/mL. A negative result does not preclude SARS-Cov-2 infection and should not be used as  the sole basis for treatment or other patient management decisions. A negative result may occur with  improper specimen collection/handling, submission of specimen other than nasopharyngeal swab, presence of viral mutation(s) within the areas targeted by this assay, and inadequate number of viral copies (<131 copies/mL). A negative result must be combined with clinical observations, patient history, and epidemiological information. The expected result is Negative. Fact Sheet for Patients:  PinkCheek.be Fact Sheet for Healthcare Providers:  GravelBags.it This test is not yet ap proved or cleared by the [place] FDA and  has been authorized for detection and/or diagnosis of SARS-CoV-2 by FDA under an Emergency Use Authorization (EUA). This EUA will remain  in effect (meaning this test can be used) for the duration of the COVID-19  declaration under Section 564(b)(1) of the Act, 21 U.S.C. section 360bbb-3(b)(1), unless the authorization is terminated or revoked sooner.    Influenza A by PCR NEGATIVE NEGATIVE Final   Influenza B by PCR NEGATIVE NEGATIVE Final    Comment: (NOTE) The Xpert Xpress SARS-CoV-2/FLU/RSV assay is intended as an aid in  the diagnosis of influenza from Nasopharyngeal swab specimens and  should not be used as a sole basis for treatment. Nasal washings and  aspirates are unacceptable for Xpert Xpress SARS-CoV-2/FLU/RSV  testing. Fact Sheet for Patients: PinkCheek.be Fact Sheet for Healthcare Providers: GravelBags.it This test is not yet approved or cleared by the [place] FDA and  has been authorized for detection and/or diagnosis of SARS-CoV-2 by  FDA under an Emergency Use Authorization (EUA). This EUA will remain  in effect (meaning this test can be used) for the duration of the  Covid-19 declaration under Section 564(b)(1) of the Act, 21  U.S.C. section 360bbb-3(b)(1), unless the authorization is  terminated or revoked. Performed at [facility], [place] [address] [place], [place] [address]   Blood Culture (routine x 2)     Status: None   Collection Time: 05/30/19  6:25 PM   Specimen: BLOOD RIGHT FOREARM  Result Value Ref Range Status   Specimen Description BLOOD RIGHT FOREARM  Final   Special Requests   Final    BOTTLES DRAWN AEROBIC AND ANAEROBIC Blood Culture results may not be optimal due to an inadequate volume of blood received in culture bottles   Culture   Final    NO GROWTH 5 DAYS Performed at [facility], [place] [address], [place], [place] [address]    Report Status 06/04/2019 FINAL  Final  Body fluid culture     Status: None   Collection Time: 05/31/19 10:23 AM   Specimen: Body Fluid  Result Value Ref Range Status   Specimen Description FLUID  Final   Special Requests KNEE  Final   Gram Stain    Final    ABUNDANT WBC PRESENT, PREDOMINANTLY PMN NO ORGANISMS SEEN    Culture   Final    FEW STAPHYLOCOCCUS AUREUS RESULT CALLED TO, READ BACK BY AND VERIFIED WITH: RN N HARVEY [id number] MLM Performed at [facility], [address], [place], [place] [address]    Report Status 06/02/2019 FINAL  Final   Organism ID, Bacteria STAPHYLOCOCCUS AUREUS  Final      Susceptibility   Staphylococcus aureus - MIC*    CIPROFLOXACIN <=0.5 SENSITIVE Sensitive     ERYTHROMYCIN >=8 RESISTANT Resistant     GENTAMICIN <=0.5 SENSITIVE Sensitive     OXACILLIN 0.5 SENSITIVE Sensitive     TETRACYCLINE <=1 SENSITIVE Sensitive  VANCOMYCIN 1 SENSITIVE Sensitive     TRIMETH/SULFA <=10 SENSITIVE Sensitive     CLINDAMYCIN RESISTANT Resistant     RIFAMPIN <=0.5 SENSITIVE Sensitive     Inducible Clindamycin POSITIVE Resistant     * FEW STAPHYLOCOCCUS AUREUS  Surgical pcr screen     Status: Abnormal   Collection Time: 05/31/19 10:34 AM   Specimen: Nasal Mucosa; Nasal Swab  Result Value Ref Range Status   MRSA, PCR NEGATIVE NEGATIVE Final   Staphylococcus aureus POSITIVE (A) NEGATIVE Final    Comment: (NOTE) The Xpert SA Assay (FDA approved for NASAL specimens in patients 84 years of age and older), is one component of a comprehensive surveillance program. It is not intended to diagnose infection nor to guide or monitor treatment. Performed at [facility], [place] [address], [place], [place] [address]   Culture, blood (routine x 2)     Status: None (Preliminary result)   Collection Time: 06/01/19  5:30 AM   Specimen: BLOOD RIGHT HAND  Result Value Ref Range Status   Specimen Description BLOOD RIGHT HAND  Final   Special Requests   Final    BOTTLES DRAWN AEROBIC ONLY Blood Culture adequate volume   Culture   Final    NO GROWTH 4 DAYS Performed at [facility], [place] [address], [place], [place] [address]    Report Status PENDING  Incomplete  Culture, blood (routine x 2)     Status:  None (Preliminary result)   Collection Time: 06/01/19  5:50 AM   Specimen: BLOOD  Result Value Ref Range Status   Specimen Description BLOOD RIGHT ANTECUBITAL  Final   Special Requests   Final    BOTTLES DRAWN AEROBIC AND ANAEROBIC Blood Culture adequate volume   Culture   Final    NO GROWTH 4 DAYS Performed at [facility], [place] [address], [place], [place] [address]    Report Status PENDING  Incomplete  Aerobic/Anaerobic Culture (surgical/deep wound)     Status: None (Preliminary result)   Collection Time: 06/04/19 10:04 AM   Specimen: Abscess  Result Value Ref Range Status   Specimen Description ABSCESS  Final   Special Requests NONE  Final   Gram Stain   Final    MODERATE WBC PRESENT, PREDOMINANTLY PMN FEW GRAM POSITIVE COCCI IN PAIRS IN CLUSTERS Performed at [facility], [place] [address], [place], [place] [address]    Culture ABUNDANT STAPHYLOCOCCUS AUREUS  Final   Report Status PENDING  Incomplete    Impression/Plan:  1. Disseminated MSSA infection - knee s/p debridement, aspiration of the clavicular fluid, both with Staph aureus growth.    2. Medication monitoring - per home health protocol  Diagnosis: Disseminated MSSA infection - septic arthritis knee s/p debridement, SCM muscle abscess s/p aspiration  Culture Result: MSSA  No Known Allergies  OPAT Orders Discharge antibiotics to be given via PICC line Discharge antibiotics: Per pharmacy protocol X Duration: 6 weeks End Date: 07/13/19  [facility] Care Per Protocol:  Home health RN for IV administration and teaching; PICC line care and labs.    Labs weekly while on IV antibiotics: __x CBC with differential __ BMP _x_ CMP __x CRP _x_ ESR __ Vancomycin trough __ CK  _x_ Please pull PIC at completion of IV antibiotics __ Please leave PIC in place until doctor has seen patient or been notified  Fax weekly labs to [phone]  Clinic Follow Up Appt:

## 2019-06-06 NOTE — Progress Notes (Signed)
PT Cancellation Note  Patient Details Name: Herbert Walsh MRN: [id number] DOB: 1990-09-13   Cancelled Treatment:    Reason Eval/Treat Not Completed: Patient at procedure or test/unavailable, sterile procedure in progress in room. Will follow-up for PT Evaluation as schedule permits.  Ina Homes, PT, DPT Acute Rehabilitation Services  Pager [phone] Office [phone]  Malachy Chamber 06/06/2019, 9:56 AM

## 2019-06-06 NOTE — Progress Notes (Signed)
1 Day Post-Op Procedure(s) (LRB): TRANSESOPHAGEAL ECHOCARDIOGRAM (TEE) (N/A) Subjective: Less tender over R [place] joint Induration, edema improved Cant stand or walk due to R knee pain Objective: Vital signs in last 24 hours: Temp:  [97.8 F (36.6 C)-98.5 F (36.9 C)] 97.9 F (36.6 C) (04/23 0614) Pulse Rate:  [98-123] 119 (04/23 0614) Cardiac Rhythm: Sinus tachycardia (04/23 0856) Resp:  [16-17] 16 (04/23 0614) BP: (133-150)/(83-93) 133/86 (04/23 0614) SpO2:  [93 %-100 %] 96 % (04/23 0614)  Hemodynamic parameters for last 24 hours:  afebrile  Intake/Output from previous day: 04/22 0701 - 04/23 0700 In: 801.8 [P.O.:240; I.V.:461.8; IV Piggyback:100] Out: 3800 [Urine:3800] Intake/Output this shift: No intake/output data recorded.  nsr Lungs clear No cardiac murmur Upper sternum sgt tissue with mild edema, non tender No discrete fluctuant mass  Lab Results: Recent Labs    06/05/19 0118 06/06/19 0204  WBC 15.2* 16.8*  HGB 12.9* 11.6*  HCT 40.9 37.5*  PLT 354 557*   BMET:  Recent Labs    06/05/19 1116 06/06/19 0204  NA 132* 134*  K 4.1 4.6  CL 98 101  CO2 22 23  GLUCOSE 161* 141*  BUN 8 7  CREATININE 0.66 0.66  CALCIUM 8.6* 9.0    PT/INR: No results for input(s): LABPROT, INR in the last 72 hours. ABG No results found for: PHART, HCO3, TCO2, ACIDBASEDEF, O2SAT CBG (last 3)  Recent Labs    06/05/19 1727 06/05/19 2220 06/06/19 0812  GLUCAP 144* 153* 133*    Assessment/Plan: S/P Procedure(s) (LRB): TRANSESOPHAGEAL ECHOCARDIOGRAM (TEE) (N/A) Infection of R [place] joint is responding to iv antibiotics and does not need surgical drainage  Will follow as outpatient and arrange followup chest CT He also will need referral to dental clinic for poor hygiene   LOS: 7 days    Kathlee Nations Trigt III 06/06/2019

## 2019-06-06 NOTE — Progress Notes (Signed)
OT Cancellation Note  Patient Details Name: Herbert Walsh MRN: [id number] DOB: August 05, 1990   Cancelled Treatment:    Reason Eval/Treat Not Completed: Patient at procedure or test/ unavailable. Plan to reattempt.  Raynald Kemp, OT Acute Rehabilitation Services Pager: [phone] Office: [phone]  06/06/2019, 9:40 AM

## 2019-06-06 NOTE — Progress Notes (Signed)
Peripherally Inserted Central Catheter Placement  The IV Nurse has discussed with the patient and/or persons authorized to consent for the patient, the purpose of this procedure and the potential benefits and risks involved with this procedure.  The benefits include less needle sticks, lab draws from the catheter, and the patient may be discharged home with the catheter. Risks include, but not limited to, infection, bleeding, blood clot (thrombus formation), and puncture of an artery; nerve damage and irregular heartbeat and possibility to perform a PICC exchange if needed/ordered by physician.  Alternatives to this procedure were also discussed.  Bard Power PICC patient education guide, fact sheet on infection prevention and patient information card has been provided to patient /or left at bedside.    PICC Placement Documentation  PICC Single Lumen 06/06/19 PICC Right Basilic 40 cm 0 cm (Active)  Indication for Insertion or Continuance of Line Home intravenous therapies (PICC only) 06/06/19 1002  Exposed Catheter (cm) 0 cm 06/06/19 1002  Site Assessment Clean;Dry;Intact 06/06/19 1002  Line Status Flushed;Blood return noted 06/06/19 1002  Dressing Type Transparent 06/06/19 1002  Dressing Status Clean;Dry;Intact;Antimicrobial disc in place;Other (Comment) 06/06/19 1002  Dressing Intervention New dressing 06/06/19 1002  Dressing Change Due 06/13/19 06/06/19 1002       Reginia Forts Albarece 06/06/2019, 10:06 AM

## 2019-06-06 NOTE — Plan of Care (Signed)
  Problem: Activity: Goal: Risk for activity intolerance will decrease Outcome: Progressing   Problem: Coping: Goal: Level of anxiety will decrease Outcome: Progressing   Problem: Elimination: Goal: Will not experience complications related to bowel motility Outcome: Progressing   Problem: Pain Managment: Goal: General experience of comfort will improve Outcome: Progressing   Problem: Skin Integrity: Goal: Risk for impaired skin integrity will decrease Outcome: Progressing   

## 2019-06-06 NOTE — Plan of Care (Signed)

## 2019-06-09 LAB — AEROBIC/ANAEROBIC CULTURE W GRAM STAIN (SURGICAL/DEEP WOUND)

## 2019-06-18 ENCOUNTER — Encounter: Payer: Self-pay | Admitting: Internal Medicine

## 2019-06-18 ENCOUNTER — Other Ambulatory Visit: Payer: Self-pay

## 2019-06-18 ENCOUNTER — Other Ambulatory Visit (HOSPITAL_COMMUNITY)
Admission: RE | Admit: 2019-06-18 | Discharge: 2019-06-18 | Disposition: A | Discharge status: Home / Self Care | Payer: BC Managed Care – PPO | Source: Ambulatory Visit | Attending: Cardiothoracic Surgery | Admitting: Cardiothoracic Surgery

## 2019-06-18 ENCOUNTER — Encounter: Payer: Self-pay | Admitting: Cardiothoracic Surgery

## 2019-06-18 ENCOUNTER — Other Ambulatory Visit: Payer: Self-pay | Admitting: *Deleted

## 2019-06-18 ENCOUNTER — Ambulatory Visit: Payer: BC Managed Care – PPO | Admitting: Internal Medicine

## 2019-06-18 ENCOUNTER — Ambulatory Visit
Admit: 2019-06-18 | Discharge: 2019-06-18 | Disposition: A | Discharge status: Home / Self Care | Payer: BC Managed Care – PPO | Attending: Cardiothoracic Surgery | Admitting: Cardiothoracic Surgery

## 2019-06-18 ENCOUNTER — Ambulatory Visit: Payer: BC Managed Care – PPO | Admitting: Cardiothoracic Surgery

## 2019-06-18 VITALS — BP 124/80 | HR 132 | Wt 270.0 lb

## 2019-06-18 VITALS — BP 119/77 | HR 118 | Temp 97.7°F | Resp 20 | Ht 72.0 in | Wt 269.0 lb

## 2019-06-18 DIAGNOSIS — R7881 Bacteremia: Secondary | ICD-10-CM | POA: Diagnosis not present

## 2019-06-18 DIAGNOSIS — M25519 Pain in unspecified shoulder: Secondary | ICD-10-CM

## 2019-06-18 DIAGNOSIS — M009 Pyogenic arthritis, unspecified: Secondary | ICD-10-CM | POA: Diagnosis not present

## 2019-06-18 DIAGNOSIS — Z01812 Encounter for preprocedural laboratory examination: Secondary | ICD-10-CM | POA: Insufficient documentation

## 2019-06-18 DIAGNOSIS — B9561 Methicillin susceptible Staphylococcus aureus infection as the cause of diseases classified elsewhere: Secondary | ICD-10-CM

## 2019-06-18 DIAGNOSIS — M869 Osteomyelitis, unspecified: Secondary | ICD-10-CM | POA: Diagnosis not present

## 2019-06-18 DIAGNOSIS — M00011 Staphylococcal arthritis, right shoulder: Secondary | ICD-10-CM

## 2019-06-18 DIAGNOSIS — S4360XA Sprain of unspecified sternoclavicular joint, initial encounter: Secondary | ICD-10-CM | POA: Insufficient documentation

## 2019-06-18 DIAGNOSIS — Z20822 Contact with and (suspected) exposure to covid-19: Secondary | ICD-10-CM | POA: Insufficient documentation

## 2019-06-18 DIAGNOSIS — M00061 Staphylococcal arthritis, right knee: Secondary | ICD-10-CM

## 2019-06-18 DIAGNOSIS — J90 Pleural effusion, not elsewhere classified: Secondary | ICD-10-CM

## 2019-06-18 LAB — SARS CORONAVIRUS 2 (TAT 6-24 HRS): SARS Coronavirus 2: NEGATIVE

## 2019-06-18 IMAGING — CT CT CHEST W/ CM
1 series · 15 of 34 positions shown, 19 images · IV contrast (APPLIED)
Comparison: [DATE]

CLINICAL DATA: Follow-up right sternoclavicular joint fluid
collection.

EXAM:
CT CHEST WITH CONTRAST
TECHNIQUE: Multidetector CT imaging of the chest was performed during
intravenous contrast administration.
CONTRAST:  75mL [N[place]] IOPAMIDOL ([[place]]) INJECTION 61%

[Series 2: chest w/cm · axial · 0.91mm/px · z∈[+925,+1213]mm · 15 of 170 slices shown, 19 images]
[im 13/170  mediastinal]
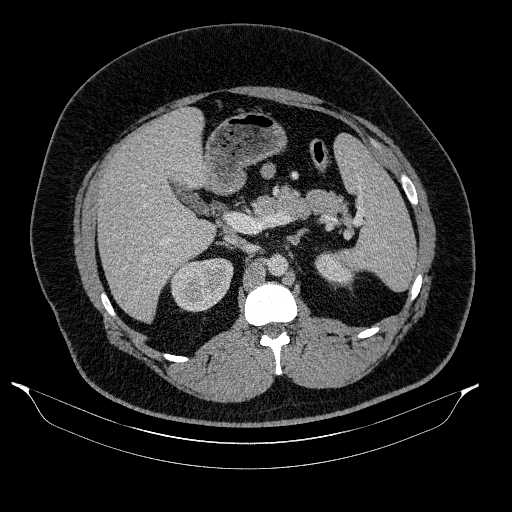
[im 13/170  lung]
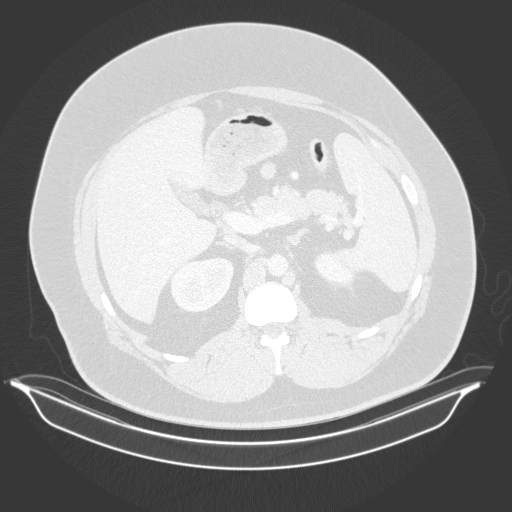
[im 26/170  lung]
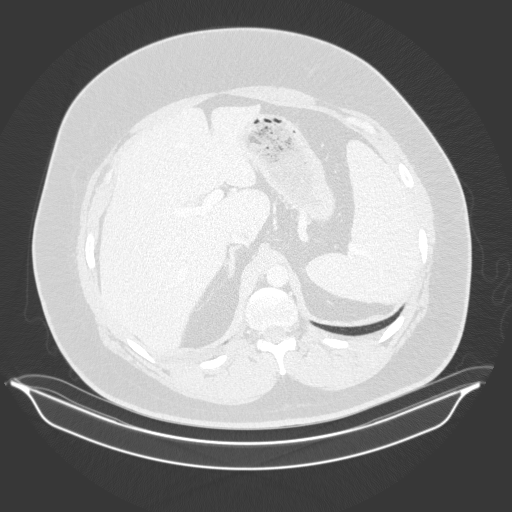
[im 34/170  lung]
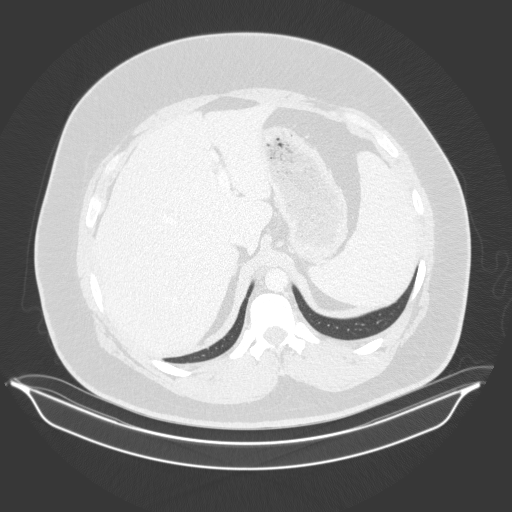
[im 44/170  lung]
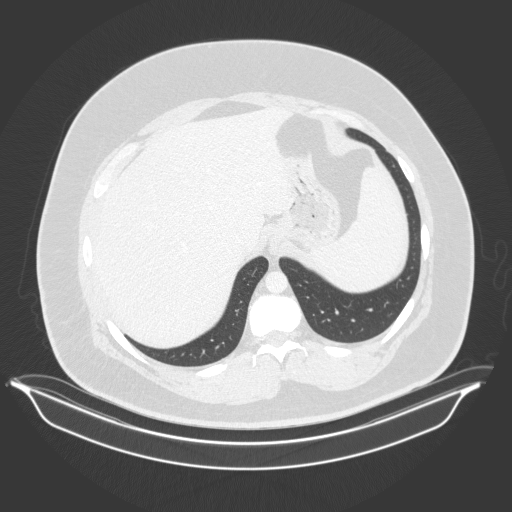
[im 57/170  mediastinal]
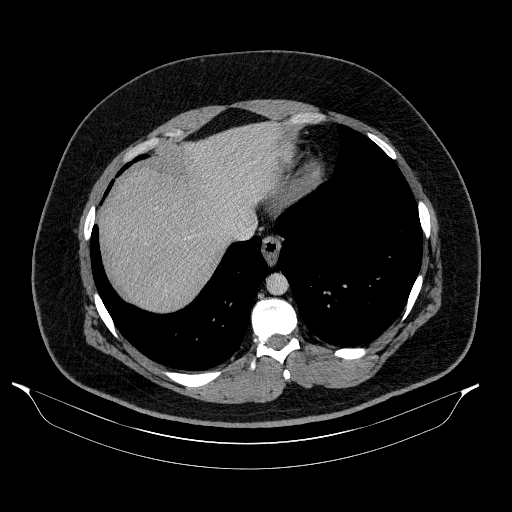
[im 57/170  lung]
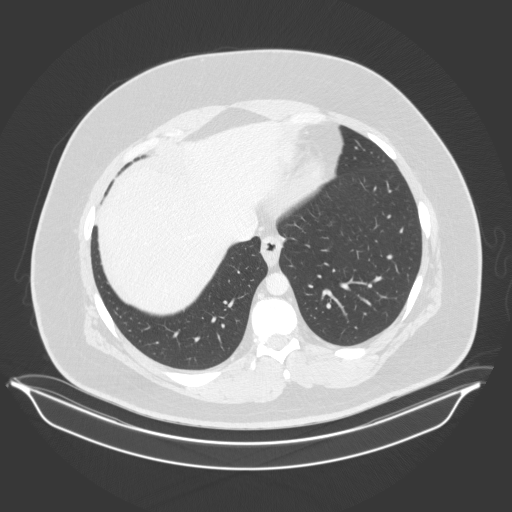
[im 68/170  lung]
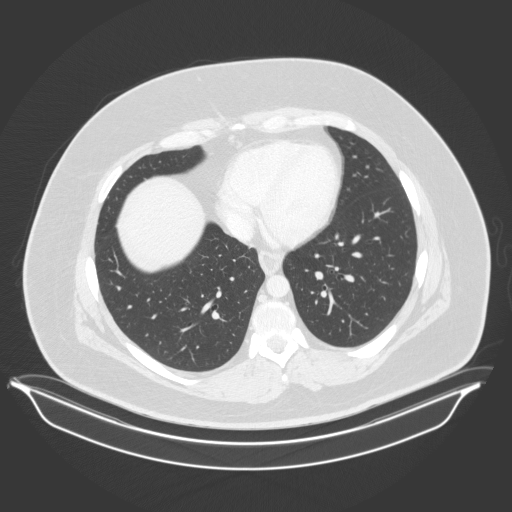
[im 76/170  lung]
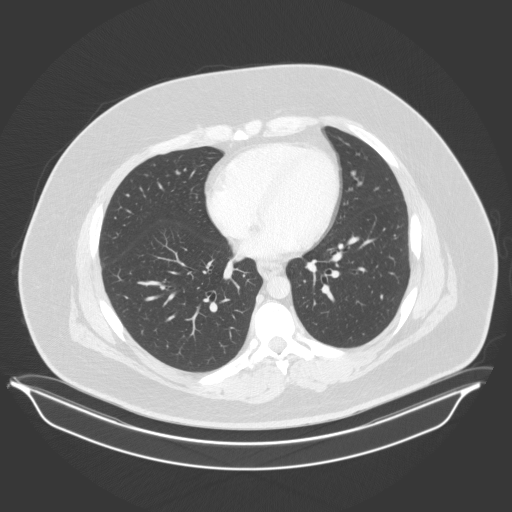
[im 88/170  lung]
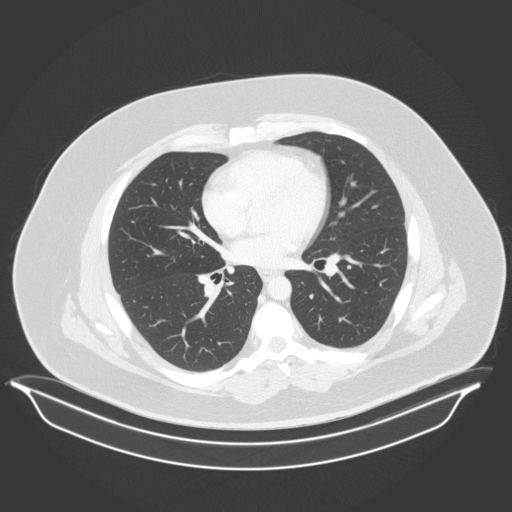
[im 94/170  mediastinal]
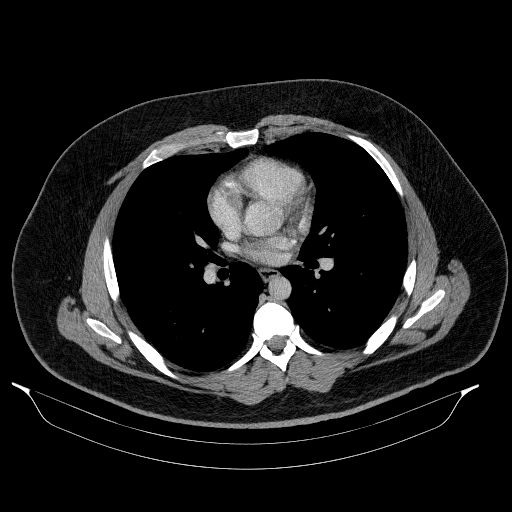
[im 94/170  lung]
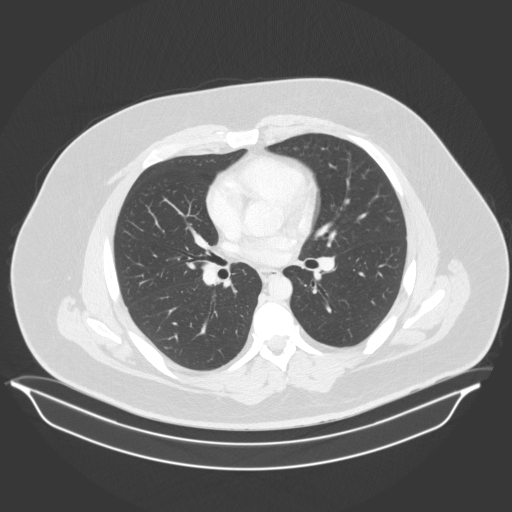
[im 102/170  lung]
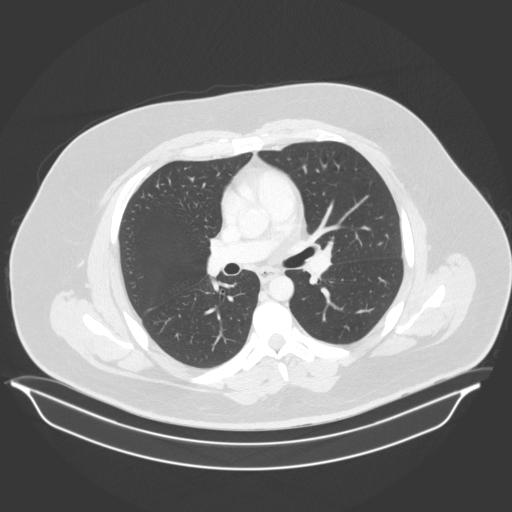
[im 113/170  lung]
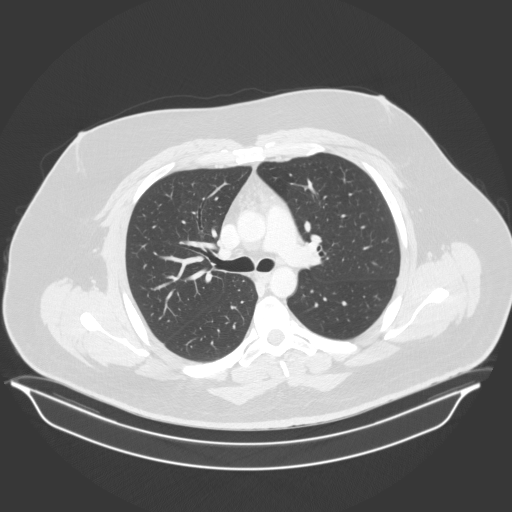
[im 126/170  lung]
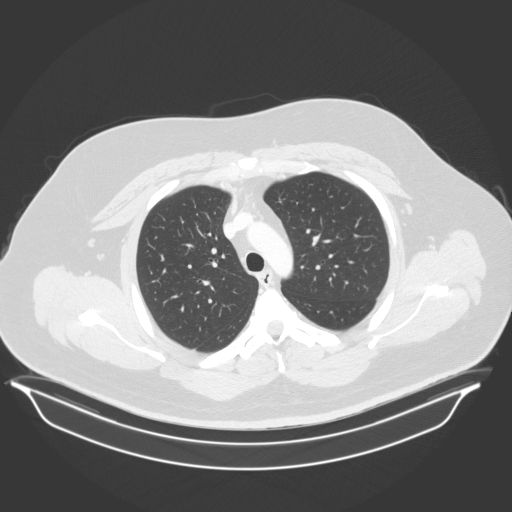
[im 136/170  mediastinal]
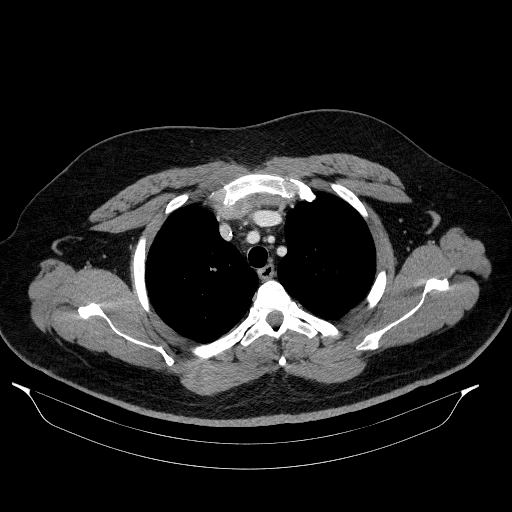
[im 136/170  lung]
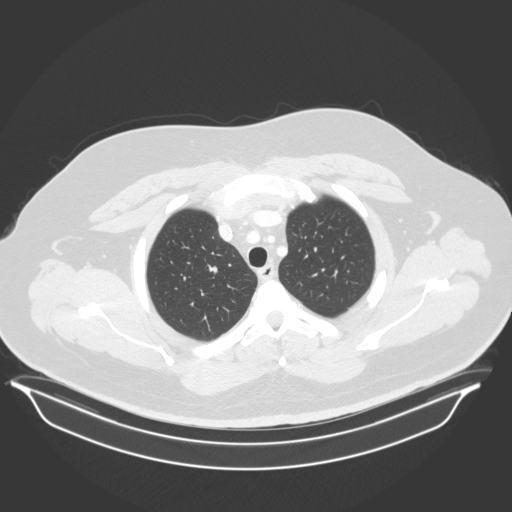
[im 144/170  lung]
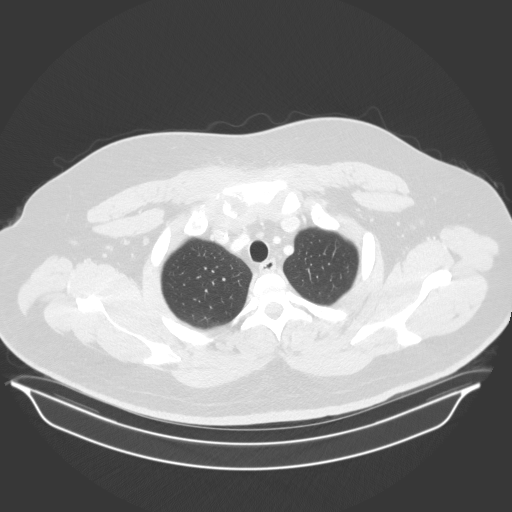
[im 157/170  lung]
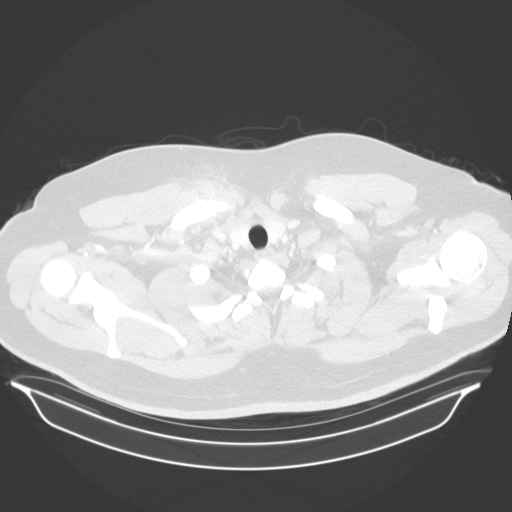

[15 of 34 positions shown; findings below may reference images not displayed]

FINDINGS: Cardiovascular: The heart size appears within normal limits. There
is no pericardial effusion identified.

Mediastinum/Nodes: Normal appearance of the thyroid gland. The
trachea appears patent and is midline. Normal appearance of the
esophagus. No enlarged mediastinal or hilar adenopathy. No
supraclavicular or axillary adenopathy.

Lungs/Pleura: No pleural effusion. No suspicious pulmonary nodule or
mass identified. No airspace consolidation.

Upper Abdomen: No acute abnormality.

Musculoskeletal: Since [DATE] there is now periarticular
erosions along both sides of the right sternoclavicular joint
compatible with septic arthritis and osteomyelitis, image [DATE] and
image [DATE]. New right sternoclavicular joint effusion measures 3.2 x
2.2 cm, image [DATE]. A sinus tract extends from the joint space along
the right sternoclavicular joint, image [DATE]. Surrounding soft tissue
swelling and subcutaneous fat stranding is identified.
IMPRESSION: 1. Significant progression of right sternoclavicular joint
inflammatory arthropathy. New large periarticular erosions along
both sides of the right sternoclavicular joint compatible with
septic arthritis and osteomyelitis.
2. Right sternoclavicular joint effusion is identified with a sinus
tract extending from the joint space along the right
sternoclavicular joint.

These results will be called to the ordering clinician or
representative by the Radiologist Assistant, and communication
documented in the PACS or [REDACTED].

## 2019-06-18 MED ORDER — IOPAMIDOL (ISOVUE-300) INJECTION 61%
75.0000 mL | Freq: Once | INTRAVENOUS | Status: AC | PRN
  Administered 2019-06-18: 75 mL via INTRAVENOUS

## 2019-06-18 NOTE — Progress Notes (Signed)
PCP is Glenda Chroman, MD Referring Provider is Glenda Chroman, MD  Chief Complaint  Patient presents with  . Consult    f/u hospital consult with Chest CT    HPI: Patient returns for outpatient follow-up after hospitalization for MSSA bacteremia with cellulitis of the right sternoclavicular joint.  He has history of bipolar disorder, substance abuse and smoking.  During the admission he had no discrete abscess and his white count and markers of infection improved on IV antibiotics.  He also needed right knee arthroscopy and irrigation by orthopedics for infection.  Echocardiogram showed no evidence of endocarditis.  Patient was discharged with a PICC line for home IV antibiotics and further follow-up with repeat scan was scheduled.  The patient has been on home IV Ancef.  He has had no fever but the pain in his right shoulder has increased.  CT scan of the chest shows the sternoclavicular joint now to have evidence of osteomyelitis with some bone destruction and fluid collection. No pleural effusion or deep mediastinal involvement  Patient will need surgical debridement of the right sternoclavicular joint under general anesthesia.  This will be scheduled within 48 hours at [facility]  I have discussed the procedure with the patient including the benefits alternatives and risks of bleeding and recurrent infection.  He agrees to be admitted for surgery of his right sternoclavicular joint.   Past Medical History:  Diagnosis Date  . Arthritis   . Hypertension 2019    Past Surgical History:  Procedure Laterality Date  . APPENDECTOMY    . I & D EXTREMITY Right 05/31/2019   Procedure: IRRIGATION AND DEBRIDEMENT LOWER EXTREMITY;  Surgeon: Leandrew Koyanagi, MD;  Location: [place];  Service: Orthopedics;  Laterality: Right;  . TEE WITHOUT CARDIOVERSION N/A 06/05/2019   Procedure: TRANSESOPHAGEAL ECHOCARDIOGRAM (TEE);  Surgeon: Lelon Perla, MD;  Location: [facility] ENDOSCOPY;  Service: Cardiovascular;   Laterality: N/A;    Family History  Problem Relation Age of Onset  . Alcohol abuse Mother   . Depression Mother   . Drug abuse Mother   . Physical abuse Mother   . Sexual abuse Mother   . ADD / ADHD Paternal Aunt   . Alcohol abuse Maternal Grandfather   . Bipolar disorder Maternal Grandmother   . Schizophrenia Maternal Grandmother   . Drug abuse Maternal Grandmother   . Alcohol abuse Paternal Grandfather   . Depression Paternal Grandfather   . Depression Paternal Grandmother   . Drug abuse Paternal Grandmother     Social History Social History   Tobacco Use  . Smoking status: Current Every Day Smoker    Packs/day: 0.50    Years: 11.00    Pack years: 5.50    Types: Cigarettes  . Smokeless tobacco: Never Used  Substance Use Topics  . Alcohol use: No    Comment: one beer every 2-3 months  . Drug use: No    Current Outpatient Medications  Medication Sig Dispense Refill  . atorvastatin (LIPITOR) 10 MG tablet Take 10 mg by mouth daily.  12  . ceFAZolin (ANCEF) IVPB Inject 2 g into the vein every 8 (eight) hours. Indication:  MSSA bacteremia and sternoclavicular joint osteomyelitis First Dose: Yes Last Day of Therapy:  07/13/2019 Labs - Once weekly:  CBC/D and BMP, Labs - Every other week:  ESR and CRP Method of administration: IV Push Method of administration may be changed at the discretion of home infusion pharmacist based upon assessment of the patient and/or  caregiver's ability to self-administer the medication ordered. 114 Units 0  . citalopram (CELEXA) 40 MG tablet Take 1 tablet (40 mg total) by mouth daily. 90 tablet 0  . cloNIDine (CATAPRES) 0.2 MG tablet Take 1 tablet (0.2 mg total) by mouth 3 (three) times daily. 270 tablet 0  . cyclobenzaprine (FLEXERIL) 10 MG tablet Take 1 tablet (10 mg total) by mouth 3 (three) times daily as needed for muscle spasms. 30 tablet 0  . lisinopril (PRINIVIL,ZESTRIL) 10 MG tablet Take 10 mg by mouth daily.    [name] lithium carbonate  (ESKALITH) 450 MG CR tablet Total of 750 mg daily (400 mg + 300 mg) (Patient taking differently: Take 450 mg by mouth See admin instructions. Total of 750 mg daily (450 mg + 300 mg)) 90 tablet 0  . omeprazole (PRILOSEC) 40 MG capsule Take 40 mg by mouth daily.    . risperidone (RISPERDAL) 4 MG tablet Take 1 tablet (4 mg total) by mouth at bedtime. 90 tablet 0  . sertraline (ZOLOFT) 50 MG tablet 25 mg daily for one week, then 50 mg daily (Patient taking differently: Take 50 mg by mouth daily. 25 mg daily for one week, then 50 mg daily) 30 tablet 1  . traZODone (DESYREL) 150 MG tablet Take 1 tablet (150 mg total) by mouth at bedtime. 90 tablet 0   No current facility-administered medications for this visit.    No Known Allergies  Review of Systems  No fever Able to walk but his right knee is still painful No syncope Smoking 1 to 5 cigarettes a day No headache or change in vision No difficulty breathing No palpitations No abdominal pain  BP 119/77   Pulse (!) 118   Temp 97.7 F (36.5 C) (Skin)   Resp 20   Ht 6' (1.829 m)   Wt 269 lb (122 kg)   SpO2 96% Comment: RA  BMI 36.48 kg/m  Physical Exam      Exam    General- alert and comfortable    Neck- no JVD, no cervical adenopathy palpable, no carotid bruit   Lungs- clear without rales, wheezes.  Right sternoclavicular joint without erythema but with mild swelling and a soft tissue defect noted over the clavicular head.  No drainage or fluctuance   Cor- regular rate and rhythm, no murmur , gallop   Abdomen- soft, non-tender   Extremities - warm, non-tender, minimal edema   Neuro- oriented, appropriate, no focal weakness   Diagnostic Tests: Images of chest CT scan personally reviewed and results as noted above.  Impression: MSSA infection of right sternoclavicular joint Despite prolonged IV antibiotics the infection has progressed to a discrete abscess with some osteomyelitis of the clavicular head which needs to be debrided  in the OR  Plan: Patient will be admitted to [facility] hospital on May 7 for surgery. Continue IV antibiotics until admission.   Len Childs, MD [facility] [phone]

## 2019-06-18 NOTE — Assessment & Plan Note (Signed)
Has worsened on the scan and for debridement on 5/7.  Will see in the hospital and likely continue 6 weeks with cefazolin from the surgery date.  Mom and patient aware.

## 2019-06-18 NOTE — Assessment & Plan Note (Signed)
Stable at this time.  Continuing on antibiotics.  TEE negative for vegetation and repeat cultures were negative.

## 2019-06-18 NOTE — Assessment & Plan Note (Signed)
He has pain but clinically looks stable.  Followed by Dr. Roda Shutters

## 2019-06-18 NOTE — Progress Notes (Signed)
   Subjective:    Patient ID: Herbert Walsh, male    DOB: 1990-09-12, 29 y.o.   MRN: [id number]  HPI Here for follow up of disseminated MSSA infection.   He was hospitalized and found right knee effusion and sternocleidomastoid infection and knee debrided by Dr. Roda Shutters.  He is follow by Dr. [name] for the [facility] and CT scan done today and is notable for worsening of the infection.  Scheduled for surgery on 5/7.  Tolerating cefazolin, no associated rash or diarrhea.     Review of Systems  Constitutional: Negative for chills, fatigue and fever.  Gastrointestinal: Negative for diarrhea and nausea.  Musculoskeletal:       Right knee and thigh pain  Skin: Negative for rash.       Objective:   Physical Exam Constitutional:      Appearance: Normal appearance.  Eyes:     General: No scleral icterus. Musculoskeletal:     Comments: Right knee with mild warmth, no erythema, sutures clean, no surrounding erythema  Skin:    Findings: No rash.  Neurological:     General: No focal deficit present.     Mental Status: He is alert.   SH: + tobacco        Assessment & Plan:

## 2019-06-19 ENCOUNTER — Other Ambulatory Visit: Payer: Self-pay

## 2019-06-19 ENCOUNTER — Other Ambulatory Visit: Payer: Self-pay | Admitting: *Deleted

## 2019-06-19 ENCOUNTER — Encounter (HOSPITAL_COMMUNITY): Payer: Self-pay | Admitting: Cardiothoracic Surgery

## 2019-06-19 DIAGNOSIS — M25511 Pain in right shoulder: Secondary | ICD-10-CM

## 2019-06-19 MED ORDER — DEXTROSE 5 % IV SOLN
3.0000 g | INTRAVENOUS | Status: AC
  Administered 2019-06-20: 3 g via INTRAVENOUS
  Filled 2019-06-19: qty 3
  Filled 2019-06-19: qty 3000

## 2019-06-19 NOTE — Anesthesia Preprocedure Evaluation (Deleted)
Anesthesia Evaluation    Airway        Dental   Pulmonary Current Smoker,           Cardiovascular hypertension,      Neuro/Psych    GI/Hepatic   Endo/Other    Renal/GU      Musculoskeletal   Abdominal   Peds  Hematology   Anesthesia Other Findings   Reproductive/Obstetrics                                                              Anesthesia Evaluation  Patient identified by MRN, date of birth, ID band Patient awake    Reviewed: Allergy & Precautions, NPO status , Patient's Chart, lab work & pertinent test results  Airway Mallampati: I  TM Distance: <3 FB Neck ROM: Full  Mouth opening: Limited Mouth Opening  Dental  (+) Teeth Intact, Poor Dentition   Pulmonary Current Smoker,    Pulmonary exam normal        Cardiovascular hypertension,  Rhythm:Regular Rate:Tachycardia     Neuro/Psych PSYCHIATRIC DISORDERS Anxiety    GI/Hepatic negative GI ROS, Neg liver ROS,   Endo/Other  negative endocrine ROS  Renal/GU negative Renal ROS     Musculoskeletal  (+) Arthritis ,   Abdominal (+) + obese,   Peds  Hematology negative hematology ROS (+)   Anesthesia Other Findings   Reproductive/Obstetrics                           Echo:  1. Left ventricular ejection fraction, by estimation, is 60 to 65%. The  left ventricle has normal function. The left ventricle has no regional  wall motion abnormalities. There is mild left ventricular hypertrophy.  Left ventricular diastolic parameters  were normal.  2. Right ventricular systolic function is normal. The right ventricular  size is normal. Tricuspid regurgitation signal is inadequate for assessing  PA pressure.  3. The mitral valve is normal in structure. No evidence of mitral valve  regurgitation.  4. The aortic valve was not well visualized. Aortic valve regurgitation  is not  visualized. No aortic stenosis is present.  5. The inferior vena cava is normal in size with greater than 50%  respiratory variability, suggesting right atrial pressure of 3 mmHg.  6. No vegetation seen, though technically difficult study. If clinical  suspicion for endocarditis, consider TEE   Anesthesia Physical Anesthesia Plan  ASA: II  Anesthesia Plan: MAC   Post-op Pain Management:    Induction: Intravenous  PONV Risk Score and Plan: 0 and Propofol infusion  Airway Management Planned: Natural Airway and Nasal Cannula  Additional Equipment: None  Intra-op Plan:   Post-operative Plan:   Informed Consent: I have reviewed the patients History and Physical, chart, labs and discussed the procedure including the risks, benefits and alternatives for the proposed anesthesia with the patient or authorized representative who has indicated his/her understanding and acceptance.     Dental advisory given  Plan Discussed with: CRNA  Anesthesia Plan Comments:        Anesthesia Quick Evaluation  Anesthesia Physical Anesthesia Plan  ASA:   Anesthesia Plan:    Post-op Pain Management:    Induction:   PONV Risk Score and Plan:   Airway Management  Planned:   Additional Equipment:   Intra-op Plan:   Post-operative Plan:   Informed Consent:   Plan Discussed with:   Anesthesia Plan Comments: (PAT note written 06/19/2019 by Myra Gianotti, PA-C. )        Anesthesia Quick Evaluation

## 2019-06-19 NOTE — Progress Notes (Signed)
Spoke with pt's mother, Alvis Lemmings after getting permission to do so by patient. She states pt has recently been diagnosed with Tachycardia, has not seen a cardiologist. Pt is treated for HTN.   Covid test done 06/18/19 and it's negative. Mom states pt has been in quarantine since the test was done and understands that he stays in quarantine until he comes to the hospital tomorrow.

## 2019-06-19 NOTE — Progress Notes (Signed)
Anesthesia Chart Review: Herbert Walsh    Case: [id number] Date/Time: 06/20/19 0715   Procedures:      DEBRIDEMENT OF STERNOCLAVICULAR JOINT ABSCESS (Right )     APPLICATION OF WOUND VAC (Right )   Anesthesia type: General   Pre-op diagnosis: RIGHT STERNOCLAVICULAR JOINT INFECTION   [facility] ROOM 17 / [facility]   Surgeons: [name], MD      DISCUSSION: Patient is a 29 year old male scheduled for the above procedure.  History includes smoking, HTN, RA, appendicitis with perforation (s/p intraperitoneal surgery in CT), ischemic colitis (09/2008; likely due to prior abdominal surgery and peritonitis", had hypercoagulable panel by Dr. [name]), PTSD.   Admitted 05/30/19-06/06/19: He re-presented to the ED on 05/30/19 for RLE and right shoulder/clavicular swelling and tachycardia. CTA negative for PE. RLE venous US did not show DVT or Baker's cyst. MRI showed septic arthritis and osteomyelitis of the sternoclavicular joints with multiples surrounding abscesses and findings suggestive of myositis and fascitis of the RLE. Blood culture positive for MSSA. WBC nearly 20K. CK 1475. Lactic acid 4.2. Glucose elevated at 268 (in setting of prednisone; A1c 6.2%).  He was admitted for IV antibiotics. ID, CT surgery and orthopedics consulted. S/p arthrotomy of right knee joint for washout for septic arthritis 05/1719. Infection responsive to antibiotics. TEE did not show vegetation. PICC line place, and he was discharged on 6 weeks Cefazolin with close follow-up. Out-patient dental referral recommended.  Unfortunately follow-up chest CT on 06/18/19 showed worsening of sternoclavicular infection and surgery recommended.   Preoperative COVID-19 test negative on 06/18/2019. He is a same day work-up since he was a late add-on following findings on 06/18/19 CT scan. Anesthesia team to evaluate on the day of surgery. He is for labs on arrival--Dr. [name] has ordered a T&C for 2 units PRBC (Prepare PRBC).     VS:  BP Readings from Last 3 Encounters:  06/18/19 124/80  06/18/19 119/77  06/06/19 131/84   Pulse Readings from Last 3 Encounters:  06/18/19 (!) 132  06/18/19 (!) 118  06/06/19 98    PROVIDERS: Ignatius Specking, MD is PCP  Staci Righter, MD is ID Leatha Gilding, MD is hematologist [facility] Everywhere). Last evaluated for anemia and leukocytosis 05/01/19.    LABS: For day of surgery. Last lab results include: Lab Results  Component Value Date   WBC 16.8 (H) 06/06/2019   HGB 11.6 (L) 06/06/2019   HCT 37.5 (L) 06/06/2019   PLT 557 (H) 06/06/2019   GLUCOSE 141 (H) 06/06/2019   ALT 22 05/31/2019   AST 29 05/31/2019   NA 134 (L) 06/06/2019   K 4.6 06/06/2019   CL 101 06/06/2019   CREATININE 0.66 06/06/2019   BUN 7 06/06/2019   CO2 23 06/06/2019   INR 1.2 06/03/2019   HGBA1C 6.2 (H) 05/31/2019     IMAGES: CT Chest 06/18/19: IMPRESSION: 1. Significant progression of right sternoclavicular joint inflammatory arthropathy. New large periarticular erosions along both sides of the right sternoclavicular joint compatible with septic arthritis and osteomyelitis. 2. Right sternoclavicular joint effusion is identified with a sinus tract extending from the joint space along the right sternoclavicular joint.  MRI right Tibia/Fibula 05/31/19: IMPRESSION: 1. Abnormal edema within muscle groups and along fascia planes with associated enhancement especially proximally in the anterior and posterior compartments of the lower leg. Myositis with fasciitis is suspected. The lower portions of the compartments are not as involved. We do not demonstrate diffuse muscular or diffuse compartmental  accentuated edema to further indicate risk for compartment syndrome although compartmental pressures may still warranted if clinical indicators of compartment syndrome are present. Overlying cellulitis noted. Small fluid collection tracking along the superficial fascia margin of the  posterior compartment posteromedially, although without the thick enhancing margin characteristic of an abscess. 2. Knee effusion with only minimal synovial enhancement and no findings of abnormal osseous activity to suggest osteomyelitis. In general with this minimal degree of synovial enhancement I am skeptical of septic joint. 3. No osteomyelitis.   EKG: 06/04/19: ST at 117 bpm   CV: TEE 06/05/19: IMPRESSIONS  1. Normal LV function; no vegetations.  2. Left ventricular ejection fraction, by estimation, is 55 to 60%. The  left ventricle has normal function. The left ventricle has no regional  wall motion abnormalities.  3. Right ventricular systolic function is normal. The right ventricular  size is normal.  4. No left atrial/left atrial appendage thrombus was detected.  5. The mitral valve is normal in structure. Trivial mitral valve  regurgitation.  6. The aortic valve is tricuspid. Aortic valve regurgitation is not  visualized.  7. Aortic dilatation noted.   TTE 06/01/19: IMPRESSIONS  1. Left ventricular ejection fraction, by estimation, is 60 to 65%. The  left ventricle has normal function. The left ventricle has no regional  wall motion abnormalities. There is mild left ventricular hypertrophy.  Left ventricular diastolic parameters  were normal.  2. Right ventricular systolic function is normal. The right ventricular  size is normal. Tricuspid regurgitation signal is inadequate for assessing  PA pressure.  3. The mitral valve is normal in structure. No evidence of mitral valve  regurgitation.  4. The aortic valve was not well visualized. Aortic valve regurgitation  is not visualized. No aortic stenosis is present.  5. The inferior vena cava is normal in size with greater than 50%  respiratory variability, suggesting right atrial pressure of 3 mmHg.  6. No vegetation seen, though technically difficult study. If clinical  suspicion for endocarditis,  consider TEE   RLE venous US 05/30/19: Summary:  RIGHT:  - There is no evidence of deep vein thrombosis in the lower extremity.  However, portions of this examination were limited- see technologist  comments above.  - No cystic structure found in the popliteal fossa.  - Interstitial fluid noted in mid calf.    Past Medical History:  Diagnosis Date  . Arthritis   . Hypertension 2019  . Ischemic colitis (HCC) 09/2008    Past Surgical History:  Procedure Laterality Date  . APPENDECTOMY    . I & D EXTREMITY Right 05/31/2019   Procedure: IRRIGATION AND DEBRIDEMENT LOWER EXTREMITY;  Surgeon: Tarry Kos, MD;  Location: [facility] OR;  Service: Orthopedics;  Laterality: Right;  . TEE WITHOUT CARDIOVERSION N/A 06/05/2019   Procedure: TRANSESOPHAGEAL ECHOCARDIOGRAM (TEE);  Surgeon: Lewayne Bunting, MD;  Location: [facility] ENDOSCOPY;  Service: Cardiovascular;  Laterality: N/A;    MEDICATIONS: . [START ON 06/20/2019] ceFAZolin (ANCEF) 3 g in dextrose 5 % 50 mL IVPB   . atorvastatin (LIPITOR) 10 MG tablet  . ceFAZolin (ANCEF) IVPB  . cloNIDine (CATAPRES) 0.2 MG tablet  . cyclobenzaprine (FLEXERIL) 10 MG tablet  . esomeprazole (NEXIUM) 40 MG capsule  . lisinopril (PRINIVIL,ZESTRIL) 10 MG tablet  . lithium carbonate (ESKALITH) 450 MG CR tablet  . lithium carbonate (LITHOBID) 300 MG CR tablet  . Multiple Vitamin (MULTIVITAMIN WITH MINERALS) TABS tablet  . risperidone (RISPERDAL) 4 MG tablet  . sertraline (ZOLOFT) 50 MG  tablet  . traZODone (DESYREL) 150 MG tablet  . citalopram (CELEXA) 40 MG tablet     Myra Gianotti, PA-C Surgical Short Stay/Anesthesiology [facility] Phone [phone] [facility] Phone [phone] 06/19/2019 10:30 AM

## 2019-06-19 NOTE — Anesthesia Preprocedure Evaluation (Addendum)
Anesthesia Evaluation  Patient identified by MRN, date of birth, ID band Patient awake    Reviewed: Allergy & Precautions, NPO status , Patient's Chart, lab work & pertinent test results  Airway Mallampati: III  TM Distance: >3 FB Neck ROM: Full    Dental  (+) Poor Dentition, Chipped, Loose, Dental Advisory Given   Pulmonary Current Smoker,    Pulmonary exam normal        Cardiovascular hypertension, Pt. on medications Normal cardiovascular exam  IMPRESSIONS    1. Normal LV function; no vegetations.  2. Left ventricular ejection fraction, by estimation, is 55 to 60%. The  left ventricle has normal function. The left ventricle has no regional  wall motion abnormalities.  3. Right ventricular systolic function is normal. The right ventricular  size is normal.  4. No left atrial/left atrial appendage thrombus was detected.  5. The mitral valve is normal in structure. Trivial mitral valve  regurgitation.  6. The aortic valve is tricuspid. Aortic valve regurgitation is not  visualized.  7. Aortic dilatation noted.    Neuro/Psych PSYCHIATRIC DISORDERS Anxiety Depression Bipolar Disorder Schizophrenia negative neurological ROS     GI/Hepatic Neg liver ROS, GERD  ,  Endo/Other  negative endocrine ROS  Renal/GU negative Renal ROS     Musculoskeletal  (+) Arthritis ,   Abdominal (+) - obese,   Peds  Hematology negative hematology ROS (+)   Anesthesia Other Findings   Reproductive/Obstetrics                           Echo:  1. Left ventricular ejection fraction, by estimation, is 60 to 65%. The  left ventricle has normal function. The left ventricle has no regional  wall motion abnormalities. There is mild left ventricular hypertrophy.  Left ventricular diastolic parameters  were normal.  2. Right ventricular systolic function is normal. The right ventricular  size is normal.  Tricuspid regurgitation signal is inadequate for assessing  PA pressure.  3. The mitral valve is normal in structure. No evidence of mitral valve  regurgitation.  4. The aortic valve was not well visualized. Aortic valve regurgitation  is not visualized. No aortic stenosis is present.  5. The inferior vena cava is normal in size with greater than 50%  respiratory variability, suggesting right atrial pressure of 3 mmHg.  6. No vegetation seen, though technically difficult study. If clinical  suspicion for endocarditis, consider TEE   Anesthesia Physical  Anesthesia Plan  ASA: II  Anesthesia Plan: General   Post-op Pain Management:    Induction: Intravenous  PONV Risk Score and Plan: 2 and Ondansetron and Dexamethasone  Airway Management Planned: Oral ETT  Additional Equipment: None  Intra-op Plan:   Post-operative Plan: Extubation in OR  Informed Consent: I have reviewed the patients History and Physical, chart, labs and discussed the procedure including the risks, benefits and alternatives for the proposed anesthesia with the patient or authorized representative who has indicated his/her understanding and acceptance.     Dental advisory given  Plan Discussed with: CRNA, Anesthesiologist and Surgeon  Anesthesia Plan Comments:        Anesthesia Quick Evaluation

## 2019-06-20 ENCOUNTER — Telehealth (HOSPITAL_COMMUNITY): Payer: Self-pay

## 2019-06-20 ENCOUNTER — Other Ambulatory Visit: Payer: Self-pay

## 2019-06-20 ENCOUNTER — Inpatient Hospital Stay (HOSPITAL_COMMUNITY)
Admission: RE | Admit: 2019-06-20 | Discharge: 2019-06-24 | DRG: 511 | Disposition: A | Discharge status: Home Health | Payer: BC Managed Care – PPO | Source: Ambulatory Visit | Attending: Cardiothoracic Surgery | Admitting: Cardiothoracic Surgery

## 2019-06-20 ENCOUNTER — Inpatient Hospital Stay (HOSPITAL_COMMUNITY): Payer: BC Managed Care – PPO

## 2019-06-20 ENCOUNTER — Inpatient Hospital Stay: Payer: BC Managed Care – PPO | Admitting: Orthopaedic Surgery

## 2019-06-20 ENCOUNTER — Encounter (HOSPITAL_COMMUNITY): Payer: Self-pay | Admitting: Cardiothoracic Surgery

## 2019-06-20 ENCOUNTER — Telehealth: Payer: Self-pay

## 2019-06-20 ENCOUNTER — Encounter (HOSPITAL_COMMUNITY)
Admission: RE | Disposition: A | Discharge status: Home Health | Payer: Self-pay | Source: Ambulatory Visit | Attending: Cardiothoracic Surgery

## 2019-06-20 ENCOUNTER — Inpatient Hospital Stay (HOSPITAL_COMMUNITY): Payer: BC Managed Care – PPO | Admitting: Vascular Surgery

## 2019-06-20 DIAGNOSIS — D649 Anemia, unspecified: Secondary | ICD-10-CM | POA: Diagnosis present

## 2019-06-20 DIAGNOSIS — A4901 Methicillin susceptible Staphylococcus aureus infection, unspecified site: Secondary | ICD-10-CM | POA: Diagnosis not present

## 2019-06-20 DIAGNOSIS — F209 Schizophrenia, unspecified: Secondary | ICD-10-CM | POA: Diagnosis present

## 2019-06-20 DIAGNOSIS — M0000 Staphylococcal arthritis, unspecified joint: Secondary | ICD-10-CM | POA: Diagnosis present

## 2019-06-20 DIAGNOSIS — F17210 Nicotine dependence, cigarettes, uncomplicated: Secondary | ICD-10-CM | POA: Diagnosis present

## 2019-06-20 DIAGNOSIS — J939 Pneumothorax, unspecified: Secondary | ICD-10-CM

## 2019-06-20 DIAGNOSIS — B9561 Methicillin susceptible Staphylococcus aureus infection as the cause of diseases classified elsewhere: Secondary | ICD-10-CM | POA: Diagnosis present

## 2019-06-20 DIAGNOSIS — M25511 Pain in right shoulder: Secondary | ICD-10-CM

## 2019-06-20 DIAGNOSIS — M069 Rheumatoid arthritis, unspecified: Secondary | ICD-10-CM | POA: Diagnosis present

## 2019-06-20 DIAGNOSIS — M25519 Pain in unspecified shoulder: Secondary | ICD-10-CM

## 2019-06-20 DIAGNOSIS — M86011 Acute hematogenous osteomyelitis, right shoulder: Secondary | ICD-10-CM

## 2019-06-20 DIAGNOSIS — F319 Bipolar disorder, unspecified: Secondary | ICD-10-CM | POA: Diagnosis present

## 2019-06-20 DIAGNOSIS — I10 Essential (primary) hypertension: Secondary | ICD-10-CM | POA: Diagnosis present

## 2019-06-20 DIAGNOSIS — M869 Osteomyelitis, unspecified: Principal | ICD-10-CM | POA: Diagnosis present

## 2019-06-20 DIAGNOSIS — K219 Gastro-esophageal reflux disease without esophagitis: Secondary | ICD-10-CM | POA: Diagnosis present

## 2019-06-20 DIAGNOSIS — M009 Pyogenic arthritis, unspecified: Secondary | ICD-10-CM | POA: Diagnosis not present

## 2019-06-20 DIAGNOSIS — Z818 Family history of other mental and behavioral disorders: Secondary | ICD-10-CM

## 2019-06-20 DIAGNOSIS — L02213 Cutaneous abscess of chest wall: Secondary | ICD-10-CM

## 2019-06-20 DIAGNOSIS — Z20822 Contact with and (suspected) exposure to covid-19: Secondary | ICD-10-CM | POA: Diagnosis present

## 2019-06-20 HISTORY — DX: Depression, unspecified: F32.A

## 2019-06-20 HISTORY — PX: APPLICATION OF WOUND VAC: SHX5189

## 2019-06-20 HISTORY — DX: Anemia, unspecified: D64.9

## 2019-06-20 HISTORY — DX: Tachycardia, unspecified: R00.0

## 2019-06-20 HISTORY — DX: Gastro-esophageal reflux disease without esophagitis: K21.9

## 2019-06-20 HISTORY — DX: Bipolar disorder, unspecified: F31.9

## 2019-06-20 HISTORY — PX: I & D EXTREMITY: SHX5045

## 2019-06-20 LAB — COMPREHENSIVE METABOLIC PANEL
ALT: 26 U/L (ref 0–44)
AST: 23 U/L (ref 15–41)
Albumin: 2 g/dL — ABNORMAL LOW (ref 3.5–5.0)
Alkaline Phosphatase: 96 U/L (ref 38–126)
Anion gap: 12 (ref 5–15)
BUN: 7 mg/dL (ref 6–20)
CO2: 23 mmol/L (ref 22–32)
Calcium: 8.7 mg/dL — ABNORMAL LOW (ref 8.9–10.3)
Chloride: 97 mmol/L — ABNORMAL LOW (ref 98–111)
Creatinine, Ser: 0.77 mg/dL (ref 0.61–1.24)
GFR calc Af Amer: >60 mL/min (ref >60)
GFR calc non Af Amer: >60 mL/min (ref >60)
Glucose, Bld: 162 mg/dL — ABNORMAL HIGH (ref 70–99)
Potassium: 4 mmol/L (ref 3.5–5.1)
Sodium: 132 mmol/L — ABNORMAL LOW (ref 135–145)
Total Bilirubin: 0.5 mg/dL (ref 0.3–1.2)
Total Protein: 6.5 g/dL (ref 6.5–8.1)

## 2019-06-20 LAB — CBC
HCT: 28.7 % — ABNORMAL LOW (ref 39.0–52.0)
Hemoglobin: 8.7 g/dL — ABNORMAL LOW (ref 13.0–17.0)
MCH: 24.2 pg — ABNORMAL LOW (ref 26.0–34.0)
MCHC: 30.3 g/dL (ref 30.0–36.0)
MCV: 79.9 fL — ABNORMAL LOW (ref 80.0–100.0)
Platelets: 518 10*3/uL — ABNORMAL HIGH (ref 150–400)
RBC: 3.59 MIL/uL — ABNORMAL LOW (ref 4.22–5.81)
RDW: 16 % — ABNORMAL HIGH (ref 11.5–15.5)
WBC: 9.7 10*3/uL (ref 4.0–10.5)
nRBC: 0 % (ref 0.0–0.2)

## 2019-06-20 LAB — PROTIME-INR
INR: 1.1 (ref 0.8–1.2)
Prothrombin Time: 14.1 seconds (ref 11.4–15.2)

## 2019-06-20 LAB — PREPARE RBC (CROSSMATCH)

## 2019-06-20 LAB — ABO/RH: ABO/RH(D): O POS

## 2019-06-20 LAB — APTT: aPTT: 41 seconds — ABNORMAL HIGH (ref 24–36)

## 2019-06-20 IMAGING — CR DG CHEST 1V PORT
1 series · 1 of 1 positions shown · non-contrast
Comparison: [DATE] study obtained earlier in the day

CLINICAL DATA: Hypoxia.  Status post thoracic surgery

EXAM:
PORTABLE CHEST 1 VIEW

[AP]
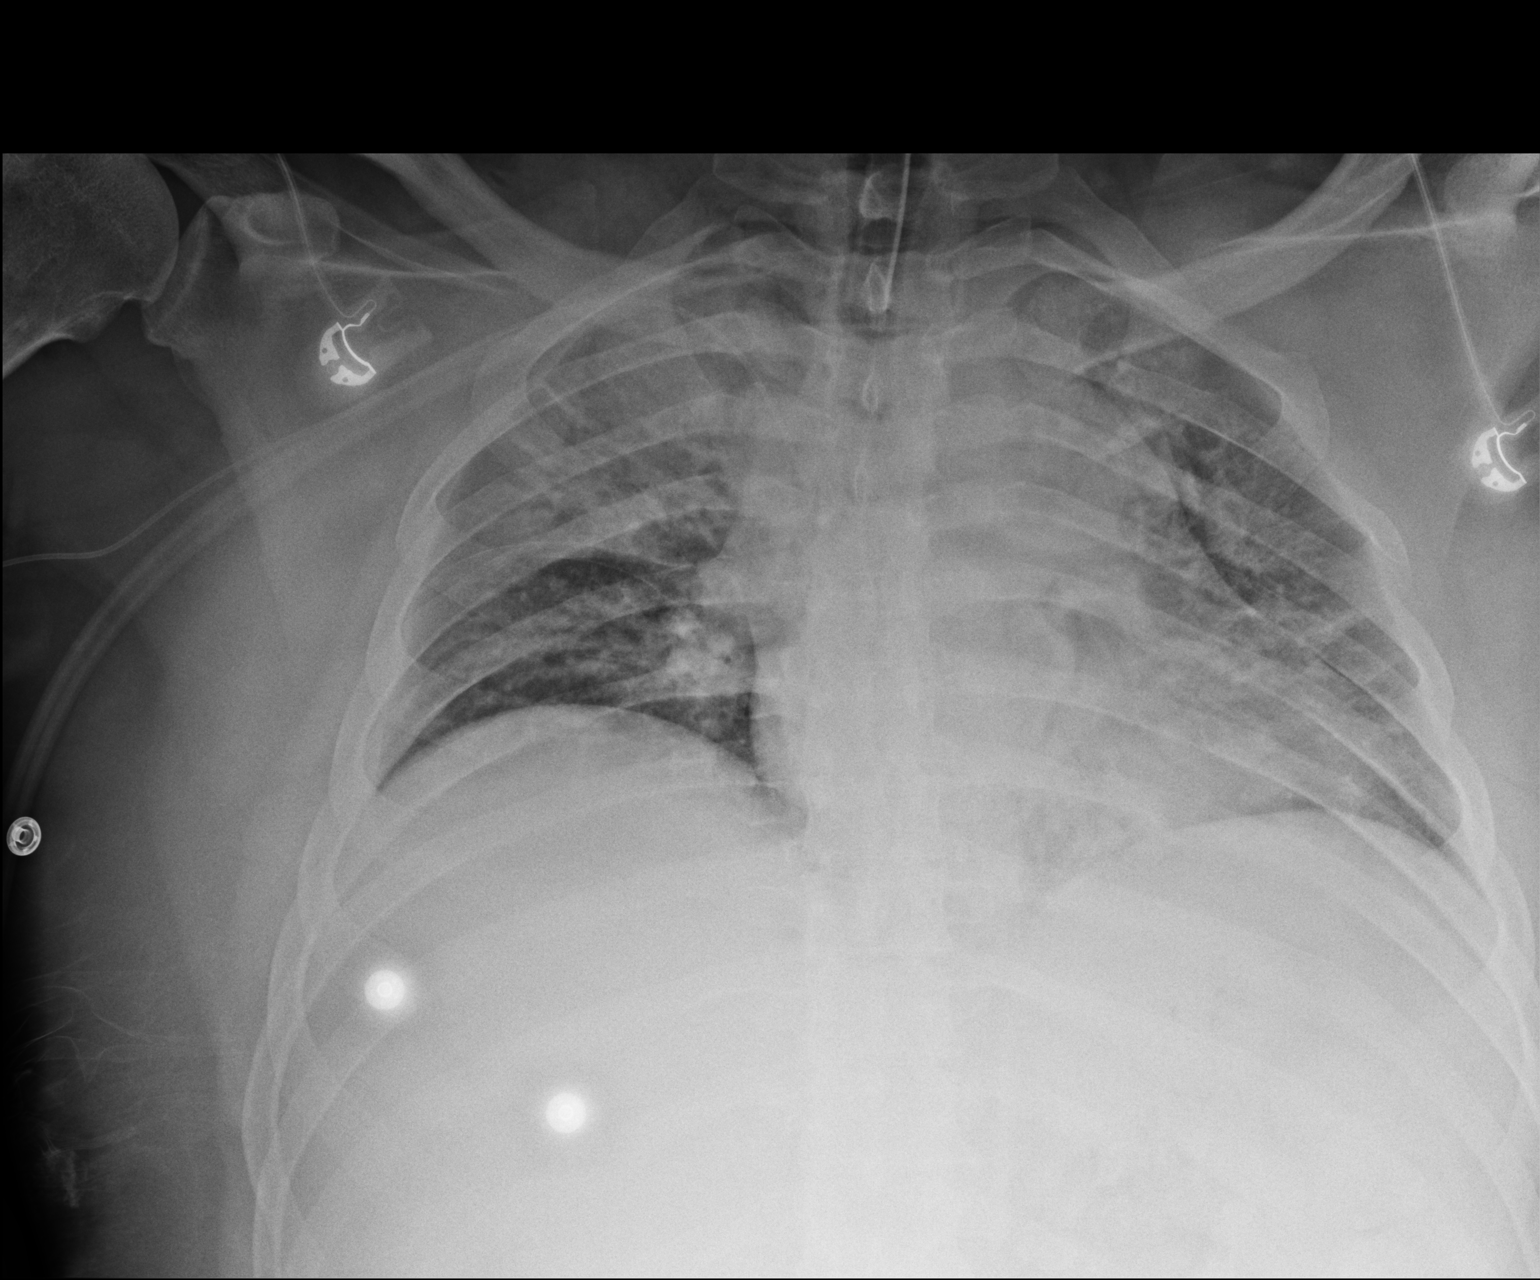

[1 of 1 positions shown; findings below may reference images not displayed]

FINDINGS: Endotracheal tube tip is 4.9 cm above the carina. Central catheter
tip is in the superior vena cava. No pneumothorax. There is
interstitial edema. There is consolidation in portions of the right
upper lobe and left base. Heart is upper normal in size with
pulmonary vascularity normal. No adenopathy. No bone lesions.
IMPRESSION: Tube and catheter positions as described without pneumothorax.
Interstitial edema. Airspace opacity in the right upper lobe and
left base regions noted. These areas of opacity may represent
alveolar edema. A degree early pneumonia or possible aspiration are
differential considerations for these areas of airspace opacity.
Heart upper normal in size.

## 2019-06-20 IMAGING — CR DG CHEST 2V
2 series · 2 of 2 positions shown · non-contrast
Comparison: CT of the chest [DATE]

CLINICAL DATA: Preop for debridement of sternoclavicular abscess.

EXAM:
CHEST - 2 VIEW

[w chest pa]
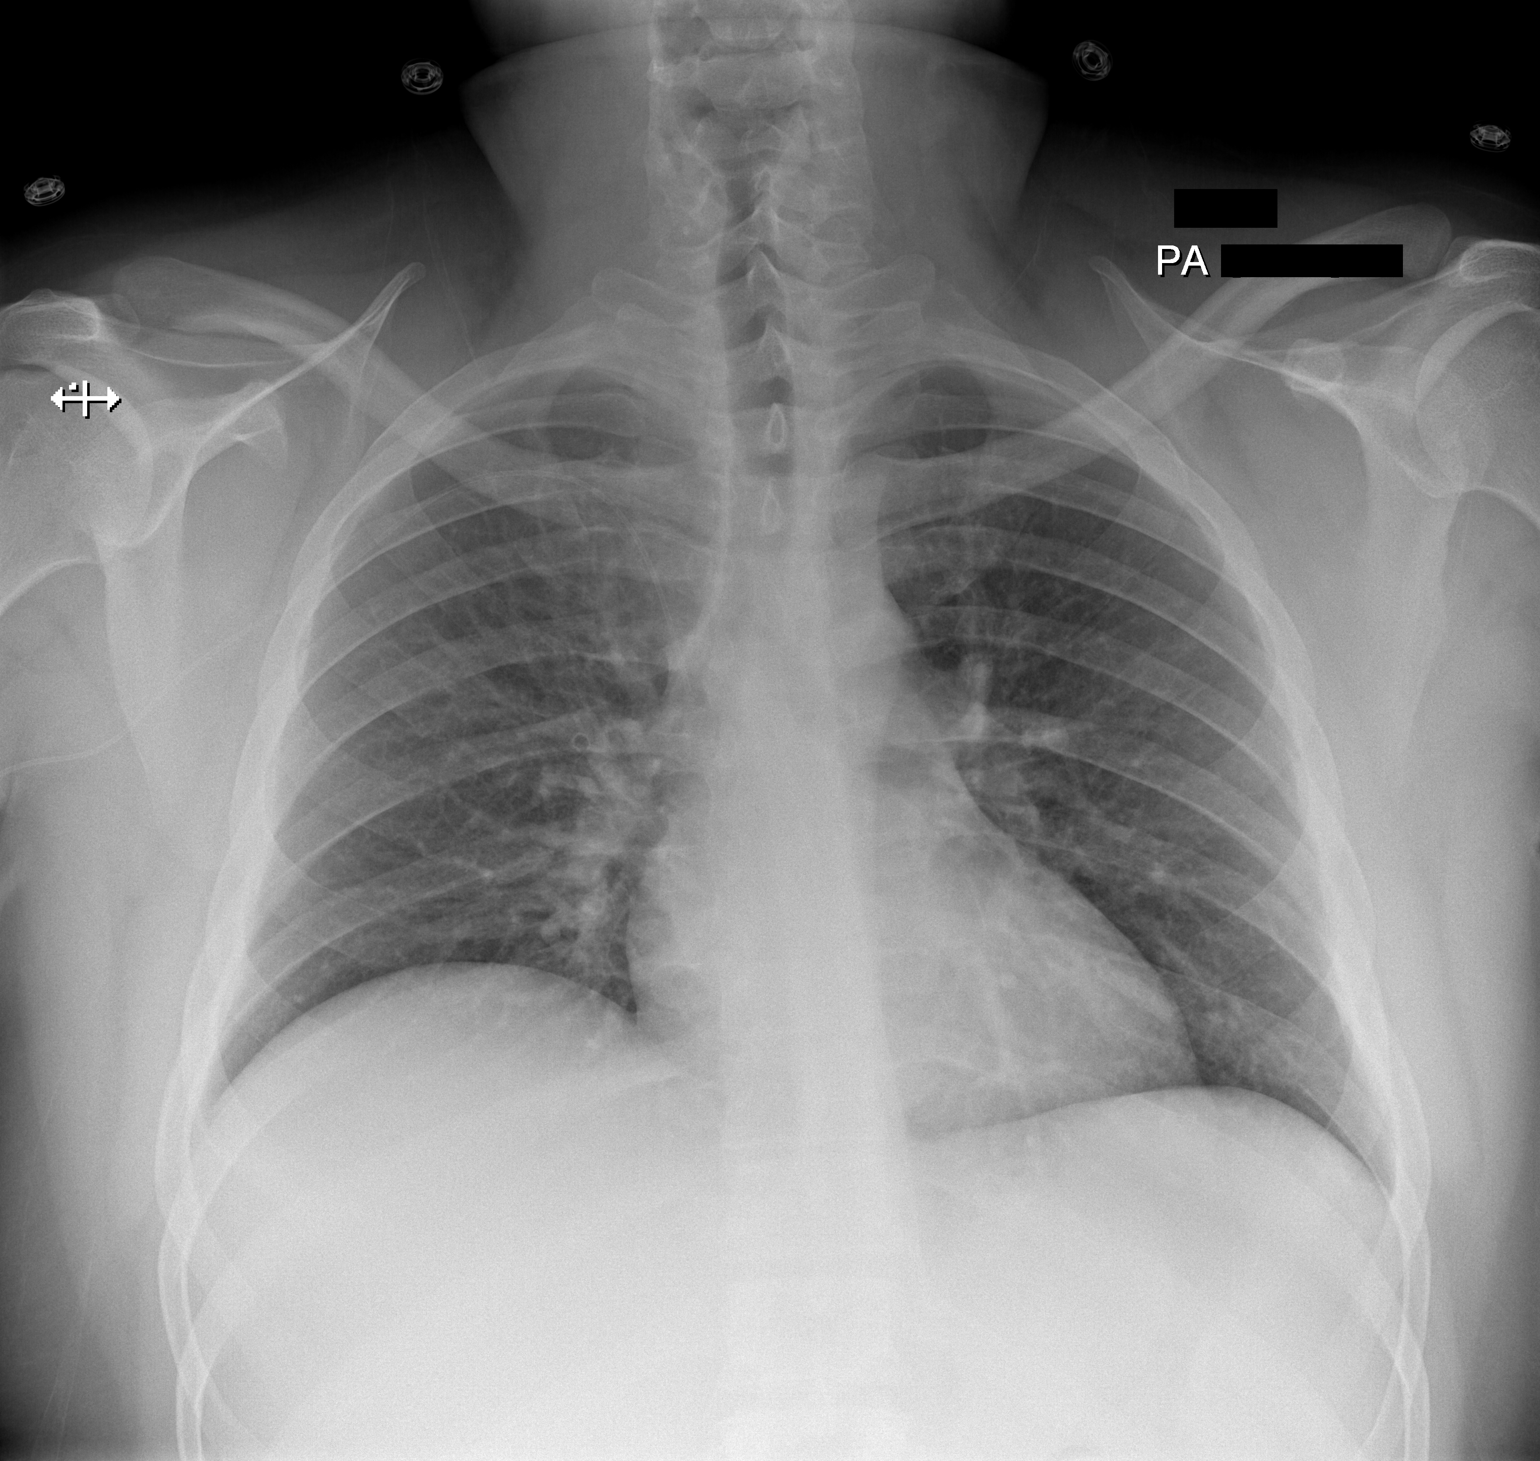

[w chest lat]
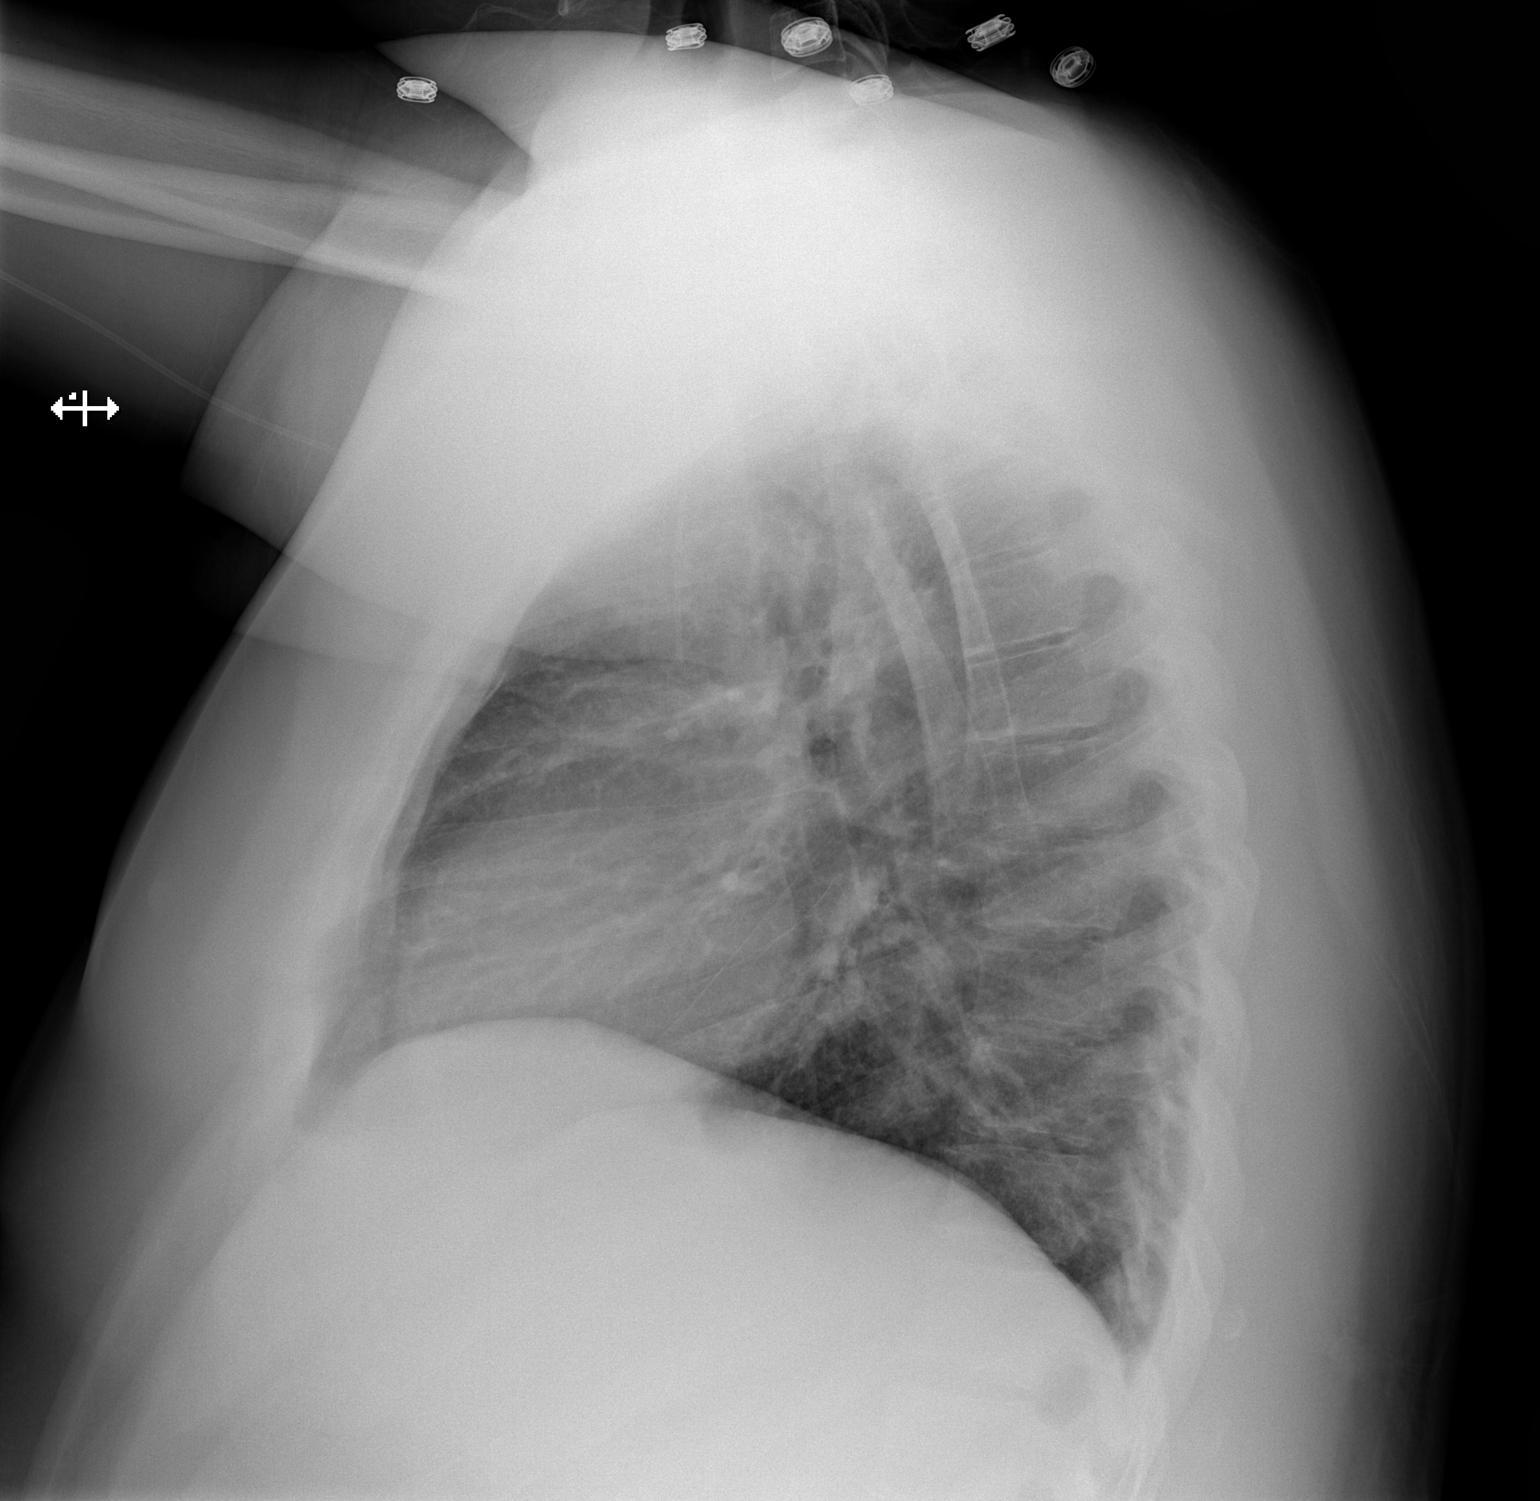

[2 of 2 positions shown; findings below may reference images not displayed]

FINDINGS: The heart size and mediastinal contours are within normal limits.
Both lungs are clear. The visualized skeletal structures are
unremarkable. Medial right clavicular erosions are obscured by
underlying soft tissues.
IMPRESSION: No active cardiopulmonary disease.

## 2019-06-20 SURGERY — IRRIGATION AND DEBRIDEMENT EXTREMITY
Anesthesia: General | Laterality: Right

## 2019-06-20 MED ORDER — FENTANYL CITRATE (PF) 250 MCG/5ML IJ SOLN
INTRAMUSCULAR | Status: DC | PRN
  Administered 2019-06-20 (×2): 50 ug via INTRAVENOUS
  Administered 2019-06-20: 100 ug via INTRAVENOUS
  Administered 2019-06-20: 50 ug via INTRAVENOUS

## 2019-06-20 MED ORDER — PHENYLEPHRINE HCL-NACL 10-0.9 MG/250ML-% IV SOLN
INTRAVENOUS | Status: DC | PRN
  Administered 2019-06-20: 25 ug/min via INTRAVENOUS

## 2019-06-20 MED ORDER — CLONIDINE HCL 0.1 MG PO TABS
0.2000 mg | ORAL_TABLET | Freq: Three times a day (TID) | ORAL | Status: DC
  Administered 2019-06-20 – 2019-06-24 (×13): 0.2 mg via ORAL
  Filled 2019-06-20 (×13): qty 2

## 2019-06-20 MED ORDER — ONDANSETRON HCL 4 MG/2ML IJ SOLN
INTRAMUSCULAR | Status: DC | PRN
  Administered 2019-06-20: 4 mg via INTRAVENOUS

## 2019-06-20 MED ORDER — LACTATED RINGERS IV SOLN: INTRAVENOUS | Status: DC | PRN

## 2019-06-20 MED ORDER — DEXAMETHASONE SODIUM PHOSPHATE 10 MG/ML IJ SOLN
INTRAMUSCULAR | Status: DC | PRN
  Administered 2019-06-20: 10 mg via INTRAVENOUS

## 2019-06-20 MED ORDER — PROPOFOL 10 MG/ML IV BOLUS
INTRAVENOUS | Status: AC
  Filled 2019-06-20: qty 20

## 2019-06-20 MED ORDER — SODIUM CHLORIDE 0.9 % IV SOLN: INTRAVENOUS | Status: DC | PRN

## 2019-06-20 MED ORDER — HYDROMORPHONE 1 MG/ML IV SOLN
INTRAVENOUS | Status: DC
  Administered 2019-06-20: 2.7 mg via INTRAVENOUS
  Administered 2019-06-20: 30 mg via INTRAVENOUS
  Administered 2019-06-20: 3.5 mg via INTRAVENOUS
  Administered 2019-06-21 (×3): 0.9 mg via INTRAVENOUS
  Administered 2019-06-21: 2.4 mg via INTRAVENOUS
  Administered 2019-06-21: 1.8 mg via INTRAVENOUS
  Administered 2019-06-22: 4.2 mg via INTRAVENOUS
  Administered 2019-06-22: 2.1 mg via INTRAVENOUS
  Administered 2019-06-22: 30 mg via INTRAVENOUS
  Administered 2019-06-22 (×2): 1.8 mg via INTRAVENOUS
  Administered 2019-06-22: 3 mg via INTRAVENOUS
  Administered 2019-06-23: 1.5 mg via INTRAVENOUS
  Administered 2019-06-23: 1.8 mg via INTRAVENOUS
  Filled 2019-06-20: qty 30

## 2019-06-20 MED ORDER — FENTANYL CITRATE (PF) 100 MCG/2ML IJ SOLN: 25.0000 ug | INTRAMUSCULAR | Status: DC | PRN

## 2019-06-20 MED ORDER — CELECOXIB 200 MG PO CAPS
200.0000 mg | ORAL_CAPSULE | Freq: Once | ORAL | Status: AC
  Administered 2019-06-20: 200 mg via ORAL
  Filled 2019-06-20: qty 1

## 2019-06-20 MED ORDER — MIDAZOLAM HCL 5 MG/5ML IJ SOLN
INTRAMUSCULAR | Status: DC | PRN
  Administered 2019-06-20: 2 mg via INTRAVENOUS

## 2019-06-20 MED ORDER — ACETAMINOPHEN 500 MG PO TABS
1000.0000 mg | ORAL_TABLET | Freq: Four times a day (QID) | ORAL | Status: DC
  Administered 2019-06-20 – 2019-06-24 (×13): 1000 mg via ORAL
  Filled 2019-06-20 (×14): qty 2

## 2019-06-20 MED ORDER — SUGAMMADEX SODIUM 200 MG/2ML IV SOLN
INTRAVENOUS | Status: DC | PRN
  Administered 2019-06-20: 200 mg via INTRAVENOUS

## 2019-06-20 MED ORDER — SENNOSIDES-DOCUSATE SODIUM 8.6-50 MG PO TABS
1.0000 | ORAL_TABLET | Freq: Every day | ORAL | Status: DC
  Administered 2019-06-21 – 2019-06-23 (×3): 1 via ORAL
  Filled 2019-06-20 (×3): qty 1

## 2019-06-20 MED ORDER — VANCOMYCIN HCL 1000 MG IV SOLR
INTRAVENOUS | Status: DC | PRN
  Administered 2019-06-20: 1000 mg

## 2019-06-20 MED ORDER — CEFAZOLIN SODIUM-DEXTROSE 2-4 GM/100ML-% IV SOLN
2.0000 g | Freq: Three times a day (TID) | INTRAVENOUS | Status: DC
  Administered 2019-06-20 – 2019-06-24 (×13): 2 g via INTRAVENOUS
  Filled 2019-06-20 (×13): qty 100

## 2019-06-20 MED ORDER — ALBUMIN HUMAN 5 % IV SOLN: INTRAVENOUS | Status: DC | PRN

## 2019-06-20 MED ORDER — DIPHENHYDRAMINE HCL 50 MG/ML IJ SOLN: 12.5000 mg | Freq: Four times a day (QID) | INTRAMUSCULAR | Status: DC | PRN

## 2019-06-20 MED ORDER — ONDANSETRON HCL 4 MG/2ML IJ SOLN
4.0000 mg | Freq: Four times a day (QID) | INTRAMUSCULAR | Status: DC | PRN
  Filled 2019-06-20: qty 2

## 2019-06-20 MED ORDER — SODIUM CHLORIDE 0.9 % IR SOLN
Status: DC | PRN
  Administered 2019-06-20: 2000 mL

## 2019-06-20 MED ORDER — SODIUM CHLORIDE 0.9% FLUSH: 9.0000 mL | INTRAVENOUS | Status: DC | PRN

## 2019-06-20 MED ORDER — ACETAMINOPHEN 500 MG PO TABS
1000.0000 mg | ORAL_TABLET | Freq: Once | ORAL | Status: AC
  Administered 2019-06-20: 1000 mg via ORAL
  Filled 2019-06-20: qty 2

## 2019-06-20 MED ORDER — ROCURONIUM BROMIDE 10 MG/ML (PF) SYRINGE
PREFILLED_SYRINGE | INTRAVENOUS | Status: AC
  Filled 2019-06-20: qty 10

## 2019-06-20 MED ORDER — KETOROLAC TROMETHAMINE 15 MG/ML IJ SOLN
15.0000 mg | Freq: Four times a day (QID) | INTRAMUSCULAR | Status: DC
  Administered 2019-06-21 (×5): 15 mg via INTRAVENOUS
  Filled 2019-06-20 (×5): qty 1

## 2019-06-20 MED ORDER — PHENYLEPHRINE 40 MCG/ML (10ML) SYRINGE FOR IV PUSH (FOR BLOOD PRESSURE SUPPORT)
PREFILLED_SYRINGE | INTRAVENOUS | Status: DC | PRN
  Administered 2019-06-20: 120 ug via INTRAVENOUS
  Administered 2019-06-20: 80 ug via INTRAVENOUS
  Administered 2019-06-20: 120 ug via INTRAVENOUS
  Administered 2019-06-20: 80 ug via INTRAVENOUS

## 2019-06-20 MED ORDER — SERTRALINE HCL 25 MG PO TABS
50.0000 mg | ORAL_TABLET | Freq: Every day | ORAL | Status: DC
  Administered 2019-06-21 – 2019-06-24 (×4): 50 mg via ORAL
  Filled 2019-06-20 (×5): qty 2

## 2019-06-20 MED ORDER — RISPERIDONE 2 MG PO TABS
4.0000 mg | ORAL_TABLET | Freq: Every day | ORAL | Status: DC
  Administered 2019-06-20 – 2019-06-23 (×4): 4 mg via ORAL
  Filled 2019-06-20 (×5): qty 2

## 2019-06-20 MED ORDER — TRAZODONE HCL 50 MG PO TABS
150.0000 mg | ORAL_TABLET | Freq: Every day | ORAL | Status: DC
  Administered 2019-06-20 – 2019-06-23 (×4): 150 mg via ORAL
  Filled 2019-06-20 (×4): qty 3

## 2019-06-20 MED ORDER — FENTANYL CITRATE (PF) 250 MCG/5ML IJ SOLN
INTRAMUSCULAR | Status: AC
  Filled 2019-06-20: qty 5

## 2019-06-20 MED ORDER — HEMOSTATIC AGENTS (NO CHARGE) OPTIME
TOPICAL | Status: DC | PRN
  Administered 2019-06-20: 3 via TOPICAL

## 2019-06-20 MED ORDER — LIDOCAINE 2% (20 MG/ML) 5 ML SYRINGE
INTRAMUSCULAR | Status: DC | PRN
  Administered 2019-06-20: 100 mg via INTRAVENOUS

## 2019-06-20 MED ORDER — CEFAZOLIN IV (FOR PTA / DISCHARGE USE ONLY): 2.0000 g | Freq: Three times a day (TID) | INTRAVENOUS | Status: DC

## 2019-06-20 MED ORDER — MIDAZOLAM HCL 2 MG/2ML IJ SOLN
INTRAMUSCULAR | Status: AC
  Filled 2019-06-20: qty 2

## 2019-06-20 MED ORDER — TRAMADOL HCL 50 MG PO TABS
50.0000 mg | ORAL_TABLET | Freq: Four times a day (QID) | ORAL | Status: DC | PRN
  Administered 2019-06-23 – 2019-06-24 (×3): 50 mg via ORAL
  Filled 2019-06-20 (×3): qty 1

## 2019-06-20 MED ORDER — LITHIUM CARBONATE ER 450 MG PO TBCR
750.0000 mg | EXTENDED_RELEASE_TABLET | Freq: Every day | ORAL | Status: DC
  Administered 2019-06-20 – 2019-06-23 (×4): 750 mg via ORAL
  Filled 2019-06-20 (×5): qty 1

## 2019-06-20 MED ORDER — PROPOFOL 10 MG/ML IV BOLUS
INTRAVENOUS | Status: DC | PRN
  Administered 2019-06-20: 200 mg via INTRAVENOUS

## 2019-06-20 MED ORDER — ATORVASTATIN CALCIUM 10 MG PO TABS
10.0000 mg | ORAL_TABLET | Freq: Every day | ORAL | Status: DC
  Administered 2019-06-20 – 2019-06-23 (×4): 10 mg via ORAL
  Filled 2019-06-20 (×4): qty 1

## 2019-06-20 MED ORDER — SUCCINYLCHOLINE CHLORIDE 200 MG/10ML IV SOSY
PREFILLED_SYRINGE | INTRAVENOUS | Status: AC
  Filled 2019-06-20: qty 10

## 2019-06-20 MED ORDER — PROMETHAZINE HCL 25 MG/ML IJ SOLN: 6.2500 mg | INTRAMUSCULAR | Status: DC | PRN

## 2019-06-20 MED ORDER — LACTATED RINGERS IV SOLN
INTRAVENOUS | Status: DC | PRN
Start: 2019-06-20 — End: 2019-06-20

## 2019-06-20 MED ORDER — HYDROMORPHONE 1 MG/ML IV SOLN
INTRAVENOUS | Status: AC
  Filled 2019-06-20: qty 30

## 2019-06-20 MED ORDER — LIDOCAINE 2% (20 MG/ML) 5 ML SYRINGE
INTRAMUSCULAR | Status: AC
  Filled 2019-06-20: qty 5

## 2019-06-20 MED ORDER — DIPHENHYDRAMINE HCL 12.5 MG/5ML PO ELIX: 12.5000 mg | ORAL_SOLUTION | Freq: Four times a day (QID) | ORAL | Status: DC | PRN

## 2019-06-20 MED ORDER — SUCCINYLCHOLINE CHLORIDE 200 MG/10ML IV SOSY
PREFILLED_SYRINGE | INTRAVENOUS | Status: DC | PRN
Start: 2019-06-20 — End: 2019-06-20
  Administered 2019-06-20: 150 mg via INTRAVENOUS

## 2019-06-20 MED ORDER — ADULT MULTIVITAMIN W/MINERALS CH
1.0000 | ORAL_TABLET | Freq: Every day | ORAL | Status: DC
  Administered 2019-06-20 – 2019-06-24 (×5): 1 via ORAL
  Filled 2019-06-20 (×5): qty 1

## 2019-06-20 MED ORDER — ROCURONIUM BROMIDE 10 MG/ML (PF) SYRINGE
PREFILLED_SYRINGE | INTRAVENOUS | Status: DC | PRN
  Administered 2019-06-20: 10 mg via INTRAVENOUS
  Administered 2019-06-20: 40 mg via INTRAVENOUS
  Administered 2019-06-20: 10 mg via INTRAVENOUS

## 2019-06-20 MED ORDER — PANTOPRAZOLE SODIUM 40 MG PO TBEC
40.0000 mg | DELAYED_RELEASE_TABLET | Freq: Every day | ORAL | Status: DC
  Administered 2019-06-21 – 2019-06-24 (×4): 40 mg via ORAL
  Filled 2019-06-20 (×5): qty 1

## 2019-06-20 MED ORDER — ACETAMINOPHEN 160 MG/5ML PO SOLN: 1000.0000 mg | Freq: Four times a day (QID) | ORAL | Status: DC

## 2019-06-20 MED ORDER — POTASSIUM CHLORIDE IN NACL 20-0.9 MEQ/L-% IV SOLN
INTRAVENOUS | Status: DC
  Filled 2019-06-20 (×2): qty 1000

## 2019-06-20 MED ORDER — NALOXONE HCL 0.4 MG/ML IJ SOLN
0.4000 mg | INTRAMUSCULAR | Status: DC | PRN
  Filled 2019-06-20: qty 1

## 2019-06-20 MED ORDER — OXYCODONE HCL 5 MG PO TABS
5.0000 mg | ORAL_TABLET | ORAL | Status: DC | PRN
  Administered 2019-06-20 – 2019-06-23 (×9): 5 mg via ORAL
  Filled 2019-06-20 (×9): qty 1

## 2019-06-20 MED ORDER — BISACODYL 5 MG PO TBEC
10.0000 mg | DELAYED_RELEASE_TABLET | Freq: Every day | ORAL | Status: DC
  Administered 2019-06-20 – 2019-06-24 (×4): 10 mg via ORAL
  Filled 2019-06-20 (×5): qty 2

## 2019-06-20 MED ORDER — VANCOMYCIN HCL 1000 MG IV SOLR
INTRAVENOUS | Status: AC
  Filled 2019-06-20: qty 1000

## 2019-06-20 MED ORDER — ONDANSETRON HCL 4 MG/2ML IJ SOLN
INTRAMUSCULAR | Status: AC
  Filled 2019-06-20: qty 2

## 2019-06-20 MED ORDER — PHENYLEPHRINE 40 MCG/ML (10ML) SYRINGE FOR IV PUSH (FOR BLOOD PRESSURE SUPPORT)
PREFILLED_SYRINGE | INTRAVENOUS | Status: AC
  Filled 2019-06-20: qty 10

## 2019-06-20 MED ORDER — SODIUM CHLORIDE 0.9% IV SOLUTION: Freq: Once | INTRAVENOUS | Status: DC

## 2019-06-20 SURGICAL SUPPLY — 71 items
ANCHOR CATH FOLEY SECURE (MISCELLANEOUS) ×2 IMPLANT
APL SKNCLS STERI-STRIP NONHPOA (GAUZE/BANDAGES/DRESSINGS)
BENZOIN TINCTURE PRP APPL 2/3 (GAUZE/BANDAGES/DRESSINGS) IMPLANT
BLADE CLIPPER SURG (BLADE) ×1 IMPLANT
BLADE SURG 10 STRL SS (BLADE) IMPLANT
BLADE SURG 15 STRL LF DISP TIS (BLADE) IMPLANT
BLADE SURG 15 STRL SS (BLADE)
BNDG ELASTIC 4X5.8 VLCR STR LF (GAUZE/BANDAGES/DRESSINGS) IMPLANT
BNDG ELASTIC 6X5.8 VLCR STR LF (GAUZE/BANDAGES/DRESSINGS) IMPLANT
BNDG GAUZE ELAST 4 BULKY (GAUZE/BANDAGES/DRESSINGS) IMPLANT
CANISTER SUCT 3000ML PPV (MISCELLANEOUS) ×3 IMPLANT
CANISTER WOUND CARE 500ML ATS (WOUND CARE) ×3 IMPLANT
CLIP VESOCCLUDE SM WIDE 24/CT (CLIP) IMPLANT
CNTNR URN SCR LID CUP LEK RST (MISCELLANEOUS) IMPLANT
CONT SPEC 4OZ STRL OR WHT (MISCELLANEOUS) ×6
COVER SURGICAL LIGHT HANDLE (MISCELLANEOUS) ×2 IMPLANT
DRAPE CHEST BREAST 15X10 FENES (DRAPES) ×5 IMPLANT
DRAPE LAPAROSCOPIC ABDOMINAL (DRAPES) ×1 IMPLANT
DRAPE SLUSH/WARMER DISC (DRAPES) ×2 IMPLANT
DRSG AQUACEL AG ADV 3.5X14 (GAUZE/BANDAGES/DRESSINGS) ×1 IMPLANT
DRSG PAD ABDOMINAL 8X10 ST (GAUZE/BANDAGES/DRESSINGS) IMPLANT
DRSG VAC ATS LRG SENSATRAC (GAUZE/BANDAGES/DRESSINGS) ×1 IMPLANT
DRSG VAC ATS MED SENSATRAC (GAUZE/BANDAGES/DRESSINGS) ×1 IMPLANT
DRSG VAC ATS SM SENSATRAC (GAUZE/BANDAGES/DRESSINGS) ×3 IMPLANT
DRSG VERSA FOAM LRG 10X15 (GAUZE/BANDAGES/DRESSINGS) ×2 IMPLANT
ELECT REM PT RETURN 9FT ADLT (ELECTROSURGICAL) ×3
ELECTRODE REM PT RTRN 9FT ADLT (ELECTROSURGICAL) ×1 IMPLANT
GAUZE SPONGE 4X4 12PLY STRL (GAUZE/BANDAGES/DRESSINGS) ×1 IMPLANT
GAUZE XEROFORM 5X9 LF (GAUZE/BANDAGES/DRESSINGS) IMPLANT
GLOVE BIO SURGEON STRL SZ 6.5 (GLOVE) ×1 IMPLANT
GLOVE BIO SURGEON STRL SZ7.5 (GLOVE) ×6 IMPLANT
GLOVE BIO SURGEONS STRL SZ 6.5 (GLOVE) ×1
GLOVE BIOGEL PI IND STRL 6.5 (GLOVE) IMPLANT
GLOVE BIOGEL PI INDICATOR 6.5 (GLOVE) ×6
GOWN STRL REUS W/ TWL LRG LVL3 (GOWN DISPOSABLE) ×2 IMPLANT
GOWN STRL REUS W/TWL LRG LVL3 (GOWN DISPOSABLE) ×12
HANDPIECE INTERPULSE COAX TIP (DISPOSABLE) ×3
HEMOSTAT POWDER SURGIFOAM 1G (HEMOSTASIS) IMPLANT
HEMOSTAT SURGICEL 2X14 (HEMOSTASIS) IMPLANT
KIT BASIN OR (CUSTOM PROCEDURE TRAY) ×3 IMPLANT
KIT SUCTION CATH 14FR (SUCTIONS) IMPLANT
KIT TURNOVER KIT B (KITS) ×3 IMPLANT
NDL HYPO 25GX1X1/2 BEV (NEEDLE) IMPLANT
NEEDLE HYPO 25GX1X1/2 BEV (NEEDLE) IMPLANT
NS IRRIG 1000ML POUR BTL (IV SOLUTION) ×5 IMPLANT
PACK GENERAL/GYN (CUSTOM PROCEDURE TRAY) ×3 IMPLANT
PAD ARMBOARD 7.5X6 YLW CONV (MISCELLANEOUS) ×6 IMPLANT
PENCIL BUTTON HOLSTER BLD 10FT (ELECTRODE) ×2 IMPLANT
SET HNDPC FAN SPRY TIP SCT (DISPOSABLE) ×1 IMPLANT
SPONGE LAP 4X18 RFD (DISPOSABLE) ×4 IMPLANT
STAPLER VISISTAT 35W (STAPLE) IMPLANT
SUT ETHILON 3 0 FSL (SUTURE) IMPLANT
SUT PROLENE 3 0 SH1 36 (SUTURE) ×2 IMPLANT
SUT PROLENE 4 0 RB 1 (SUTURE) ×12
SUT PROLENE 4-0 RB1 .5 CRCL 36 (SUTURE) IMPLANT
SUT VIC AB 1 CTX 36 (SUTURE)
SUT VIC AB 1 CTX36XBRD ANBCTR (SUTURE) IMPLANT
SUT VIC AB 2-0 CTX 27 (SUTURE) IMPLANT
SUT VIC AB 2-0 CTX 36 (SUTURE) IMPLANT
SUT VIC AB 3-0 SH 27 (SUTURE)
SUT VIC AB 3-0 SH 27X BRD (SUTURE) IMPLANT
SUT VIC AB 3-0 X1 27 (SUTURE) ×1 IMPLANT
SWAB COLLECTION DEVICE MRSA (MISCELLANEOUS) IMPLANT
SWAB CULTURE ESWAB REG 1ML (MISCELLANEOUS) IMPLANT
SYR CONTROL 10ML LL (SYRINGE) IMPLANT
TOWEL GREEN STERILE (TOWEL DISPOSABLE) ×3 IMPLANT
TOWEL GREEN STERILE FF (TOWEL DISPOSABLE) ×1 IMPLANT
TRAY FOLEY MTR SLVR 16FR STAT (SET/KITS/TRAYS/PACK) IMPLANT
TUBE CONNECTING 20'X1/4 (TUBING) ×1
TUBE CONNECTING 20X1/4 (TUBING) ×1 IMPLANT
WATER STERILE IRR 1000ML POUR (IV SOLUTION) ×3 IMPLANT

## 2019-06-20 NOTE — Consult Note (Signed)
[facility]    Date of Admission:  06/20/2019     Total days of antibiotics 20                Reason for Consult: MSSA Bacteremia / Septic arthritis right sternoclavicular joint and right knee   Referring Provider: [place] Primary Care Provider: Ignatius Specking, MD   ASSESSMENT:  Herbert Walsh is a 29 y/o male with worsening disseminated MSSA infection secondary to burden of infection despite treatment with prolonged Cefazolin now s/p repeat I&D of his right sternoclavicular joint. Surgical specimen gram stain and cultures are pending. Right knee improved from previous I&D. Plans to return to OR on 5/10 for repeat I&D of right sternoclavicular joint. Continue current dose of Ancef. Will need continued prolonged course of antibiotics of 6 weeks with start date depending on last date of surgery.   PLAN:  1. Continue current dose of Cefazolin. 2. Monitor intraoperative cultures 3. Wound care per Dr. Maudie Flakes / CVTS.    Active Problems:   Septic arthritis of right sternoclavicular joint (HCC)   Osteomyelitis (HCC)   . acetaminophen  1,000 mg Oral Q6H   Or  . acetaminophen (TYLENOL) oral liquid 160 mg/5 mL  1,000 mg Oral Q6H  . atorvastatin  10 mg Oral QHS  . bisacodyl  10 mg Oral Daily  . cloNIDine  0.2 mg Oral TID  . HYDROmorphone   Intravenous Q4H  . HYDROmorphone      . lithium carbonate  750 mg Oral QHS  . multivitamin with minerals  1 tablet Oral Daily  . pantoprazole  40 mg Oral Daily  . risperidone  4 mg Oral QHS  . senna-docusate  1 tablet Oral QHS  . sertraline  50 mg Oral Daily  . traZODone  150 mg Oral QHS     HPI: Herbert Walsh is a 29 y.o. male with previous medical history of anxiety/depression, bipolar disorder, schizophrenia, hypertension, and recent disseminated MSSA infection admitted for worsening septic arthritis of his right sternoclavicular joint.  Herbert Walsh was initially seen on 05/31/19 by Dr. Luciana Axe for MSSA bacteremia as a  CHAMP autoconsult following irrigation and debridement of his right knee by Dr. Roda Shutters and right sternoclavicular joint by Dr. [name]. TEE completed on 06/05/19 was without evidence of endocarditis. Treatment course of 6 weeks of Cefazolin for disseminated MSSA infection. Followed in the clinic on 5/5 and had development of abscess with some osteomyelitis of the clavicular head despite prolonged antibiotics and was scheduled for repeat debridement.   Herbert Walsh has remained afebrile since admission. Intraoperative specimens obtain and are pending for gram stain, culture and AFB. Right knee is feeling better. Having pain in his right sternoclavicular joint. Taking the Cefazolin as prescribed. Plans to return to the OR on 5/10.   Review of Systems: Review of Systems  Constitutional: Negative for chills, fever and weight loss.  Respiratory: Negative for cough, shortness of breath and wheezing.   Cardiovascular: Negative for chest pain and leg swelling.  Gastrointestinal: Negative for abdominal pain, constipation, diarrhea, nausea and vomiting.  Musculoskeletal:       Positive for right [place] joint pain.   Skin: Negative for rash.     Past Medical History:  Diagnosis Date  . Anemia   . Anxiety   . Arthritis    rheumatoid  . Bipolar disorder (HCC)   . Depression   . GERD (gastroesophageal reflux disease)   . Hypertension 2019  .  Ischemic colitis ([place]) 09/2008  . Schizophrenia ([employer])   . Tachycardia     Social History   Tobacco Use  . Smoking status: Current Every Day Smoker    Packs/day: 0.50    Years: 11.00    Pack years: 5.50    Types: Cigarettes  . Smokeless tobacco: Never Used  Substance Use Topics  . Alcohol use: No    Comment: one beer every 2-3 months  . Drug use: No    Family History  Problem Relation Age of Onset  . Alcohol abuse Mother   . Depression Mother   . Drug abuse Mother   . Physical abuse Mother   . Sexual abuse Mother   . ADD / ADHD Paternal Aunt   .  Alcohol abuse Maternal Grandfather   . Bipolar disorder Maternal Grandmother   . Schizophrenia Maternal Grandmother   . Drug abuse Maternal Grandmother   . Alcohol abuse Paternal Grandfather   . Depression Paternal Grandfather   . Depression Paternal Grandmother   . Drug abuse Paternal Grandmother     No Known Allergies  OBJECTIVE: Blood pressure 104/67, pulse 92, temperature 97.7 F (36.5 C), temperature source Oral, resp. rate 11, SpO2 97 %.  Physical Exam Constitutional:      General: He is not in acute distress.    Appearance: He is well-developed.  Cardiovascular:     Rate and Rhythm: Normal rate and regular rhythm.     Heart sounds: Normal heart sounds.  Pulmonary:     Effort: Pulmonary effort is normal.     Breath sounds: Normal breath sounds.  Skin:    General: Skin is warm and dry.  Neurological:     Mental Status: He is alert and oriented to person, place, and time.  Psychiatric:        Behavior: Behavior normal.        Thought Content: Thought content normal.        Judgment: Judgment normal.     Lab Results Lab Results  Component Value Date   WBC 9.7 06/20/2019   HGB 8.7 (L) 06/20/2019   HCT 28.7 (L) 06/20/2019   MCV 79.9 (L) 06/20/2019   PLT 518 (H) 06/20/2019    Lab Results  Component Value Date   CREATININE 0.77 06/20/2019   BUN 7 06/20/2019   NA 132 (L) 06/20/2019   K 4.0 06/20/2019   CL 97 (L) 06/20/2019   CO2 23 06/20/2019    Lab Results  Component Value Date   ALT 26 06/20/2019   AST 23 06/20/2019   ALKPHOS 96 06/20/2019   BILITOT 0.5 06/20/2019     Microbiology: Recent Results (from the past 240 hour(s))  SARS CORONAVIRUS 2 (TAT 6-24 HRS) Nasopharyngeal Nasopharyngeal Swab     Status: None   Collection Time: 06/18/19 11:47 AM   Specimen: Nasopharyngeal Swab  Result Value Ref Range Status   SARS Coronavirus 2 NEGATIVE NEGATIVE Final    Comment: (NOTE) SARS-CoV-2 target nucleic acids are NOT DETECTED. The SARS-CoV-2 RNA is  generally detectable in upper and lower respiratory specimens during the acute phase of infection. Negative results do not preclude SARS-CoV-2 infection, do not rule out co-infections with other pathogens, and should not be used as the sole basis for treatment or other patient management decisions. Negative results must be combined with clinical observations, patient history, and epidemiological information. The expected result is Negative. Fact Sheet for Patients: SugarRoll.be Fact Sheet for Healthcare Providers: https://www.woods-mathews.com/ This test is not yet approved  or cleared by the [place] and  has been authorized for detection and/or diagnosis of SARS-CoV-2 by FDA under an Emergency Use Authorization (EUA). This EUA will remain  in effect (meaning this test can be used) for the duration of the COVID-19 declaration under Section 56 4(b)(1) of the Act, 21 U.S.C. section 360bbb-3(b)(1), unless the authorization is terminated or revoked sooner. Performed at [facility], [address], [place], [place] [address]      Marcos Eke, NP [facility]  06/20/2019  2:29 PM

## 2019-06-20 NOTE — Telephone Encounter (Signed)
See message.

## 2019-06-20 NOTE — Telephone Encounter (Signed)
PA, Doree Fudge at [facility] would like for Dr. Roda Shutters or Mardella Layman to stop by to see patient's wound and would like to know if stitches can be removed?  Stated that patient will be in the hospital for a couple of days.  Cb# [phone].  Please advise.  Thank you.

## 2019-06-20 NOTE — Telephone Encounter (Signed)
Please see below.

## 2019-06-20 NOTE — Transfer of Care (Signed)
Immediate Anesthesia Transfer of Care Note  Patient: Herbert Walsh  Procedure(s) Performed: DEBRIDEMENT OF STERNOCLAVICULAR JOINT ABSCESS (Right ) APPLICATION OF WOUND VAC (Right )  Patient Location: PACU  Anesthesia Type:General  Level of Consciousness: awake, alert  and oriented  Airway & Oxygen Therapy: Patient Spontanous Breathing  Post-op Assessment: Report given to RN, Post -op Vital signs reviewed and stable and Patient moving all extremities X 4  Post vital signs: Reviewed and stable  Last Vitals:  Vitals Value Taken Time  BP 131/61 06/20/19 1001  Temp    Pulse 107 06/20/19 1002  Resp 25 06/20/19 1002  SpO2 99 % 06/20/19 1002  Vitals shown include unvalidated device data.  Last Pain:  Vitals:   06/20/19 0614  PainSc: 5          Complications: No apparent anesthesia complications

## 2019-06-20 NOTE — Anesthesia Postprocedure Evaluation (Signed)
Anesthesia Post Note  Patient: Herbert Walsh  Procedure(s) Performed: DEBRIDEMENT OF STERNOCLAVICULAR JOINT ABSCESS (Right ) APPLICATION OF WOUND VAC (Right )     Patient location during evaluation: PACU Anesthesia Type: General Level of consciousness: sedated Pain management: pain level controlled Vital Signs Assessment: post-procedure vital signs reviewed and stable Respiratory status: spontaneous breathing and respiratory function stable Cardiovascular status: stable Postop Assessment: no apparent nausea or vomiting Anesthetic complications: no    Last Vitals:  Vitals:   06/20/19 1015 06/20/19 1030  BP: 116/61 108/69  Pulse: (!) 105 (!) 106  Resp: (!) 24 (!) 23  Temp:    SpO2: 95% 99%    Last Pain:  Vitals:   06/20/19 1030  PainSc: 8                  Jennilyn Esteve DANIEL

## 2019-06-20 NOTE — Progress Notes (Signed)
Subjective: Day of Surgery Procedure(s) (LRB): DEBRIDEMENT OF STERNOCLAVICULAR JOINT ABSCESS (Right) APPLICATION OF WOUND VAC (Right) Patient reports pain as moderate.    Objective: Vital signs in last 24 hours: Temp:  [97.6 F (36.4 C)-98.2 F (36.8 C)] 98 F (36.7 C) (05/07 1030) Pulse Rate:  [96-111] 111 (05/07 1235) Resp:  [16-24] 17 (05/07 1235) BP: (102-140)/(61-98) 119/71 (05/07 1235) SpO2:  [94 %-100 %] 100 % (05/07 1235)  Intake/Output from previous day: No intake/output data recorded. Intake/Output this shift: Total I/O In: 2100 [I.V.:1800; IV Piggyback:300] Out: 1325 [Urine:1000; Drains:25; Blood:300]  Recent Labs    06/20/19 0555  HGB 8.7*   Recent Labs    06/20/19 0555  WBC 9.7  RBC 3.59*  HCT 28.7*  PLT 518*   Recent Labs    06/20/19 0555  NA 132*  K 4.0  CL 97*  CO2 23  BUN 7  CREATININE 0.77  GLUCOSE 162*  CALCIUM 8.7*   Recent Labs    06/20/19 0555  INR 1.1    Neurologically intact Neurovascular intact Sensation intact distally Intact pulses distally Dorsiflexion/Plantar flexion intact Incision: dressing C/D/I No cellulitis present Compartment soft  Limited ROM secondary to pain and stiffness   Assessment/Plan: Day of Surgery Procedure(s) (LRB): DEBRIDEMENT OF STERNOCLAVICULAR JOINT ABSCESS (Right) APPLICATION OF WOUND VAC (Right) Up with therapy  WBAT RLE Intra-op cx mssa  Continue with abx  Please remove right knee sutures F/u with Dr. Roda Shutters in 4 weeks      Cristie Hem 06/20/2019, 1:24 PM

## 2019-06-20 NOTE — H&P (Signed)
PCP is Glenda Chroman, MD Referring Provider is No ref. provider found  No chief complaint on file.   HPI: Patient returns for outpatient follow-up after hospitalization for MSSA bacteremia with cellulitis of the right sternoclavicular joint.  He has history of bipolar disorder, substance abuse and smoking.  During the admission he had no discrete abscess and his white count and markers of infection improved on IV antibiotics.  He also needed right knee arthroscopy and irrigation by orthopedics for infection.  Echocardiogram showed no evidence of endocarditis.  Patient was discharged with a PICC line for home IV antibiotics and further follow-up with repeat scan was scheduled.  The patient has been on home IV Ancef.  He has had no fever but the pain in his right shoulder has increased.  CT scan of the chest shows the sternoclavicular joint now to have evidence of osteomyelitis with some bone destruction and fluid collection. No pleural effusion or deep mediastinal involvement  Patient will need surgical debridement of the right sternoclavicular joint under general anesthesia.  This will be scheduled within 48 hours at [facility].  I have discussed the procedure with the patient including the benefits alternatives and risks of bleeding and recurrent infection.  He agrees to be admitted for surgery of his right sternoclavicular joint.   Past Medical History:  Diagnosis Date  . Anemia   . Anxiety   . Arthritis    rheumatoid  . Bipolar disorder ([place])   . Depression   . GERD (gastroesophageal reflux disease)   . Hypertension 2019  . Ischemic colitis ([place]) 09/2008  . Schizophrenia ([place])   . Tachycardia     Past Surgical History:  Procedure Laterality Date  . APPENDECTOMY    . I & D EXTREMITY Right 05/31/2019   Procedure: IRRIGATION AND DEBRIDEMENT LOWER EXTREMITY;  Surgeon: Leandrew Koyanagi, MD;  Location: [facility];  Service: Orthopedics;  Laterality: Right;  . TEE WITHOUT CARDIOVERSION N/A  06/05/2019   Procedure: TRANSESOPHAGEAL ECHOCARDIOGRAM (TEE);  Surgeon: Lelon Perla, MD;  Location[facility]COPY;  Service: Cardiovascular;  Laterality: N/A;    Family History  Problem Relation Age of Onset  . Alcohol abuse Mother   . Depression Mother   . Drug abuse Mother   . Physical abuse Mother   . Sexual abuse Mother   . ADD / ADHD Paternal Aunt   . Alcohol abuse Maternal Grandfather   . Bipolar disorder Maternal Grandmother   . Schizophrenia Maternal Grandmother   . Drug abuse Maternal Grandmother   . Alcohol abuse Paternal Grandfather   . Depression Paternal Grandfather   . Depression Paternal Grandmother   . Drug abuse Paternal Grandmother     Social History Social History   Tobacco Use  . Smoking status: Current Every Day Smoker    Packs/day: 0.50    Years: 11.00    Pack years: 5.50    Types: Cigarettes  . Smokeless tobacco: Never Used  Substance Use Topics  . Alcohol use: No    Comment: one beer every 2-3 months  . Drug use: No    Current Facility-Administered Medications  Medication Dose Route Frequency Provider Last Rate Last Admin  . ceFAZolin (ANCEF) 3 g in dextrose 5 % 50 mL IVPB  3 g Intravenous 30 min Pre-Op Prescott Gum, Bryston Colocho, MD      . sodium chloride irrigation 0.9 %    PRN Prescott Gum, Collier Salina, MD   2,000 mL at 06/20/19 0710   Facility-Administered Medications Ordered  in Other Encounters  Medication Dose Route Frequency Provider Last Rate Last Admin  . lactated ringers infusion   Intravenous Continuous PRN Nils Pyle, CRNA   New Bag at 06/20/19 [phone]    No Known Allergies  Review of Systems  No fever Able to walk but his right knee is still painful No syncope Smoking 1 to 5 cigarettes a day No headache or change in vision No difficulty breathing No palpitations No abdominal pain  BP (!) 140/98   Pulse (!) 103   Temp 98.2 F (36.8 C)   Resp 18  Physical Exam      Exam    General- alert and comfortable    Neck- no JVD, no  cervical adenopathy palpable, no carotid bruit   Lungs- clear without rales, wheezes.  Right sternoclavicular joint without erythema but with mild swelling and a soft tissue defect noted over the clavicular head.  No drainage or fluctuance   Cor- regular rate and rhythm, no murmur , gallop   Abdomen- soft, non-tender   Extremities - warm, non-tender, minimal edema   Neuro- oriented, appropriate, no focal weakness   Diagnostic Tests: Images of chest CT scan personally reviewed and results as noted above.  Impression: MSSA infection of right sternoclavicular joint Despite prolonged IV antibiotics the infection has progressed to a discrete abscess with some osteomyelitis of the clavicular head which needs to be debrided in the OR  Plan: Patient will be admitted to [facility] hospital on May 7 for surgery. Continue IV antibiotics until admission.   [name], MD [facility]   06-20-19 UPDATE  No change in exam- R [place] joint  more tender and edematous Patient ready for I/D of Right [place] joint  P [name] MD [phone]

## 2019-06-20 NOTE — Telephone Encounter (Signed)
Medication management - Telephone call from pt's Mother who just wanted to let Dr. Vanetta Shawl know her son is currently in [facility] with and infection and was recently diagnosed with Rheumatoid Arthritis and in the hospital prior to that as well. Collateral wanted to let Dr. Vanetta Shawl know that patient's anxiety is up and may need some medication adjustments once he comes home which she thinks may be by Tuesday 06/24/19 or Wednesday 06/25/19.  Informed collateral this message would be sent to Dr.Hisada with her report and requested they call our office back if any problems once patient returns home as well.

## 2019-06-20 NOTE — OR Nursing (Signed)
TWO PIECES OF WHITE FOAM AND ONE PIECE OF BLACK FOAM IN PLACE WITH WOUND VAC APPLICATION. RADIOLOGY IN TO PERFORM CHEST X-RAY TO R/O PNEUMO. RESULTS REVIEWED BY DR. Zenaida Niece TRIGT. PT OK TO TRANSFER TO PACU.

## 2019-06-20 NOTE — Brief Op Note (Signed)
06/20/2019  9:38 AM  PATIENT:  Herbert Walsh  29 y.o. male  PRE-OPERATIVE DIAGNOSIS:  1.MSSA BACTEREMIA 2.RIGHT STERNOCLAVICULAR JOINT OSTEOMYELITIS  POST-OPERATIVE DIAGNOSIS:  1. MSSA BACTEREMIA 2. RIGHT STERNOCLAVICULAR JOINT OSTEOMYELITIS  PROCEDURE: DEBRIDEMENT OF RIGHT STERNOCLAVICULAR JOINT ABSCESS, APPLICATION OF WOUND VAC   2 white sponges, 1 gray sponge in right sternoclavicular joint wound  SURGEON:  Surgeon(s) and Role:    [name], MD - Primary  PHYSICIAN ASSISTANT: Doree Fudge PA-C  ANESTHESIA:   general  EBL:  300 mL   BLOOD ADMINISTERED:none  DRAINS: None  SPECIMEN:  Source of Specimen:  Right sternoclavicular tissue  DISPOSITION OF SPECIMEN:  Culture, AFB, fungal  COUNTS CORRECT:  YES  DICTATION: .Dragon Dictation  PLAN OF CARE: Admit to inpatient   PATIENT DISPOSITION:  PACU - hemodynamically stable.   Delay start of Pharmacological VTE agent (>24hrs) due to surgical blood loss or risk of bleeding: yes

## 2019-06-20 NOTE — Progress Notes (Signed)
Pre Procedure note for inpatients:   Herbert Walsh has been scheduled for Procedure(s): DEBRIDEMENT OF STERNOCLAVICULAR JOINT ABSCESS (Right) APPLICATION OF WOUND VAC (Right) today. The various methods of treatment have been discussed with the patient. After consideration of the risks, benefits and treatment options the patient has consented to the planned procedure.   The patient has been seen and labs reviewed. There are no changes in the patient's condition to prevent proceeding with the planned procedure today.  Recent labs:  Lab Results  Component Value Date   WBC 16.8 (H) 06/06/2019   HGB 11.6 (L) 06/06/2019   HCT 37.5 (L) 06/06/2019   PLT 557 (H) 06/06/2019   GLUCOSE 141 (H) 06/06/2019   CHOL 189 07/07/2016   TRIG 158 (H) 07/07/2016   HDL 37 (L) 07/07/2016   LDLCALC 120 (H) 07/07/2016   ALT 22 05/31/2019   AST 29 05/31/2019   NA 134 (L) 06/06/2019   K 4.6 06/06/2019   CL 101 06/06/2019   CREATININE 0.66 06/06/2019   BUN 7 06/06/2019   CO2 23 06/06/2019   TSH 1.76 08/06/2018   INR 1.2 06/03/2019   HGBA1C 6.2 (H) 05/31/2019    [name], MD 06/20/2019 7:33 AM

## 2019-06-20 NOTE — Telephone Encounter (Signed)
Noted, thanks!

## 2019-06-20 NOTE — Anesthesia Procedure Notes (Signed)
Procedure Name: Intubation Date/Time: 06/20/2019 8:20 AM Performed by: Nils Pyle, CRNA Pre-anesthesia Checklist: Patient identified, Emergency Drugs available, Suction available and Patient being monitored Patient Re-evaluated:Patient Re-evaluated prior to induction Oxygen Delivery Method: Circle System Utilized Preoxygenation: Pre-oxygenation with 100% oxygen Induction Type: IV induction and Rapid sequence Laryngoscope Size: Miller and 2 Grade View: Grade II Tube type: Oral Tube size: 7.5 mm Number of attempts: 1 Airway Equipment and Method: Stylet and Oral airway Placement Confirmation: ETT inserted through vocal cords under direct vision,  positive ETCO2 and breath sounds checked- equal and bilateral Secured at: 23 cm Tube secured with: Tape Dental Injury: Teeth and Oropharynx as per pre-operative assessment

## 2019-06-21 ENCOUNTER — Inpatient Hospital Stay (HOSPITAL_COMMUNITY): Payer: BC Managed Care – PPO

## 2019-06-21 LAB — BASIC METABOLIC PANEL
Anion gap: 11 (ref 5–15)
BUN: 13 mg/dL (ref 6–20)
CO2: 21 mmol/L — ABNORMAL LOW (ref 22–32)
Calcium: 8.7 mg/dL — ABNORMAL LOW (ref 8.9–10.3)
Chloride: 102 mmol/L (ref 98–111)
Creatinine, Ser: 0.81 mg/dL (ref 0.61–1.24)
GFR calc Af Amer: >60 mL/min (ref >60)
GFR calc non Af Amer: >60 mL/min (ref >60)
Glucose, Bld: 147 mg/dL — ABNORMAL HIGH (ref 70–99)
Potassium: 4.4 mmol/L (ref 3.5–5.1)
Sodium: 134 mmol/L — ABNORMAL LOW (ref 135–145)

## 2019-06-21 LAB — CBC
HCT: 27.9 % — ABNORMAL LOW (ref 39.0–52.0)
Hemoglobin: 8.5 g/dL — ABNORMAL LOW (ref 13.0–17.0)
MCH: 23.9 pg — ABNORMAL LOW (ref 26.0–34.0)
MCHC: 30.5 g/dL (ref 30.0–36.0)
MCV: 78.4 fL — ABNORMAL LOW (ref 80.0–100.0)
Platelets: 522 10*3/uL — ABNORMAL HIGH (ref 150–400)
RBC: 3.56 MIL/uL — ABNORMAL LOW (ref 4.22–5.81)
RDW: 15.7 % — ABNORMAL HIGH (ref 11.5–15.5)
WBC: 13 10*3/uL — ABNORMAL HIGH (ref 4.0–10.5)
nRBC: 0 % (ref 0.0–0.2)

## 2019-06-21 LAB — ACID FAST SMEAR (AFB, MYCOBACTERIA)
Acid Fast Smear: NEGATIVE
Acid Fast Smear: NEGATIVE

## 2019-06-21 IMAGING — DX DG CHEST 1V PORT
1 series · 1 of 1 positions shown · non-contrast
Comparison: Chest radiograph [DATE]; chest CT [DATE]

CLINICAL DATA: Recent right sternoclavicular joint abscess
debridement

EXAM:
PORTABLE CHEST 1 VIEW

[chest ap]
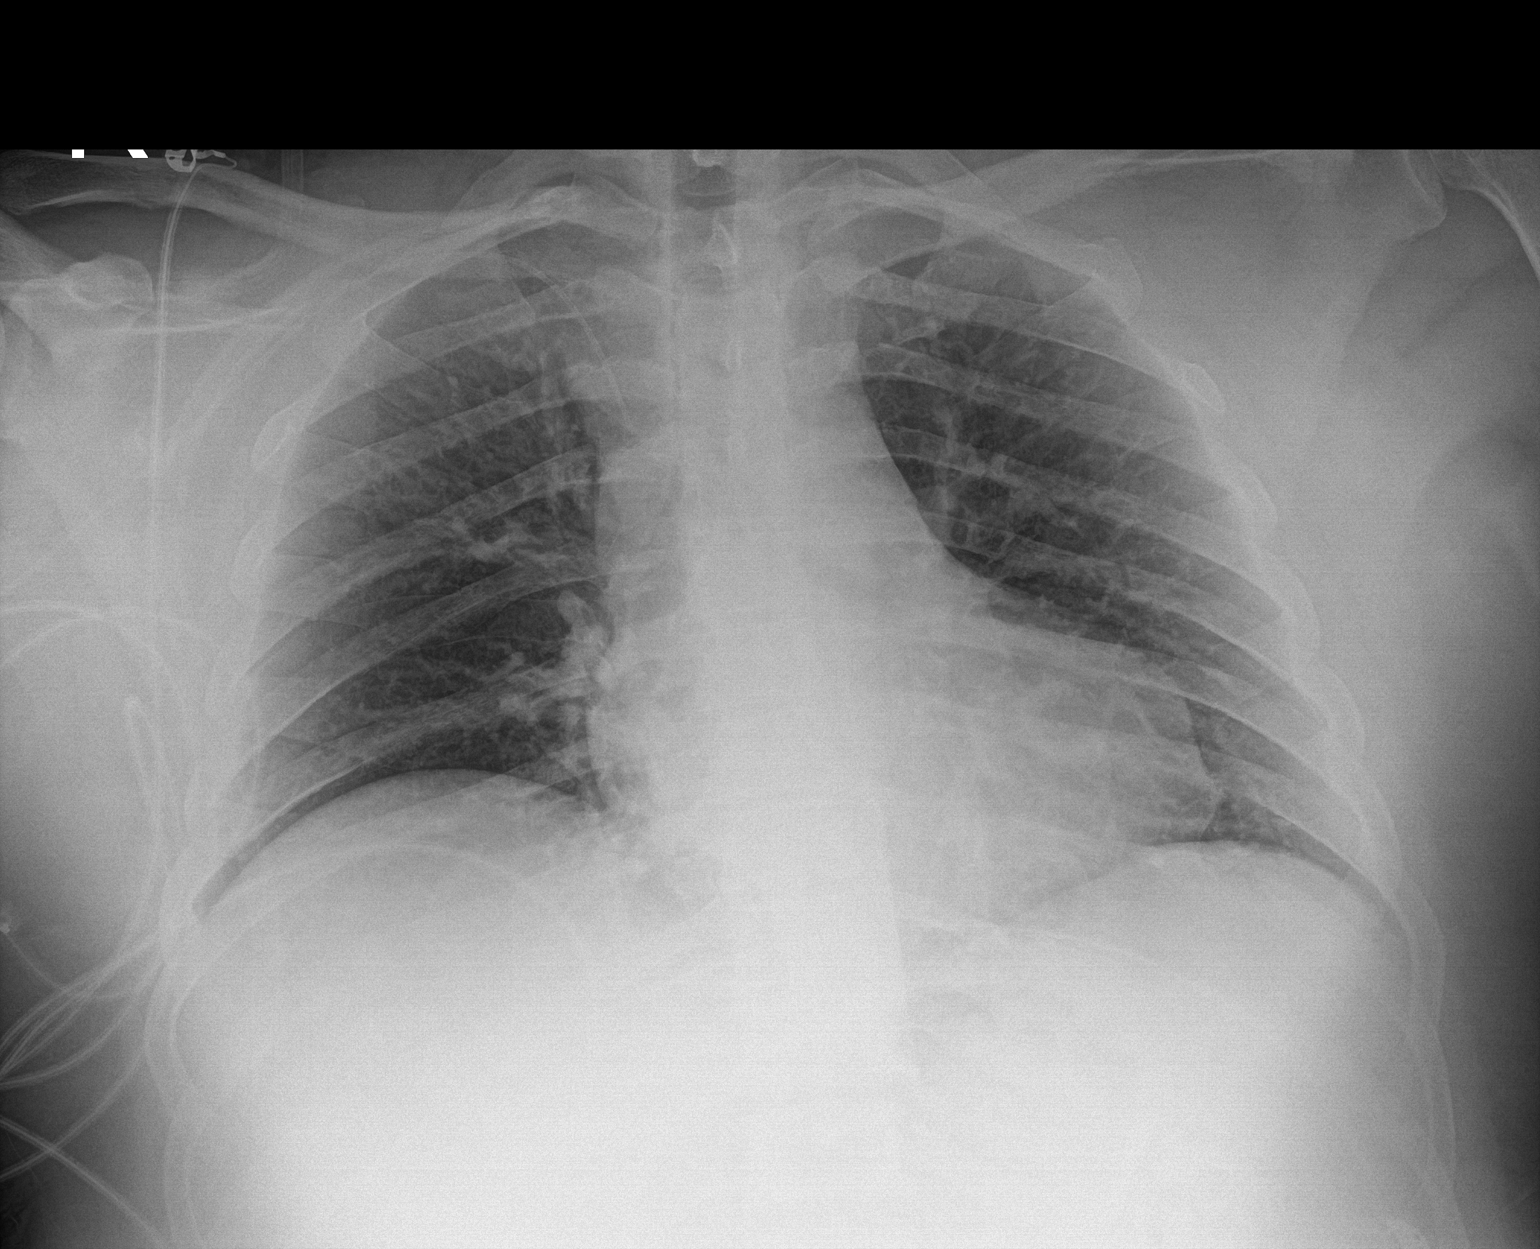

[1 of 1 positions shown; findings below may reference images not displayed]

FINDINGS: Lungs are clear. Heart is upper normal in size with pulmonary
vascularity within normal limits. Central catheter tip is in the
superior vena cava. Endotracheal tube has been removed. No
adenopathy evident. The medial right clavicle appears to have been
removed. Other bony structures appear unremarkable.
IMPRESSION: No edema or airspace opacity. Cardiac silhouette within normal
limits. Central catheter tip in superior vena cava. Apparent absence
of medial right clavicle.

## 2019-06-21 MED ORDER — CHLORHEXIDINE GLUCONATE CLOTH 2 % EX PADS
6.0000 | MEDICATED_PAD | Freq: Every day | CUTANEOUS | Status: DC
  Administered 2019-06-21 – 2019-06-23 (×2): 6 via TOPICAL

## 2019-06-21 NOTE — Progress Notes (Signed)
[address]       Herbert Walsh [address]             [phone]      1 Day Post-Op Procedure(s) (LRB): DEBRIDEMENT OF STERNOCLAVICULAR JOINT ABSCESS (Right) APPLICATION OF WOUND VAC (Right) Subjective: Doing well, pca pump keeping pain controlled  Objective: Vital signs in last 24 hours: Temp:  [97.2 F (36.2 C)-98.3 F (36.8 C)] 97.5 F (36.4 C) (05/08 0742) Pulse Rate:  [88-111] 91 (05/08 0742) Cardiac Rhythm: Normal sinus rhythm (05/08 0812) Resp:  [11-17] 16 (05/08 0742) BP: (93-125)/(60-72) 113/65 (05/08 0742) SpO2:  [97 %-100 %] 97 % (05/08 0742)  Hemodynamic parameters for last 24 hours:    Intake/Output from previous day: 05/07 0701 - 05/08 0700 In: 3728 [P.O.:240; I.V.:2988; IV Piggyback:500] Out: 2450 [Urine:2075; Drains:75; Blood:300] Intake/Output this shift: No intake/output data recorded.  General appearance: alert, cooperative and no distress Heart: regular rate and rhythm and tachy Lungs: clear to auscultation bilaterally Wound: VAC in place, functioning well  Lab Results: Recent Labs    06/20/19 0555 06/21/19 0430  WBC 9.7 13.0*  HGB 8.7* 8.5*  HCT 28.7* 27.9*  PLT 518* 522*   BMET:  Recent Labs    06/20/19 0555 06/21/19 0430  NA 132* 134*  K 4.0 4.4  CL 97* 102  CO2 23 21*  GLUCOSE 162* 147*  BUN 7 13  CREATININE 0.77 0.81  CALCIUM 8.7* 8.7*    PT/INR:  Recent Labs    06/20/19 0555  LABPROT 14.1  INR 1.1   ABG No results found for: PHART, HCO3, TCO2, ACIDBASEDEF, O2SAT CBG (last 3)  No results for input(s): GLUCAP in the last 72 hours.  Meds Scheduled Meds: . acetaminophen  1,000 mg Oral Q6H   Or  . acetaminophen (TYLENOL) oral liquid 160 mg/5 mL  1,000 mg Oral Q6H  . atorvastatin  10 mg Oral QHS  . bisacodyl  10 mg Oral Daily  . Chlorhexidine Gluconate Cloth  6 each Topical Daily  . cloNIDine  0.2 mg Oral TID  . HYDROmorphone   Intravenous Q4H  . ketorolac  15 mg Intravenous Q6H  . lithium  carbonate  750 mg Oral QHS  . multivitamin with minerals  1 tablet Oral Daily  . pantoprazole  40 mg Oral Daily  . risperidone  4 mg Oral QHS  . senna-docusate  1 tablet Oral QHS  . sertraline  50 mg Oral Daily  . traZODone  150 mg Oral QHS   Continuous Infusions: . sodium chloride    . 0.9 % NaCl with KCl 20 mEq / L 100 mL/hr at 06/21/19 0420  .  ceFAZolin (ANCEF) IV 2 g (06/21/19 0923)   PRN Meds:.Place/Maintain arterial line **AND** sodium chloride, diphenhydrAMINE **OR** diphenhydrAMINE, naloxone **AND** sodium chloride flush, ondansetron (ZOFRAN) IV, oxyCODONE, traMADol  Xrays DG Chest 2 View  Result Date: 06/20/2019 CLINICAL DATA:  Preop for debridement of sternoclavicular abscess. EXAM: CHEST - 2 VIEW COMPARISON:  CT of the chest 06/18/2019 FINDINGS: The heart size and mediastinal contours are within normal limits. Both lungs are clear. The visualized skeletal structures are unremarkable. Medial right clavicular erosions are obscured by underlying soft tissues. IMPRESSION: No active cardiopulmonary disease. Electronically Signed   By: Marin Roberts M.D.   On: 06/20/2019 06:38   DG Chest Port 1 View  Result Date: 06/21/2019 CLINICAL DATA:  Recent right sternoclavicular joint abscess debridement EXAM: PORTABLE CHEST 1 VIEW COMPARISON:  Chest radiograph  Jun 20, 2019; chest CT Jun 18, 2019 FINDINGS: Lungs are clear. Heart is upper normal in size with pulmonary vascularity within normal limits. Central catheter tip is in the superior vena cava. Endotracheal tube has been removed. No adenopathy evident. The medial right clavicle appears to have been removed. Other bony structures appear unremarkable. IMPRESSION: No edema or airspace opacity. Cardiac silhouette within normal limits. Central catheter tip in superior vena cava. Apparent absence of medial right clavicle. Electronically Signed   By: Lowella Grip III M.D.   On: 06/21/2019 09:10   DG Chest Portable 1 View  Result Date:  06/20/2019 CLINICAL DATA:  Hypoxia.  Status post thoracic surgery EXAM: PORTABLE CHEST 1 VIEW COMPARISON:  Jun 20, 2019 study obtained earlier in the day FINDINGS: Endotracheal tube tip is 4.9 cm above the carina. Central catheter tip is in the superior vena cava. No pneumothorax. There is interstitial edema. There is consolidation in portions of the right upper lobe and left base. Heart is upper normal in size with pulmonary vascularity normal. No adenopathy. No bone lesions. IMPRESSION: Tube and catheter positions as described without pneumothorax. Interstitial edema. Airspace opacity in the right upper lobe and left base regions noted. These areas of opacity may represent alveolar edema. A degree early pneumonia or possible aspiration are differential considerations for these areas of airspace opacity. Heart upper normal in size. Electronically Signed   By: Lowella Grip III M.D.   On: 06/20/2019 10:15    Assessment/Plan: S/P Procedure(s) (LRB): DEBRIDEMENT OF STERNOCLAVICULAR JOINT ABSCESS (Right) APPLICATION OF WOUND VAC (Right)  1 doing well 2 VSS except tachy at times 3 sats good on 3 liters- wean as able 4 cont PCA for now 5 CXR stable 6 plan for VAC change Monday 7 minor leukocytosis- abx as per ID recs- ancef currently 8 good po intake- d/c maintainance IVF 9 mobilize as able  LOS: 1 day    Constantinos Giovanni PA-C Pager [phone] 06/21/2019

## 2019-06-21 NOTE — Plan of Care (Signed)

## 2019-06-21 NOTE — Op Note (Signed)
NAME: Herbert Walsh, Herbert Walsh MEDICAL RECORD [id number] ACCOUNT [id number] DATE OF BIRTH:11/27/1990 FACILITY: [facility] LOCATION: [facility] PHYSICIAN:Demetre Monaco VAN TRIGT III, MD  OPERATIVE REPORT  DATE OF PROCEDURE:  06/20/2019  OPERATION: 1.  Debridement (excisional) of the right sternoclavicular joint. 2.  Pulse lavage irrigation of the right sternoclavicular joint. 3.  Placement of wound VAC on wound bed of right sternoclavicular joint.  SURGEON:  [name], MD  ASSISTANT:  Doree Fudge, PA-C  ANESTHESIA:  General.  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  DESCRIPTION OF PROCEDURE:  The patient was evaluated in the preoperative holding area where informed consent was documented, his most recent laboratory values were reviewed and final issues were addressed with the patient.  The patient was brought directly back to the operating room and placed supine on the operating table and general anesthesia was induced.  He remained stable.  The chest and upper abdomen were prepped and draped as a sterile field.  A proper time-out was  performed.  An oblique incision was made over the right sternoclavicular joint.  Sharp dissection was carried down to the clavicular head which was surrounded by edematous, chronically infected tissue, which was excised.  There was destroyed bone at the  clavicular head and this was cleared with rongeurs.  Cautery and suture ligation was used to control bleeding.  Next, a liter of vancomycin irrigation was directed as a jet lavage into the wound.  There was an extension of this wound inferiorly towards  the apex of the right lung.  There was an extension in the soft tissue above the clavicle extending laterally, which was also debrided and  irrigated.  After all areas had been completely sharply debrided with excision of infected tissue and the wound was  irrigated, a wound VAC system was placed.  Two small white sponges were placed in the area on top of the right lung apex and beneath the manubrium of the sternum.  Over this, a black sponge was cut to the appropriate size and configuration and filled  the more superficial part of the wound.  Then, the wound VAC sheaths were placed, an opening created and the wound VAC line attached.  Suction was initiated and there was good collapse of the sponge.  Cultures were taken of the tissue as well as the fluid for routine aerobic, anaerobic, AFB and fungal cultures.  The patient was reversed from anesthesia.  A chest x-ray was taken to the operating room which showed no pneumothorax.  The patient was then taken to the recovery room.  He was in stable condition.  VN/NUANCE  D:06/20/2019 T:06/21/2019 JOB:[id number]

## 2019-06-21 NOTE — Progress Notes (Signed)
Patient ID: Herbert Walsh, male   DOB: [birth year], 29 y.o.   MRN: [id number] TCTS DAILY ICU PROGRESS NOTE                   [address]            [place] [address]          [phone]   1 Day Post-Op Procedure(s) (LRB): DEBRIDEMENT OF STERNOCLAVICULAR JOINT ABSCESS (Right) APPLICATION OF WOUND VAC (Right)  Total Length of Stay:  LOS: 1 day   Subjective: Complaint of pain at the right supraclavicular incision site, wound VAC is intact and working well  Objective: Vital signs in last 24 hours: Temp:  [97.2 F (36.2 C)-98.3 F (36.8 C)] 97.5 F (36.4 C) (05/08 0742) Pulse Rate:  [88-111] 91 (05/08 0742) Cardiac Rhythm: Normal sinus rhythm (05/08 0812) Resp:  [11-17] 16 (05/08 0742) BP: (93-125)/(60-72) 113/65 (05/08 0742) SpO2:  [97 %-100 %] 97 % (05/08 0742)  There were no vitals filed for this visit.  Weight change:    Hemodynamic parameters for last 24 hours:    Intake/Output from previous day: 05/07 0701 - 05/08 0700 In: 3728 [P.O.:240; I.V.:2988; IV Piggyback:500] Out: 2450 [Urine:2075; Drains:75; Blood:300]  Intake/Output this shift: No intake/output data recorded.  Current Meds: Scheduled Meds: . acetaminophen  1,000 mg Oral Q6H   Or  . acetaminophen (TYLENOL) oral liquid 160 mg/5 mL  1,000 mg Oral Q6H  . atorvastatin  10 mg Oral QHS  . bisacodyl  10 mg Oral Daily  . Chlorhexidine Gluconate Cloth  6 each Topical Daily  . cloNIDine  0.2 mg Oral TID  . HYDROmorphone   Intravenous Q4H  . ketorolac  15 mg Intravenous Q6H  . lithium carbonate  750 mg Oral QHS  . multivitamin with minerals  1 tablet Oral Daily  . pantoprazole  40 mg Oral Daily  . risperidone  4 mg Oral QHS  . senna-docusate  1 tablet Oral QHS  . sertraline  50 mg Oral Daily  . traZODone  150 mg Oral QHS   Continuous Infusions: . sodium chloride    . 0.9 % NaCl with KCl 20 mEq / L 100 mL/hr at 06/21/19 0420  .  ceFAZolin (ANCEF) IV 2 g (06/21/19 0923)   PRN  Meds:.Place/Maintain arterial line **AND** sodium chloride, diphenhydrAMINE **OR** diphenhydrAMINE, naloxone **AND** sodium chloride flush, ondansetron (ZOFRAN) IV, oxyCODONE, traMADol  General appearance: alert, cooperative and no distress Neurologic: intact Heart: regular rate and rhythm, S1, S2 normal, no murmur, click, rub or gallop Lungs: clear to auscultation bilaterally Extremities: extremities normal, atraumatic, no cyanosis or edema Wound: Incision right supraclavicular area stable wound VAC in place and functioning  Lab Results: CBC: Recent Labs    06/20/19 0555 06/21/19 0430  WBC 9.7 13.0*  HGB 8.7* 8.5*  HCT 28.7* 27.9*  PLT 518* 522*   BMET:  Recent Labs    06/20/19 0555 06/21/19 0430  NA 132* 134*  K 4.0 4.4  CL 97* 102  CO2 23 21*  GLUCOSE 162* 147*  BUN 7 13  CREATININE 0.77 0.81  CALCIUM 8.7* 8.7*    CMET: Lab Results  Component Value Date   WBC 13.0 (H) 06/21/2019   HGB 8.5 (L) 06/21/2019   HCT 27.9 (L) 06/21/2019   PLT 522 (H) 06/21/2019   GLUCOSE 147 (H) 06/21/2019   CHOL 189 07/07/2016   TRIG 158 (H) 07/07/2016   HDL 37 (L) 07/07/2016   LDLCALC 120 (  H) 07/07/2016   ALT 26 06/20/2019   AST 23 06/20/2019   NA 134 (L) 06/21/2019   K 4.4 06/21/2019   CL 102 06/21/2019   CREATININE 0.81 06/21/2019   BUN 13 06/21/2019   CO2 21 (L) 06/21/2019   TSH 1.76 08/06/2018   INR 1.1 06/20/2019   HGBA1C 6.2 (H) 05/31/2019      PT/INR:  Recent Labs    06/20/19 0555  LABPROT 14.1  INR 1.1   Radiology: [facility] Chest Port 1 View  Result Date: 06/21/2019 CLINICAL DATA:  Recent right sternoclavicular joint abscess debridement EXAM: PORTABLE CHEST 1 VIEW COMPARISON:  Chest radiograph Jun 20, 2019; chest CT Jun 18, 2019 FINDINGS: Lungs are clear. Heart is upper normal in size with pulmonary vascularity within normal limits. Central catheter tip is in the superior vena cava. Endotracheal tube has been removed. No adenopathy evident. The medial right clavicle  appears to have been removed. Other bony structures appear unremarkable. IMPRESSION: No edema or airspace opacity. Cardiac silhouette within normal limits. Central catheter tip in superior vena cava. Apparent absence of medial right clavicle. Electronically Signed   By: Bretta Bang III M.D.   On: 06/21/2019 09:10   Recent Results (from the past 240 hour(s))  SARS CORONAVIRUS 2 (TAT 6-24 HRS) Nasopharyngeal Nasopharyngeal Swab     Status: None   Collection Time: 06/18/19 11:47 AM   Specimen: Nasopharyngeal Swab  Result Value Ref Range Status   SARS Coronavirus 2 NEGATIVE NEGATIVE Final    Comment: (NOTE) SARS-CoV-2 target nucleic acids are NOT DETECTED. The SARS-CoV-2 RNA is generally detectable in upper and lower respiratory specimens during the acute phase of infection. Negative results do not preclude SARS-CoV-2 infection, do not rule out co-infections with other pathogens, and should not be used as the sole basis for treatment or other patient management decisions. Negative results must be combined with clinical observations, patient history, and epidemiological information. The expected result is Negative. Fact Sheet for Patients: HairSlick.no Fact Sheet for Healthcare Providers: quierodirigir.com This test is not yet approved or cleared by the [place] FDA and  has been authorized for detection and/or diagnosis of SARS-CoV-2 by FDA under an Emergency Use Authorization (EUA). This EUA will remain  in effect (meaning this test can be used) for the duration of the COVID-19 declaration under Section 56 4(b)(1) of the Act, 21 U.S.C. section 360bbb-3(b)(1), unless the authorization is terminated or revoked sooner. Performed at [facility], [address] [place], [place] [address]   Aerobic/Anaerobic Culture (surgical/deep wound)     Status: None (Preliminary result)   Collection Time: 06/20/19  8:59 AM    Specimen: Wound  Result Value Ref Range Status   Specimen Description WOUND  Final   Special Requests RIGHT SUBCLAVICULAR WOUND SPEC A  Final   Gram Stain   Final    ABUNDANT WBC PRESENT,BOTH PMN AND MONONUCLEAR NO ORGANISMS SEEN Performed at [facility], [address] [place], [place] [address]    Culture RARE STAPHYLOCOCCUS AUREUS  Final   Report Status PENDING  Incomplete  Aerobic/Anaerobic Culture (surgical/deep wound)     Status: None (Preliminary result)   Collection Time: 06/20/19  9:04 AM   Specimen: Wound; Tissue  Result Value Ref Range Status   Specimen Description TISSUE  Final   Special Requests RIGHT CLAVICULAR JOINT TISSUE SPEC B  Final   Gram Stain   Final    FEW WBC PRESENT,BOTH PMN AND MONONUCLEAR RARE GRAM POSITIVE COCCI  Performed at [facility], [address], [place], [place] [address]    Culture FEW STAPHYLOCOCCUS AUREUS  Final   Report Status PENDING  Incomplete   Assessment/Plan: S/P Procedure(s) (LRB): DEBRIDEMENT OF STERNOCLAVICULAR JOINT ABSCESS (Right) APPLICATION OF WOUND VAC (Right) Mobilize Continue with wound VAC in place Decrease IV rate Full cultures pending-few staph aureus noted currently on ancef   Delight Ovens 06/21/2019 11:31 AM

## 2019-06-21 NOTE — Progress Notes (Signed)
Patient mews fired yellow because of his respirations and pulse rate. Patient was lying in bed. Taken at resting state however patient just got done using his urinal. Patient pulse recovered. Rechecked at 93bpm. Resp 13 at this time. No distress. Alert and able to make needs known. Denies needs at this time.

## 2019-06-21 NOTE — Evaluation (Signed)
Physical Therapy Evaluation/ Discharge Patient Details Name: Herbert Walsh MRN: [id number] DOB: 1991-01-21 Today's Date: 06/21/2019   History of Present Illness  Pt is a 29 y.o. male admitted 06/20/19 with progressive MSSA infection of Rt [place] joint s/p I&D. Pt with recent admission 05/30/19 with MSSA bacteremia with Rt knee septic arthritis and R  joint septic effusion/sternocleidomastoid muscle abscess. PMHx: bipolar,schizophrenia, HTN, arthritis, substance abuse  Clinical Impression  Pt supine on arrival and reports frustration with not being able to sleep acutely. Pt reports 5/10 pain in Right knee with activity and states no assist needed at home for functional mobility or self care. Pt lives with mom and reports she can assist as needed. Pt also reports he has HEP from last admission but does not follow it. Pt able to state inability to squat due to limited knee ROM and educated for need for progressive knee flexion to maximize function and limit scar tissue with pt voicing understanding. Pt able to demonstrate basic mobility, gait and HEp with denial for further acute services. Pt encouraged to walk and maintain HEP. Pt voiced understanding and agreement to no further acute therapy services, will sign off.     Follow Up Recommendations No PT follow up    Equipment Recommendations  None recommended by PT    Recommendations for Other Services       Precautions / Restrictions Precautions Precautions: None Restrictions RLE Weight Bearing: Weight bearing as tolerated      Mobility  Bed Mobility Overal bed mobility: Independent                Transfers Overall transfer level: Modified independent                  Ambulation/Gait Ambulation/Gait assistance: Supervision Gait Distance (Feet): 300 Feet Assistive device: None Gait Pattern/deviations: Step-through pattern;Decreased stride length;Wide base of support;Antalgic   Gait velocity interpretation: >4.37 ft/sec,  indicative of normal walking speed General Gait Details: pt with decreased stance on RLE but denied need for AD. Pt with relatively quick gait and no LOB  Stairs            Wheelchair Mobility    Modified Rankin (Stroke Patients Only)       Balance Overall balance assessment: Mild deficits observed, not formally tested                                           Pertinent Vitals/Pain Pain Assessment: 0-10 Pain Score: 5  Pain Location: R knee Pain Descriptors / Indicators: Guarding;Sore Pain Intervention(s): Limited activity within patient's tolerance;Premedicated before session;Repositioned;Monitored during session    [place] expects to be discharged to:: Private residence Living Arrangements: Parent Available Help at Discharge: Family;Available 24 hours/day Type of Home: Mobile home Home Access: Stairs to enter Entrance Stairs-Rails: Right;Left;Can reach both Entrance Stairs-Number of Steps: 4 Home Layout: One level Home Equipment: Walker - 2 wheels      Prior Function Level of Independence: Independent         Comments: pt reports he uses RW on occasion but is caring for himself     Hand Dominance        Extremity/Trunk Assessment   Upper Extremity Assessment RUE Deficits / Details: pt performing mobility for functional tasks and denied rOM deficits    Lower Extremity Assessment Lower Extremity Assessment: RLE deficits/detail RLE Deficits / Details: knee  ROM grossly 0-95 degrees with pt able to perform active SLR and heel slides.    Cervical / Trunk Assessment Cervical / Trunk Assessment: Normal  Communication   Communication: No difficulties  Cognition Arousal/Alertness: Awake/alert Behavior During Therapy: Flat affect Overall Cognitive Status: Within Functional Limits for tasks assessed                                 General Comments: pt with short temper and frustrated with being asked to  move. Pt impulsive with cues to attend to lines      General Comments      Exercises General Exercises - Lower Extremity Heel Slides: AROM;Right;Supine;10 reps Straight Leg Raises: AROM;Right;Supine;10 reps   Assessment/Plan    PT Assessment Patent does not need any further PT services  PT Problem List         PT Treatment Interventions      PT Goals (Current goals can be found in the Care Plan section)  Acute Rehab PT Goals PT Goal Formulation: All assessment and education complete, DC therapy    Frequency     Barriers to discharge        Co-evaluation               AM-PAC PT "6 Clicks" Mobility  Outcome Measure Help needed turning from your back to your side while in a flat bed without using bedrails?: None Help needed moving from lying on your back to sitting on the side of a flat bed without using bedrails?: None Help needed moving to and from a bed to a chair (including a wheelchair)?: None Help needed standing up from a chair using your arms (e.g., wheelchair or bedside chair)?: None Help needed to walk in hospital room?: None Help needed climbing 3-5 steps with a railing? : None 6 Click Score: 24    End of Session   Activity Tolerance: Patient tolerated treatment well Patient left: in bed;with call bell/phone within reach Nurse Communication: Mobility status PT Visit Diagnosis: Other abnormalities of gait and mobility (R26.89);Pain    Time: 5397-6734 PT Time Calculation (min) (ACUTE ONLY): 19 min   Charges:   PT Evaluation $PT Eval Moderate Complexity: 1 Mod          Altie Savard P, PT Acute Rehabilitation Services Pager: [phone] Office: [phone]   Judene Logue B Pearson Picou 06/21/2019, 1:27 PM

## 2019-06-22 LAB — URINALYSIS, ROUTINE W REFLEX MICROSCOPIC
Bilirubin Urine: NEGATIVE
Glucose, UA: NEGATIVE mg/dL
Hgb urine dipstick: NEGATIVE
Ketones, ur: NEGATIVE mg/dL
Leukocytes,Ua: NEGATIVE
Nitrite: NEGATIVE
Protein, ur: NEGATIVE mg/dL
Specific Gravity, Urine: 1.012 (ref 1.005–1.030)
pH: 6 (ref 5.0–8.0)

## 2019-06-22 LAB — CBC
HCT: 27.1 % — ABNORMAL LOW (ref 39.0–52.0)
Hemoglobin: 8.2 g/dL — ABNORMAL LOW (ref 13.0–17.0)
MCH: 24.6 pg — ABNORMAL LOW (ref 26.0–34.0)
MCHC: 30.3 g/dL (ref 30.0–36.0)
MCV: 81.1 fL (ref 80.0–100.0)
Platelets: 495 10*3/uL — ABNORMAL HIGH (ref 150–400)
RBC: 3.34 MIL/uL — ABNORMAL LOW (ref 4.22–5.81)
RDW: 16.4 % — ABNORMAL HIGH (ref 11.5–15.5)
WBC: 8.8 10*3/uL (ref 4.0–10.5)
nRBC: 0 % (ref 0.0–0.2)

## 2019-06-22 LAB — BASIC METABOLIC PANEL
Anion gap: 8 (ref 5–15)
BUN: 15 mg/dL (ref 6–20)
CO2: 25 mmol/L (ref 22–32)
Calcium: 8.8 mg/dL — ABNORMAL LOW (ref 8.9–10.3)
Chloride: 106 mmol/L (ref 98–111)
Creatinine, Ser: 0.74 mg/dL (ref 0.61–1.24)
GFR calc Af Amer: >60 mL/min (ref >60)
GFR calc non Af Amer: >60 mL/min (ref >60)
Glucose, Bld: 117 mg/dL — ABNORMAL HIGH (ref 70–99)
Potassium: 4.8 mmol/L (ref 3.5–5.1)
Sodium: 139 mmol/L (ref 135–145)

## 2019-06-22 MED ORDER — SODIUM CHLORIDE 0.9% FLUSH: 10.0000 mL | INTRAVENOUS | Status: DC | PRN

## 2019-06-22 MED ORDER — SODIUM CHLORIDE 0.9% FLUSH
10.0000 mL | Freq: Two times a day (BID) | INTRAVENOUS | Status: DC
  Administered 2019-06-22 – 2019-06-24 (×4): 10 mL

## 2019-06-22 MED ORDER — ALTEPLASE 2 MG IJ SOLR
2.0000 mg | Freq: Once | INTRAMUSCULAR | Status: AC
  Administered 2019-06-22: 2 mg
  Filled 2019-06-22: qty 2

## 2019-06-22 NOTE — Progress Notes (Signed)
Patient ID: Herbert Walsh, male   DOB: 07/26/1990, 29 y.o.   MRN: [id number]          [facility]    Date of Admission:  06/20/2019   Total days of antibiotics 22         Operative specimens from Mr. Laswell right sternoclavicular joint have grown MSSA again.  I will extend IV cefazolin at least 4 weeks postoperatively.  I will sign off now.  Diagnosis: Septic arthritis and osteomyelitis  Culture Result: MSSA  No Known Allergies  OPAT Orders Discharge antibiotics to be given via PICC line Discharge antibiotics: Per pharmacy protocol cefazolin  Duration: 4 weeks End Date: 07/19/2019  [facility] Care Per Protocol:  Home health RN for IV administration and teaching; PICC line care and labs.    Labs weekly while on IV antibiotics: _x_ CBC with differential _x_ BMP __ CMP _x_ CRP _x_ ESR __ Vancomycin trough __ CK  _x_ Please pull PIC at completion of IV antibiotics __ Please leave PIC in place until doctor has seen patient or been notified  Fax weekly labs to [phone]  Clinic Follow Up Appt: 07/07/2019        Michel Bickers, [place] for [place] 336 [phone] pager   [phone] cell 06/22/2019, 2:33 PM

## 2019-06-22 NOTE — Plan of Care (Signed)

## 2019-06-22 NOTE — Plan of Care (Signed)
  Problem: Education: Goal: Knowledge of General Education information will improve Description Including pain rating scale, medication(s)/side effects and non-pharmacologic comfort measures Outcome: Progressing   Problem: Clinical Measurements: Goal: Will remain free from infection Outcome: Progressing Goal: Respiratory complications will improve Outcome: Progressing Goal: Cardiovascular complication will be avoided Outcome: Progressing   Problem: Nutrition: Goal: Adequate nutrition will be maintained Outcome: Progressing   

## 2019-06-22 NOTE — Progress Notes (Signed)
PHARMACY CONSULT NOTE FOR:  OUTPATIENT  PARENTERAL ANTIBIOTIC THERAPY (OPAT)  Indication: septic arthritis Regimen: cefazolin 2g IV q8h End date: 07/19/19  IV antibiotic discharge orders are pended. To discharging provider:  please sign these orders via discharge navigator,  Select New Orders & click on the button choice - Manage This Unsigned Work.     Thank you for allowing pharmacy to be a part of this patient's care.  Mosetta Anis 06/22/2019, 2:45 PM

## 2019-06-22 NOTE — Progress Notes (Addendum)
[address]       [place] [address]             [phone]      2 Days Post-Op Procedure(s) (LRB): DEBRIDEMENT OF STERNOCLAVICULAR JOINT ABSCESS (Right) APPLICATION OF WOUND VAC (Right) Subjective: Feels ok, some pain  Objective: Vital signs in last 24 hours: Temp:  [97.5 F (36.4 C)-97.8 F (36.6 C)] 97.6 F (36.4 C) (05/09 0858) Pulse Rate:  [86-111] 111 (05/09 1100) Cardiac Rhythm: Supraventricular tachycardia (05/09 0945) Resp:  [12-19] 12 (05/09 1100) BP: (95-108)/(51-65) 102/64 (05/09 0858) SpO2:  [90 %-100 %] 96 % (05/09 1100) Weight:  [122.5 kg] 122.5 kg (05/08 1949)  Hemodynamic parameters for last 24 hours:    Intake/Output from previous day: 05/08 0701 - 05/09 0700 In: 720 [P.O.:720] Out: 1200 [Urine:1200] Intake/Output this shift: Total I/O In: 370 [P.O.:360; I.V.:10] Out: -   General appearance: alert, cooperative and no distress Heart: regular rate and rhythm Lungs: clear to auscultation bilaterally Abdomen: benign, obese Extremities: no edema or calf tenderness Wound: VAC in place, no purulence or cellulitis appreciated  Lab Results: Recent Labs    06/21/19 0430 06/22/19 1016  WBC 13.0* 8.8  HGB 8.5* 8.2*  HCT 27.9* 27.1*  PLT 522* 495*   BMET:  Recent Labs    06/21/19 0430 06/22/19 0524  NA 134* 139  K 4.4 4.8  CL 102 106  CO2 21* 25  GLUCOSE 147* 117*  BUN 13 15  CREATININE 0.81 0.74  CALCIUM 8.7* 8.8*    PT/INR:  Recent Labs    06/20/19 0555  LABPROT 14.1  INR 1.1   ABG No results found for: PHART, HCO3, TCO2, ACIDBASEDEF, O2SAT CBG (last 3)  No results for input(s): GLUCAP in the last 72 hours.  Meds Scheduled Meds: . acetaminophen  1,000 mg Oral Q6H   Or  . acetaminophen (TYLENOL) oral liquid 160 mg/5 mL  1,000 mg Oral Q6H  . atorvastatin  10 mg Oral QHS  . bisacodyl  10 mg Oral Daily  . Chlorhexidine Gluconate Cloth  6 each Topical Daily  . cloNIDine  0.2 mg Oral TID  .  HYDROmorphone   Intravenous Q4H  . lithium carbonate  750 mg Oral QHS  . multivitamin with minerals  1 tablet Oral Daily  . pantoprazole  40 mg Oral Daily  . risperidone  4 mg Oral QHS  . senna-docusate  1 tablet Oral QHS  . sertraline  50 mg Oral Daily  . sodium chloride flush  10-40 mL Intracatheter Q12H  . traZODone  150 mg Oral QHS   Continuous Infusions: . sodium chloride    .  ceFAZolin (ANCEF) IV 2 g (06/22/19 0852)   PRN Meds:.Place/Maintain arterial line **AND** sodium chloride, diphenhydrAMINE **OR** diphenhydrAMINE, naloxone **AND** sodium chloride flush, ondansetron (ZOFRAN) IV, oxyCODONE, sodium chloride flush, traMADol  Xrays DG Chest Port 1 View  Result Date: 06/21/2019 CLINICAL DATA:  Recent right sternoclavicular joint abscess debridement EXAM: PORTABLE CHEST 1 VIEW COMPARISON:  Chest radiograph Jun 20, 2019; chest CT Jun 18, 2019 FINDINGS: Lungs are clear. Heart is upper normal in size with pulmonary vascularity within normal limits. Central catheter tip is in the superior vena cava. Endotracheal tube has been removed. No adenopathy evident. The medial right clavicle appears to have been removed. Other bony structures appear unremarkable. IMPRESSION: No edema or airspace opacity. Cardiac silhouette within normal limits. Central catheter tip in superior vena cava. Apparent absence of medial right  clavicle. Electronically Signed   By: Bretta Bang III M.D.   On: 06/21/2019 09:10   Results for orders placed or performed during the hospital encounter of 06/20/19  Aerobic/Anaerobic Culture (surgical/deep wound)     Status: None (Preliminary result)   Collection Time: 06/20/19  8:59 AM   Specimen: Wound  Result Value Ref Range Status   Specimen Description WOUND  Final   Special Requests RIGHT SUBCLAVICULAR WOUND SPEC A  Final   Gram Stain   Final    ABUNDANT WBC PRESENT,BOTH PMN AND MONONUCLEAR NO ORGANISMS SEEN    Culture RARE STAPHYLOCOCCUS AUREUS  Final   Report  Status PENDING  Incomplete   Organism ID, Bacteria STAPHYLOCOCCUS AUREUS  Final      Susceptibility   Staphylococcus aureus - MIC*    CIPROFLOXACIN <=0.5 SENSITIVE Sensitive     ERYTHROMYCIN RESISTANT Resistant     GENTAMICIN <=0.5 SENSITIVE Sensitive     OXACILLIN 0.5 SENSITIVE Sensitive     TETRACYCLINE <=1 SENSITIVE Sensitive     VANCOMYCIN <=0.5 SENSITIVE Sensitive     TRIMETH/SULFA <=10 SENSITIVE Sensitive     CLINDAMYCIN RESISTANT Resistant     RIFAMPIN <=0.5 SENSITIVE Sensitive     Inducible Clindamycin Value in next row Resistant      POSITIVEPerformed at [facility], [address], [place], [place] [address]    * RARE STAPHYLOCOCCUS AUREUS  Acid Fast Smear (AFB)     Status: None   Collection Time: 06/20/19  8:59 AM   Specimen: Wound  Result Value Ref Range Status   AFB Specimen Processing Concentration  Final   Acid Fast Smear Negative  Final    Comment: (NOTE) Performed At: [facility] [address] [place], [place] [id number] Jolene Schimke MD QB:[id number]    Source (AFB) WOUND  Final    Comment: RIGHT SUBCLAVICULAR WOUD SPEC A Performed at [facility], [address] [place], [place] [address]   Aerobic/Anaerobic Culture (surgical/deep wound)     Status: None (Preliminary result)   Collection Time: 06/20/19  9:04 AM   Specimen: Wound; Tissue  Result Value Ref Range Status   Specimen Description TISSUE  Final   Special Requests RIGHT CLAVICULAR JOINT TISSUE SPEC B  Final   Gram Stain   Final    FEW WBC PRESENT,BOTH PMN AND MONONUCLEAR RARE GRAM POSITIVE COCCI    Culture FEW STAPHYLOCOCCUS AUREUS  Final   Report Status PENDING  Incomplete   Organism ID, Bacteria STAPHYLOCOCCUS AUREUS  Final      Susceptibility   Staphylococcus aureus - MIC*    CIPROFLOXACIN <=0.5 SENSITIVE Sensitive     ERYTHROMYCIN RESISTANT Resistant     GENTAMICIN <=0.5 SENSITIVE Sensitive     OXACILLIN 0.5 SENSITIVE Sensitive     TETRACYCLINE <=1 SENSITIVE  Sensitive     VANCOMYCIN 1 SENSITIVE Sensitive     TRIMETH/SULFA <=10 SENSITIVE Sensitive     CLINDAMYCIN RESISTANT Resistant     RIFAMPIN <=0.5 SENSITIVE Sensitive     Inducible Clindamycin Value in next row Resistant      POSITIVEPerformed at [facility], [address] [place], [place] [address]    * FEW STAPHYLOCOCCUS AUREUS  Acid Fast Smear (AFB)     Status: None   Collection Time: 06/20/19  9:04 AM   Specimen: Wound; Tissue  Result Value Ref Range Status   AFB Specimen Processing Concentration  Final   Acid Fast Smear Negative  Final  Comment: (NOTE) Performed At: [facility] [place], [place] [id number] Rush Farmer MD KJ:[id number]    Source (AFB) TISSUE  Final    Comment: RIGHT CLAVICULAR JOINT TISSUE Performed at [facility], [address], [place], [place] [address]    Assessment/Plan: S/P Procedure(s) (LRB): DEBRIDEMENT OF STERNOCLAVICULAR JOINT ABSCESS (Right) APPLICATION OF WOUND VAC (Right)  1 stable 2 tachy at times, probably pain associated 3 sats good on RA 4 leukocytosis resolved, no fevers- cont current abx, ID assisting, + staph aureus 5 VAC change tomorrow  LOS: 2 days    Ezana Giovanni PA-C Pager [phone] 06/22/2019  I have seen and examined Herbert Walsh and agree with the above assessment  and plan.  Grace Isaac MD Beeper [phone] Office [phone] 06/22/2019 12:59 PM

## 2019-06-22 NOTE — Progress Notes (Signed)
Eldred Manges, RN wasted 6 ml of HYDROmorphone (DILAUDID) 1mg /mL PCA injection in stericycle with , RN.

## 2019-06-22 NOTE — Progress Notes (Signed)
Patient refused CHG bath in the morning and recommended again in the afternoon CHG bath and the reason to do. Patient knew why it is important, but he didn't want to do today because in the morning he was upset with PICC line was occluded and lab called three times draw the blood for CBC. He is cursing most of time asking questions but he answered questions. Patient understood it well he need to be NPO after MN due to going to OR tomorrow. At times his HR went up 100's due to pain. Pain control with PCA pump and oral oxy. HS [employer]

## 2019-06-23 ENCOUNTER — Inpatient Hospital Stay (HOSPITAL_COMMUNITY): Payer: BC Managed Care – PPO | Admitting: Anesthesiology

## 2019-06-23 ENCOUNTER — Encounter (HOSPITAL_COMMUNITY)
Admission: RE | Disposition: A | Discharge status: Home Health | Payer: Self-pay | Source: Ambulatory Visit | Attending: Cardiothoracic Surgery

## 2019-06-23 ENCOUNTER — Encounter (HOSPITAL_COMMUNITY): Payer: Self-pay | Admitting: Cardiothoracic Surgery

## 2019-06-23 HISTORY — PX: WOUND EXPLORATION: SHX6188

## 2019-06-23 HISTORY — PX: APPLICATION OF WOUND VAC: SHX5189

## 2019-06-23 LAB — BASIC METABOLIC PANEL
Anion gap: 9 (ref 5–15)
BUN: 12 mg/dL (ref 6–20)
CO2: 28 mmol/L (ref 22–32)
Calcium: 9 mg/dL (ref 8.9–10.3)
Chloride: 103 mmol/L (ref 98–111)
Creatinine, Ser: 0.82 mg/dL (ref 0.61–1.24)
GFR calc Af Amer: >60 mL/min (ref >60)
GFR calc non Af Amer: >60 mL/min (ref >60)
Glucose, Bld: 103 mg/dL — ABNORMAL HIGH (ref 70–99)
Potassium: 4.1 mmol/L (ref 3.5–5.1)
Sodium: 140 mmol/L (ref 135–145)

## 2019-06-23 LAB — CBC
HCT: 28.6 % — ABNORMAL LOW (ref 39.0–52.0)
Hemoglobin: 8.4 g/dL — ABNORMAL LOW (ref 13.0–17.0)
MCH: 23.7 pg — ABNORMAL LOW (ref 26.0–34.0)
MCHC: 29.4 g/dL — ABNORMAL LOW (ref 30.0–36.0)
MCV: 80.6 fL (ref 80.0–100.0)
Platelets: 483 10*3/uL — ABNORMAL HIGH (ref 150–400)
RBC: 3.55 MIL/uL — ABNORMAL LOW (ref 4.22–5.81)
RDW: 16.5 % — ABNORMAL HIGH (ref 11.5–15.5)
WBC: 7.6 10*3/uL (ref 4.0–10.5)
nRBC: 0.7 % — ABNORMAL HIGH (ref 0.0–0.2)

## 2019-06-23 SURGERY — WOUND EXPLORATION
Anesthesia: General | Site: Chest | Laterality: Right

## 2019-06-23 MED ORDER — LIDOCAINE 2% (20 MG/ML) 5 ML SYRINGE
INTRAMUSCULAR | Status: AC
  Filled 2019-06-23: qty 5

## 2019-06-23 MED ORDER — ACETAMINOPHEN 325 MG PO TABS: 325.0000 mg | ORAL_TABLET | Freq: Once | ORAL | Status: DC | PRN

## 2019-06-23 MED ORDER — EPHEDRINE 5 MG/ML INJ
INTRAVENOUS | Status: AC
  Filled 2019-06-23: qty 10

## 2019-06-23 MED ORDER — VANCOMYCIN HCL 1000 MG IV SOLR
INTRAVENOUS | Status: AC
  Filled 2019-06-23: qty 1000

## 2019-06-23 MED ORDER — DEXAMETHASONE SODIUM PHOSPHATE 10 MG/ML IJ SOLN
INTRAMUSCULAR | Status: DC | PRN
Start: 2019-06-23 — End: 2019-06-23
  Administered 2019-06-23: 10 mg via INTRAVENOUS

## 2019-06-23 MED ORDER — PHENYLEPHRINE HCL-NACL 10-0.9 MG/250ML-% IV SOLN
INTRAVENOUS | Status: DC | PRN
Start: 2019-06-23 — End: 2019-06-23
  Administered 2019-06-23: 25 ug/min via INTRAVENOUS

## 2019-06-23 MED ORDER — VANCOMYCIN HCL 1000 MG IV SOLR
INTRAVENOUS | Status: DC | PRN
  Administered 2019-06-23: 1000 mg

## 2019-06-23 MED ORDER — HYDROMORPHONE HCL 1 MG/ML IJ SOLN: 1.0000 mg | INTRAMUSCULAR | Status: DC | PRN

## 2019-06-23 MED ORDER — LACTATED RINGERS IV SOLN: INTRAVENOUS | Status: DC | PRN

## 2019-06-23 MED ORDER — NALOXONE HCL 0.4 MG/ML IJ SOLN
INTRAMUSCULAR | Status: AC
  Filled 2019-06-23: qty 1

## 2019-06-23 MED ORDER — PROPOFOL 10 MG/ML IV BOLUS
INTRAVENOUS | Status: AC
  Filled 2019-06-23: qty 20

## 2019-06-23 MED ORDER — ROCURONIUM BROMIDE 10 MG/ML (PF) SYRINGE
PREFILLED_SYRINGE | INTRAVENOUS | Status: DC | PRN
  Administered 2019-06-23: 60 mg via INTRAVENOUS

## 2019-06-23 MED ORDER — MIDAZOLAM HCL 5 MG/5ML IJ SOLN
INTRAMUSCULAR | Status: DC | PRN
  Administered 2019-06-23: 2 mg via INTRAVENOUS

## 2019-06-23 MED ORDER — FENTANYL CITRATE (PF) 100 MCG/2ML IJ SOLN
INTRAMUSCULAR | Status: DC | PRN
  Administered 2019-06-23: 100 ug via INTRAVENOUS
  Administered 2019-06-23: 50 ug via INTRAVENOUS

## 2019-06-23 MED ORDER — MEPERIDINE HCL 25 MG/ML IJ SOLN: 6.2500 mg | INTRAMUSCULAR | Status: DC | PRN

## 2019-06-23 MED ORDER — VASOPRESSIN 20 UNIT/ML IV SOLN
INTRAVENOUS | Status: AC
  Filled 2019-06-23: qty 1

## 2019-06-23 MED ORDER — OXYCODONE HCL 5 MG PO TABS
10.0000 mg | ORAL_TABLET | ORAL | Status: DC | PRN
  Administered 2019-06-23: 10 mg via ORAL

## 2019-06-23 MED ORDER — PROPOFOL 10 MG/ML IV BOLUS
INTRAVENOUS | Status: DC | PRN
  Administered 2019-06-23: 150 mg via INTRAVENOUS

## 2019-06-23 MED ORDER — HYDROMORPHONE HCL 1 MG/ML IJ SOLN: 0.2500 mg | INTRAMUSCULAR | Status: DC | PRN

## 2019-06-23 MED ORDER — HEPARIN SODIUM (PORCINE) 1000 UNIT/ML IJ SOLN
INTRAMUSCULAR | Status: AC
  Filled 2019-06-23: qty 1

## 2019-06-23 MED ORDER — FENTANYL CITRATE (PF) 250 MCG/5ML IJ SOLN
INTRAMUSCULAR | Status: AC
  Filled 2019-06-23: qty 5

## 2019-06-23 MED ORDER — SUGAMMADEX SODIUM 200 MG/2ML IV SOLN
INTRAVENOUS | Status: DC | PRN
  Administered 2019-06-23: 245 mg via INTRAVENOUS

## 2019-06-23 MED ORDER — SODIUM CHLORIDE 0.9 % IR SOLN
Status: DC | PRN
  Administered 2019-06-23: 1000 mL

## 2019-06-23 MED ORDER — ACETAMINOPHEN 160 MG/5ML PO SOLN: 325.0000 mg | Freq: Once | ORAL | Status: DC | PRN

## 2019-06-23 MED ORDER — OXYCODONE HCL 5 MG PO TABS
ORAL_TABLET | ORAL | Status: AC
  Filled 2019-06-23: qty 2

## 2019-06-23 MED ORDER — ACETAMINOPHEN 10 MG/ML IV SOLN: 1000.0000 mg | Freq: Once | INTRAVENOUS | Status: DC | PRN

## 2019-06-23 MED ORDER — LIDOCAINE 2% (20 MG/ML) 5 ML SYRINGE
INTRAMUSCULAR | Status: DC | PRN
  Administered 2019-06-23: 40 mg via INTRAVENOUS

## 2019-06-23 MED ORDER — PROMETHAZINE HCL 25 MG/ML IJ SOLN: 6.2500 mg | INTRAMUSCULAR | Status: DC | PRN

## 2019-06-23 MED ORDER — ROCURONIUM BROMIDE 10 MG/ML (PF) SYRINGE
PREFILLED_SYRINGE | INTRAVENOUS | Status: AC
  Filled 2019-06-23: qty 10

## 2019-06-23 MED ORDER — LACTATED RINGERS IV SOLN: INTRAVENOUS | Status: DC

## 2019-06-23 MED ORDER — MIDAZOLAM HCL 2 MG/2ML IJ SOLN
INTRAMUSCULAR | Status: AC
  Filled 2019-06-23: qty 2

## 2019-06-23 SURGICAL SUPPLY — 68 items
APL SKNCLS STERI-STRIP NONHPOA (GAUZE/BANDAGES/DRESSINGS)
BENZOIN TINCTURE PRP APPL 2/3 (GAUZE/BANDAGES/DRESSINGS) IMPLANT
BLADE CLIPPER SURG (BLADE) ×1 IMPLANT
BLADE SURG 10 STRL SS (BLADE) IMPLANT
BLADE SURG 15 STRL LF DISP TIS (BLADE) IMPLANT
BLADE SURG 15 STRL SS (BLADE)
BNDG GAUZE ELAST 4 BULKY (GAUZE/BANDAGES/DRESSINGS) IMPLANT
CANISTER SUCT 3000ML PPV (MISCELLANEOUS) ×3 IMPLANT
CANISTER WOUND CARE 500ML ATS (WOUND CARE) ×3 IMPLANT
CLIP VESOCCLUDE SM WIDE 24/CT (CLIP) IMPLANT
CNTNR URN SCR LID CUP LEK RST (MISCELLANEOUS) IMPLANT
CONT SPEC 4OZ STRL OR WHT (MISCELLANEOUS)
COVER SURGICAL LIGHT HANDLE (MISCELLANEOUS) ×2 IMPLANT
DRAPE CHEST BREAST 15X10 FENES (DRAPES) ×2 IMPLANT
DRAPE LAPAROSCOPIC ABDOMINAL (DRAPES) ×1 IMPLANT
DRAPE SLUSH/WARMER DISC (DRAPES) IMPLANT
DRSG AQUACEL AG ADV 3.5X14 (GAUZE/BANDAGES/DRESSINGS) ×1 IMPLANT
DRSG CUTIMED SORBACT 7X9 (GAUZE/BANDAGES/DRESSINGS) ×2 IMPLANT
DRSG PAD ABDOMINAL 8X10 ST (GAUZE/BANDAGES/DRESSINGS) ×1 IMPLANT
DRSG VAC ATS LRG SENSATRAC (GAUZE/BANDAGES/DRESSINGS) ×1 IMPLANT
DRSG VAC ATS MED SENSATRAC (GAUZE/BANDAGES/DRESSINGS) ×1 IMPLANT
DRSG VAC ATS SM SENSATRAC (GAUZE/BANDAGES/DRESSINGS) ×3 IMPLANT
ELECT BLADE 4.0 EZ CLEAN MEGAD (MISCELLANEOUS) ×3
ELECT REM PT RETURN 9FT ADLT (ELECTROSURGICAL) ×3
ELECTRODE BLDE 4.0 EZ CLN MEGD (MISCELLANEOUS) IMPLANT
ELECTRODE REM PT RTRN 9FT ADLT (ELECTROSURGICAL) ×1 IMPLANT
GAUZE SPONGE 4X4 12PLY STRL (GAUZE/BANDAGES/DRESSINGS) ×1 IMPLANT
GAUZE XEROFORM 5X9 LF (GAUZE/BANDAGES/DRESSINGS) IMPLANT
GLOVE BIO SURGEON STRL SZ7.5 (GLOVE) ×8 IMPLANT
GLOVE BIOGEL PI IND STRL 6 (GLOVE) IMPLANT
GLOVE BIOGEL PI INDICATOR 6 (GLOVE) ×4
GLOVE SURG SIGNA 7.5 PF LTX (GLOVE) ×3 IMPLANT
GOWN STRL REUS W/ TWL LRG LVL3 (GOWN DISPOSABLE) ×2 IMPLANT
GOWN STRL REUS W/TWL LRG LVL3 (GOWN DISPOSABLE) ×9
HANDPIECE INTERPULSE COAX TIP (DISPOSABLE) ×6
HEMOSTAT POWDER SURGIFOAM 1G (HEMOSTASIS) IMPLANT
HEMOSTAT SURGICEL 2X14 (HEMOSTASIS) IMPLANT
KIT BASIN OR (CUSTOM PROCEDURE TRAY) ×3 IMPLANT
KIT SUCTION CATH 14FR (SUCTIONS) IMPLANT
KIT TURNOVER KIT B (KITS) ×3 IMPLANT
MICROMATRIX 1000MG (Tissue) ×6 IMPLANT
NDL 18GX1X1/2 (RX/OR ONLY) (NEEDLE) IMPLANT
NEEDLE 18GX1X1/2 (RX/OR ONLY) (NEEDLE) ×3 IMPLANT
NS IRRIG 1000ML POUR BTL (IV SOLUTION) ×3 IMPLANT
PACK GENERAL/GYN (CUSTOM PROCEDURE TRAY) ×3 IMPLANT
PAD ARMBOARD 7.5X6 YLW CONV (MISCELLANEOUS) ×6 IMPLANT
PENCIL BUTTON HOLSTER BLD 10FT (ELECTRODE) ×2 IMPLANT
SET HNDPC FAN SPRY TIP SCT (DISPOSABLE) ×1 IMPLANT
SOLUTION PARTIC MCRMTRX 1000MG (Tissue) IMPLANT
SPONGE LAP 4X18 RFD (DISPOSABLE) ×2 IMPLANT
STAPLER VISISTAT 35W (STAPLE) IMPLANT
SURGILUBE 2OZ TUBE FLIPTOP (MISCELLANEOUS) ×2 IMPLANT
SUT ETHILON 3 0 FSL (SUTURE) IMPLANT
SUT VIC AB 1 CTX 36 (SUTURE)
SUT VIC AB 1 CTX36XBRD ANBCTR (SUTURE) IMPLANT
SUT VIC AB 2-0 CTX 27 (SUTURE) IMPLANT
SUT VIC AB 3-0 SH 8-18 (SUTURE) ×2 IMPLANT
SUT VIC AB 3-0 X1 27 (SUTURE) IMPLANT
SWAB COLLECTION DEVICE MRSA (MISCELLANEOUS) ×1 IMPLANT
SWAB CULTURE ESWAB REG 1ML (MISCELLANEOUS) ×1 IMPLANT
SYR 10ML LL (SYRINGE) ×2 IMPLANT
SYR 5ML LUER SLIP (SYRINGE) ×2 IMPLANT
TOWEL GREEN STERILE (TOWEL DISPOSABLE) ×3 IMPLANT
TOWEL GREEN STERILE FF (TOWEL DISPOSABLE) ×3 IMPLANT
TRAY FOLEY MTR SLVR 16FR STAT (SET/KITS/TRAYS/PACK) IMPLANT
TUBE CONNECTING 20'X1/4 (TUBING) ×1
TUBE CONNECTING 20X1/4 (TUBING) ×1 IMPLANT
WATER STERILE IRR 1000ML POUR (IV SOLUTION) ×1 IMPLANT

## 2019-06-23 NOTE — Discharge Instructions (Signed)
Patient to bring wound VAC sponge and supplies to office appointment  Wound VAC will be changed weekly at Dr. Zenaida Niece Trigt's office

## 2019-06-23 NOTE — Anesthesia Postprocedure Evaluation (Signed)
Anesthesia Post Note  Patient: Herbert Walsh  Procedure(s) Performed: irrigation and clean out right shoulder (Right Chest) APPLICATION OF WOUND VAC (Right Chest)     Patient location during evaluation: PACU Anesthesia Type: General Level of consciousness: awake and alert Pain management: pain level controlled Vital Signs Assessment: post-procedure vital signs reviewed and stable Respiratory status: spontaneous breathing, nonlabored ventilation, respiratory function stable and patient connected to nasal cannula oxygen Cardiovascular status: blood pressure returned to baseline and stable Postop Assessment: no apparent nausea or vomiting Anesthetic complications: no    Last Vitals:  Vitals:   06/23/19 0930 06/23/19 1024  BP: 114/72   Pulse: 98   Resp: 16 13  Temp: 36.6 C   SpO2: 97% 93%    Last Pain:  Vitals:   06/23/19 1024  TempSrc:   PainSc: Asleep                 Shelton Silvas

## 2019-06-23 NOTE — Op Note (Signed)
NAME: REMER, COUSE MEDICAL RECORD [id number] ACCOUNT [id number] DATE OF BIRTH:1990-03-14 FACILITY: [facility] LOCATION: [facility] PHYSICIAN:Jeven Topper VAN TRIGT III, MD  OPERATIVE REPORT  DATE OF PROCEDURE:  06/23/2019  OPERATION: 1.  Wound irrigation and wound VAC change of right sternoclavicular joint and chest wall wound. 2.  Application of ACell product.  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  POSTOPERATIVE DIAGNOSIS:  Methocillin-susceptible Staphylococcus aureus infection of right sternoclavicular joint.  ANESTHESIA:  General.  DESCRIPTION OF PROCEDURE:  After informed consent was documented and the proper site marked the patient was brought to the operating.  He was placed supine on the operating table and general anesthesia was induced.  The previously placed wound VAC  dressing was removed.  The wound VAC sponge was removed.  The chest was prepped and draped as a sterile field.  A proper time-out was performed.  Retractor was used to spread the soft tissue.  The wound was inspected.  There was minimal purulence and the wound was dry.  Some devitalized fat was removed.  The wound was irrigated with a liter of vancomycin pulse lavage irrigation.  Hemostasis was  adequate.  The bone was rasped on its edges (right clavicle).  ACell paste was placed in the tunnel areas at each end of the opening and then ACell powder was used to fill in the main portion of the wound.  Over this, a small wound VAC sponge was cut to  the appropriate size and configuration and placed in the wound, covered with wound VAC dressings and suction line.  The patient was reversed from anesthesia and returned to recovery room in good condition.  CN/NUANCE  D:06/23/2019 T:06/23/2019 JOB:[id number]

## 2019-06-23 NOTE — Brief Op Note (Addendum)
06/23/2019  9:13 AM  PATIENT:  Herbert Walsh  29 y.o. male  PRE-OPERATIVE DIAGNOSIS:  shoulder wound  POST-OPERATIVE DIAGNOSIS:  shoulder wound  PROCEDURE:  Procedure(s): irrigation and clean out right shoulder (Right) APPLICATION OF WOUND VAC (Right)  SURGEON:  Surgeon(s) and Role:    [name], MD - Primary  PHYSICIAN ASSISTANT:   ASSISTANTS: none   ANESTHESIA:   general  EBL:  50 mL   BLOOD ADMINISTERED:none  DRAINS: small wound vac sponge   LOCAL MEDICATIONS USED:  NONE  SPECIMEN:  No Specimen  DISPOSITION OF SPECIMEN:  N/A  COUNTS:  YES  TOURNIQUET:  * No tourniquets in log *  DICTATION: .Dragon Dictation  PLAN OF CARE: return to 2 C  PATIENT DISPOSITION:  PACU - hemodynamically stable.   Delay start of Pharmacological VTE agent (>24hrs) due to surgical blood loss or risk of bleeding: yes  The wound was much cleaner and without purulence. A Cell product was applied. Small wound VAC placed over Sorbact which will be changed in clinic in one week. Plan DC home tomorrow on IV Ancef and home wound VAC- Face to face completed

## 2019-06-23 NOTE — Transfer of Care (Signed)
Immediate Anesthesia Transfer of Care Note  Patient: Herbert Walsh  Procedure(s) Performed: irrigation and clean out right shoulder (Right Chest) APPLICATION OF WOUND VAC (Right Chest)  Patient Location: PACU  Anesthesia Type:General  Level of Consciousness: awake, alert  and oriented  Airway & Oxygen Therapy: Patient Spontanous Breathing and Patient connected to nasal cannula oxygen  Post-op Assessment: Report given to RN, Post -op Vital signs reviewed and stable and Patient moving all extremities X 4  Post vital signs: Reviewed and stable  Last Vitals:  Vitals Value Taken Time  BP    Temp    Pulse 103 06/23/19 0901  Resp 14 06/23/19 0901  SpO2 96 % 06/23/19 0901  Vitals shown include unvalidated device data.  Last Pain:  Vitals:   06/23/19 0521  TempSrc:   PainSc: 7       Patients Stated Pain Goal: 2 (06/23/19 0000)  Complications: No apparent anesthesia complications

## 2019-06-23 NOTE — Plan of Care (Signed)

## 2019-06-23 NOTE — Anesthesia Preprocedure Evaluation (Addendum)
Anesthesia Evaluation  Patient identified by MRN, date of birth, ID band Patient awake    Reviewed: Allergy & Precautions, NPO status , Patient's Chart, lab work & pertinent test results  Airway Mallampati: IV  TM Distance: >3 FB Neck ROM: Full    Dental  (+) Teeth Intact, Dental Advisory Given, Poor Dentition   Pulmonary Current Smoker,    breath sounds clear to auscultation       Cardiovascular hypertension, Pt. on medications  Rhythm:Regular Rate:Normal     Neuro/Psych PSYCHIATRIC DISORDERS Anxiety Depression Bipolar Disorder Schizophrenia    GI/Hepatic Neg liver ROS, GERD  Medicated,  Endo/Other  negative endocrine ROS  Renal/GU negative Renal ROS     Musculoskeletal  (+) Arthritis ,   Abdominal (+) + obese,   Peds  Hematology negative hematology ROS (+)   Anesthesia Other Findings   Reproductive/Obstetrics                            Anesthesia Physical Anesthesia Plan  ASA: III  Anesthesia Plan: General   Post-op Pain Management:    Induction: Intravenous  PONV Risk Score and Plan: 2 and Ondansetron and Midazolam  Airway Management Planned: Oral ETT  Additional Equipment: None  Intra-op Plan:   Post-operative Plan: Extubation in OR  Informed Consent: I have reviewed the patients History and Physical, chart, labs and discussed the procedure including the risks, benefits and alternatives for the proposed anesthesia with the patient or authorized representative who has indicated his/her understanding and acceptance.       Plan Discussed with: CRNA  Anesthesia Plan Comments: (Echo:  1. Normal LV function; no vegetations.  2. Left ventricular ejection fraction, by estimation, is 55 to 60%. The  left ventricle has normal function. The left ventricle has no regional  wall motion abnormalities.  3. Right ventricular systolic function is normal. The right ventricular   size is normal.  4. No left atrial/left atrial appendage thrombus was detected.  5. The mitral valve is normal in structure. Trivial mitral valve  regurgitation.  6. The aortic valve is tricuspid. Aortic valve regurgitation is not  visualized.  7. Aortic dilatation noted. )      Anesthesia Quick Evaluation

## 2019-06-23 NOTE — TOC Progression Note (Addendum)
Transition of Care [facility]) - Progression Note    Patient Details  Name: [name] MRN: [id number] Date of Birth: [birth year]  [facility]) CM/SW Contact  Leone Haven, RN Phone Number: 06/23/2019, 2:59 PM  Clinical Narrative:    NCM spoke with Jeri Modena with [facility], she states patient is still active with [facility], informed her that patient is here in the hospital.  Patient also has a wound vac in place, NCM made referral to [facility] with [employer], she will escript Donielle PA so she can fill it out and sign the script for vac.  Plan is for patient to go home with wound vac and have it changed in the office per Donielle.  [facility] Nurse will just be doing picc care for iv abx.         Expected Discharge Plan and Services                                                 Social Determinants of Health (SDOH) Interventions    Readmission Risk Interventions No flowsheet data found.

## 2019-06-23 NOTE — Progress Notes (Signed)
Patient's wound is on the right sternoclavicular joint. Approximate measurements are 4.5 cm (length), 2.5 cm (depth), and 3 cm (width). Doree Fudge PA-C

## 2019-06-23 NOTE — Discharge Summary (Addendum)
Physician Discharge Summary       [facility]       [place], [address]             [phone]    Patient ID: Herbert Walsh MRN: [id number] DOB/AGE: 31-Apr-1992 31 y.o.  Admit date: 06/20/2019 Discharge date: 06/24/2019  Admission Diagnoses: 1. MSSA (methicillin susceptible Staphylococcus aureus) infection 2. Osteomyelitis ([place])  Discharge Diagnoses:  1. S/p I and D right [place] joint, pulse lavage irrigation of right [place] joint, and placement of wound VAC 2. History of weptic arthritis of right sternoclavicular joint ([place]) 3. History of Schizophrenia ([place]) 4. History of Bipolar disorder ([place]) 5. History of GERD (gastroesophageal reflux disease) 6. History of Ischemic colitis ([place]) 7. History of hypertension 8. History of tachycardia 9. History of anxiety 10. History of rheumatoid arthritis 11. History of anemia    Consults: Infectious disease  Procedure (s):  1.  Debridement (excisional) of the right sternoclavicular joint. 2.  Pulse lavage irrigation of the right sternoclavicular joint. 3.  Placement of wound VAC on wound bed of right sternoclavicular joint by Dr. Prescott Gum on 06/20/2019.  4. I and D right [place] joint and change wound VAC 06/23/2019.  History of Presenting Illness: Patient returns for outpatient follow-up after hospitalization for MSSA bacteremia with cellulitis of the right sternoclavicular joint.  He has history of bipolar disorder, substance abuse and smoking.  During the admission he had no discrete abscess and his white count and markers of infection improved on IV antibiotics.  He also needed right knee arthroscopy and irrigation by orthopedics for infection.  Echocardiogram showed no evidence of endocarditis.  Patient was discharged with a PICC line for home IV antibiotics and further follow-up with repeat scan was scheduled.  The patient has been on home IV Ancef.  He has had no fever but the pain in his right shoulder has increased.   CT scan  of the chest shows the sternoclavicular joint now to have evidence of osteomyelitis with some bone destruction and fluid collection. No pleural effusion or deep mediastinal involvement   Patient will need surgical debridement of the right sternoclavicular joint under general anesthesia.  This will be scheduled within 48 hours at [facility].  I have discussed the procedure with the patient including the benefits alternatives and risks of bleeding and recurrent infection.  He agrees to be admitted for surgery of his right sternoclavicular joint. Potential risks, benefits, and complications of the surgery were discussed with the patient and he agreed to proceed. He was admitted on 06/20/2019 in order to undergo an I and D, pulse lavage irrigation, and wound VAC placement.  Brief Hospital Course:  Patient's IV Cefazolin (Staph Aureus) has been continued post operatively. Patient remained afebrile and hemodynamically stable. Infectious disease was consulted and IV antibiotic is needed for a total of 6 weeks from last surgery. Patient returned to OR onn 06/23/2019 in order to undergo further I and D and wound vac change of right  joint. Patient is felt surgically stable for discharge today. Home health has been arranged for administration of IV antibiotic. Wound VAC will be change in the office on 06/30/2019. Patient also has a follow up appointment with Dr. Linus Salmons (infectious disease).  Latest Vital Signs: Blood pressure 114/80, pulse 77, temperature 97.7 F (36.5 C), temperature source Oral, resp. rate 14, height 6' (1.829 m), weight 122.5 kg, SpO2 99 %.  Physical Exam: Cardiovascular: RRR Pulmonary: Clear to auscultation bilaterally Abdomen: Soft, non tender,  bowel sounds present. Extremities: No lower extremity edema. Wounds: VAC in place over right [place] joint    Discharge Condition: Stable and discharged to home.  Recent laboratory studies:  Lab Results  Component Value Date   WBC 11.8 (H)  06/24/2019   HGB 8.6 (L) 06/24/2019   HCT 28.0 (L) 06/24/2019   MCV 79.1 (L) 06/24/2019   PLT 530 (H) 06/24/2019   Lab Results  Component Value Date   NA 137 06/24/2019   K 4.2 06/24/2019   CL 101 06/24/2019   CO2 27 06/24/2019   CREATININE 0.79 06/24/2019   GLUCOSE 123 (H) 06/24/2019      Diagnostic Studies: DG Orthopantogram  Result Date: 06/01/2019 CLINICAL DATA:  Bacteremia. EXAM: ORTHOPANTOGRAM/PANORAMIC COMPARISON:  None. FINDINGS: No fracture or dislocation is seen involving the mandible. No lytic destruction is seen involving the mandible. Erosion of left posterior molar in the maxilla is noted suggesting dental disease. IMPRESSION: Erosion of left posterior molar in the maxilla is noted suggesting dental disease. No fracture is noted. No lytic destruction is seen involving the mandible. No lytic destruction is noted. Electronically Signed   By: Marijo Conception M.D.   On: 06/01/2019 14:50   DG Chest 2 View  Result Date: 06/20/2019 CLINICAL DATA:  Preop for debridement of sternoclavicular abscess. EXAM: CHEST - 2 VIEW COMPARISON:  CT of the chest 06/18/2019 FINDINGS: The heart size and mediastinal contours are within normal limits. Both lungs are clear. The visualized skeletal structures are unremarkable. Medial right clavicular erosions are obscured by underlying soft tissues. IMPRESSION: No active cardiopulmonary disease. Electronically Signed   By: San Morelle M.D.   On: 06/20/2019 06:38   DG Chest 2 View  Result Date: 05/30/2019 CLINICAL DATA:  Chest soreness. Additional provided: Right clavicle and right shoulder pain toward neck. EXAM: CHEST - 2 VIEW COMPARISON:  No pertinent prior studies available for comparison. FINDINGS: Heart size within normal limits. No evidence of airspace consolidation within the lungs. No evidence of pleural effusion or pneumothorax. No acute bony abnormality. IMPRESSION: No evidence of acute cardiopulmonary abnormality. Electronically Signed    By: Kellie Simmering DO   On: 05/30/2019 11:23   CT Chest W Contrast  Result Date: 06/18/2019 CLINICAL DATA:  Follow-up right sternoclavicular joint fluid collection. EXAM: CT CHEST WITH CONTRAST TECHNIQUE: Multidetector CT imaging of the chest was performed during intravenous contrast administration. CONTRAST:  43m ISOVUE-300 IOPAMIDOL (ISOVUE-300) INJECTION 61% COMPARISON:  06/02/2019 FINDINGS: Cardiovascular: The heart size appears within normal limits. There is no pericardial effusion identified. Mediastinum/Nodes: Normal appearance of the thyroid gland. The trachea appears patent and is midline. Normal appearance of the esophagus. No enlarged mediastinal or hilar adenopathy. No supraclavicular or axillary adenopathy. Lungs/Pleura: No pleural effusion. No suspicious pulmonary nodule or mass identified. No airspace consolidation. Upper Abdomen: No acute abnormality. Musculoskeletal: Since 06/02/2019 there is now periarticular erosions along both sides of the right sternoclavicular joint compatible with septic arthritis and osteomyelitis, image 18/2 and image 27/2. New right sternoclavicular joint effusion measures 3.2 x 2.2 cm, image 27/2. A sinus tract extends from the joint space along the right sternoclavicular joint, image 4/2. Surrounding soft tissue swelling and subcutaneous fat stranding is identified. IMPRESSION: 1. Significant progression of right sternoclavicular joint inflammatory arthropathy. New large periarticular erosions along both sides of the right sternoclavicular joint compatible with septic arthritis and osteomyelitis. 2. Right sternoclavicular joint effusion is identified with a sinus tract extending from the joint space along the right sternoclavicular joint. These results  will be called to the ordering clinician or representative by the Radiologist Assistant, and communication documented in the PACS or [employer]. Electronically Signed   By: Kerby Moors M.D.   On: 06/18/2019  10:06   CT CHEST W CONTRAST  Result Date: 06/02/2019 CLINICAL DATA:  History of bilateral sternoclavicular joint septic arthritis/osteomyelitis. EXAM: CT CHEST WITH CONTRAST TECHNIQUE: Multidetector CT imaging of the chest was performed during intravenous contrast administration. CONTRAST:  46m OMNIPAQUE IOHEXOL 300 MG/ML  SOLN COMPARISON:  05/30/2019 FINDINGS: Cardiovascular: The heart size is normal. No substantial pericardial effusion. Mediastinum/Nodes: Abnormal soft tissue posterior to the sternum and sternoclavicular joints, right greater than left, is similar to 05/30/2019 CT chest. No mediastinal lymphadenopathy. There is no hilar lymphadenopathy. The esophagus has normal imaging features. There is no axillary lymphadenopathy. Lungs/Pleura: Minimal collapse/consolidative change noted in the dependent right lower lobe with small bilateral pleural effusions, right greater than left. Upper Abdomen: Unremarkable. Musculoskeletal: No worrisome lytic or sclerotic osseous abnormality.In the interval since that prior CT, there has been asymmetric enlargement of the medial aspect of the right pectoralis major muscle and the right sternocleidomastoid muscle. Although quite subtle, there does appear to be a focal low-attenuation lesion in the right sternocleido mastoid muscle (see axial image 33/3 and coronal image 110/6). This lesion measures 3.1 x 2.6 x 4.2 cm and is suspicious for an intramuscular fluid collection. IMPRESSION: 1. Although quite subtle, there appears to be an intramuscular fluid collection involving the right sternocleidomastoid muscle, new since CT of 05/30/2019. Given the history of sternoclavicular joint infection, intramuscular abscess would be a concern. Given that the finding is so subtle, MRI is recommended to confirm. Ultrasound may be able to further assess. 2. Tiny bilateral pleural effusions with some minimal collapse/consolidative change in the dependent right lower lobe. 3. Similar  appearance of the retrosternal soft tissue attenuation described on the prior CT. Electronically Signed   By: EMisty StanleyM.D.   On: 06/02/2019 11:45   CT Angio Chest PE W and/or Wo Contrast  Result Date: 05/30/2019 CLINICAL DATA:  Shortness of breath. EXAM: CT ANGIOGRAPHY CHEST WITH CONTRAST TECHNIQUE: Multidetector CT imaging of the chest was performed using the standard protocol during bolus administration of intravenous contrast. Multiplanar CT image reconstructions and MIPs were obtained to evaluate the vascular anatomy. CONTRAST:  1062mOMNIPAQUE IOHEXOL 350 MG/ML SOLN COMPARISON:  None. FINDINGS: Cardiovascular: Satisfactory opacification of the pulmonary arteries to the segmental level. No evidence of pulmonary embolism. Normal heart size. No pericardial effusion. Mediastinum/Nodes: No enlarged mediastinal, hilar, or axillary lymph nodes. Thyroid gland, trachea, and esophagus demonstrate no significant findings. Lungs/Pleura: Lungs are clear. No pleural effusion or pneumothorax. Upper Abdomen: No acute abnormality. Musculoskeletal: There appears to be soft tissue prominence around the right sternoclavicular joint with mild inflammatory changes suggesting inflammation of the joint space or possibly septic arthritis. Alternatively it may represent some form of neoplasm. Review of the MIP images confirms the above findings. IMPRESSION: No definite evidence of pulmonary embolus. Soft tissue prominence and inflammatory changes are noted around the right sternoclavicular joint suggesting possible septic arthritis or other inflammation of the joint space. Alternatively it may represent neoplasm. MRI is recommended for further evaluation. Electronically Signed   By: JaMarijo Conception.D.   On: 05/30/2019 16:18   MR STERNUM W WO CONTRAST  Result Date: 05/31/2019 CLINICAL DATA:  Inflammatory changes right sternoclavicular joint on prior CT chest, concern for septic arthritis EXAM: MRI sternum with bowel and  with CONTRAST TECHNIQUE:  Multiplanar, multiecho pulse sequences of the sternoclavicular joints before and after the intravenous administration of gadolinium. CONTRAST:  60m GADAVIST GADOBUTROL 1 MMOL/ML IV SOLN COMPARISON:  05/30/2019 FINDINGS: Bones Evaluation is limited by patient motion throughout the study. There is abnormal edema within the bilateral clavicular heads, left greater than right. Edema is also seen within the sternal manubrium abutting the sternoclavicular joints, left greater than right. The areas of marrow edema demonstrate enhancement, left more pronounced than right. Joints Hypertrophic changes are seen at the bilateral sternoclavicular joints, right greater than left. There is a small amount of fluid within the right sternoclavicular joint. Other findings There is extensive soft tissue edema surrounding the manubrium and right sternoclavicular joint. The edema extends into the anterior mediastinum to the level of the manubriosternal junction. Edema also involves the right supraclavicular region and inferior aspect of the right sternocleidomastoid muscle. There are rim enhancing fluid collections surrounding the right sternoclavicular joint, extending into the right supraclavicular region and lateral margin of the right sternocleidomastoid muscle, compatible with abscesses. The abscess in the right supraclavicular region/sternocleidomastoid muscle measures 2.7 x 2.1 cm in transverse dimension, and extends approximately 6.3 cm in craniocaudal length. There is a smaller abscess just above the sternal notch measuring 1.2 x 1.3 cm. There is extensive edema in the subcutaneous tissues right supraclavicular region and base of neck. Multiple small reactive lymph nodes are seen in the right supraclavicular region. IMPRESSION: 1. Findings consistent with septic arthritis and osteomyelitis of the sternoclavicular joints. 2. Multiple abscesses surrounding the right sternoclavicular joint and extending  into the right supraclavicular tissues and right sternocleidomastoid muscle as above. Electronically Signed   By: MRanda NgoM.D.   On: 05/31/2019 02:21   CT TIBIA FIBULA RIGHT W CONTRAST  Result Date: 05/30/2019 CLINICAL DATA:  Right lower extremity pain and swelling. EXAM: CT OF THE LOWER RIGHT EXTREMITY WITH CONTRAST TECHNIQUE: Multidetector CT imaging of the lower right extremity was performed according to the standard protocol following intravenous contrast administration. COMPARISON:  None. CONTRAST:  1027mOMNIPAQUE IOHEXOL 350 MG/ML SOLN FINDINGS: There is a moderate-sized knee joint effusion with typical rim like synovial enhancement. I do not see any destructive bony changes to suggest septic arthritis. There is a fluid collection in the popliteal fossa which may be fluid tracking back along the popliteus tendon. It is not a Baker's cyst. There is also fluid between the gastroc and soleus muscles and fluid tracking down along the medial soleus muscle. Findings could be due to a ruptured cyst. Mild subcutaneous soft tissue swelling/edema but no discrete fluid collection to suggest an abscess. No findings to suggest myofasciitis or pyomyositis. The major venous structures appear patent. I do not see any definite findings for deep venous thrombosis. The bony structures are unremarkable. IMPRESSION: 1. Moderate-sized knee joint effusion with typical rim like synovial enhancement. I do not see any destructive cartilage or bony changes to suggest septic arthritis. 2. Fluid collection in the popliteal fossa may be fluid tracking back along the popliteus tendon. It is not a Baker's cyst. 3. Fluid tracking down along the medial soleus muscle and between the gastroc and soleus muscles. Findings could be due to a ruptured cyst. 4. No findings to suggest myofasciitis or pyomyositis. 5. Patent major venous structures. Electronically Signed   By: P.Marijo Sanes.D.   On: 05/30/2019 16:09   MR TIBIA FIBULA  RIGHT W WO CONTRAST  Result Date: 05/31/2019 CLINICAL DATA:  Soft tissue infection, query compartment syndrome. EXAM: MRI OF  LOWER RIGHT EXTREMITY WITHOUT AND WITH CONTRAST TECHNIQUE: Multiplanar, multisequence MR imaging of the right tibia/fibula was performed both before and after administration of intravenous contrast. CONTRAST:  71m GADAVIST GADOBUTROL 1 MMOL/ML IV SOLN COMPARISON:  CT scan 05/30/2019 FINDINGS: Despite efforts by the technologist and patient, motion artifact is present on today's exam and could not be eliminated. This reduces exam sensitivity and specificity. Bones/Joint/Cartilage Knee effusion on the right. Possibly ruptured right Baker's cyst. No significant degree of subcortical marrow edema along the knee. No findings of osteomyelitis in the tibia/fibula. Fairly thin synovial head enhancement along the knee effusion, the degree of synovial enhancement would be unusually small for septic joint. Ligaments N/A Muscles and Tendons There is accentuated edema and enhancement proximally in the anterior compartment of the lower leg including within along the tibialis anterior and extensor digitorum longus. There is also abnormal infiltrative edema and enhancement within along parts of the posterior compartment including the tibialis posterior, flexor digitorum longus, proximal soleus muscle, the anterior portion of medial head gastrocnemius, and a small portion of the anteromedial portion of the lateral head gastrocnemius. Edema and enhancement within along the popliteus muscle. Abnormal edema and enhancement tracks along the adjacent fascia planes in the posterior compartment, particularly between the soleus and gastrocnemius musculature and along the superficial fascia margin of the left calf where there is a fluid collection posteromedially measuring about 0.5 by 3.8 by 9.5 cm (volume = 9 cm^3), partially shown on image 59/29. This only has a thin rim of enhancement and is nonspecific for  abscess. I not observed gas tracking along musculature or fascia planes. Plantaris muscle poorly seen. Soft tissues Subcutaneous edema is primarily anterior in the proximal portion of the lower leg but extends more circumferentially distally, although maintains and anteromedial confluence. IMPRESSION: 1. Abnormal edema within muscle groups and along fascia planes with associated enhancement especially proximally in the anterior and posterior compartments of the lower leg. Myositis with fasciitis is suspected. The lower portions of the compartments are not as involved. We do not demonstrate diffuse muscular or diffuse compartmental accentuated edema to further indicate risk for compartment syndrome although compartmental pressures may still warranted if clinical indicators of compartment syndrome are present. Overlying cellulitis noted. Small fluid collection tracking along the superficial fascia margin of the posterior compartment posteromedially, although without the thick enhancing margin characteristic of an abscess. 2. Knee effusion with only minimal synovial enhancement and no findings of abnormal osseous activity to suggest osteomyelitis. In general with this minimal degree of synovial enhancement I am skeptical of septic joint. 3. No osteomyelitis. Electronically Signed   By: WVan ClinesM.D.   On: 05/31/2019 09:42   CT ASPIRATION  Result Date: 06/04/2019 CLINICAL DATA:  Intramuscular fluid collection, right sternocleidomastoid muscle. EXAM: CT GUIDED NEEDLE ASPIRATE BIOPSY OF INTRAMUSCULAR ABSCESS ANESTHESIA/SEDATION: Intravenous Fentanyl 1534m and Versed 56m52mere administered as conscious sedation during continuous monitoring of the patient's level of consciousness and physiological / cardiorespiratory status by the radiology RN, with a total moderate sedation time of less than 20 minutes. PROCEDURE: The procedure risks, benefits, and alternatives were explained to the patient. Questions  regarding the procedure were encouraged and answered. The patient understands and consents to the procedure. Select axial scans through the lower neck and upper chest were obtained. The right intramuscular collection was localized and an appropriate skin entry site was determined and marked. The operative field was prepped with chlorhexidinein a sterile fashion, and a sterile drape was applied covering the operative field.  A sterile gown and sterile gloves were used for the procedure. Local anesthesia was provided with 1% Lidocaine. Under CT fluoroscopic guidance, a 7 cm multi sidehole Yueh sheath needle advanced into the right sternocleidomastoid intramuscular gas and fluid collection. Approximately 6 mL thin purulent material were aspirated, sent for Gram stain and culture. Postprocedure scans show no hemorrhage or other apparent complication. The patient tolerated the procedure well. COMPLICATIONS: None immediate FINDINGS: Intramuscular gas and fluid collection identified within the right sternocleidomastoid muscle, which is enlarged compared to the contralateral. 6 mL thin purulent material were aspirated. IMPRESSION: 1. Technically successful CT-guided aspiration right sternocleidomastoid intramuscular abscess. Electronically Signed   By: Lucrezia Europe M.D.   On: 06/04/2019 13:01   DG Chest Port 1 View  Result Date: 06/21/2019 CLINICAL DATA:  Recent right sternoclavicular joint abscess debridement EXAM: PORTABLE CHEST 1 VIEW COMPARISON:  Chest radiograph Jun 20, 2019; chest CT Jun 18, 2019 FINDINGS: Lungs are clear. Heart is upper normal in size with pulmonary vascularity within normal limits. Central catheter tip is in the superior vena cava. Endotracheal tube has been removed. No adenopathy evident. The medial right clavicle appears to have been removed. Other bony structures appear unremarkable. IMPRESSION: No edema or airspace opacity. Cardiac silhouette within normal limits. Central catheter tip in superior  vena cava. Apparent absence of medial right clavicle. Electronically Signed   By: Lowella Grip III M.D.   On: 06/21/2019 09:10   DG Chest Portable 1 View  Result Date: 06/20/2019 CLINICAL DATA:  Hypoxia.  Status post thoracic surgery EXAM: PORTABLE CHEST 1 VIEW COMPARISON:  Jun 20, 2019 study obtained earlier in the day FINDINGS: Endotracheal tube tip is 4.9 cm above the carina. Central catheter tip is in the superior vena cava. No pneumothorax. There is interstitial edema. There is consolidation in portions of the right upper lobe and left base. Heart is upper normal in size with pulmonary vascularity normal. No adenopathy. No bone lesions. IMPRESSION: Tube and catheter positions as described without pneumothorax. Interstitial edema. Airspace opacity in the right upper lobe and left base regions noted. These areas of opacity may represent alveolar edema. A degree early pneumonia or possible aspiration are differential considerations for these areas of airspace opacity. Heart upper normal in size. Electronically Signed   By: Lowella Grip III M.D.   On: 06/20/2019 10:15   DG Chest Port 1 View  Result Date: 06/01/2019 CLINICAL DATA:  Chest pain, sepsis, lower extremity swelling EXAM: PORTABLE CHEST 1 VIEW COMPARISON:  05/30/2019 chest radiograph. FINDINGS: Stable cardiomediastinal silhouette with normal heart size. No pneumothorax. No pleural effusion. Lungs appear clear, with no acute consolidative airspace disease and no pulmonary edema. IMPRESSION: No active disease. Electronically Signed   By: Ilona Sorrel M.D.   On: 06/01/2019 10:37   ECHOCARDIOGRAM COMPLETE  Result Date: 06/01/2019    ECHOCARDIOGRAM REPORT   Patient Name:   Herbert Walsh Date of Exam: 06/01/2019 Medical Rec #:  [id number]   Height:       72.0 in Accession #:    [id number]  Weight:       280.0 lb Date of Birth:  [birth year]   BSA:          2.458 m Patient Age:    29 years    BP:           127/69 mmHg Patient Gender: M           HR:  109 bpm. Exam Location:  Inpatient Procedure: 2D Echo, Color Doppler and Cardiac Doppler Indications:    Bactermia 790.7/R78.81  History:        Patient has no prior history of Echocardiogram examinations.                 Risk Factors:Hypertension and Dyslipidemia.  Sonographer:    Clayton Lefort RDCS (AE) Referring Phys: [place] Comments: Patient is morbidly obese. IMPRESSIONS  1. Left ventricular ejection fraction, by estimation, is 60 to 65%. The left ventricle has normal function. The left ventricle has no regional wall motion abnormalities. There is mild left ventricular hypertrophy. Left ventricular diastolic parameters were normal.  2. Right ventricular systolic function is normal. The right ventricular size is normal. Tricuspid regurgitation signal is inadequate for assessing PA pressure.  3. The mitral valve is normal in structure. No evidence of mitral valve regurgitation.  4. The aortic valve was not well visualized. Aortic valve regurgitation is not visualized. No aortic stenosis is present.  5. The inferior vena cava is normal in size with greater than 50% respiratory variability, suggesting right atrial pressure of 3 mmHg.  6. No vegetation seen, though technically difficult study. If clinical suspicion for endocarditis, consider TEE FINDINGS  Left Ventricle: Left ventricular ejection fraction, by estimation, is 60 to 65%. The left ventricle has normal function. The left ventricle has no regional wall motion abnormalities. The left ventricular internal cavity size was normal in size. There is  mild left ventricular hypertrophy. Left ventricular diastolic parameters were normal. Right Ventricle: The right ventricular size is normal. No increase in right ventricular wall thickness. Right ventricular systolic function is normal. Tricuspid regurgitation signal is inadequate for assessing PA pressure. Left Atrium: Left atrial size was normal in size. Right Atrium: Right atrial  size was normal in size. Pericardium: Trivial pericardial effusion is present. Mitral Valve: The mitral valve is normal in structure. No evidence of mitral valve regurgitation. MV peak gradient, 8.5 mmHg. The mean mitral valve gradient is 3.0 mmHg. Tricuspid Valve: The tricuspid valve is normal in structure. Tricuspid valve regurgitation is not demonstrated. Aortic Valve: The aortic valve was not well visualized. Aortic valve regurgitation is not visualized. No aortic stenosis is present. Aortic valve mean gradient measures 6.0 mmHg. Aortic valve peak gradient measures 10.8 mmHg. Aortic valve area, by VTI measures 2.54 cm. Pulmonic Valve: The pulmonic valve was not well visualized. Pulmonic valve regurgitation is not visualized. Aorta: The aortic root and ascending aorta are structurally normal, with no evidence of dilitation. Venous: The inferior vena cava is normal in size with greater than 50% respiratory variability, suggesting right atrial pressure of 3 mmHg. IAS/Shunts: The interatrial septum was not well visualized.  LEFT VENTRICLE PLAX 2D LVIDd:         4.70 cm  Diastology LVIDs:         3.20 cm  LV e' lateral: 8.58 cm/s LV PW:         1.50 cm  LV e' medial:  15.00 cm/s LV IVS:        1.60 cm LVOT diam:     2.30 cm LV SV:         69 LV SV Index:   28 LVOT Area:     4.15 cm  RIGHT VENTRICLE             IVC RV Basal diam:  2.50 cm     IVC diam: 1.50 cm RV S prime:  17.30 cm/s TAPSE (M-mode): 2.4 cm LEFT ATRIUM           Index       RIGHT ATRIUM           Index LA diam:      2.60 cm 1.06 cm/m  RA Area:     11.50 cm LA Vol (A2C): 31.7 ml 12.90 ml/m RA Volume:   23.80 ml  9.68 ml/m LA Vol (A4C): 62.0 ml 25.22 ml/m  AORTIC VALVE AV Area (Vmax):    2.43 cm AV Area (Vmean):   2.14 cm AV Area (VTI):     2.54 cm AV Vmax:           164.00 cm/s AV Vmean:          114.000 cm/s AV VTI:            0.270 m AV Peak Grad:      10.8 mmHg AV Mean Grad:      6.0 mmHg LVOT Vmax:         95.90 cm/s LVOT Vmean:         58.800 cm/s LVOT VTI:          0.165 m LVOT/AV VTI ratio: 0.61  AORTA Ao Root diam: 3.60 cm Ao Asc diam:  3.10 cm MITRAL VALVE MV Area (PHT): 4.89 cm  SHUNTS MV Peak grad:  8.5 mmHg  Systemic VTI:  0.16 m MV Mean grad:  3.0 mmHg  Systemic Diam: 2.30 cm MV Vmax:       1.46 m/s MV Vmean:      71.1 cm/s Oswaldo Milian MD Electronically signed by Oswaldo Milian MD Signature Date/Time: 06/01/2019/8:55:46 PM    Final    ECHO TEE  Result Date: 06/05/2019    TRANSESOPHOGEAL ECHO REPORT   Patient Name:   Herbert Walsh Date of Exam: 06/05/2019 Medical Rec #:  [id number]   Height:       72.0 in Accession #:    [id number]  Weight:       280.0 lb Date of Birth:  March 06, 1990   BSA:          2.458 m Patient Age:    29 years    BP:           124/80 mmHg Patient Gender: M           HR:           124 bpm. Exam Location:  Inpatient Procedure: Transesophageal Echo, Cardiac Doppler and Color Doppler Indications:     Bacteremia  History:         Patient has prior history of Echocardiogram examinations, most                  recent 06/01/2019. Signs/Symptoms:Bacteremia; Risk                  Factors:Hypertension and Dyslipidemia. RA, PTSD.  Sonographer:     Dustin Flock Referring Phys:  [place] Diagnosing Phys: Kirk Ruths MD PROCEDURE: The transesophogeal probe was passed without difficulty through the esophogus of the patient. Sedation performed by performing physician. The patient was monitored while under deep sedation. Anesthestetic sedation was provided intravenously by  Anesthesiology: 271m of Propofol. The patient developed no complications during the procedure. IMPRESSIONS  1. Normal LV function; no vegetations.  2. Left ventricular ejection fraction, by estimation, is 55 to 60%. The left ventricle has normal function. The left ventricle has no regional wall motion abnormalities.  3. Right ventricular systolic function is normal. The right ventricular size is normal.  4. No left atrial/left  atrial appendage thrombus was detected.  5. The mitral valve is normal in structure. Trivial mitral valve regurgitation.  6. The aortic valve is tricuspid. Aortic valve regurgitation is not visualized.  7. Aortic dilatation noted. FINDINGS  Left Ventricle: Left ventricular ejection fraction, by estimation, is 55 to 60%. The left ventricle has normal function. The left ventricle has no regional wall motion abnormalities. The left ventricular internal cavity size was normal in size. There is  no left ventricular hypertrophy. Right Ventricle: The right ventricular size is normal. Right ventricular systolic function is normal. Left Atrium: Left atrial size was normal in size. No left atrial/left atrial appendage thrombus was detected. Right Atrium: Right atrial size was normal in size. Pericardium: There is no evidence of pericardial effusion. Mitral Valve: The mitral valve is normal in structure. Trivial mitral valve regurgitation. Tricuspid Valve: The tricuspid valve is normal in structure. Tricuspid valve regurgitation is trivial. Aortic Valve: The aortic valve is tricuspid. Aortic valve regurgitation is not visualized. Pulmonic Valve: The pulmonic valve was normal in structure. Pulmonic valve regurgitation is not visualized. Aorta: There is minimal (Grade I) plaque involving the descending aorta. IAS/Shunts: No atrial level shunt detected by color flow Doppler. Additional Comments: Normal LV function; no vegetations. Kirk Ruths MD Electronically signed by Kirk Ruths MD Signature Date/Time: 06/05/2019/1:08:25 PM    Final    VAS [place] LOWER EXTREMITY VENOUS (DVT) (ONLY [facility] & [facility] 7a-7p)  Result Date: 05/30/2019  Lower Venous DVTStudy Indications: Pain, and Erythema.  Comparison Study: no prior Performing Technologist: June Leap RDMS, RVT  Examination Guidelines: A complete evaluation includes B-mode imaging, spectral Doppler, color Doppler, and power Doppler as needed of all accessible portions of each vessel.  Bilateral testing is considered an integral part of a complete examination. Limited examinations for reoccurring indications may be performed as noted. The reflux portion of the exam is performed with the patient in reverse Trendelenburg.  +---------+---------------+---------+-----------+----------+--------------+ RIGHT    CompressibilityPhasicitySpontaneityProperties               +---------+---------------+---------+-----------+----------+--------------+ CFV                                                   Not visualized +---------+---------------+---------+-----------+----------+--------------+ FV Prox  Full           Yes      Yes                                 +---------+---------------+---------+-----------+----------+--------------+ FV Mid   Full                                                        +---------+---------------+---------+-----------+----------+--------------+ FV DistalFull                                                        +---------+---------------+---------+-----------+----------+--------------+ PFV  Full                                                        +---------+---------------+---------+-----------+----------+--------------+ POP      Full           Yes      Yes                                 +---------+---------------+---------+-----------+----------+--------------+ PTV      Full                                                        +---------+---------------+---------+-----------+----------+--------------+ PERO     Full                                                        +---------+---------------+---------+-----------+----------+--------------+ Limited due to clothing interference  +----+---------------+---------+-----------+----------+--------------+ LEFTCompressibilityPhasicitySpontaneityProperties               +----+---------------+---------+-----------+----------+--------------+ CFV                                               Not visualized +----+---------------+---------+-----------+----------+--------------+     Summary: RIGHT: - There is no evidence of deep vein thrombosis in the lower extremity. However, portions of this examination were limited- see technologist comments above.  - No cystic structure found in the popliteal fossa. - Interstitial fluid noted in mid calf.   *See table(s) above for measurements and observations. Electronically signed by Monica Martinez MD on 05/30/2019 at 4:05:18 PM.    Final    [place] EKG SITE RITE  Result Date: 06/06/2019 If Site Rite image not attached, placement could not be confirmed due to current cardiac rhythm.      Discharge Instructions     [facility] pharmacist to adjust dose for Vancomycin, Aminoglycosides and other anti-infective therapies as requested by physician.   Complete by: As directed    [facility] to provide Cath Flo 82m   Complete by: As directed    Administer for PICC line occlusion and as ordered by physician for other access device issues.   Anaphylaxis Kit: Provided to treat any anaphylactic reaction to the medication being provided to the patient if First Dose or when requested by physician   Complete by: As directed    Epinephrine 134mml vial / amp: Administer 0.46m11m0.46ml946mubcutaneously once for moderate to severe anaphylaxis, nurse to call physician and pharmacy when reaction occurs and call 911 if needed for immediate care   Diphenhydramine 50mg37mIV vial: Administer 25-50mg 86mM PRN for first dose reaction, rash, itching, mild reaction, nurse to call physician and pharmacy when reaction occurs   Sodium Chloride 0.9% NS 500ml I97mdminister if needed for hypovolemic blood pressure drop or as ordered by physician after call to physician with  anaphylactic reaction   Change dressing on IV access line weekly and PRN   Complete by: As directed    Flush IV access with Sodium Chloride 0.9%  and Heparin 10 units/ml or 100 units/ml   Complete by: As directed    Home infusion instructions - [facility]   Complete by: As directed    Instructions: Flush IV access with Sodium Chloride 0.9% and Heparin 10units/ml or 100units/ml   Change dressing on IV access line: Weekly and PRN   Instructions Cath Flo 57m: Administer for PICC Line occlusion and as ordered by physician for other access device   [facility] pharmacist to adjust dose for: Vancomycin, Aminoglycosides and other anti-infective therapies as requested by physician   Method of administration may be changed at the discretion of home infusion pharmacist based upon assessment of the patient and/or caregiver's ability to self-administer the medication ordered   Complete by: As directed        Discharge Medications: Allergies as of 06/24/2019   No Known Allergies      Medication List     STOP taking these medications    citalopram 40 MG tablet Commonly known as: CELEXA       TAKE these medications    atorvastatin 10 MG tablet Commonly known as: LIPITOR Take 10 mg by mouth at bedtime.   ceFAZolin  IVPB Commonly known as: ANCEF Inject 2 g into the vein every 8 (eight) hours. Indication: Septic arthritis First Dose: No Last Day of Therapy:  07/19/19 Labs - Once weekly:  CBC/D and BMP, Labs - Every other week:  ESR and CRP Method of administration: IV Push Method of administration may be changed at the discretion of home infusion pharmacist based upon assessment of the patient and/or caregiver's ability to self-administer the medication ordered. What changed: additional instructions   cloNIDine 0.2 MG tablet Commonly known as: CATAPRES Take 1 tablet (0.2 mg total) by mouth 3 (three) times daily.   cyclobenzaprine 10 MG tablet Commonly known as: FLEXERIL Take 1 tablet (10 mg total) by mouth 3 (three) times daily as needed for muscle spasms.   esomeprazole 40 MG capsule Commonly known  as: NEXIUM Take 40 mg by mouth daily.   lisinopril 10 MG tablet Commonly known as: ZESTRIL Take 10 mg by mouth daily.   lithium carbonate 450 MG CR tablet Commonly known as: ESKALITH Total of 750 mg daily (400 mg + 300 mg) What changed:  how much to take how to take this when to take this additional instructions Another medication with the same name was removed. Continue taking this medication, and follow the directions you see here.   multivitamin with minerals Tabs tablet Take 1 tablet by mouth daily.   risperidone 4 MG tablet Commonly known as: RISPERDAL Take 1 tablet (4 mg total) by mouth at bedtime.   sertraline 50 MG tablet Commonly known as: Zoloft 25 mg daily for one week, then 50 mg daily What changed:  how much to take how to take this when to take this additional instructions   traMADol 50 MG tablet Commonly known as: ULTRAM Take 1 tablet (50 mg total) by mouth every 6 (six) hours as needed (mild pain).   traZODone 150 MG tablet Commonly known as: DESYREL Take 1 tablet (150 mg total) by mouth at bedtime.               Discharge Care Instructions  (From admission, onward)  Start     Ordered   06/24/19 0000  Change dressing on IV access line weekly and PRN  (Home infusion instructions - [facility] )     06/24/19 0732            Follow Up Appointments: Follow-up Information     Prescott Gum, Collier Salina, MD. Go on 06/30/2019.   Specialty: Cardiothoracic Surgery Why: Appointment time is at 3:00 pm. Please bring wound VAC sponge/supplies Contact information: [place] [address] [phone]         Thayer Headings, MD. Go on 07/07/2019.   Specialty: Infectious Diseases Why: Appointment time is at 11:00 am. Contact information: [address] [place] [address] [phone]            Signed: Sharalyn Ink [facility] 06/24/2019, 7:36 AM  DC instructions reviewed  with patient

## 2019-06-23 NOTE — Progress Notes (Signed)
Pre Procedure note for inpatients:   Herbert Walsh has been scheduled for Procedure(s): irrigation and clean out right shoulder (Right) APPLICATION OF WOUND VAC (Right) today. The various methods of treatment have been discussed with the patient. After consideration of the risks, benefits and treatment options the patient has consented to the planned procedure.   The patient has been seen and labs reviewed. There are no changes in the patient's condition to prevent proceeding with the planned procedure today.  Recent labs:  Lab Results  Component Value Date   WBC 7.6 06/23/2019   HGB 8.4 (L) 06/23/2019   HCT 28.6 (L) 06/23/2019   PLT 483 (H) 06/23/2019   GLUCOSE 103 (H) 06/23/2019   CHOL 189 07/07/2016   TRIG 158 (H) 07/07/2016   HDL 37 (L) 07/07/2016   LDLCALC 120 (H) 07/07/2016   ALT 26 06/20/2019   AST 23 06/20/2019   NA 140 06/23/2019   K 4.1 06/23/2019   CL 103 06/23/2019   CREATININE 0.82 06/23/2019   BUN 12 06/23/2019   CO2 28 06/23/2019   TSH 1.76 08/06/2018   INR 1.1 06/20/2019   HGBA1C 6.2 (H) 05/31/2019    [name], MD 06/23/2019 7:31 AM

## 2019-06-23 NOTE — Anesthesia Procedure Notes (Signed)
Procedure Name: Intubation Date/Time: 06/23/2019 7:46 AM Performed by: Neldon Newport, CRNA Pre-anesthesia Checklist: Timeout performed, Patient being monitored, Suction available, Emergency Drugs available and Patient identified Patient Re-evaluated:Patient Re-evaluated prior to induction Oxygen Delivery Method: Circle system utilized Preoxygenation: Pre-oxygenation with 100% oxygen Induction Type: IV induction Ventilation: Mask ventilation without difficulty and Oral airway inserted - appropriate to patient size Laryngoscope Size: Mac and 4 Grade View: Grade II Tube type: Oral Number of attempts: 1 Placement Confirmation: positive ETCO2 and breath sounds checked- equal and bilateral Secured at: 23 cm Tube secured with: Tape Dental Injury: Teeth and Oropharynx as per pre-operative assessment

## 2019-06-24 ENCOUNTER — Encounter: Payer: Self-pay | Admitting: *Deleted

## 2019-06-24 ENCOUNTER — Inpatient Hospital Stay: Payer: BC Managed Care – PPO | Admitting: Internal Medicine

## 2019-06-24 LAB — TYPE AND SCREEN
ABO/RH(D): O POS
Antibody Screen: NEGATIVE
Unit division: 0
Unit division: 0

## 2019-06-24 LAB — BPAM RBC
Blood Product Expiration Date: 202106032359
Blood Product Expiration Date: 202106032359
ISSUE DATE / TIME: 202105070853
ISSUE DATE / TIME: 202105070853
Unit Type and Rh: 5100
Unit Type and Rh: 5100

## 2019-06-24 LAB — BASIC METABOLIC PANEL
Anion gap: 9 (ref 5–15)
BUN: 14 mg/dL (ref 6–20)
CO2: 27 mmol/L (ref 22–32)
Calcium: 9 mg/dL (ref 8.9–10.3)
Chloride: 101 mmol/L (ref 98–111)
Creatinine, Ser: 0.79 mg/dL (ref 0.61–1.24)
GFR calc Af Amer: >60 mL/min (ref >60)
GFR calc non Af Amer: >60 mL/min (ref >60)
Glucose, Bld: 123 mg/dL — ABNORMAL HIGH (ref 70–99)
Potassium: 4.2 mmol/L (ref 3.5–5.1)
Sodium: 137 mmol/L (ref 135–145)

## 2019-06-24 LAB — CBC
HCT: 28 % — ABNORMAL LOW (ref 39.0–52.0)
Hemoglobin: 8.6 g/dL — ABNORMAL LOW (ref 13.0–17.0)
MCH: 24.3 pg — ABNORMAL LOW (ref 26.0–34.0)
MCHC: 30.7 g/dL (ref 30.0–36.0)
MCV: 79.1 fL — ABNORMAL LOW (ref 80.0–100.0)
Platelets: 530 10*3/uL — ABNORMAL HIGH (ref 150–400)
RBC: 3.54 MIL/uL — ABNORMAL LOW (ref 4.22–5.81)
RDW: 15.9 % — ABNORMAL HIGH (ref 11.5–15.5)
WBC: 11.8 10*3/uL — ABNORMAL HIGH (ref 4.0–10.5)
nRBC: 0.2 % (ref 0.0–0.2)

## 2019-06-24 MED ORDER — OXYCODONE-ACETAMINOPHEN 5-325 MG PO TABS: 1.0000 | ORAL_TABLET | ORAL | 0 refills | Status: AC | PRN

## 2019-06-24 MED ORDER — CEFAZOLIN IV (FOR PTA / DISCHARGE USE ONLY): 2.0000 g | Freq: Three times a day (TID) | INTRAVENOUS | 0 refills | Status: DC

## 2019-06-24 MED ORDER — TRAMADOL HCL 50 MG PO TABS: 50.0000 mg | ORAL_TABLET | Freq: Four times a day (QID) | ORAL | 0 refills | Status: DC | PRN

## 2019-06-24 NOTE — Plan of Care (Signed)
  Problem: Education: Goal: Knowledge of General Education information will improve Description: Including pain rating scale, medication(s)/side effects and non-pharmacologic comfort measures Outcome: Progressing   Problem: Clinical Measurements: Goal: Ability to maintain clinical measurements within normal limits will improve Outcome: Progressing Goal: Diagnostic test results will improve Outcome: Progressing Goal: Respiratory complications will improve Outcome: Progressing Goal: Cardiovascular complication will be avoided Outcome: Progressing   Problem: Activity: Goal: Risk for activity intolerance will decrease Outcome: Progressing   Problem: Nutrition: Goal: Adequate nutrition will be maintained Outcome: Progressing   

## 2019-06-24 NOTE — Progress Notes (Addendum)
      [address]       [place] [address]             [phone]       1 Day Post-Op Procedure(s) (LRB): irrigation and clean out right shoulder (Right) APPLICATION OF WOUND VAC (Right)  Subjective: Patient sleeping and awakened. He has no specific complaint this am.  Objective: Vital signs in last 24 hours: Temp:  [97.8 F (36.6 C)-98.2 F (36.8 C)] 98.2 F (36.8 C) (05/11 0407) Pulse Rate:  [77-105] 77 (05/11 0407) Cardiac Rhythm: Normal sinus rhythm (05/11 0300) Resp:  [10-16] 16 (05/11 0622) BP: (105-116)/(59-75) 105/59 (05/11 0407) SpO2:  [93 %-99 %] 99 % (05/11 0407)     Intake/Output from previous day: 05/10 0701 - 05/11 0700 In: 1255 [P.O.:240; I.V.:915; IV Piggyback:100] Out: 500 [Urine:400; Drains:50; Blood:50]   Physical Exam:  Cardiovascular: RRR Pulmonary: Clear to auscultation bilaterally Abdomen: Soft, non tender, bowel sounds present. Extremities: No lower extremity edema. Wounds: VAC in place over right [place] joint   Lab Results: CBC: Recent Labs    06/23/19 0541 06/24/19 0220  WBC 7.6 11.8*  HGB 8.4* 8.6*  HCT 28.6* 28.0*  PLT 483* 530*   BMET:  Recent Labs    06/23/19 0541 06/24/19 0220  NA 140 137  K 4.1 4.2  CL 103 101  CO2 28 27  GLUCOSE 103* 123*  BUN 12 14  CREATININE 0.82 0.79  CALCIUM 9.0 9.0    PT/INR: No results for input(s): LABPROT, INR in the last 72 hours. ABG:  INR: Will add last result for INR, ABG once components are confirmed Will add last 4 CBG results once components are confirmed  Assessment/Plan:  1. CV - SR in the 70's. 2.  Pulmonary - On room air. 3.  ID-continue Cefazolin for MSSA bactermia 4. Anemia-H and H this am stable at 8.6 and 28. 5. Discharge with [facility], wound VAC  Donielle M ZimmermanPA-C 06/24/2019,7:22 AM [phone]  Patient stable with wound vac and iv Ancef thru PICC Ready for transition to outpatient care Instructions reviewed with patient  patient  examined and medical record reviewed,agree with above note. Kathlee Nations Trigt III 06/24/2019

## 2019-06-24 NOTE — TOC Transition Note (Signed)
Transition of Care [facility]) - CM/SW Discharge Note   Patient Details  Name: Herbert Walsh MRN: [id number] Date of Birth: [birth year]  [facility]) CM/SW Contact:  Leone Haven, RN Phone Number: 06/24/2019, 4:33 PM   Clinical Narrative:    Patient is for dc today, Jeri Modena with [employer] is aware and has supplied the medication, Anastasia Fiedler is notified also.  Patient was with [facility] prior to admission and will continue with same medications.  Patient will be going home with wound vac which will be changed in the office.  [employer] will be supplying the wound vac, the courier will be delivering the vac today.     Final next level of care: Home w Home Health Services Barriers to Discharge: No Barriers Identified   Patient Goals and CMS Choice        Discharge Placement                       Discharge Plan and Services                DME Arranged: Vac DME Agency: [employer], [facility] [employer] Date DME Agency Contacted: 06/23/19 Time DME Agency Contacted: 1200 Representative spoke with at DME Agency: French Ana HH Arranged: RN HH Agency: ([facility]) Date HH Agency Contacted: 06/23/19 Time HH Agency Contacted: 0120 Representative spoke with at [facility] Agency: Pam  Social Determinants of Health (SDOH) Interventions     Readmission Risk Interventions No flowsheet data found.

## 2019-06-24 NOTE — Plan of Care (Signed)

## 2019-06-24 NOTE — TOC Progression Note (Addendum)
Transition of Care [facility]) - Progression Note    Patient Details  Name: Herbert Walsh MRN: [id number] Date of Birth: Dec 31, 1990  Transition [facility] Care [facility]) CM/SW Contact  Leone Haven, RN Phone Number: 06/24/2019, 11:48 AM  Clinical Narrative:    NCM spoke with French Ana with [employer], she states the forms where sent to [facility] but they were not signed.  NCM informed her to send them to Jillyn Hidden today so that he can sign the forms and get them back to her for the wound vac.   5/11 16:27- Per French Ana with [employer], Service is setting up the courier to bring the wound vac to the hospital.  Not sure of what time they will be here .  Patient can dc home once vac has arrived and applied to patient. Romeo Apple Staff RN aware.      Expected Discharge Plan and Services           Expected Discharge Date: 06/24/19                                     Social Determinants of Health (SDOH) Interventions    Readmission Risk Interventions No flowsheet data found.

## 2019-06-25 LAB — AEROBIC/ANAEROBIC CULTURE W GRAM STAIN (SURGICAL/DEEP WOUND)

## 2019-06-30 ENCOUNTER — Ambulatory Visit: Payer: Self-pay | Admitting: Cardiothoracic Surgery

## 2019-07-01 ENCOUNTER — Other Ambulatory Visit: Payer: Self-pay | Admitting: Cardiothoracic Surgery

## 2019-07-01 DIAGNOSIS — S4360XD Sprain of unspecified sternoclavicular joint, subsequent encounter: Secondary | ICD-10-CM
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
## 2019-07-02 ENCOUNTER — Other Ambulatory Visit: Payer: Self-pay

## 2019-07-02 ENCOUNTER — Ambulatory Visit: Payer: BC Managed Care – PPO | Admitting: Cardiothoracic Surgery

## 2019-07-02 ENCOUNTER — Encounter: Payer: Self-pay | Admitting: Cardiothoracic Surgery

## 2019-07-02 ENCOUNTER — Ambulatory Visit
Admission: RE | Admit: 2019-07-02 | Discharge: 2019-07-02 | Disposition: A | Discharge status: Home / Self Care | Payer: BC Managed Care – PPO | Source: Ambulatory Visit | Attending: Cardiothoracic Surgery | Admitting: Cardiothoracic Surgery

## 2019-07-02 VITALS — BP 111/72 | HR 130 | Temp 97.5°F | Resp 16 | Ht 72.0 in | Wt 275.0 lb

## 2019-07-02 DIAGNOSIS — M869 Osteomyelitis, unspecified: Secondary | ICD-10-CM

## 2019-07-02 DIAGNOSIS — S4360XD Sprain of unspecified sternoclavicular joint, subsequent encounter: Secondary | ICD-10-CM

## 2019-07-02 DIAGNOSIS — L02213 Cutaneous abscess of chest wall: Secondary | ICD-10-CM | POA: Insufficient documentation

## 2019-07-02 DIAGNOSIS — Z09 Encounter for follow-up examination after completed treatment for conditions other than malignant neoplasm: Secondary | ICD-10-CM

## 2019-07-02 IMAGING — CR DG CHEST 2V
2 series · 2 of 2 positions shown · non-contrast
Comparison: Single-view of the chest [DATE].

CLINICAL DATA: Patient status post irrigation a right
sternoclavicular joint and chest wall wound [DATE].

EXAM:
CHEST - 2 VIEW

[w chest pa]
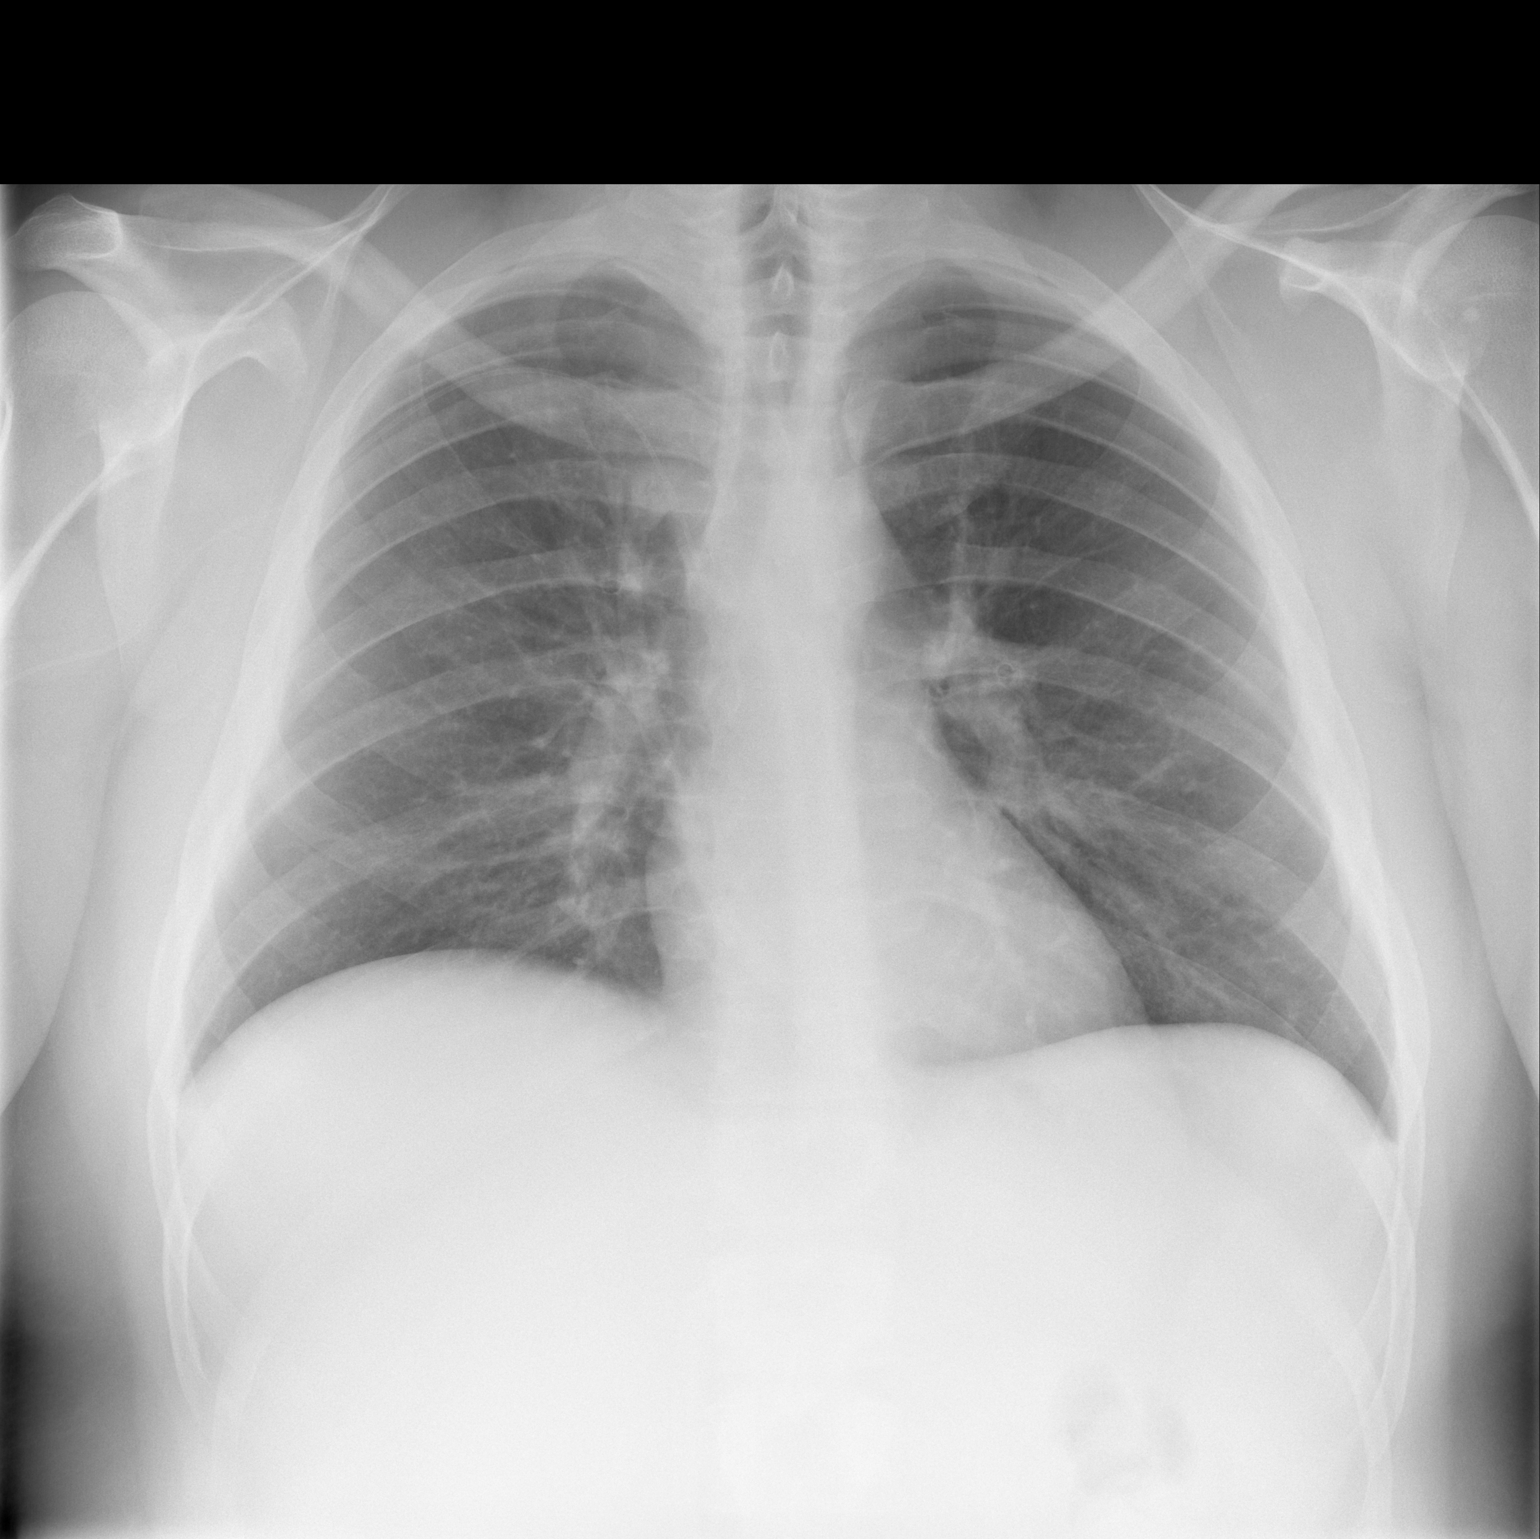

[w chest lat]
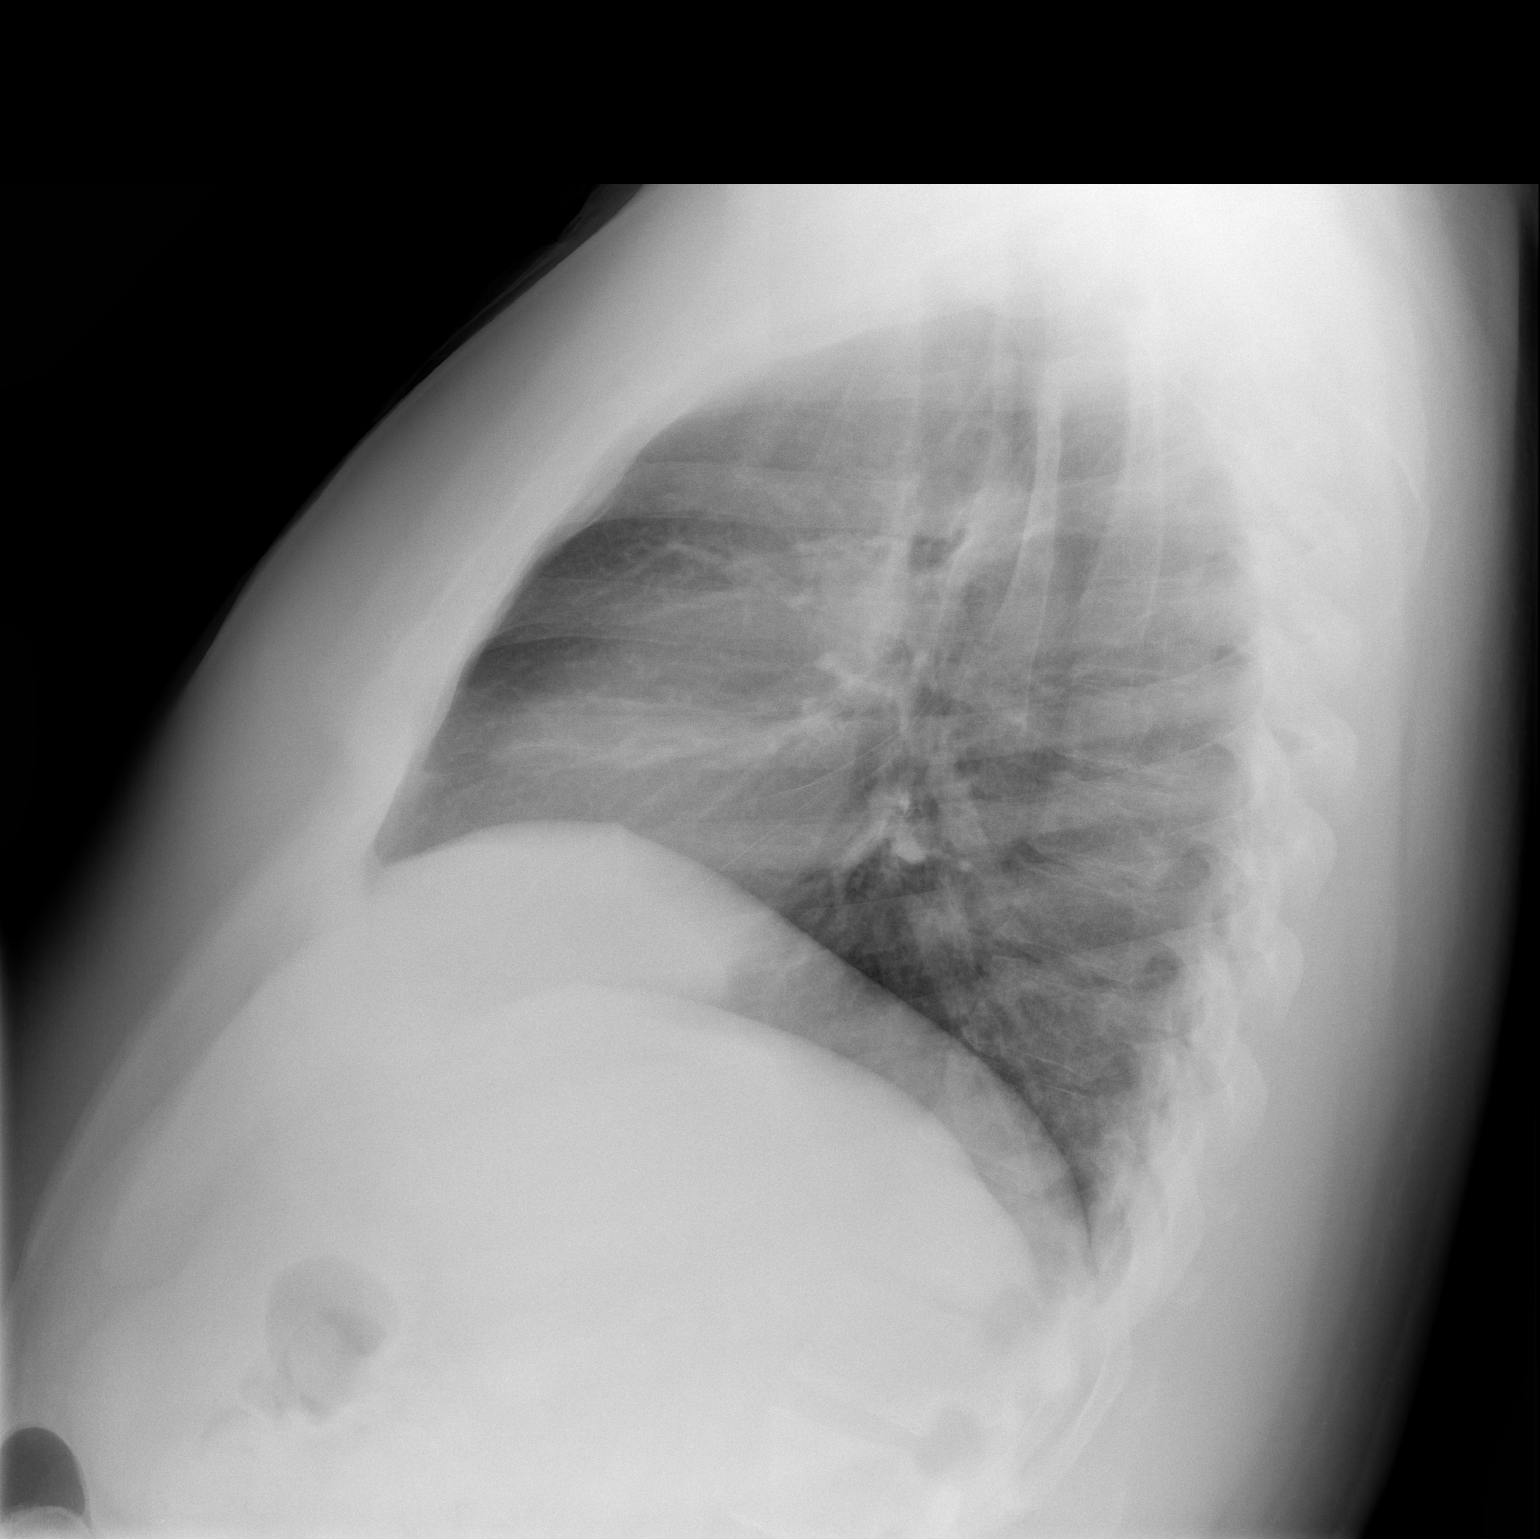

[2 of 2 positions shown; findings below may reference images not displayed]

FINDINGS: Right PICC is in place, unchanged. Lungs are clear. Heart size is
normal. No pneumothorax or pleural effusion. No acute or focal bony
abnormality.
IMPRESSION: No acute disease.

## 2019-07-02 NOTE — Progress Notes (Signed)
PCP is Glenda Chroman, MD Referring Provider is Madalyn Rob, MD  Chief Complaint  Patient presents with  . Routine Post Op    s/p I&D and wound vac application to R sternoclavicular joint abscess on 06/23/19    HPI: Postop visit for wound VAC change.  Patient has history of MSSA infection of his right sternoclavicular joint. He is status post debridement and hospitalization.  He is on IV Ancef through a PICC line and followed by home health nursing. His wound VAC has been in place for 8 days.  The suction has been maintained.  I did the wound VAC change personally today.  The main wound bed has filled in with the ACell product. There is a space-tunnel into the right supraclavicular chest wall.  Both the main wound bed and the tunnel were filled with activated collagen gel-ccelerate  He denies fever.  His pain is improved.  He is smoking minimally.  Past Medical History:  Diagnosis Date  . Anemia   . Anxiety   . Arthritis    rheumatoid  . Bipolar disorder ([place])   . Depression   . GERD (gastroesophageal reflux disease)   . Hypertension 2019  . Ischemic colitis ([place]) 09/2008  . Schizophrenia ([place])   . Tachycardia     Past Surgical History:  Procedure Laterality Date  . APPENDECTOMY    . APPLICATION OF WOUND VAC Right 06/20/2019   Procedure: APPLICATION OF WOUND VAC;  Surgeon: Ivin Poot, MD;  Location: [place];  Service: Vascular;  Laterality: Right;  . APPLICATION OF WOUND VAC Right 06/23/2019   Procedure: APPLICATION OF WOUND VAC;  Surgeon: Ivin Poot, MD;  Location: [place];  Service: Thoracic;  Laterality: Right;  . I & D EXTREMITY Right 05/31/2019   Procedure: IRRIGATION AND DEBRIDEMENT LOWER EXTREMITY;  Surgeon: Leandrew Koyanagi, MD;  Location: [place];  Service: Orthopedics;  Laterality: Right;  . I & D EXTREMITY Right 06/20/2019   Procedure: DEBRIDEMENT OF STERNOCLAVICULAR JOINT ABSCESS;  Surgeon: Ivin Poot, MD;  Location: [place];  Service: Vascular;  Laterality:  Right;  . TEE WITHOUT CARDIOVERSION N/A 06/05/2019   Procedure: TRANSESOPHAGEAL ECHOCARDIOGRAM (TEE);  Surgeon: Lelon Perla, MD;  Location: [place];  Service: Cardiovascular;  Laterality: N/A;  . WOUND EXPLORATION Right 06/23/2019   Procedure: irrigation and clean out right shoulder;  Surgeon: Ivin Poot, MD;  Location[facility];  Service: Thoracic;  Laterality: Right;    Family History  Problem Relation Age of Onset  . Alcohol abuse Mother   . Depression Mother   . Drug abuse Mother   . Physical abuse Mother   . Sexual abuse Mother   . ADD / ADHD Paternal Aunt   . Alcohol abuse Maternal Grandfather   . Bipolar disorder Maternal Grandmother   . Schizophrenia Maternal Grandmother   . Drug abuse Maternal Grandmother   . Alcohol abuse Paternal Grandfather   . Depression Paternal Grandfather   . Depression Paternal Grandmother   . Drug abuse Paternal Grandmother     Social History Social History   Tobacco Use  . Smoking status: Current Every Day Smoker    Packs/day: 0.50    Years: 11.00    Pack years: 5.50    Types: Cigarettes  . Smokeless tobacco: Never Used  Substance Use Topics  . Alcohol use: No    Comment: one beer every 2-3 months  . Drug use: No    Current Outpatient Medications  Medication Sig  Dispense Refill  . atorvastatin (LIPITOR) 10 MG tablet Take 10 mg by mouth at bedtime.   12  . ceFAZolin (ANCEF) IVPB Inject 2 g into the vein every 8 (eight) hours. Indication: Septic arthritis First Dose: No Last Day of Therapy:  07/19/19 Labs - Once weekly:  CBC/D and BMP, Labs - Every other week:  ESR and CRP Method of administration: IV Push Method of administration may be changed at the discretion of home infusion pharmacist based upon assessment of the patient and/or caregiver's ability to self-administer the medication ordered. 81 Units 0  . cloNIDine (CATAPRES) 0.2 MG tablet Take 1 tablet (0.2 mg total) by mouth 3 (three) times daily. 270 tablet 0  .  cyclobenzaprine (FLEXERIL) 10 MG tablet Take 1 tablet (10 mg total) by mouth 3 (three) times daily as needed for muscle spasms. 30 tablet 0  . esomeprazole (NEXIUM) 40 MG capsule Take 40 mg by mouth daily.    [name] lisinopril (PRINIVIL,ZESTRIL) 10 MG tablet Take 10 mg by mouth daily.    [name] lithium carbonate (ESKALITH) 450 MG CR tablet Total of 750 mg daily (400 mg + 300 mg) (Patient taking differently: Take 450 mg by mouth at bedtime. Take with 300 mg to equal 750 mg at bedtime) 90 tablet 0  . Multiple Vitamin (MULTIVITAMIN WITH MINERALS) TABS tablet Take 1 tablet by mouth daily.    [name] oxyCODONE-acetaminophen (PERCOCET/ROXICET) 5-325 MG tablet Take by mouth every 8 (eight) hours as needed for severe pain.    [name] risperidone (RISPERDAL) 4 MG tablet Take 1 tablet (4 mg total) by mouth at bedtime. 90 tablet 0  . sertraline (ZOLOFT) 50 MG tablet 25 mg daily for one week, then 50 mg daily (Patient taking differently: Take 50 mg by mouth daily. ) 30 tablet 1  . traZODone (DESYREL) 150 MG tablet Take 1 tablet (150 mg total) by mouth at bedtime. 90 tablet 0   No current facility-administered medications for this visit.    No Known Allergies  Review of Systems  Taking multivitamins Healthy Minimize smoking No fever Weight stable No chest pain  BP 111/72 (BP Location: Left Arm, Patient Position: Sitting, Cuff Size: Normal)   Pulse (!) 130   Temp (!) 97.5 F (36.4 C)   Resp 16   Ht 6' (1.829 m)   Wt 275 lb (124.7 kg)   SpO2 93% Comment: RA  BMI 37.30 kg/m  Physical Exam      Exam    General- alert and comfortable    Neck- no JVD, no cervical adenopathy palpable, no carotid bruit   Lungs- clear without rales, wheezes   Cor- regular rate and rhythm, no murmur , gallop   Abdomen- soft, non-tender   Extremities - warm, non-tender, minimal edema   Neuro- oriented, appropriate, no focal weakness  Diagnostic Tests: Today's chest x-ray image personally viewed showing clear lungs no pleural effusion  no pneumothorax PICC line in good position  Impression: Abscess of right sternoclavicular joint-chest wall Healing and after excisional sharp debridement and wound VAC with ACell product previously applied  Plan: Plan another week of wound VAC therapy with IV antibiotics. Return to clinic in a week for examination of the wound.  Probably will not need wound VAC after another week.  Len Childs, MD [facility] [phone]

## 2019-07-07 ENCOUNTER — Ambulatory Visit: Payer: BC Managed Care – PPO | Admitting: Internal Medicine

## 2019-07-07 ENCOUNTER — Encounter (HOSPITAL_COMMUNITY): Payer: Self-pay | Admitting: Psychiatry

## 2019-07-07 ENCOUNTER — Telehealth (INDEPENDENT_AMBULATORY_CARE_PROVIDER_SITE_OTHER): Payer: BC Managed Care – PPO | Admitting: Psychiatry

## 2019-07-07 ENCOUNTER — Other Ambulatory Visit (HOSPITAL_COMMUNITY): Payer: Self-pay | Admitting: Internal Medicine

## 2019-07-07 ENCOUNTER — Other Ambulatory Visit: Payer: Self-pay

## 2019-07-07 ENCOUNTER — Encounter: Payer: Self-pay | Admitting: Internal Medicine

## 2019-07-07 ENCOUNTER — Telehealth: Payer: Self-pay | Admitting: *Deleted

## 2019-07-07 VITALS — BP 121/77 | HR 107 | Temp 98.0°F | Wt 273.0 lb

## 2019-07-07 DIAGNOSIS — M00061 Staphylococcal arthritis, right knee: Secondary | ICD-10-CM

## 2019-07-07 DIAGNOSIS — F6381 Intermittent explosive disorder: Secondary | ICD-10-CM | POA: Diagnosis not present

## 2019-07-07 DIAGNOSIS — F4310 Post-traumatic stress disorder, unspecified: Secondary | ICD-10-CM

## 2019-07-07 DIAGNOSIS — M86011 Acute hematogenous osteomyelitis, right shoulder: Secondary | ICD-10-CM | POA: Diagnosis not present

## 2019-07-07 DIAGNOSIS — M009 Pyogenic arthritis, unspecified: Secondary | ICD-10-CM

## 2019-07-07 DIAGNOSIS — Z452 Encounter for adjustment and management of vascular access device: Secondary | ICD-10-CM

## 2019-07-07 DIAGNOSIS — M869 Osteomyelitis, unspecified: Secondary | ICD-10-CM | POA: Diagnosis not present

## 2019-07-07 MED ORDER — SERTRALINE HCL 100 MG PO TABS
100.0000 mg | ORAL_TABLET | Freq: Every day | ORAL | 0 refills | Status: DC
Start: 2019-07-07 — End: 2019-09-25

## 2019-07-07 MED ORDER — CEPHALEXIN 500 MG PO CAPS: 500.0000 mg | ORAL_CAPSULE | Freq: Four times a day (QID) | ORAL | 0 refills | Status: DC

## 2019-07-07 NOTE — Addendum Note (Signed)
Addended by: Neysa Hotter on: 07/07/2019 03:05 PM   Modules accepted: Orders

## 2019-07-07 NOTE — Telephone Encounter (Signed)
Called [facility]--verbal order PICC-line out last dose antibiotic 07-19-19 by Dr. Luciana Axe.

## 2019-07-07 NOTE — Telephone Encounter (Deleted)
Called [facility]--verbal

## 2019-07-07 NOTE — Patient Instructions (Signed)
1. Increase sertraline 100 mg daily  2.Continuerisperidone4mg  at night 3.Continuelithium750mg  at night 4.Continueclonidine 0.2 mg three times a day 6. Continue Trazodone 150 mg daily  7.Next appointment: 7/1 at 1:30  8. Check lab (TSH/thyroid test)

## 2019-07-08 ENCOUNTER — Encounter: Payer: Self-pay | Admitting: Internal Medicine

## 2019-07-08 DIAGNOSIS — Z452 Encounter for adjustment and management of vascular access device: Secondary | ICD-10-CM | POA: Insufficient documentation

## 2019-07-08 NOTE — Assessment & Plan Note (Signed)
This is working well.  Did require TPA x 1 but working good now.  This can be removed at the end of treatment on 6/5.

## 2019-07-08 NOTE — Assessment & Plan Note (Signed)
Now s/p debridement and culture also with MSSA.  Continuing with antibiotics in the IV and will transition to oral Keflex after that due to the elevated inflammatory markers.   Will also get updated ESR and CRP.  Will follow up after IV treatment completion.  

## 2019-07-08 NOTE — Progress Notes (Signed)
   Subjective:    Patient ID: Herbert Walsh, male    DOB: [birth year], 29 y.o.   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  His knee was debrided and initially managed without surgery for his sternal area but ultimately did require debridement done by Dr. [name] on 5/7.  Culture did again grow MSSA and he is continuing on cefazolin for 4 weeks from surgery through 6/5.  He has a VAC in place. No associated rash or diarrhea.    Review of Systems  Constitutional: Negative for chills and fever.  Gastrointestinal: Negative for diarrhea and nausea.  Skin: Negative for rash.       Objective:   Physical Exam Constitutional:      Appearance: Normal appearance.  Eyes:     General: No scleral icterus. Cardiovascular:     Rate and Rhythm: Normal rate and regular rhythm.  Pulmonary:     Effort: Pulmonary effort is normal.  Neurological:     Mental Status: He is alert.  Psychiatric:        Mood and Affect: Mood normal.   SH: + tobacco        Assessment & Plan:

## 2019-07-08 NOTE — Assessment & Plan Note (Signed)
This has healed, no further issues at this time.

## 2019-07-09 ENCOUNTER — Other Ambulatory Visit: Payer: Self-pay

## 2019-07-09 ENCOUNTER — Encounter: Payer: Self-pay | Admitting: Cardiothoracic Surgery

## 2019-07-09 ENCOUNTER — Ambulatory Visit (INDEPENDENT_AMBULATORY_CARE_PROVIDER_SITE_OTHER): Payer: Self-pay | Admitting: Cardiothoracic Surgery

## 2019-07-09 VITALS — BP 109/78 | HR 121 | Temp 97.7°F | Resp 16 | Ht 72.0 in | Wt 273.0 lb

## 2019-07-09 DIAGNOSIS — S21101D Unspecified open wound of right front wall of thorax without penetration into thoracic cavity, subsequent encounter: Secondary | ICD-10-CM

## 2019-07-09 DIAGNOSIS — M009 Pyogenic arthritis, unspecified: Secondary | ICD-10-CM

## 2019-07-09 DIAGNOSIS — A4901 Methicillin susceptible Staphylococcus aureus infection, unspecified site: Secondary | ICD-10-CM

## 2019-07-09 DIAGNOSIS — S21109A Unspecified open wound of unspecified front wall of thorax without penetration into thoracic cavity, initial encounter: Secondary | ICD-10-CM | POA: Insufficient documentation

## 2019-07-09 DIAGNOSIS — Z09 Encounter for follow-up examination after completed treatment for conditions other than malignant neoplasm: Secondary | ICD-10-CM

## 2019-07-09 NOTE — Progress Notes (Signed)
PCP is Glenda Chroman, MD Referring Provider is Glenda Chroman, MD  Chief Complaint  Patient presents with  . Routine Post Op    s/p I and D of R [place] JOINT...here for repacking    HPI: The patient returns for a wound check He has a wound VAC and home IV antibiotics for MSSA infection of right sternoclavicular joint  I personally change the wound VAC and examined the wound.  It is granulating in.  It was not necessary to reapply the wound VAC. After application of activated human collagen into the wound I placed a moist loose wet-to-dry 4 x 4 gauze covered with a Tegaderm.  The patient will pack the wound daily.  IV antibiotics will continue. Past Medical History:  Diagnosis Date  . Anemia   . Arthritis    rheumatoid  . Depression   . GERD (gastroesophageal reflux disease)   . Hypertension 2019  . Ischemic colitis ([place]) 09/2008  . Tachycardia     Past Surgical History:  Procedure Laterality Date  . APPENDECTOMY    . APPLICATION OF WOUND VAC Right 06/20/2019   Procedure: APPLICATION OF WOUND VAC;  Surgeon: Ivin Poot, MD;  Location: [place];  Service: Vascular;  Laterality: Right;  . APPLICATION OF WOUND VAC Right 06/23/2019   Procedure: APPLICATION OF WOUND VAC;  Surgeon: Ivin Poot, MD;  Location: [place];  Service: Thoracic;  Laterality: Right;  . I & D EXTREMITY Right 05/31/2019   Procedure: IRRIGATION AND DEBRIDEMENT LOWER EXTREMITY;  Surgeon: Leandrew Koyanagi, MD;  Location: [place];  Service: Orthopedics;  Laterality: Right;  . I & D EXTREMITY Right 06/20/2019   Procedure: DEBRIDEMENT OF STERNOCLAVICULAR JOINT ABSCESS;  Surgeon: Ivin Poot, MD;  Location: [place];  Service: Vascular;  Laterality: Right;  . TEE WITHOUT CARDIOVERSION N/A 06/05/2019   Procedure: TRANSESOPHAGEAL ECHOCARDIOGRAM (TEE);  Surgeon: Lelon Perla, MD;  Location: [place];  Service: Cardiovascular;  Laterality: N/A;  . WOUND EXPLORATION Right 06/23/2019   Procedure: irrigation and clean out  right shoulder;  Surgeon: Ivin Poot, MD;  Location: [facility] OR;  Service: Thoracic;  Laterality: Right;    Family History  Problem Relation Age of Onset  . Alcohol abuse Mother   . Depression Mother   . Drug abuse Mother   . Physical abuse Mother   . Sexual abuse Mother   . ADD / ADHD Paternal Aunt   . Alcohol abuse Maternal Grandfather   . Bipolar disorder Maternal Grandmother   . Schizophrenia Maternal Grandmother   . Drug abuse Maternal Grandmother   . Alcohol abuse Paternal Grandfather   . Depression Paternal Grandfather   . Depression Paternal Grandmother   . Drug abuse Paternal Grandmother     Social History Social History   Tobacco Use  . Smoking status: Current Every Day Smoker    Packs/day: 0.50    Years: 11.00    Pack years: 5.50    Types: Cigarettes  . Smokeless tobacco: Never Used  Substance Use Topics  . Alcohol use: No    Comment: one beer every 2-3 months  . Drug use: No    Current Outpatient Medications  Medication Sig Dispense Refill  . atorvastatin (LIPITOR) 10 MG tablet Take 10 mg by mouth at bedtime.   12  . ceFAZolin (ANCEF) IVPB Inject 2 g into the vein every 8 (eight) hours. Indication: Septic arthritis First Dose: No Last Day of Therapy:  07/19/19 Labs - Once weekly:  CBC/D and BMP, Labs - Every other week:  ESR and CRP Method of administration: IV Push Method of administration may be changed at the discretion of home infusion pharmacist based upon assessment of the patient and/or caregiver's ability to self-administer the medication ordered. 81 Units 0  . [START ON 07/20/2019] cephALEXin (KEFLEX) 500 MG capsule Take 1 capsule (500 mg total) by mouth 4 (four) times daily. 120 capsule 0  . cloNIDine (CATAPRES) 0.2 MG tablet Take 1 tablet (0.2 mg total) by mouth 3 (three) times daily. 270 tablet 0  . cyclobenzaprine (FLEXERIL) 10 MG tablet Take 1 tablet (10 mg total) by mouth 3 (three) times daily as needed for muscle spasms. 30 tablet 0  .  esomeprazole (NEXIUM) 40 MG capsule Take 40 mg by mouth daily.    [name] lisinopril (PRINIVIL,ZESTRIL) 10 MG tablet Take 10 mg by mouth daily.    [name] lithium carbonate (ESKALITH) 450 MG CR tablet Total of 750 mg daily (400 mg + 300 mg) (Patient taking differently: Take 450 mg by mouth at bedtime. Take with 300 mg to equal 750 mg at bedtime) 90 tablet 0  . Multiple Vitamin (MULTIVITAMIN WITH MINERALS) TABS tablet Take 1 tablet by mouth daily.    [name] oxyCODONE-acetaminophen (PERCOCET/ROXICET) 5-325 MG tablet Take by mouth every 8 (eight) hours as needed for severe pain.    [name] risperidone (RISPERDAL) 4 MG tablet Take 1 tablet (4 mg total) by mouth at bedtime. 90 tablet 0  . sertraline (ZOLOFT) 100 MG tablet Take 1 tablet (100 mg total) by mouth daily. 90 tablet 0  . traZODone (DESYREL) 150 MG tablet Take 1 tablet (150 mg total) by mouth at bedtime. 90 tablet 0   No current facility-administered medications for this visit.    No Known Allergies  Review of Systems  Pain over the wound is much improved No fever  BP 109/78 (BP Location: Left Arm, Patient Position: Sitting, Cuff Size: Normal)   Pulse (!) 121   Temp 97.7 F (36.5 C)   Resp 16   Ht 6' (1.829 m)   Wt 273 lb (123.8 kg)   SpO2 98% Comment: RA  BMI 37.03 kg/m  Physical Exam      Exam    General- alert and comfortable    Neck- no JVD, no cervical adenopathy palpable, no carotid bruit   Lungs- clear without rales, wheezes   Cor- regular rate and rhythm, no murmur , gallop   Abdomen- soft, non-tender   Extremities - warm, non-tender, minimal edema   Neuro- oriented, appropriate, no focal weakness   Diagnostic Tests: Chest x-ray today reviewed and is clear  Impression: Right sternoclavicular joint wound healing in secondarily DC wound VAC therapy Start daily wet-to-dry gauze packing 10 you weekly wound checks in office with application of activated collagen  Plan: Return in 1 week for wound check Supplies for wound care have  been provided the patient  Len Childs, MD [facility] [phone]

## 2019-07-16 ENCOUNTER — Ambulatory Visit: Payer: BC Managed Care – PPO | Admitting: Cardiothoracic Surgery

## 2019-07-17 ENCOUNTER — Encounter: Payer: Self-pay | Admitting: Cardiothoracic Surgery

## 2019-07-17 ENCOUNTER — Ambulatory Visit (INDEPENDENT_AMBULATORY_CARE_PROVIDER_SITE_OTHER): Payer: BC Managed Care – PPO | Admitting: Cardiothoracic Surgery

## 2019-07-17 ENCOUNTER — Other Ambulatory Visit: Payer: Self-pay

## 2019-07-17 VITALS — BP 132/88 | HR 120 | Temp 98.1°F | Resp 20 | Ht 72.0 in | Wt 273.0 lb

## 2019-07-17 DIAGNOSIS — Z4889 Encounter for other specified surgical aftercare: Secondary | ICD-10-CM

## 2019-07-17 DIAGNOSIS — Z09 Encounter for follow-up examination after completed treatment for conditions other than malignant neoplasm: Secondary | ICD-10-CM | POA: Diagnosis not present

## 2019-07-17 DIAGNOSIS — A4901 Methicillin susceptible Staphylococcus aureus infection, unspecified site: Secondary | ICD-10-CM | POA: Diagnosis not present

## 2019-07-17 DIAGNOSIS — L089 Local infection of the skin and subcutaneous tissue, unspecified: Secondary | ICD-10-CM | POA: Insufficient documentation

## 2019-07-17 DIAGNOSIS — M869 Osteomyelitis, unspecified: Secondary | ICD-10-CM | POA: Diagnosis not present

## 2019-07-17 NOTE — Progress Notes (Signed)
PCP is Glenda Chroman, MD Referring Provider is Madalyn Rob, MD  Chief Complaint  Patient presents with  . Routine Post Op    1 week f/u wound care     HPI: The patient returns for 1 week wound check.  He has right sternoclavicular joint MSSA wound infection previously treated with debridement and wound VAC.  The wound VAC was removed last week in the office and he is packing the wound daily with saline wet-to-dry.  His home health nurse agency is not helping with the wound care.  Today I personally inspected the wound and change the wound with repacking.  There is fairly good granulation tissue.  There is a deep track headed cephalad in the medial aspect of the wound and a more superficial tract under the subcutaneous layer heading cephalad in the lateral aspect of wound.  2 g of activated human collagen gel was applied to the depth of the wound followed by a moist 4 x 4 and then dry gauze and sterile dressings.   The patient will continue daily dressing changes.  He will transition from IV Ancef to oral Keflex over this coming weekend.  I will see him back in the office in 1 week.  Past Medical History:  Diagnosis Date  . Anemia   . Arthritis    rheumatoid  . Depression   . GERD (gastroesophageal reflux disease)   . Hypertension 2019  . Ischemic colitis ([place]) 09/2008  . Tachycardia     Past Surgical History:  Procedure Laterality Date  . APPENDECTOMY    . APPLICATION OF WOUND VAC Right 06/20/2019   Procedure: APPLICATION OF WOUND VAC;  Surgeon: Ivin Poot, MD;  Location: [place];  Service: Vascular;  Laterality: Right;  . APPLICATION OF WOUND VAC Right 06/23/2019   Procedure: APPLICATION OF WOUND VAC;  Surgeon: Ivin Poot, MD;  Location: [place];  Service: Thoracic;  Laterality: Right;  . I & D EXTREMITY Right 05/31/2019   Procedure: IRRIGATION AND DEBRIDEMENT LOWER EXTREMITY;  Surgeon: Leandrew Koyanagi, MD;  Location: [place];  Service: Orthopedics;  Laterality: Right;  . I &  D EXTREMITY Right 06/20/2019   Procedure: DEBRIDEMENT OF STERNOCLAVICULAR JOINT ABSCESS;  Surgeon: Ivin Poot, MD;  Location: [place];  Service: Vascular;  Laterality: Right;  . TEE WITHOUT CARDIOVERSION N/A 06/05/2019   Procedure: TRANSESOPHAGEAL ECHOCARDIOGRAM (TEE);  Surgeon: Lelon Perla, MD;  Location: [place];  Service: Cardiovascular;  Laterality: N/A;  . WOUND EXPLORATION Right 06/23/2019   Procedure: irrigation and clean out right shoulder;  Surgeon: Ivin Poot, MD;  Location: [facility] OR;  Service: Thoracic;  Laterality: Right;    Family History  Problem Relation Age of Onset  . Alcohol abuse Mother   . Depression Mother   . Drug abuse Mother   . Physical abuse Mother   . Sexual abuse Mother   . ADD / ADHD Paternal Aunt   . Alcohol abuse Maternal Grandfather   . Bipolar disorder Maternal Grandmother   . Schizophrenia Maternal Grandmother   . Drug abuse Maternal Grandmother   . Alcohol abuse Paternal Grandfather   . Depression Paternal Grandfather   . Depression Paternal Grandmother   . Drug abuse Paternal Grandmother     Social History Social History   Tobacco Use  . Smoking status: Current Every Day Smoker    Packs/day: 0.50    Years: 11.00    Pack years: 5.50    Types: Cigarettes  .  Smokeless tobacco: Never Used  Substance Use Topics  . Alcohol use: No    Comment: one beer every 2-3 months  . Drug use: No    Current Outpatient Medications  Medication Sig Dispense Refill  . atorvastatin (LIPITOR) 10 MG tablet Take 10 mg by mouth at bedtime.   12  . ceFAZolin (ANCEF) IVPB Inject 2 g into the vein every 8 (eight) hours. Indication: Septic arthritis First Dose: No Last Day of Therapy:  07/19/19 Labs - Once weekly:  CBC/D and BMP, Labs - Every other week:  ESR and CRP Method of administration: IV Push Method of administration may be changed at the discretion of home infusion pharmacist based upon assessment of the patient and/or caregiver's ability to  self-administer the medication ordered. 81 Units 0  . cloNIDine (CATAPRES) 0.2 MG tablet Take 1 tablet (0.2 mg total) by mouth 3 (three) times daily. 270 tablet 0  . cyclobenzaprine (FLEXERIL) 10 MG tablet Take 1 tablet (10 mg total) by mouth 3 (three) times daily as needed for muscle spasms. 30 tablet 0  . esomeprazole (NEXIUM) 40 MG capsule Take 40 mg by mouth daily.    [name] lisinopril (PRINIVIL,ZESTRIL) 10 MG tablet Take 10 mg by mouth daily.    [name] lithium carbonate (ESKALITH) 450 MG CR tablet Total of 750 mg daily (400 mg + 300 mg) (Patient taking differently: Take 450 mg by mouth at bedtime. Take with 300 mg to equal 750 mg at bedtime) 90 tablet 0  . Multiple Vitamin (MULTIVITAMIN WITH MINERALS) TABS tablet Take 1 tablet by mouth daily.    [name] oxyCODONE-acetaminophen (PERCOCET/ROXICET) 5-325 MG tablet Take by mouth every 8 (eight) hours as needed for severe pain.    [name] risperidone (RISPERDAL) 4 MG tablet Take 1 tablet (4 mg total) by mouth at bedtime. 90 tablet 0  . sertraline (ZOLOFT) 100 MG tablet Take 1 tablet (100 mg total) by mouth daily. 90 tablet 0  . traZODone (DESYREL) 150 MG tablet Take 1 tablet (150 mg total) by mouth at bedtime. 90 tablet 0  . [START ON 07/20/2019] cephALEXin (KEFLEX) 500 MG capsule Take 1 capsule (500 mg total) by mouth 4 (four) times daily. (Patient not taking: Reported on 07/17/2019) 120 capsule 0   No current facility-administered medications for this visit.    No Known Allergies  Review of Systems   No fevers Right shoulder pain still present but improving  BP 132/88   Pulse (!) 120   Temp 98.1 F (36.7 C)   Resp 20   Ht 6' (1.829 m)   Wt 273 lb (123.8 kg)   SpO2 98% Comment: RA  BMI 37.03 kg/m  Physical Exam      Exam    General- alert and comfortable.  Right sternoclavicular joint wound inspected and packed as described above.    Neck- no JVD, no cervical adenopathy palpable, no carotid bruit   Lungs- clear without rales, wheezes   Cor- regular  rate and rhythm, no murmur , gallop   Abdomen- soft, non-tender   Extremities - warm, non-tender, minimal edema   Neuro- oriented, appropriate, no focal weakness\  Diagnostic Tests: None  Impression: MSSA infection of the right sternoclavicular joint.  Wound is granulating in by secondary intent.  Continue daily dressing changes and antibiotics.  Plan: Return for wound check in 1 week in office.   Len Childs, MD [facility] [phone]

## 2019-07-22 LAB — FUNGUS CULTURE WITH STAIN

## 2019-07-22 LAB — FUNGUS CULTURE RESULT

## 2019-07-22 LAB — FUNGAL ORGANISM REFLEX

## 2019-07-23 ENCOUNTER — Encounter: Payer: Self-pay | Admitting: Cardiothoracic Surgery

## 2019-07-23 ENCOUNTER — Ambulatory Visit (INDEPENDENT_AMBULATORY_CARE_PROVIDER_SITE_OTHER): Payer: BC Managed Care – PPO | Admitting: Cardiothoracic Surgery

## 2019-07-23 ENCOUNTER — Other Ambulatory Visit: Payer: Self-pay

## 2019-07-23 VITALS — BP 134/84 | HR 120 | Temp 97.7°F | Resp 16 | Ht 72.0 in | Wt 273.0 lb

## 2019-07-23 DIAGNOSIS — A4901 Methicillin susceptible Staphylococcus aureus infection, unspecified site: Secondary | ICD-10-CM

## 2019-07-23 DIAGNOSIS — M869 Osteomyelitis, unspecified: Secondary | ICD-10-CM | POA: Diagnosis not present

## 2019-07-23 DIAGNOSIS — S21101D Unspecified open wound of right front wall of thorax without penetration into thoracic cavity, subsequent encounter: Secondary | ICD-10-CM | POA: Diagnosis not present

## 2019-07-23 DIAGNOSIS — M792 Neuralgia and neuritis, unspecified: Secondary | ICD-10-CM

## 2019-07-23 NOTE — Progress Notes (Signed)
PCP is Ignatius Specking, MD Referring Provider is Ignatius Specking, MD  Chief Complaint  Patient presents with  . Routine Post Op    1 wk f/u R [place] joint wound/dressing    HPI: Right sternoclavicular joint infection surgically debrided May 10. The patient returns for wound check. He was last seen a week ago. Right sternoclavicular joint and space infection with daily wet-to-dry dressings with 2 x 2 gauze.  History of MSSA culture, taking Keflex daily.  Past Medical History:  Diagnosis Date  . Anemia   . Arthritis    rheumatoid  . Depression   . GERD (gastroesophageal reflux disease)   . Hypertension 2019  . Ischemic colitis ([facility]) 09/2008  . Tachycardia     Past Surgical History:  Procedure Laterality Date  . APPENDECTOMY    . APPLICATION OF WOUND VAC Right 06/20/2019   Procedure: APPLICATION OF WOUND VAC;  Surgeon: [name], MD;  Location: [facility] OR;  Service: Vascular;  Laterality: Right;  . APPLICATION OF WOUND VAC Right 06/23/2019   Procedure: APPLICATION OF WOUND VAC;  Surgeon: [name], MD;  Location: [facility] OR;  Service: Thoracic;  Laterality: Right;  . I & D EXTREMITY Right 05/31/2019   Procedure: IRRIGATION AND DEBRIDEMENT LOWER EXTREMITY;  Surgeon: Tarry Kos, MD;  Location: [facility] OR;  Service: Orthopedics;  Laterality: Right;  . I & D EXTREMITY Right 06/20/2019   Procedure: DEBRIDEMENT OF STERNOCLAVICULAR JOINT ABSCESS;  Surgeon: [name], MD;  Location: [facility] OR;  Service: Vascular;  Laterality: Right;  . TEE WITHOUT CARDIOVERSION N/A 06/05/2019   Procedure: TRANSESOPHAGEAL ECHOCARDIOGRAM (TEE);  Surgeon: Lewayne Bunting, MD;  Location: [facility] ENDOSCOPY;  Service: Cardiovascular;  Laterality: N/A;  . WOUND EXPLORATION Right 06/23/2019   Procedure: irrigation and clean out right shoulder;  Surgeon: [name], MD;  Location: [facility] OR;  Service: Thoracic;  Laterality: Right;    Family History  Problem Relation Age of Onset  . Alcohol abuse Mother   . Depression  Mother   . Drug abuse Mother   . Physical abuse Mother   . Sexual abuse Mother   . ADD / ADHD Paternal Aunt   . Alcohol abuse Maternal Grandfather   . Bipolar disorder Maternal Grandmother   . Schizophrenia Maternal Grandmother   . Drug abuse Maternal Grandmother   . Alcohol abuse Paternal Grandfather   . Depression Paternal Grandfather   . Depression Paternal Grandmother   . Drug abuse Paternal Grandmother     Social History Social History   Tobacco Use  . Smoking status: Current Every Day Smoker    Packs/day: 0.50    Years: 11.00    Pack years: 5.50    Types: Cigarettes  . Smokeless tobacco: Never Used  Substance Use Topics  . Alcohol use: No    Comment: one beer every 2-3 months  . Drug use: No    Current Outpatient Medications  Medication Sig Dispense Refill  . atorvastatin (LIPITOR) 10 MG tablet Take 10 mg by mouth at bedtime.   12  . cephALEXin (KEFLEX) 500 MG capsule Take 1 capsule (500 mg total) by mouth 4 (four) times daily. 120 capsule 0  . cloNIDine (CATAPRES) 0.2 MG tablet Take 1 tablet (0.2 mg total) by mouth 3 (three) times daily. 270 tablet 0  . cyclobenzaprine (FLEXERIL) 10 MG tablet Take 1 tablet (10 mg total) by mouth 3 (three) times daily as needed for muscle spasms. 30 tablet 0  . esomeprazole (  NEXIUM) 40 MG capsule Take 40 mg by mouth daily.    [name] lisinopril (PRINIVIL,ZESTRIL) 10 MG tablet Take 10 mg by mouth daily.    [name] lithium carbonate (ESKALITH) 450 MG CR tablet Total of 750 mg daily (400 mg + 300 mg) (Patient taking differently: Take 450 mg by mouth at bedtime. Take with 300 mg to equal 750 mg at bedtime) 90 tablet 0  . Multiple Vitamin (MULTIVITAMIN WITH MINERALS) TABS tablet Take 1 tablet by mouth daily.    [name] oxyCODONE-acetaminophen (PERCOCET/ROXICET) 5-325 MG tablet Take by mouth every 8 (eight) hours as needed for severe pain.    [name] risperidone (RISPERDAL) 4 MG tablet Take 1 tablet (4 mg total) by mouth at bedtime. 90 tablet 0  . sertraline  (ZOLOFT) 100 MG tablet Take 1 tablet (100 mg total) by mouth daily. 90 tablet 0  . traZODone (DESYREL) 150 MG tablet Take 1 tablet (150 mg total) by mouth at bedtime. 90 tablet 0   No current facility-administered medications for this visit.    No Known Allergies  Review of Systems  Neuropathic pain is improved Packing the wound daily with a moist 4 x 4 Fever Take good Compliant with Keflex 500 mg 4 times daily BP 134/84 (BP Location: Right Arm, Patient Position: Sitting, Cuff Size: Normal)   Pulse (!) 120   Temp 97.7 F (36.5 C)   Resp 16   Ht 6' (1.829 m)   Wt 273 lb (123.8 kg)   SpO2 95% Comment: RA  BMI 37.03 kg/m  Physical Exam      Exam Sternoclavicular wound packed personally   General- alert and comfortable    Neck- no JVD, no cervical adenopathy palpable, no carotid bruit   Lungs- clear without rales, wheezes   Cor- regular rate and rhythm, no murmur , gallop   Abdomen- soft, non-tender   Extremities - warm, non-tender, minimal edema   Neuro- oriented, appropriate, no focal weakness   Diagnostic Tests: None  Impression: Right sternoclavicular joint MSSA infection healing by secondary intent  Plan: Continue daily dressing changes.  Continue oral Keflex.  Return for wound check in 2 weeks.   Len Childs, MD [facility] [phone]

## 2019-08-04 LAB — ACID FAST CULTURE WITH REFLEXED SENSITIVITIES (MYCOBACTERIA)
Acid Fast Culture: NEGATIVE
Acid Fast Culture: NEGATIVE

## 2019-08-06 ENCOUNTER — Other Ambulatory Visit: Payer: Self-pay | Admitting: *Deleted

## 2019-08-06 ENCOUNTER — Encounter: Payer: Self-pay | Admitting: Cardiothoracic Surgery

## 2019-08-06 ENCOUNTER — Ambulatory Visit: Payer: BC Managed Care – PPO | Admitting: Internal Medicine

## 2019-08-06 ENCOUNTER — Other Ambulatory Visit: Payer: Self-pay

## 2019-08-06 ENCOUNTER — Other Ambulatory Visit: Payer: Self-pay | Admitting: Cardiothoracic Surgery

## 2019-08-06 ENCOUNTER — Ambulatory Visit (INDEPENDENT_AMBULATORY_CARE_PROVIDER_SITE_OTHER): Payer: BC Managed Care – PPO | Admitting: Cardiothoracic Surgery

## 2019-08-06 ENCOUNTER — Encounter: Payer: Self-pay | Admitting: Internal Medicine

## 2019-08-06 VITALS — BP 120/86 | HR 120 | Temp 97.3°F | Resp 16 | Ht 72.0 in | Wt 275.0 lb

## 2019-08-06 VITALS — BP 141/92 | HR 128 | Temp 98.1°F | Wt 277.0 lb

## 2019-08-06 DIAGNOSIS — S21101D Unspecified open wound of right front wall of thorax without penetration into thoracic cavity, subsequent encounter: Secondary | ICD-10-CM | POA: Diagnosis not present

## 2019-08-06 DIAGNOSIS — Z452 Encounter for adjustment and management of vascular access device: Secondary | ICD-10-CM | POA: Diagnosis not present

## 2019-08-06 DIAGNOSIS — Z4889 Encounter for other specified surgical aftercare: Secondary | ICD-10-CM | POA: Diagnosis not present

## 2019-08-06 DIAGNOSIS — A4901 Methicillin susceptible Staphylococcus aureus infection, unspecified site: Secondary | ICD-10-CM | POA: Diagnosis not present

## 2019-08-06 DIAGNOSIS — Z5189 Encounter for other specified aftercare: Secondary | ICD-10-CM

## 2019-08-06 DIAGNOSIS — Z09 Encounter for follow-up examination after completed treatment for conditions other than malignant neoplasm: Secondary | ICD-10-CM | POA: Diagnosis not present

## 2019-08-06 DIAGNOSIS — M869 Osteomyelitis, unspecified: Secondary | ICD-10-CM

## 2019-08-06 NOTE — Progress Notes (Signed)
PCP is Glenda Chroman, MD Referring Provider is Glenda Chroman, MD  Chief Complaint  Patient presents with  . Routine Post Op    for check of s/c wound/packing    HPI: The patient returns for wound check. He has been packing the wound with wet-to-dry gauze. Last wound check was 2 weeks ago. He is on Keflex for history of culture positive MSSA infection of the right sternoclavicular joint.  I personally change the wound dressing today. It is clean granulation tissue. It is contracting well. It can only hold a moist single 2 x 2 gauze. Associated pain is also improved. Patient is packing the wound twice daily at home.  Past Medical History:  Diagnosis Date  . Anemia   . Arthritis    rheumatoid  . Depression   . GERD (gastroesophageal reflux disease)   . Hypertension 2019  . Ischemic colitis ([place]) 09/2008  . Tachycardia     Past Surgical History:  Procedure Laterality Date  . APPENDECTOMY    . APPLICATION OF WOUND VAC Right 06/20/2019   Procedure: APPLICATION OF WOUND VAC;  Surgeon: Ivin Poot, MD;  Location: [place];  Service: Vascular;  Laterality: Right;  . APPLICATION OF WOUND VAC Right 06/23/2019   Procedure: APPLICATION OF WOUND VAC;  Surgeon: Ivin Poot, MD;  Location: [place];  Service: Thoracic;  Laterality: Right;  . I & D EXTREMITY Right 05/31/2019   Procedure: IRRIGATION AND DEBRIDEMENT LOWER EXTREMITY;  Surgeon: Leandrew Koyanagi, MD;  Location: [place];  Service: Orthopedics;  Laterality: Right;  . I & D EXTREMITY Right 06/20/2019   Procedure: DEBRIDEMENT OF STERNOCLAVICULAR JOINT ABSCESS;  Surgeon: Ivin Poot, MD;  Location: [place];  Service: Vascular;  Laterality: Right;  . TEE WITHOUT CARDIOVERSION N/A 06/05/2019   Procedure: TRANSESOPHAGEAL ECHOCARDIOGRAM (TEE);  Surgeon: Lelon Perla, MD;  Location: [place];  Service: Cardiovascular;  Laterality: N/A;  . WOUND EXPLORATION Right 06/23/2019   Procedure: irrigation and clean out right shoulder;  Surgeon:  Ivin Poot, MD;  Location: [facility] OR;  Service: Thoracic;  Laterality: Right;    Family History  Problem Relation Age of Onset  . Alcohol abuse Mother   . Depression Mother   . Drug abuse Mother   . Physical abuse Mother   . Sexual abuse Mother   . ADD / ADHD Paternal Aunt   . Alcohol abuse Maternal Grandfather   . Bipolar disorder Maternal Grandmother   . Schizophrenia Maternal Grandmother   . Drug abuse Maternal Grandmother   . Alcohol abuse Paternal Grandfather   . Depression Paternal Grandfather   . Depression Paternal Grandmother   . Drug abuse Paternal Grandmother     Social History Social History   Tobacco Use  . Smoking status: Current Every Day Smoker    Packs/day: 0.50    Years: 11.00    Pack years: 5.50    Types: Cigarettes  . Smokeless tobacco: Never Used  Vaping Use  . Vaping Use: Never used  Substance Use Topics  . Alcohol use: No    Comment: one beer every 2-3 months  . Drug use: No    Current Outpatient Medications  Medication Sig Dispense Refill  . atorvastatin (LIPITOR) 10 MG tablet Take 10 mg by mouth at bedtime.   12  . cephALEXin (KEFLEX) 500 MG capsule Take 1 capsule (500 mg total) by mouth 4 (four) times daily. 120 capsule 0  . cloNIDine (CATAPRES) 0.2 MG tablet Take 1  tablet (0.2 mg total) by mouth 3 (three) times daily. 270 tablet 0  . cyclobenzaprine (FLEXERIL) 10 MG tablet Take 1 tablet (10 mg total) by mouth 3 (three) times daily as needed for muscle spasms. 30 tablet 0  . esomeprazole (NEXIUM) 40 MG capsule Take 40 mg by mouth daily.    [name] lisinopril (PRINIVIL,ZESTRIL) 10 MG tablet Take 10 mg by mouth daily.    [name] lithium carbonate (ESKALITH) 450 MG CR tablet Total of 750 mg daily (400 mg + 300 mg) (Patient taking differently: Take 450 mg by mouth at bedtime. Take with 300 mg to equal 750 mg at bedtime) 90 tablet 0  . Multiple Vitamin (MULTIVITAMIN WITH MINERALS) TABS tablet Take 1 tablet by mouth daily.    [name] oxyCODONE-acetaminophen  (PERCOCET/ROXICET) 5-325 MG tablet Take by mouth every 8 (eight) hours as needed for severe pain.    [name] risperidone (RISPERDAL) 4 MG tablet Take 1 tablet (4 mg total) by mouth at bedtime. 90 tablet 0  . sertraline (ZOLOFT) 100 MG tablet Take 1 tablet (100 mg total) by mouth daily. 90 tablet 0  . traZODone (DESYREL) 150 MG tablet Take 1 tablet (150 mg total) by mouth at bedtime. 90 tablet 0   No current facility-administered medications for this visit.    No Known Allergies  Review of Systems  No fever No bleeding from the wound No increase in pain of the right shoulder  BP 120/86 (BP Location: Right Arm, Patient Position: Sitting, Cuff Size: Large)   Pulse (!) 120   Temp (!) 97.3 F (36.3 C)   Resp 16   Ht 6' (1.829 m)   Wt 275 lb (124.7 kg)   SpO2 95% Comment: RA  BMI 37.30 kg/m  Physical Exam      Exam    General- alert and comfortable. Right sternoclavicular joint wound with clean granulation tissue and contracting    Neck- no JVD, no cervical adenopathy palpable, no carotid bruit   Lungs- clear without rales, wheezes   Cor- regular rate and rhythm, no murmur , gallop   Abdomen- soft, non-tender   Extremities - warm, non-tender, minimal edema   Neuro- oriented, appropriate, no focal weakness  Diagnostic Tests: Wound culture taken today to determine if MSSA still present  Impression: Significant improvement over the past 2 weeks this wound. Continue current care. Follow-up wound culture.  Plan: Return for wound check in 2 weeks.   [name], MD [facility] [phone]

## 2019-08-06 NOTE — Progress Notes (Signed)
Culture taken from wound and sent to [facility].

## 2019-08-07 ENCOUNTER — Encounter: Payer: Self-pay | Admitting: Internal Medicine

## 2019-08-07 LAB — SEDIMENTATION RATE: Sed Rate: 29 mm/h — ABNORMAL HIGH (ref 0–15)

## 2019-08-07 LAB — C-REACTIVE PROTEIN: CRP: 29.3 mg/L — ABNORMAL HIGH (ref <8.0)

## 2019-08-07 NOTE — Progress Notes (Signed)
   Subjective:    Patient ID: Herbert Walsh, male    DOB: 18-Apr-1990, 29 y.o.   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  His knee was debrided and initially managed without surgery for his sternal area but ultimately did require debridement done by Dr. [name] on 5/7.  Culture did again grow MSSA and he completed 4 weeks of cefazolin via IV.   He continues to follow Dr [name] for wound care.  Wound is improving.  No rash or diarrhea.  Wound swab done by Dr. [name]. No pus, only serosanguinous drainage noted.     Review of Systems  Constitutional: Negative for chills and fever.  Gastrointestinal: Negative for diarrhea and nausea.  Skin: Negative for rash.       Objective:   Physical Exam Constitutional:      Appearance: Normal appearance.  Eyes:     General: No scleral icterus. Cardiovascular:     Rate and Rhythm: Normal rate and regular rhythm.  Pulmonary:     Effort: Pulmonary effort is normal.  Skin:    Comments: Skin area over chest bandaged.  No surrounding erythema noted around bandage.  No significant discharge.   Neurological:     Mental Status: He is alert.  Psychiatric:        Mood and Affect: Mood normal.   SH: + tobacco        Assessment & Plan:

## 2019-08-07 NOTE — Assessment & Plan Note (Signed)
Healing by report and no signs of infection.  He will continue with wound care per Dr. [name].

## 2019-08-07 NOTE — Assessment & Plan Note (Signed)
Healing well and with the wound closing, seems to be resolving. Inflammatory markers checked at the visit and much improved, trending down appropriately.   I will have him complete one additional month of Keflex since he is tolerating it well and he will return in about 1 month and likely will not need further antibiotics after that unless new concerns noted.

## 2019-08-07 NOTE — Assessment & Plan Note (Signed)
This has been removed and no new concerns.

## 2019-08-11 ENCOUNTER — Telehealth (HOSPITAL_COMMUNITY): Payer: Self-pay | Admitting: Psychiatry

## 2019-08-11 ENCOUNTER — Telehealth (HOSPITAL_COMMUNITY): Payer: Self-pay | Admitting: *Deleted

## 2019-08-11 NOTE — Telephone Encounter (Signed)
Called his mother, Alvis Lemmings in response to her request. She states that he has been abusing cough medication again. Although she did try limiting his card access, he buys it or steal it from the store. He stays in the bed most of the time, although she tries to make him do things.  Although she denies concern of imminent danger to himself, she is concerned that it will eventually harm him given his current medical condition. Informed Ms. Dawn of petition process if she were to be concerned of imminent danger to himself or others and if he is not willing to come to ED. She verbalized her understanding.

## 2019-08-11 NOTE — Telephone Encounter (Signed)
noted 

## 2019-08-11 NOTE — Telephone Encounter (Signed)
Patient mother called stating she would like for Dr. Vanetta Shawl to please call her back. Per pt mother, she would like to update provider as to how patient has been before his appt on July 1. Per pt mother patient has been in this he dont care anymore mood and patient he been in and out of hospital and his most recent lab results was not good. Per pt mother, patient is doing drugs again and wants to talk to Dr. Vanetta Shawl. Patient mother number is [phone].
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
## 2019-08-12 LAB — ANAEROBIC AND AEROBIC CULTURE
AER RESULT:: NO GROWTH
GRAM STAIN:: NONE SEEN
MICRO NUMBER:: 10625266
MICRO NUMBER:: 10625267
SPECIMEN QUALITY:: ADEQUATE
SPECIMEN QUALITY:: ADEQUATE

## 2019-08-14 ENCOUNTER — Telehealth (INDEPENDENT_AMBULATORY_CARE_PROVIDER_SITE_OTHER): Payer: BC Managed Care – PPO | Admitting: Psychiatry

## 2019-08-14 ENCOUNTER — Encounter (HOSPITAL_COMMUNITY): Payer: Self-pay | Admitting: Psychiatry

## 2019-08-14 ENCOUNTER — Other Ambulatory Visit: Payer: Self-pay

## 2019-08-14 DIAGNOSIS — F4310 Post-traumatic stress disorder, unspecified: Secondary | ICD-10-CM | POA: Diagnosis not present

## 2019-08-14 DIAGNOSIS — F6381 Intermittent explosive disorder: Secondary | ICD-10-CM | POA: Diagnosis not present

## 2019-08-14 DIAGNOSIS — F331 Major depressive disorder, recurrent, moderate: Secondary | ICD-10-CM | POA: Diagnosis not present

## 2019-08-14 DIAGNOSIS — G4700 Insomnia, unspecified: Secondary | ICD-10-CM

## 2019-08-14 MED ORDER — TRAZODONE HCL 150 MG PO TABS: 150.0000 mg | ORAL_TABLET | Freq: Every day | ORAL | 0 refills | Status: DC

## 2019-08-14 MED ORDER — LITHIUM CARBONATE ER 450 MG PO TBCR: 450.0000 mg | EXTENDED_RELEASE_TABLET | Freq: Every day | ORAL | 0 refills | Status: DC

## 2019-08-14 MED ORDER — CLONIDINE HCL 0.2 MG PO TABS: 0.2000 mg | ORAL_TABLET | Freq: Three times a day (TID) | ORAL | 0 refills | Status: DC

## 2019-08-14 MED ORDER — LITHIUM CARBONATE ER 300 MG PO TBCR: EXTENDED_RELEASE_TABLET | ORAL | 0 refills | Status: DC

## 2019-08-14 MED ORDER — RISPERIDONE 4 MG PO TABS: 4.0000 mg | ORAL_TABLET | Freq: Every day | ORAL | 0 refills | Status: DC

## 2019-08-14 NOTE — Patient Instructions (Signed)
1.Increase sertraline 150 mg daily  2.Continuerisperidone4mg  at night 3.Continuelithium750mg  at night 4.Continueclonidine 0.2 mg three times a day 6.ContinueTrazodone150mg  daily  7.Next appointment: 8/12 at 1:30

## 2019-08-20 ENCOUNTER — Other Ambulatory Visit: Payer: Self-pay

## 2019-08-20 ENCOUNTER — Ambulatory Visit: Payer: BC Managed Care – PPO | Admitting: Cardiothoracic Surgery

## 2019-08-20 ENCOUNTER — Encounter: Payer: Self-pay | Admitting: Cardiothoracic Surgery

## 2019-08-20 DIAGNOSIS — S21101S Unspecified open wound of right front wall of thorax without penetration into thoracic cavity, sequela: Secondary | ICD-10-CM

## 2019-08-20 NOTE — Progress Notes (Signed)
PCP is Ignatius Specking, MD Referring Provider is Albertha Ghee, MD  Chief Complaint  Patient presents with  . Wound Check    R sternoclavicular wound, review labs from 08/06/19    HPI: Patient denies fever fatigue or right shoulder pain. Unable to pack the soft tissue tunneled wound and clear fluid has been draining.  Previous culture shows no growth.  He has completed his course of oral Keflex.  Past Medical History:  Diagnosis Date  . Anemia   . Arthritis    rheumatoid  . Depression   . GERD (gastroesophageal reflux disease)   . Hypertension 2019  . Ischemic colitis (HCC) 09/2008  . Tachycardia     Past Surgical History:  Procedure Laterality Date  . APPENDECTOMY    . APPLICATION OF WOUND VAC Right 06/20/2019   Procedure: APPLICATION OF WOUND VAC;  Surgeon: [name], MD;  Location: [facility] OR;  Service: Vascular;  Laterality: Right;  . APPLICATION OF WOUND VAC Right 06/23/2019   Procedure: APPLICATION OF WOUND VAC;  Surgeon: [name], MD;  Location: [facility] OR;  Service: Thoracic;  Laterality: Right;  . I & D EXTREMITY Right 05/31/2019   Procedure: IRRIGATION AND DEBRIDEMENT LOWER EXTREMITY;  Surgeon: Tarry Kos, MD;  Location: [facility] OR;  Service: Orthopedics;  Laterality: Right;  . I & D EXTREMITY Right 06/20/2019   Procedure: DEBRIDEMENT OF STERNOCLAVICULAR JOINT ABSCESS;  Surgeon: [name], MD;  Location: [facility] OR;  Service: Vascular;  Laterality: Right;  . TEE WITHOUT CARDIOVERSION N/A 06/05/2019   Procedure: TRANSESOPHAGEAL ECHOCARDIOGRAM (TEE);  Surgeon: Lewayne Bunting, MD;  Location: [facility] ENDOSCOPY;  Service: Cardiovascular;  Laterality: N/A;  . WOUND EXPLORATION Right 06/23/2019   Procedure: irrigation and clean out right shoulder;  Surgeon: [name], MD;  Location: [facility] OR;  Service: Thoracic;  Laterality: Right;    Family History  Problem Relation Age of Onset  . Alcohol abuse Mother   . Depression Mother   . Drug abuse Mother   . Physical abuse Mother    . Sexual abuse Mother   . ADD / ADHD Paternal Aunt   . Alcohol abuse Maternal Grandfather   . Bipolar disorder Maternal Grandmother   . Schizophrenia Maternal Grandmother   . Drug abuse Maternal Grandmother   . Alcohol abuse Paternal Grandfather   . Depression Paternal Grandfather   . Depression Paternal Grandmother   . Drug abuse Paternal Grandmother     Social History Social History   Tobacco Use  . Smoking status: Current Every Day Smoker    Packs/day: 0.50    Years: 11.00    Pack years: 5.50    Types: Cigarettes  . Smokeless tobacco: Never Used  Vaping Use  . Vaping Use: Never used  Substance Use Topics  . Alcohol use: No    Comment: one beer every 2-3 months  . Drug use: No    Current Outpatient Medications  Medication Sig Dispense Refill  . atorvastatin (LIPITOR) 10 MG tablet Take 10 mg by mouth at bedtime.   12  . cloNIDine (CATAPRES) 0.2 MG tablet Take 1 tablet (0.2 mg total) by mouth 3 (three) times daily. 270 tablet 0  . cyclobenzaprine (FLEXERIL) 10 MG tablet Take 1 tablet (10 mg total) by mouth 3 (three) times daily as needed for muscle spasms. 30 tablet 0  . esomeprazole (NEXIUM) 40 MG capsule Take 40 mg by mouth daily.    [name] lisinopril (PRINIVIL,ZESTRIL) 10 MG tablet Take 10 mg  by mouth daily.    [name] lithium carbonate (ESKALITH) 450 MG CR tablet Take 1 tablet (450 mg total) by mouth at bedtime. Take with 300 mg to equal 750 mg at bedtime 90 tablet 0  . lithium carbonate (LITHOBID) 300 MG CR tablet Take total of 750 mg at night along with 400 mg tab 90 tablet 0  . Multiple Vitamin (MULTIVITAMIN WITH MINERALS) TABS tablet Take 1 tablet by mouth daily.    [name] oxyCODONE-acetaminophen (PERCOCET/ROXICET) 5-325 MG tablet Take by mouth every 8 (eight) hours as needed for severe pain.    [name] risperidone (RISPERDAL) 4 MG tablet Take 1 tablet (4 mg total) by mouth at bedtime. 90 tablet 0  . sertraline (ZOLOFT) 100 MG tablet Take 1 tablet (100 mg total) by mouth daily. 90  tablet 0  . traZODone (DESYREL) 150 MG tablet Take 1 tablet (150 mg total) by mouth at bedtime. 90 tablet 0   No current facility-administered medications for this visit.    No Known Allergies  Review of Systems  No new complaints, no fever  BP 122/77 (BP Location: Left Arm)   Pulse (!) 122 Comment: regular  Temp 98.6 F (37 C) (Temporal)   Resp 20   Ht 6' (1.829 m)   Wt 275 lb (124.7 kg)   SpO2 96% Comment: RA  BMI 37.30 kg/m  Physical Exam       Exam    General- alert and comfortable      Wound in right sternoclavicular joint personally examined and repacked after application of activated human collagen using packing with half-inch moistened Nu Gauze and saline     Neck- no JVD, no cervical adenopathy palpable, no carotid bruit   Lungs- clear without rales, wheezes   Cor- regular rate and rhythm, no murmur , gallop   Abdomen- soft, non-tender   Extremities - warm, non-tender, minimal edema   Neuro- oriented, appropriate, no focal weakness   Diagnostic Tests: None  Impression: Slow healing of right sternoclavicular joint wound.  It is now fairly narrow but remains clean.  We will increase the application of activated human collagen to help fill the defect more quickly and the material was provided the patient for his family to use at home every other day  Plan: Return in 1 week for wound check.   [name], MD [facility] [phone]

## 2019-08-27 ENCOUNTER — Other Ambulatory Visit: Payer: Self-pay | Admitting: Cardiothoracic Surgery

## 2019-08-27 ENCOUNTER — Other Ambulatory Visit: Payer: Self-pay | Admitting: *Deleted

## 2019-08-27 ENCOUNTER — Ambulatory Visit (INDEPENDENT_AMBULATORY_CARE_PROVIDER_SITE_OTHER): Payer: BC Managed Care – PPO | Admitting: Cardiothoracic Surgery

## 2019-08-27 ENCOUNTER — Other Ambulatory Visit: Payer: Self-pay

## 2019-08-27 ENCOUNTER — Encounter: Payer: Self-pay | Admitting: Cardiothoracic Surgery

## 2019-08-27 VITALS — BP 125/83 | HR 100 | Temp 97.8°F | Resp 20 | Ht 72.0 in | Wt 275.0 lb

## 2019-08-27 DIAGNOSIS — M00011 Staphylococcal arthritis, right shoulder: Secondary | ICD-10-CM | POA: Diagnosis not present

## 2019-08-27 DIAGNOSIS — L988 Other specified disorders of the skin and subcutaneous tissue: Secondary | ICD-10-CM | POA: Insufficient documentation

## 2019-08-27 DIAGNOSIS — Z09 Encounter for follow-up examination after completed treatment for conditions other than malignant neoplasm: Secondary | ICD-10-CM

## 2019-08-27 MED ORDER — OXYCODONE-ACETAMINOPHEN 10-325 MG PO TABS: 1.0000 | ORAL_TABLET | Freq: Three times a day (TID) | ORAL | 0 refills | Status: AC | PRN

## 2019-08-27 NOTE — Addendum Note (Signed)
Addended by: Rowland Lathe R on: 08/27/2019 03:26 PM   Modules accepted: Orders

## 2019-08-27 NOTE — Progress Notes (Signed)
PCP is Ignatius Specking, MD Referring Provider is Ignatius Specking, MD  Chief Complaint  Patient presents with  . Routine Post Op    wound check    HPI: 1 week return for wound check.  The patient had debridement of a right sternoclavicular joint 2 months ago for staphylococcal infection.  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  Minimal drainage.  Currently off antibiotics.  Last time he was at the office activated human collagen was in applied to the wound.  The patient returns with minimal change in the wound.  He still has some soreness in his right shoulder.  He has some mild drainage from the sinus tract and changes it daily.  I personally inspected the wound today.  It is a depth of 2.5 cm.  Repeat culture was taken but it appears to be clean.  I used an 18-gauge Angiocath with activated human collagen applied to a small syringe to the depth of the wound.  Wet-to-dry dressing was applied at the surface covered with a Tegaderm.  The patient will change the dressing every day but not attempt to pack the very narrow sinus tract.  Return for wound check in 1 week.   Past Medical History:  Diagnosis Date  . Anemia   . Arthritis    rheumatoid  . Depression   . GERD (gastroesophageal reflux disease)   . Hypertension 2019  . Ischemic colitis (HCC) 09/2008  . Tachycardia     Past Surgical History:  Procedure Laterality Date  . APPENDECTOMY    . APPLICATION OF WOUND VAC Right 06/20/2019   Procedure: APPLICATION OF WOUND VAC;  Surgeon: [name], MD;  Location: [facility] OR;  Service: Vascular;  Laterality: Right;  . APPLICATION OF WOUND VAC Right 06/23/2019   Procedure: APPLICATION OF WOUND VAC;  Surgeon: [name], MD;  Location: [facility] OR;  Service: Thoracic;  Laterality: Right;  . I & D EXTREMITY Right 05/31/2019   Procedure: IRRIGATION AND DEBRIDEMENT LOWER EXTREMITY;  Surgeon: Tarry Kos, MD;   Location: [facility] OR;  Service: Orthopedics;  Laterality: Right;  . I & D EXTREMITY Right 06/20/2019   Procedure: DEBRIDEMENT OF STERNOCLAVICULAR JOINT ABSCESS;  Surgeon: [name], MD;  Location: [facility] OR;  Service: Vascular;  Laterality: Right;  . TEE WITHOUT CARDIOVERSION N/A 06/05/2019   Procedure: TRANSESOPHAGEAL ECHOCARDIOGRAM (TEE);  Surgeon: Lewayne Bunting, MD;  Location: [facility] ENDOSCOPY;  Service: Cardiovascular;  Laterality: N/A;  . WOUND EXPLORATION Right 06/23/2019   Procedure: irrigation and clean out right shoulder;  Surgeon: [name], MD;  Location: [facility] OR;  Service: Thoracic;  Laterality: Right;    Family History  Problem Relation Age of Onset  . Alcohol abuse Mother   . Depression Mother   . Drug abuse Mother   . Physical abuse Mother   . Sexual abuse Mother   . ADD / ADHD Paternal Aunt   . Alcohol abuse Maternal Grandfather   . Bipolar disorder Maternal Grandmother   . Schizophrenia Maternal Grandmother   . Drug abuse Maternal Grandmother   . Alcohol abuse Paternal Grandfather   . Depression Paternal Grandfather   . Depression Paternal Grandmother   . Drug abuse Paternal Grandmother     Social History Social History   Tobacco Use  . Smoking status: Current Every Day Smoker    Packs/day: 0.50    Years: 11.00    Pack  years: 5.50    Types: Cigarettes  . Smokeless tobacco: Never Used  Vaping Use  . Vaping Use: Never used  Substance Use Topics  . Alcohol use: No    Comment: one beer every 2-3 months  . Drug use: No    Current Outpatient Medications  Medication Sig Dispense Refill  . atorvastatin (LIPITOR) 10 MG tablet Take 10 mg by mouth at bedtime.   12  . cloNIDine (CATAPRES) 0.2 MG tablet Take 1 tablet (0.2 mg total) by mouth 3 (three) times daily. 270 tablet 0  . cyclobenzaprine (FLEXERIL) 10 MG tablet Take 1 tablet (10 mg total) by mouth 3 (three) times daily as needed for muscle spasms. 30 tablet 0  . esomeprazole (NEXIUM) 40 MG capsule Take 40  mg by mouth daily.    [name] lisinopril (PRINIVIL,ZESTRIL) 10 MG tablet Take 10 mg by mouth daily.    [name] lithium carbonate (ESKALITH) 450 MG CR tablet Take 1 tablet (450 mg total) by mouth at bedtime. Take with 300 mg to equal 750 mg at bedtime 90 tablet 0  . lithium carbonate (LITHOBID) 300 MG CR tablet Take total of 750 mg at night along with 400 mg tab 90 tablet 0  . Multiple Vitamin (MULTIVITAMIN WITH MINERALS) TABS tablet Take 1 tablet by mouth daily.    [name] oxyCODONE-acetaminophen (PERCOCET/ROXICET) 5-325 MG tablet Take by mouth every 8 (eight) hours as needed for severe pain.    [name] risperidone (RISPERDAL) 4 MG tablet Take 1 tablet (4 mg total) by mouth at bedtime. 90 tablet 0  . sertraline (ZOLOFT) 100 MG tablet Take 1 tablet (100 mg total) by mouth daily. (Patient taking differently: Take 150 mg by mouth daily. ) 90 tablet 0  . traZODone (DESYREL) 150 MG tablet Take 1 tablet (150 mg total) by mouth at bedtime. 90 tablet 0   No current facility-administered medications for this visit.    No Known Allergies  Review of Systems   No fever No shortness of breath No edema  BP 125/83   Pulse 100   Temp 97.8 F (36.6 C) (Skin)   Resp 20   Ht 6' (1.829 m)   Wt 275 lb (124.7 kg)   SpO2 93% Comment: RA  BMI 37.30 kg/m  Physical Exam     Physical Exam  General: The right sternoclavicular joint sinus tract extends to a depth of 2.5 cm in the subcutaneous space as described above. HEENT: Normocephalic pupils equal , dentition adequate Neck: Supple without JVD, adenopathy, or bruit Chest: Clear to auscultation, symmetrical breath sounds, no rhonchi, no tenderness             or deformity Cardiovascular: Regular rate and rhythm, no murmur, no gallop, peripheral pulses             palpable in all extremities Abdomen:  Soft, nontender, no palpable mass or organomegaly Extremities: Warm, well-perfused, no clubbing cyanosis edema or tenderness,              no venous stasis changes of the  legs Rectal/GU: Deferred Neuro: Grossly non--focal and symmetrical throughout Skin: Clean and dry without rash or ulceration   Diagnostic Tests: Culture wound obtained  Impression: Right sternoclavicular joint space infection with persistent sinus tract which today was cleaned, cauterized with silver nitrate, and activated human collagen injected into the depths of the sinus tract to help close the space  Plan: Continue daily dressing changes at home and return in 1 week for wound check.  Follow-up  results of wound culture.   [name], MD [facility] [phone]

## 2019-08-28 ENCOUNTER — Other Ambulatory Visit: Payer: Self-pay | Admitting: *Deleted

## 2019-08-30 LAB — WOUND CULTURE
MICRO NUMBER:: 10704821
SPECIMEN QUALITY:: ADEQUATE

## 2019-08-30 LAB — HOUSE ACCOUNT TRACKING

## 2019-09-01 ENCOUNTER — Other Ambulatory Visit: Payer: Self-pay

## 2019-09-01 MED ORDER — CEPHALEXIN 500 MG PO CAPS
500.0000 mg | ORAL_CAPSULE | Freq: Three times a day (TID) | ORAL | 0 refills | Status: AC
Start: 2019-09-01 — End: 2019-09-11

## 2019-09-02 ENCOUNTER — Telehealth: Payer: Self-pay

## 2019-09-02 NOTE — Telephone Encounter (Signed)
Per Dr. [name], Rx for Keflex 500 mg tid x 10 days submitted (by Ned Clines, RN) to [facility] in [place] yesterday evening. TC to pt at approximately 1730 to inform of new Rx by Dr. [name]. Pt states he will begin taking this evening. He has a f/u appt with Dr. [name] on 09/03/19.

## 2019-09-03 ENCOUNTER — Other Ambulatory Visit: Payer: Self-pay

## 2019-09-03 ENCOUNTER — Ambulatory Visit (INDEPENDENT_AMBULATORY_CARE_PROVIDER_SITE_OTHER): Payer: BC Managed Care – PPO | Admitting: Cardiothoracic Surgery

## 2019-09-03 ENCOUNTER — Encounter: Payer: Self-pay | Admitting: Cardiothoracic Surgery

## 2019-09-03 DIAGNOSIS — Z4801 Encounter for change or removal of surgical wound dressing: Secondary | ICD-10-CM | POA: Insufficient documentation

## 2019-09-03 DIAGNOSIS — Z4800 Encounter for change or removal of nonsurgical wound dressing: Secondary | ICD-10-CM

## 2019-09-03 NOTE — Progress Notes (Signed)
PCP is Ignatius Specking, MD Referring Provider is Ignatius Specking, MD  Chief Complaint  Patient presents with  . Wound Check    R sternoclavicular joint wound, s/p I&D on 06/23/19    HPI: Return for wound check on right sternoclavicular wound.  Patient has a 2.5 cm sinus tract draining clear fluid.  A culture of it last week grew out MSSA and he has been started on Keflex.  We are injecting activated human collagen into the wound to help close it.  Today I personally examined the wound.  I applied silver nitrate followed by 2 cc of activated human collagen into the wound.  Dry dressing was placed.  He will continue to change the dressing at home.  It is too narrow to be packed.   Past Medical History:  Diagnosis Date  . Anemia   . Arthritis    rheumatoid  . Depression   . GERD (gastroesophageal reflux disease)   . Hypertension 2019  . Ischemic colitis (HCC) 09/2008  . Tachycardia     Past Surgical History:  Procedure Laterality Date  . APPENDECTOMY    . APPLICATION OF WOUND VAC Right 06/20/2019   Procedure: APPLICATION OF WOUND VAC;  Surgeon: [name], MD;  Location: [facility] OR;  Service: Vascular;  Laterality: Right;  . APPLICATION OF WOUND VAC Right 06/23/2019   Procedure: APPLICATION OF WOUND VAC;  Surgeon: [name], MD;  Location: [facility] OR;  Service: Thoracic;  Laterality: Right;  . I & D EXTREMITY Right 05/31/2019   Procedure: IRRIGATION AND DEBRIDEMENT LOWER EXTREMITY;  Surgeon: Tarry Kos, MD;  Location: [facility] OR;  Service: Orthopedics;  Laterality: Right;  . I & D EXTREMITY Right 06/20/2019   Procedure: DEBRIDEMENT OF STERNOCLAVICULAR JOINT ABSCESS;  Surgeon: [name], MD;  Location: [facility] OR;  Service: Vascular;  Laterality: Right;  . TEE WITHOUT CARDIOVERSION N/A 06/05/2019   Procedure: TRANSESOPHAGEAL ECHOCARDIOGRAM (TEE);  Surgeon: Lewayne Bunting, MD;  Location: [facility] ENDOSCOPY;  Service: Cardiovascular;  Laterality: N/A;  . WOUND EXPLORATION Right 06/23/2019    Procedure: irrigation and clean out right shoulder;  Surgeon: [name], MD;  Location: [facility] OR;  Service: Thoracic;  Laterality: Right;    Family History  Problem Relation Age of Onset  . Alcohol abuse Mother   . Depression Mother   . Drug abuse Mother   . Physical abuse Mother   . Sexual abuse Mother   . ADD / ADHD Paternal Aunt   . Alcohol abuse Maternal Grandfather   . Bipolar disorder Maternal Grandmother   . Schizophrenia Maternal Grandmother   . Drug abuse Maternal Grandmother   . Alcohol abuse Paternal Grandfather   . Depression Paternal Grandfather   . Depression Paternal Grandmother   . Drug abuse Paternal Grandmother     Social History Social History   Tobacco Use  . Smoking status: Current Every Day Smoker    Packs/day: 0.50    Years: 11.00    Pack years: 5.50    Types: Cigarettes  . Smokeless tobacco: Never Used  Vaping Use  . Vaping Use: Never used  Substance Use Topics  . Alcohol use: No    Comment: one beer every 2-3 months  . Drug use: No    Current Outpatient Medications  Medication Sig Dispense Refill  . atorvastatin (LIPITOR) 10 MG tablet Take 10 mg by mouth at bedtime.   12  . cephALEXin (KEFLEX) 500 MG capsule Take 1 capsule (500 mg  total) by mouth 3 (three) times daily for 10 days. 30 capsule 0  . cloNIDine (CATAPRES) 0.2 MG tablet Take 1 tablet (0.2 mg total) by mouth 3 (three) times daily. 270 tablet 0  . cyclobenzaprine (FLEXERIL) 10 MG tablet Take 1 tablet (10 mg total) by mouth 3 (three) times daily as needed for muscle spasms. 30 tablet 0  . esomeprazole (NEXIUM) 40 MG capsule Take 40 mg by mouth daily.    [name] lisinopril (PRINIVIL,ZESTRIL) 10 MG tablet Take 10 mg by mouth daily.    [name] lithium carbonate (ESKALITH) 450 MG CR tablet Take 1 tablet (450 mg total) by mouth at bedtime. Take with 300 mg to equal 750 mg at bedtime 90 tablet 0  . lithium carbonate (LITHOBID) 300 MG CR tablet Take total of 750 mg at night along with 400 mg tab 90  tablet 0  . Multiple Vitamin (MULTIVITAMIN WITH MINERALS) TABS tablet Take 1 tablet by mouth daily.    [name] oxyCODONE-acetaminophen (PERCOCET) 10-325 MG tablet Take 1 tablet by mouth every 8 (eight) hours as needed for up to 7 days for pain. 21 tablet 0  . risperidone (RISPERDAL) 4 MG tablet Take 1 tablet (4 mg total) by mouth at bedtime. 90 tablet 0  . sertraline (ZOLOFT) 100 MG tablet Take 1 tablet (100 mg total) by mouth daily. (Patient taking differently: Take 150 mg by mouth daily. ) 90 tablet 0  . traZODone (DESYREL) 150 MG tablet Take 1 tablet (150 mg total) by mouth at bedtime. 90 tablet 0   No current facility-administered medications for this visit.    No Known Allergies  Review of Systems  No fever Integrity of his chest and strength of his chest is improving  BP 129/87 (BP Location: Left Arm, Patient Position: Sitting, Cuff Size: Large)   Pulse (!) 118   Temp 98.3 F (36.8 C) (Oral)   Resp (!) 24   Ht 6' (1.829 m)   Wt 275 lb (124.7 kg)   SpO2 94% Comment: RA  BMI 37.30 kg/m  Physical Exam Alert and comfortable Lungs clear No erythema or fluctuance in the neck Heart rate regular  Diagnostic Tests: None other than wound culture from last week which is reviewed and reported as above  Impression: Small sinus tract remains in the area of a previous sternoclavicular abscess.  We will continue to treat with antibiotics and weekly application of accelerated collagen and dressing changes  Plan: Return in 1 week.   [name], MD [facility] [phone]

## 2019-09-10 ENCOUNTER — Other Ambulatory Visit: Payer: Self-pay

## 2019-09-10 ENCOUNTER — Ambulatory Visit: Payer: BC Managed Care – PPO | Admitting: Internal Medicine

## 2019-09-10 ENCOUNTER — Encounter: Payer: Self-pay | Admitting: Internal Medicine

## 2019-09-10 ENCOUNTER — Ambulatory Visit (INDEPENDENT_AMBULATORY_CARE_PROVIDER_SITE_OTHER): Payer: BC Managed Care – PPO | Admitting: *Deleted

## 2019-09-10 ENCOUNTER — Encounter: Payer: Self-pay | Admitting: Cardiothoracic Surgery

## 2019-09-10 VITALS — BP 107/79 | HR 118 | Temp 98.1°F | Resp 16 | Ht 72.0 in | Wt 275.0 lb

## 2019-09-10 DIAGNOSIS — M869 Osteomyelitis, unspecified: Secondary | ICD-10-CM

## 2019-09-10 DIAGNOSIS — Z4801 Encounter for change or removal of surgical wound dressing: Secondary | ICD-10-CM | POA: Diagnosis not present

## 2019-09-10 DIAGNOSIS — Z4800 Encounter for change or removal of nonsurgical wound dressing: Secondary | ICD-10-CM

## 2019-09-10 DIAGNOSIS — A4901 Methicillin susceptible Staphylococcus aureus infection, unspecified site: Secondary | ICD-10-CM

## 2019-09-10 DIAGNOSIS — M00061 Staphylococcal arthritis, right knee: Secondary | ICD-10-CM | POA: Diagnosis not present

## 2019-09-10 DIAGNOSIS — S21101D Unspecified open wound of right front wall of thorax without penetration into thoracic cavity, subsequent encounter: Secondary | ICD-10-CM

## 2019-09-10 DIAGNOSIS — M00011 Staphylococcal arthritis, right shoulder: Secondary | ICD-10-CM

## 2019-09-10 NOTE — Progress Notes (Unsigned)
Dr. [name] had to leave the office today before he saw Herbert Walsh for his scheduled appointment.  He is here to check his sternoclavicular open area/wound followed by activated collagen instillation/dry dressing.  I observed the site with the home dressing still intact.  There was a lot of bloody drainage.  His wife said that there has been a lot of bloody drainage lately.  I cleaned the area with saline.  There is tunneling at 12 and 1 o'clock.  I instilled the collagen but it wouldn't stay intact well due to the liquid drainage.  It was completed as best as I could, followed by a dry 2x2, then 4x4 dressing, then a transparent tegaderm.  I will notify Dr. [name] of this drainage to see if he should be seen sooner.  Otherwise, he will return in one week.

## 2019-09-11 ENCOUNTER — Encounter: Payer: Self-pay | Admitting: Internal Medicine

## 2019-09-11 ENCOUNTER — Other Ambulatory Visit: Payer: Self-pay | Admitting: *Deleted

## 2019-09-11 DIAGNOSIS — S21101D Unspecified open wound of right front wall of thorax without penetration into thoracic cavity, subsequent encounter: Secondary | ICD-10-CM

## 2019-09-11 NOTE — Assessment & Plan Note (Signed)
No further issues with his knees, no swelling or concerns.

## 2019-09-11 NOTE — Progress Notes (Signed)
   Subjective:    Patient ID: Herbert Walsh, male    DOB: 01-Sep-1990, 29 y.o.   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  His knee was debrided and initially managed without surgery for his sternal area but ultimately did require debridement done by Dr. [name] on 5/7.  Culture did again grow MSSA and he completed 4 weeks of cefazolin via IV.   He continues to follow Dr [name] for wound care.  Wound is improving.  No rash or diarrhea.  Wound swab done by Dr. [name] with MSSA and put back on Keflex.  Today, wound has had some bloody discharge and he is following with Dr. [name] after this appointment.  No fever or chills.    Review of Systems  Constitutional: Negative for chills and fever.  Gastrointestinal: Negative for diarrhea and nausea.  Skin: Negative for rash.       Objective:   Physical Exam Constitutional:      Appearance: Normal appearance.  Eyes:     General: No scleral icterus. Cardiovascular:     Rate and Rhythm: Normal rate and regular rhythm.  Pulmonary:     Effort: Pulmonary effort is normal.  Skin:    Comments: Covered bandage, blood noted, not actively bleeding.    Neurological:     Mental Status: He is alert.  Psychiatric:        Mood and Affect: Mood normal.   SH: + tobacco        Assessment & Plan:

## 2019-09-11 NOTE — Assessment & Plan Note (Signed)
He has completed prolonged antibiotics for his MSSA infection and now just with wound care needs.  His inflammatory markers have trended down.   No further antibiotics indicated.

## 2019-09-12 ENCOUNTER — Ambulatory Visit
Admission: RE | Admit: 2019-09-12 | Discharge: 2019-09-12 | Disposition: A | Discharge status: Home / Self Care | Payer: BC Managed Care – PPO | Source: Ambulatory Visit | Attending: Cardiothoracic Surgery | Admitting: Cardiothoracic Surgery

## 2019-09-12 ENCOUNTER — Other Ambulatory Visit: Payer: Self-pay

## 2019-09-12 DIAGNOSIS — S21101D Unspecified open wound of right front wall of thorax without penetration into thoracic cavity, subsequent encounter: Secondary | ICD-10-CM

## 2019-09-12 IMAGING — CT CT CHEST W/ CM
1 of 2 series · 15 of 29 positions shown, 19 images · IV contrast (iopamidol)
Comparison: [DATE]

CLINICAL DATA: Stab wound with sinus tract. MSSA positive since [DATE], with debridement.

EXAM:
CT CHEST WITH CONTRAST
TECHNIQUE: Multidetector CT imaging of the chest was performed during
intravenous contrast administration.
CONTRAST:  75mL [QJ] IOPAMIDOL ([QJ]) INJECTION 61%

[Series 2: chest w/cm · axial · 0.75mm/px · z∈[-363,-81]mm · 15 of 157 slices shown, 19 images]
[im 8/157  mediastinal]
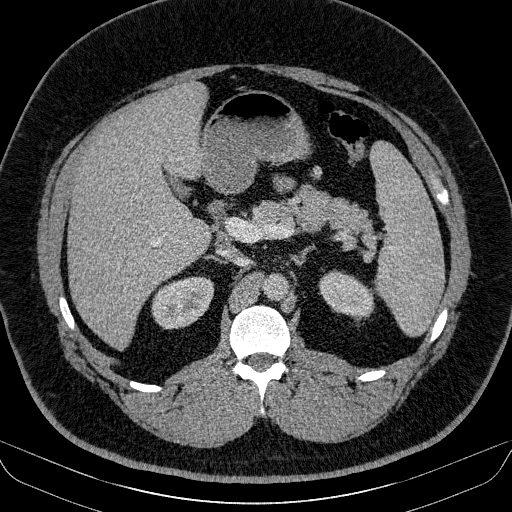
[im 8/157  lung]
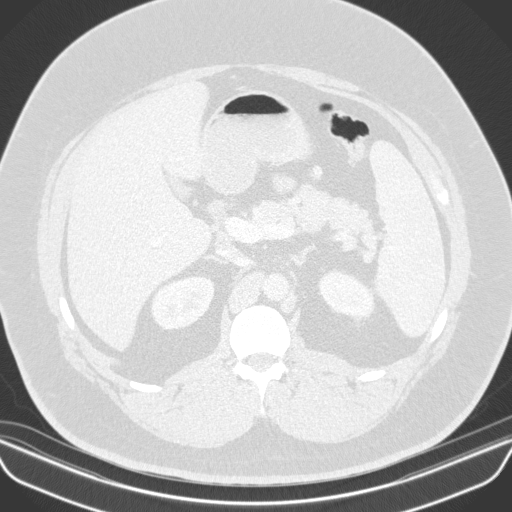
[im 24/157  lung]
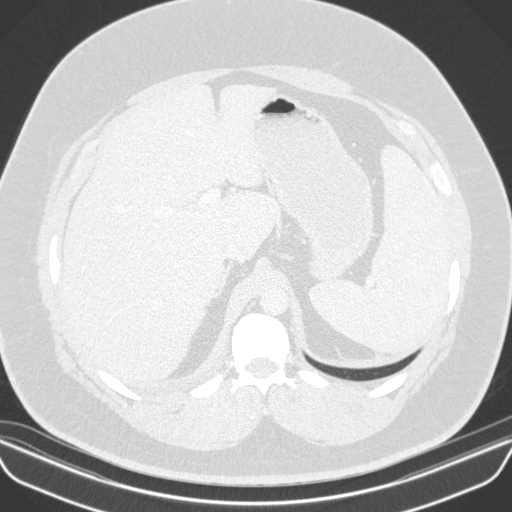
[im 32/157  lung]
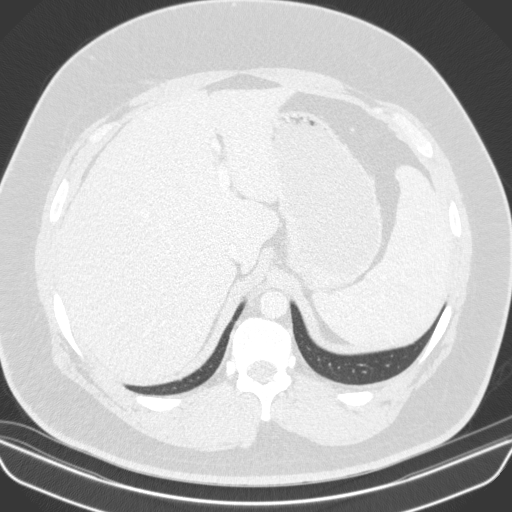
[im 40/157  lung]
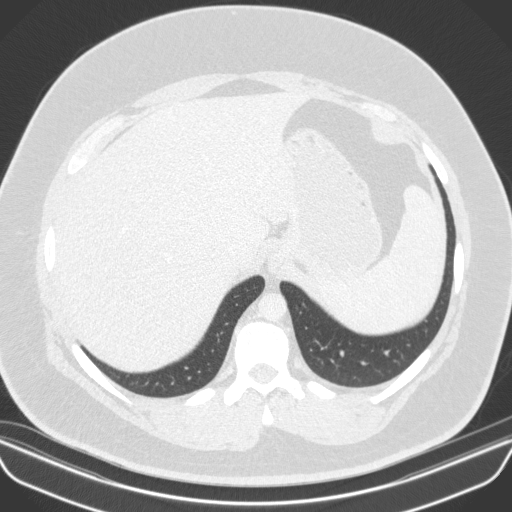
[im 55/157  mediastinal]
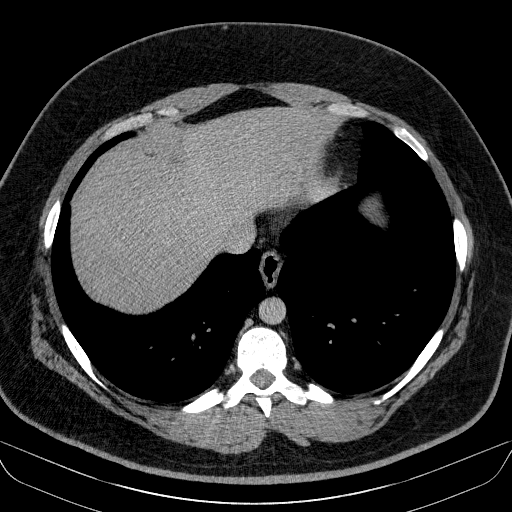
[im 55/157  lung]
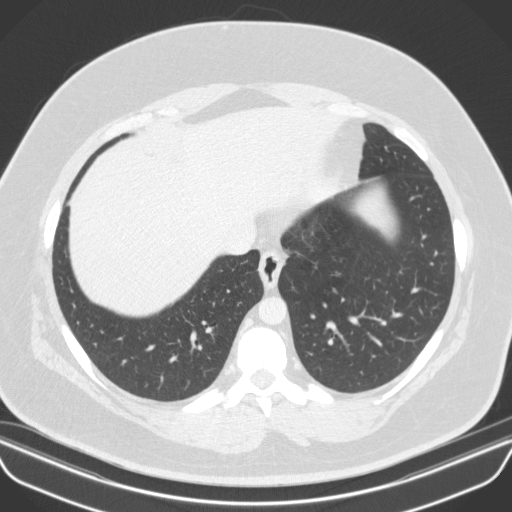
[im 63/157  lung]
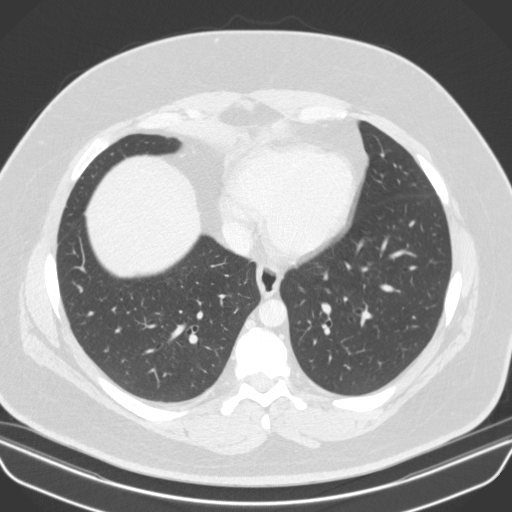
[im 71/157  lung]
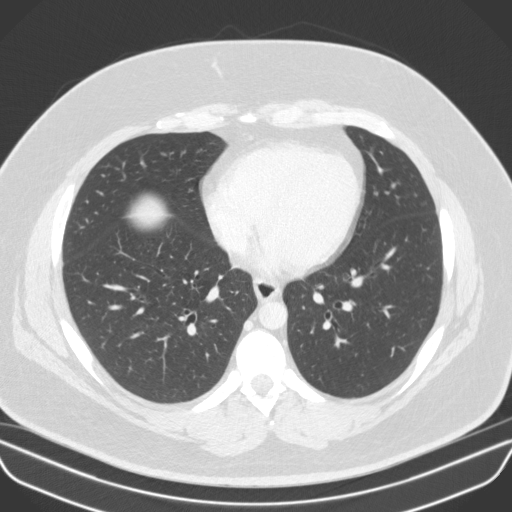
[im 85/157  lung]
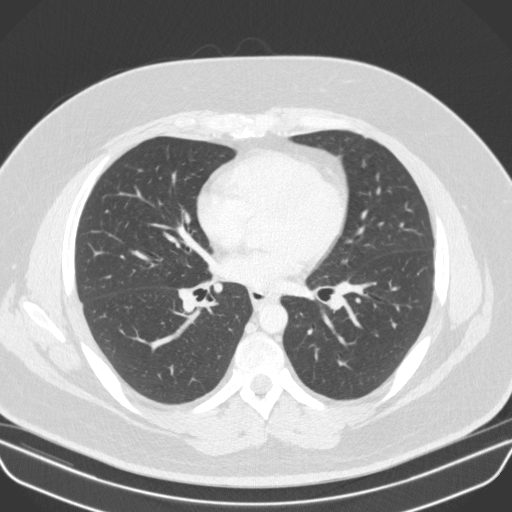
[im 86/157  mediastinal]
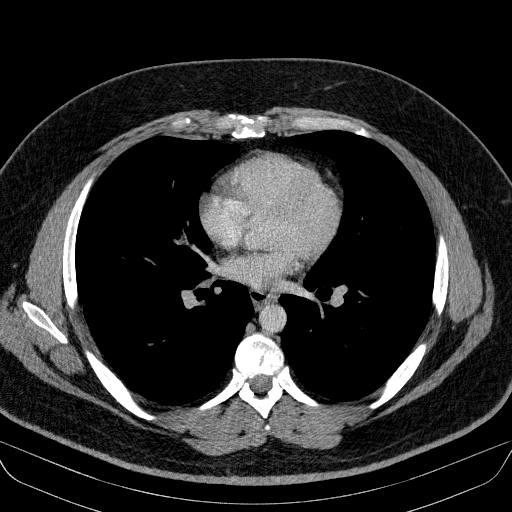
[im 86/157  lung]
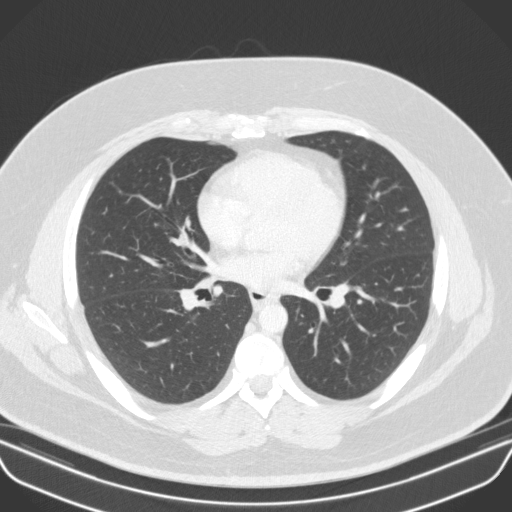
[im 94/157  lung]
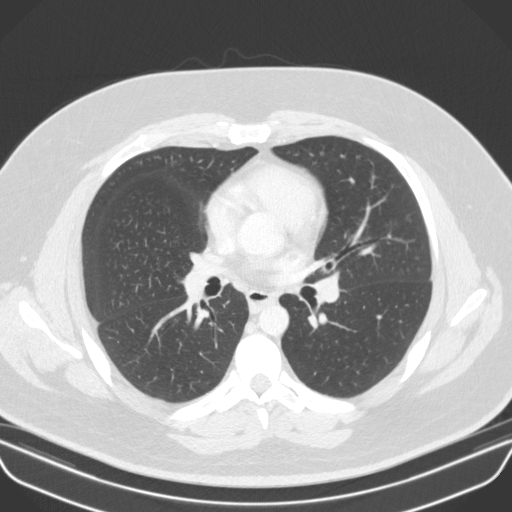
[im 110/157  lung]
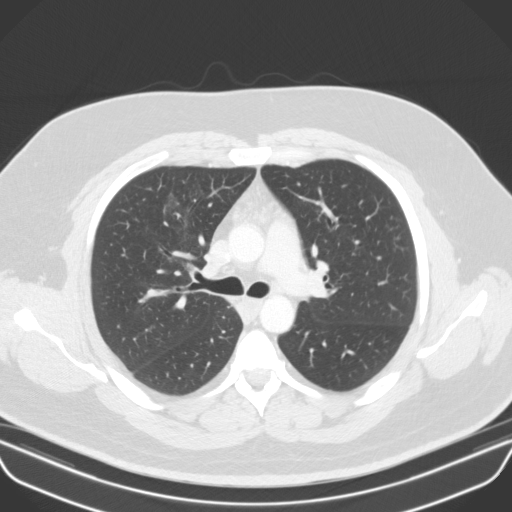
[im 118/157  lung]
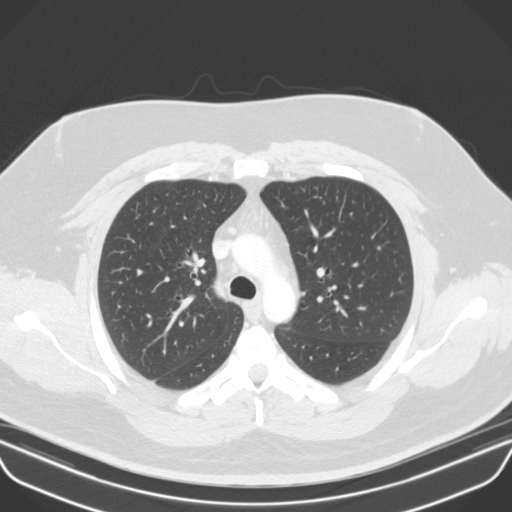
[im 125/157  mediastinal]
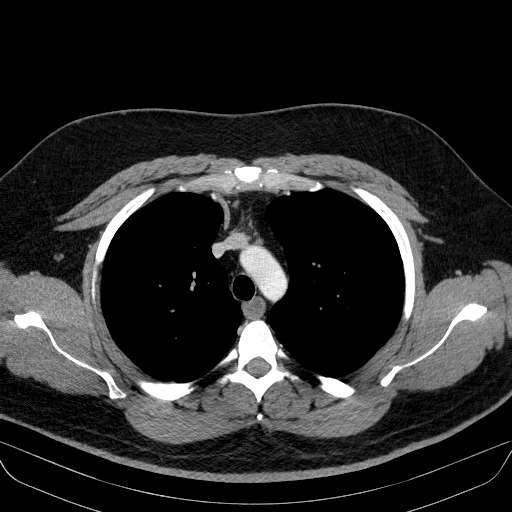
[im 125/157  lung]
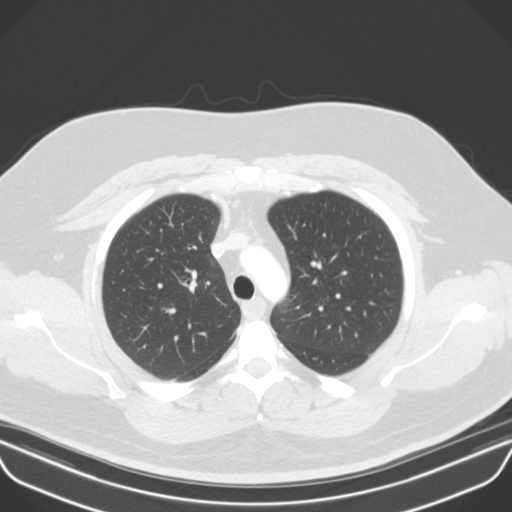
[im 141/157  lung]
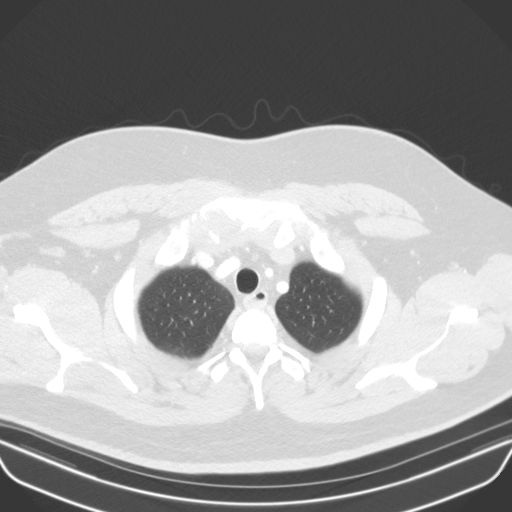
[im 149/157  lung]
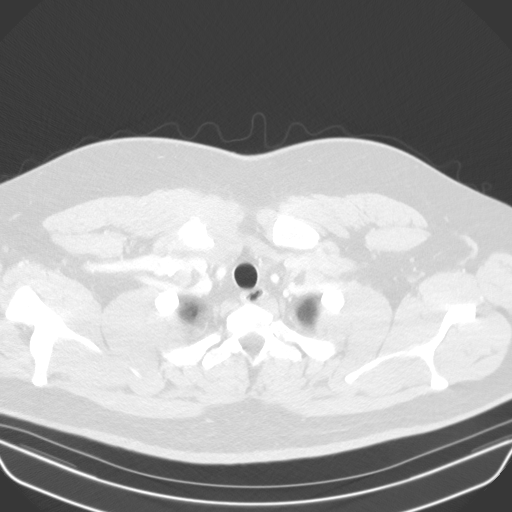

[15 of 29 positions shown; findings below may reference images not displayed]

FINDINGS: Cardiovascular: Normal heart size. No pericardial effusion.
Unremarkable great vessels.

Mediastinum/Nodes: Stable appearance of residual thymus with
heterogeneous density.

Lungs/Pleura: No evidence of pleural or pulmonary infection.

Upper Abdomen: Negative

Musculoskeletal: Interval excision of the right sternoclavicular
joint. The osteotomy margins appear fairly discrete for surgery
timing. There is still regional soft tissue and fluid density
encompassing the joint with a track like structure extending towards
the skin surface, patency uncertain. No regional fat stranding.
Low-density in the lower right sternocleidomastoid is no longer
seen.
IMPRESSION: Phlegmonous appearance at the postoperative right sternoclavicular
joint with probable pockets of fluid that is contiguous with a track
like structure extending to the ventral skin surface. Osteotomy
margins appear fairly defined, no convincing active osteomyelitis.
No mediastinitis.

## 2019-09-12 MED ORDER — IOPAMIDOL (ISOVUE-300) INJECTION 61%
75.0000 mL | Freq: Once | INTRAVENOUS | Status: AC | PRN
  Administered 2019-09-12: 75 mL via INTRAVENOUS

## 2019-09-17 ENCOUNTER — Other Ambulatory Visit: Payer: Self-pay | Admitting: Cardiothoracic Surgery

## 2019-09-17 ENCOUNTER — Other Ambulatory Visit: Payer: Self-pay

## 2019-09-17 ENCOUNTER — Ambulatory Visit (INDEPENDENT_AMBULATORY_CARE_PROVIDER_SITE_OTHER): Payer: BC Managed Care – PPO | Admitting: Cardiothoracic Surgery

## 2019-09-17 ENCOUNTER — Other Ambulatory Visit: Payer: Self-pay | Admitting: *Deleted

## 2019-09-17 ENCOUNTER — Other Ambulatory Visit (HOSPITAL_COMMUNITY)
Admission: RE | Admit: 2019-09-17 | Discharge: 2019-09-17 | Disposition: A | Discharge status: Home / Self Care | Payer: BC Managed Care – PPO | Source: Ambulatory Visit | Attending: Cardiothoracic Surgery | Admitting: Cardiothoracic Surgery

## 2019-09-17 ENCOUNTER — Encounter: Payer: Self-pay | Admitting: Cardiothoracic Surgery

## 2019-09-17 ENCOUNTER — Ambulatory Visit: Payer: BC Managed Care – PPO | Admitting: Cardiothoracic Surgery

## 2019-09-17 VITALS — BP 122/86 | HR 122 | Temp 98.3°F | Resp 20 | Ht 72.0 in | Wt 279.8 lb

## 2019-09-17 DIAGNOSIS — M25511 Pain in right shoulder: Secondary | ICD-10-CM

## 2019-09-17 DIAGNOSIS — Z5189 Encounter for other specified aftercare: Secondary | ICD-10-CM

## 2019-09-17 DIAGNOSIS — M009 Pyogenic arthritis, unspecified: Secondary | ICD-10-CM | POA: Diagnosis not present

## 2019-09-17 DIAGNOSIS — Z4800 Encounter for change or removal of nonsurgical wound dressing: Secondary | ICD-10-CM

## 2019-09-17 DIAGNOSIS — Z01812 Encounter for preprocedural laboratory examination: Secondary | ICD-10-CM | POA: Insufficient documentation

## 2019-09-17 DIAGNOSIS — Z20822 Contact with and (suspected) exposure to covid-19: Secondary | ICD-10-CM | POA: Insufficient documentation

## 2019-09-17 LAB — SARS CORONAVIRUS 2 (TAT 6-24 HRS): SARS Coronavirus 2: NEGATIVE

## 2019-09-17 NOTE — Progress Notes (Signed)
PCP is Ignatius Specking, MD Referring Provider is Ignatius Specking, MD  Chief Complaint  Patient presents with  . Wound Check    sternoclavicular wound dressing change    HPI: The patient presents for evaluation of a persistent draining sinus in the region of a incision 3 months ago for I&D of right sternoclavicular joint infection.  This has been cultured positive for MSSA.  The drainage is sometimes serosanguineous.  There is no associated fever or systemic symptoms.  A CT scan of the chest demonstrated a phlegmon of fluid and nonhealthy tissue measuring 4 x 3 cm in the region of the sternoclavicular joint which does not communicate with the skin sinus tract which has been packed daily.  The sternal and clavicular bone appear to be healthy without osteomyelitis.  The patient will be prepared for expiration of the sinus tract to open direct medication to the phlegmon so it can be debrided irrigated and packed.  Patient is also having more pain involving the right shoulder acromioclavicular joint and diffuse discomfort with movement.  An MRI of the shoulder will be obtained.  The patient has been placed on oral Keflex which will be renewed.  Past Medical History:  Diagnosis Date  . Anemia   . Arthritis    rheumatoid  . Depression   . GERD (gastroesophageal reflux disease)   . Hypertension 2019  . Ischemic colitis (HCC) 09/2008  . Tachycardia     Past Surgical History:  Procedure Laterality Date  . APPENDECTOMY    . APPLICATION OF WOUND VAC Right 06/20/2019   Procedure: APPLICATION OF WOUND VAC;  Surgeon: [name], MD;  Location: [facility] OR;  Service: Vascular;  Laterality: Right;  . APPLICATION OF WOUND VAC Right 06/23/2019   Procedure: APPLICATION OF WOUND VAC;  Surgeon: [name], MD;  Location: [facility] OR;  Service: Thoracic;  Laterality: Right;  . I & D EXTREMITY Right 05/31/2019   Procedure: IRRIGATION AND DEBRIDEMENT LOWER EXTREMITY;  Surgeon: Tarry Kos, MD;  Location: [facility] OR;   Service: Orthopedics;  Laterality: Right;  . I & D EXTREMITY Right 06/20/2019   Procedure: DEBRIDEMENT OF STERNOCLAVICULAR JOINT ABSCESS;  Surgeon: [name], MD;  Location: [facility] OR;  Service: Vascular;  Laterality: Right;  . TEE WITHOUT CARDIOVERSION N/A 06/05/2019   Procedure: TRANSESOPHAGEAL ECHOCARDIOGRAM (TEE);  Surgeon: Lewayne Bunting, MD;  Location: [facility] ENDOSCOPY;  Service: Cardiovascular;  Laterality: N/A;  . WOUND EXPLORATION Right 06/23/2019   Procedure: irrigation and clean out right shoulder;  Surgeon: [name], MD;  Location: [facility] OR;  Service: Thoracic;  Laterality: Right;    Family History  Problem Relation Age of Onset  . Alcohol abuse Mother   . Depression Mother   . Drug abuse Mother   . Physical abuse Mother   . Sexual abuse Mother   . ADD / ADHD Paternal Aunt   . Alcohol abuse Maternal Grandfather   . Bipolar disorder Maternal Grandmother   . Schizophrenia Maternal Grandmother   . Drug abuse Maternal Grandmother   . Alcohol abuse Paternal Grandfather   . Depression Paternal Grandfather   . Depression Paternal Grandmother   . Drug abuse Paternal Grandmother     Social History Social History   Tobacco Use  . Smoking status: Current Every Day Smoker    Packs/day: 0.50    Years: 11.00    Pack years: 5.50    Types: Cigarettes  . Smokeless tobacco: Never Used  Vaping Use  .  Vaping Use: Never used  Substance Use Topics  . Alcohol use: No    Comment: one beer every 2-3 months  . Drug use: No    Current Outpatient Medications  Medication Sig Dispense Refill  . atorvastatin (LIPITOR) 10 MG tablet Take 10 mg by mouth at bedtime.   12  . cloNIDine (CATAPRES) 0.2 MG tablet Take 1 tablet (0.2 mg total) by mouth 3 (three) times daily. 270 tablet 0  . cyclobenzaprine (FLEXERIL) 10 MG tablet Take 1 tablet (10 mg total) by mouth 3 (three) times daily as needed for muscle spasms. 30 tablet 0  . esomeprazole (NEXIUM) 40 MG capsule Take 40 mg by mouth daily.     [name] lisinopril (PRINIVIL,ZESTRIL) 10 MG tablet Take 10 mg by mouth daily.    [name] lithium carbonate (ESKALITH) 450 MG CR tablet Take 1 tablet (450 mg total) by mouth at bedtime. Take with 300 mg to equal 750 mg at bedtime 90 tablet 0  . lithium carbonate (LITHOBID) 300 MG CR tablet Take total of 750 mg at night along with 400 mg tab 90 tablet 0  . Multiple Vitamin (MULTIVITAMIN WITH MINERALS) TABS tablet Take 1 tablet by mouth daily.    . risperidone (RISPERDAL) 4 MG tablet Take 1 tablet (4 mg total) by mouth at bedtime. 90 tablet 0  . sertraline (ZOLOFT) 100 MG tablet Take 1 tablet (100 mg total) by mouth daily. (Patient taking differently: Take 150 mg by mouth daily. ) 90 tablet 0  . traZODone (DESYREL) 150 MG tablet Take 1 tablet (150 mg total) by mouth at bedtime. 90 tablet 0   No current facility-administered medications for this visit.    No Known Allergies  Review of Systems   No fever diaphoresis No shortness of breath or chest pain Right shoulder discomfort with rotation of the right arm or elevation of the right arm No abdominal pain or change in bowel habits No lower extremity edema or tenderness No dizziness or change in vision No dental discomfort   BP 122/86 (BP Location: Left Arm, Patient Position: Sitting, Cuff Size: Large)   Pulse (!) 122   Temp 98.3 F (36.8 C)   Resp 20   Ht 6' (1.829 m)   Wt 279 lb 12.8 oz (126.9 kg)   SpO2 95% Comment: RA  BMI 37.95 kg/m  Physical Exam      Exam    General- alert and comfortable    Neck- no JVD, no cervical adenopathy palpable, no carotid bruit   Lungs- clear without rales, wheezes.  Mucopurulent drainage from the sinus tract in the center of the previous surgical incision for I&D of the right sternoclavicular joint space infection   Cor- regular rate and rhythm, no murmur , gallop   Abdomen- soft, non-tender   Extremities - warm, non-tender, minimal edema   Neuro- oriented, appropriate, no focal  weakness   Diagnostic Tests: Most recent CT scan of the chest images personally reviewed showing the phlegmon in the area of the right sternoclavicular joint not in communication with a sinus tract to the skin  Impression: Some indolent persistent focus of infection-inflammation in the right sternoclavicular joint which cannot be drained through the skin sinus tract.  The patient will need outpatient surgery under general anesthesia to open the sinus tract so that the indolent phlegmon can be debrided irrigated and packed and to allow the wound to heal from the base up to the skin.  Plan: Right shoulder MRI to assess his shoulder  pain symptoms which may be referred from his wound  Patient will be prepared for outpatient surgery on August 6 under general anesthesia for exploration of sinus tract and debridement of sternoclavicular joint space phlegmon.  I discussed the benefits of the procedure as well as the risk of bleeding pain and recurrent infection.  [name], MD [facility] [phone]

## 2019-09-18 ENCOUNTER — Other Ambulatory Visit: Payer: Self-pay

## 2019-09-18 ENCOUNTER — Ambulatory Visit
Admission: RE | Admit: 2019-09-18 | Discharge: 2019-09-18 | Disposition: A | Discharge status: Home / Self Care | Payer: BC Managed Care – PPO | Source: Ambulatory Visit | Attending: Cardiothoracic Surgery | Admitting: Cardiothoracic Surgery

## 2019-09-18 ENCOUNTER — Encounter (HOSPITAL_COMMUNITY): Payer: Self-pay | Admitting: Cardiothoracic Surgery

## 2019-09-18 DIAGNOSIS — Z5189 Encounter for other specified aftercare: Secondary | ICD-10-CM

## 2019-09-18 IMAGING — MR MR SHOULDER*R* WO/W CM
5 of 9 series · 17 of 40 positions shown · IV contrast (with contrast)
Comparison: None.

CLINICAL DATA: Shoulder pain with clinical concern for infection.
History of right sternoclavicular septic arthritis

EXAM:
MRI OF THE RIGHT SHOULDER WITHOUT AND WITH CONTRAST
TECHNIQUE: Multiplanar, multisequence MR imaging of the right shoulder was
performed before and after the administration of intravenous
contrast.
CONTRAST:  20mL MULTIHANCE GADOBENATE DIMEGLUMINE 529 MG/ML IV SOLN

[Series 6: PD fat-sat · axial · right · 4.0mm · 0.23mm/px · z∈[-42,+47]mm · 4 of 20 slices shown (1 of 2)]
[im 1/20]
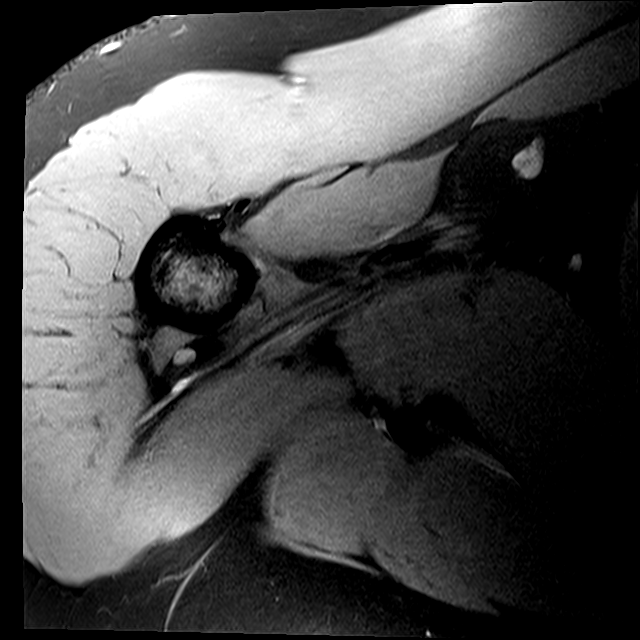
[im 7/20]
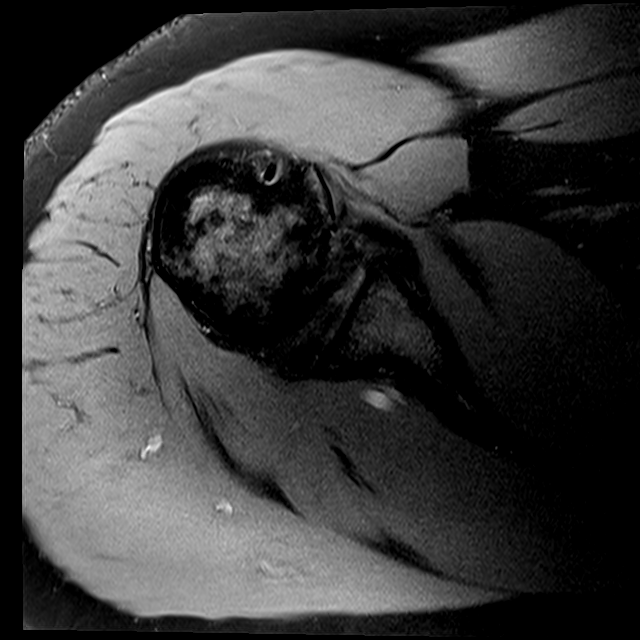
[im 13/20]
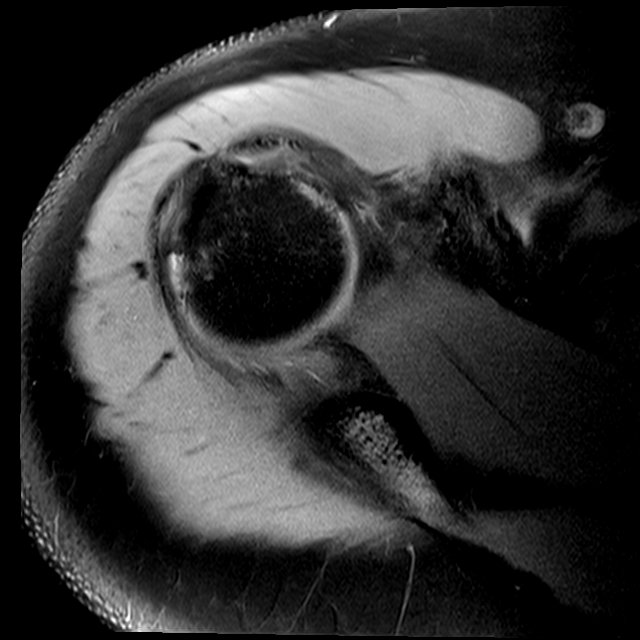
[im 20/20]
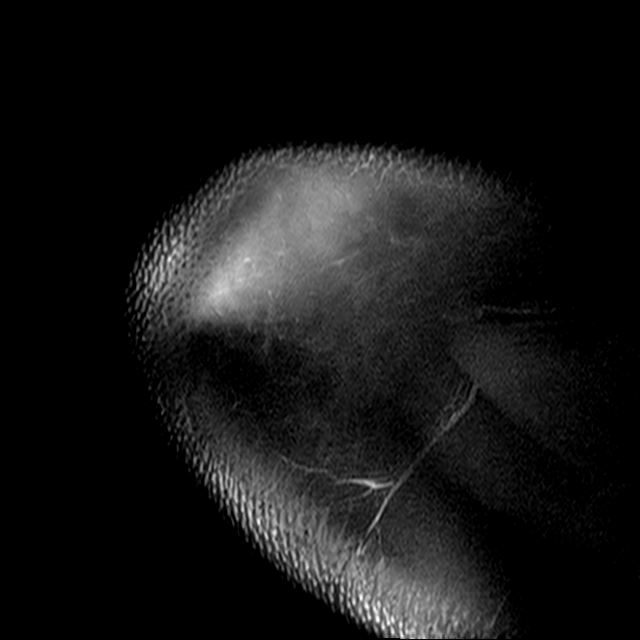

[Series 7: T2 fat-sat · oblique · right · 4.0mm · 0.22mm/px · 3 of 19 slices shown (1 of 2)]
[im 1/19]
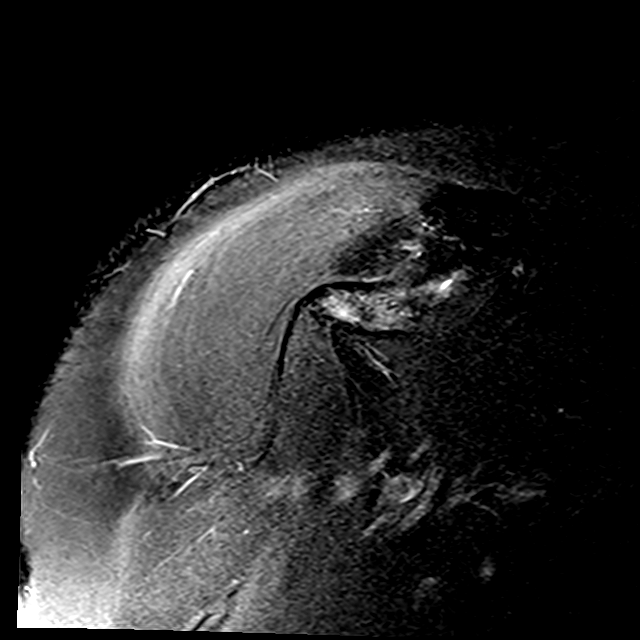
[im 10/19]
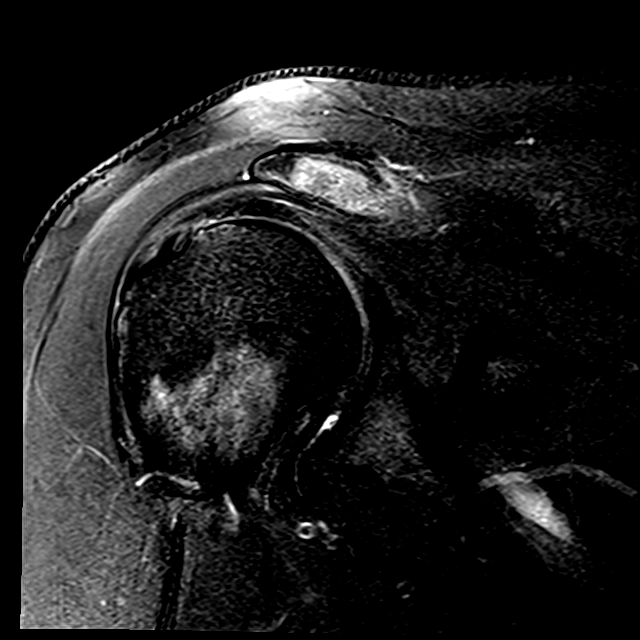
[im 19/19]
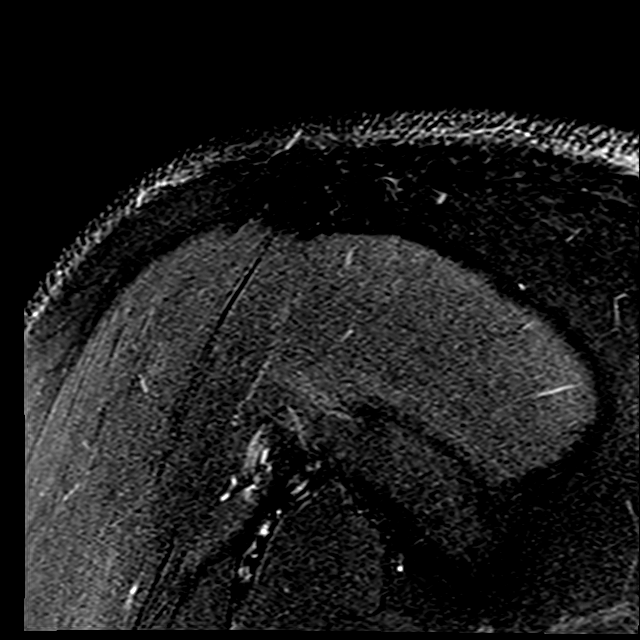

[Series 8: PD fat-sat · oblique · right · 4.0mm · 0.27mm/px · 4 of 19 slices shown (2 of 2)]
[im 1/19]
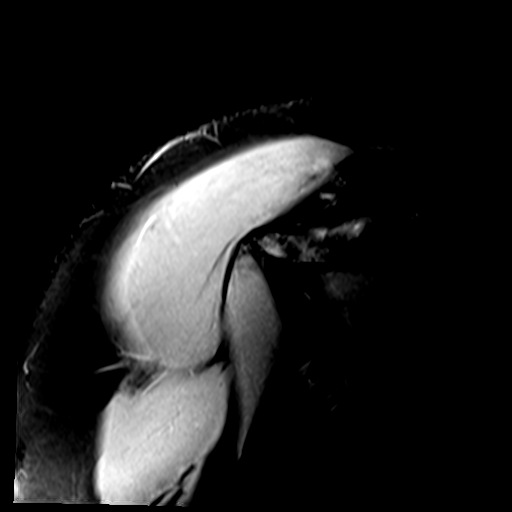
[im 7/19]
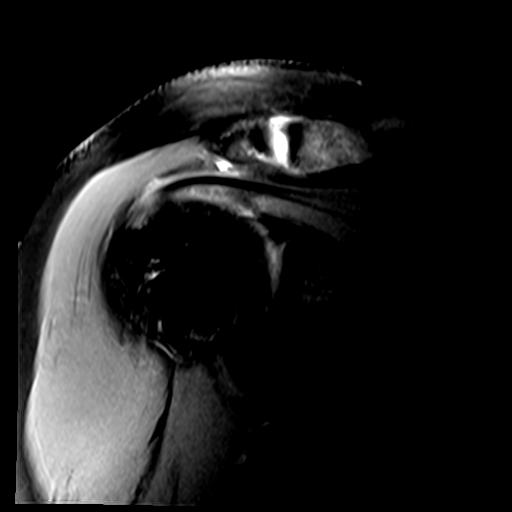
[im 13/19]
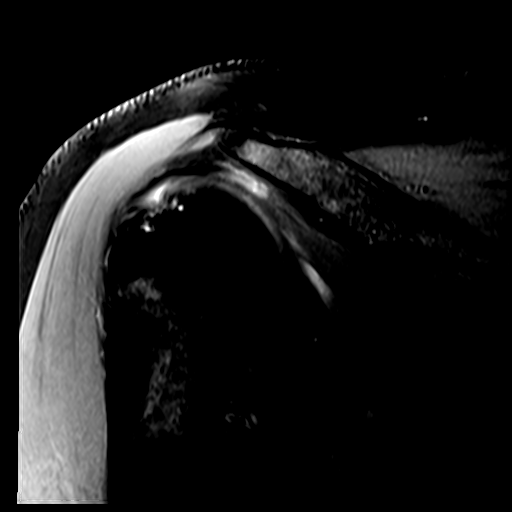
[im 19/19]
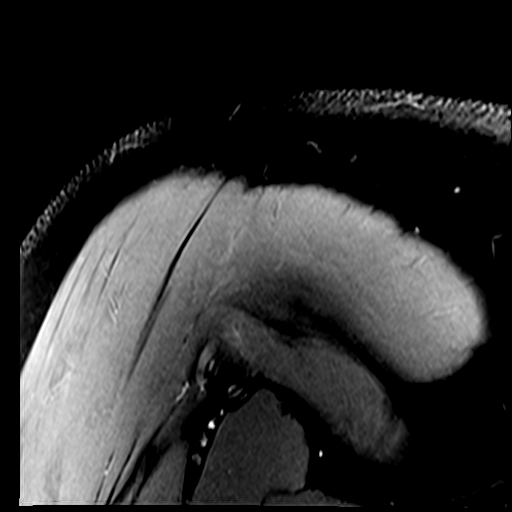

[Series 9: T2 fat-sat · oblique · right · 4.0mm · 0.44mm/px · 5 of 20 slices shown (2 of 2)]
[im 1/20]
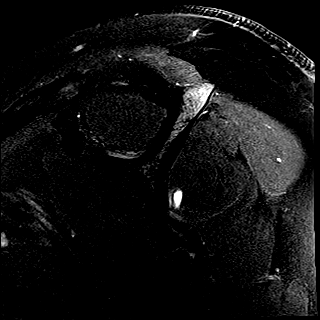
[im 5/20]
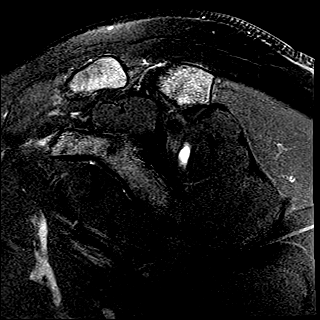
[im 10/20]
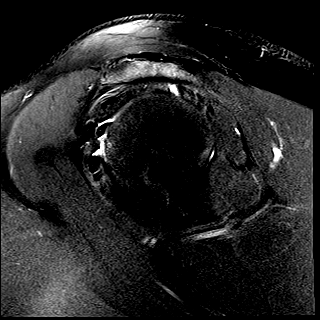
[im 15/20]
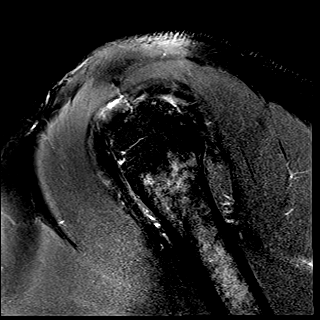
[im 20/20]
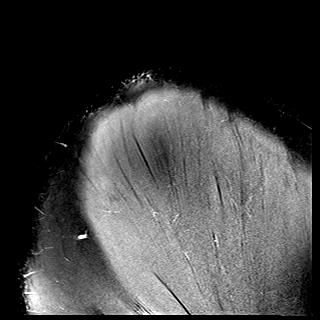

[Series 10: T1 · oblique · right · 4.0mm · 0.18mm/px · 1 of 20 slices shown]
[im 1/20]
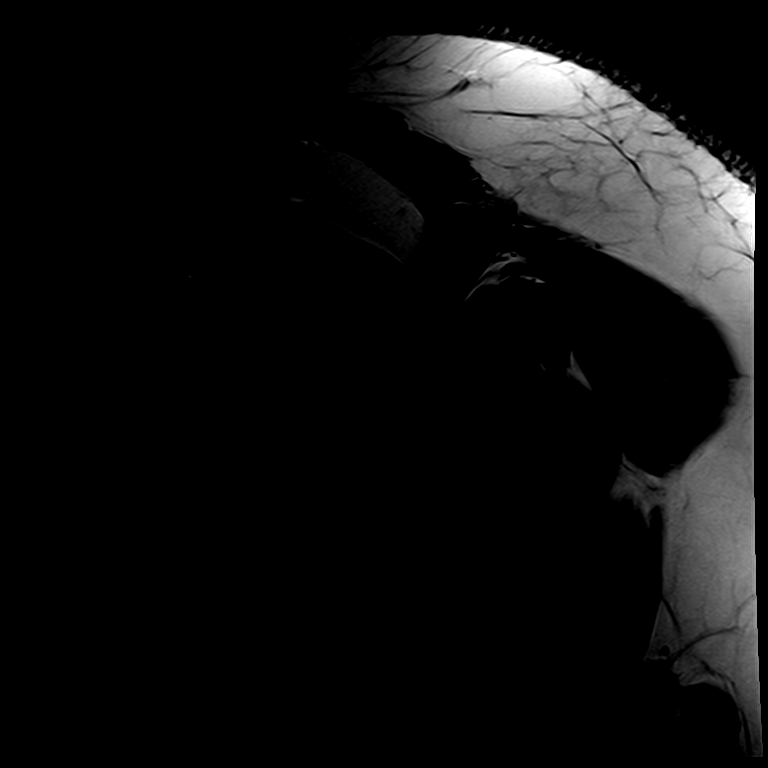

[17 of 40 positions shown; findings below may reference images not displayed]

FINDINGS: Rotator cuff: Focal interstitial tear of the posterior infraspinatus
tendon with fluid dissecting medially into the infraspinatus muscle
belly (series 7, image 7). Mild supraspinatus and infraspinatus
tendinosis. Subscapularis and teres minor tendons intact.

Muscles: No atrophy or abnormal signal of the muscles of the rotator
cuff.

Biceps long head:  Intact.

Acromioclavicular Joint: No significant arthropathy of the AC joint.
There is a small AC joint effusion with capsular enhancement (series
13, image 12). No significant surrounding soft tissue edema or
fluid. Trace subacromial-subdeltoid bursal fluid.

Glenohumeral Joint: No joint effusion. No chondral defect. Small
marginal humeral head osteophytosis.

Labrum: Grossly intact, but evaluation is limited by lack of
intraarticular fluid.

Bones: No acute fracture. No dislocation. No bone marrow edema. No
bony erosion. No suspicious bone lesion.

Other: No soft tissue fluid collections. No visible axillary
lymphadenopathy.
IMPRESSION: 1. Small right acromioclavicular joint effusion with capsular
enhancement. Early septic arthritis is a consideration given the
clinical concern for infection. There is no associated cortical
destruction or marrow edema to suggest osteomyelitis. No surrounding
soft tissue edema or fluid collection.
2. No evidence of septic arthritis of the right glenohumeral joint.
3. Trace subacromial-subdeltoid bursitis.
4. Focal low-grade interstitial tear of the posterior infraspinatus
tendon with fluid dissecting medially into the infraspinatus muscle
belly.
5. Mild supraspinatus and infraspinatus tendinosis.

These results will be called to the ordering clinician or
representative by the Radiologist Assistant, and communication
documented in the PACS or [REDACTED].

## 2019-09-18 MED ORDER — GADOBENATE DIMEGLUMINE 529 MG/ML IV SOLN
20.0000 mL | Freq: Once | INTRAVENOUS | Status: AC | PRN
  Administered 2019-09-18: 20 mL via INTRAVENOUS

## 2019-09-18 MED ORDER — CEPHALEXIN 500 MG PO CAPS
500.0000 mg | ORAL_CAPSULE | Freq: Three times a day (TID) | ORAL | 0 refills | Status: DC
Start: 2019-09-18 — End: 2019-09-24

## 2019-09-18 MED ORDER — DEXTROSE 5 % IV SOLN
3.0000 g | INTRAVENOUS | Status: AC
  Administered 2019-09-19: 3 g via INTRAVENOUS
  Filled 2019-09-18: qty 3

## 2019-09-18 NOTE — Progress Notes (Signed)
Herbert Walsh denies chest pain or shortness of breath.  Patient tested negative for Covid_8/4/21_ and has been in quarantine since that time.  Herbert Walsh reports that chest wound is not draining now, he does keep a dressing on it. I instructed patient to not take anymore vitamins until after surgery.

## 2019-09-18 NOTE — Anesthesia Preprocedure Evaluation (Addendum)
Anesthesia Evaluation  Patient identified by MRN, date of birth, ID band Patient awake    Reviewed: Allergy & Precautions, H&P , NPO status , Patient's Chart, lab work & pertinent test results  Airway Mallampati: II  TM Distance: >3 FB Neck ROM: Full    Dental no notable dental hx. (+) Poor Dentition, Dental Advisory Given   Pulmonary asthma , Current Smoker and Patient abstained from smoking.,    Pulmonary exam normal breath sounds clear to auscultation       Cardiovascular Exercise Tolerance: Good hypertension, Pt. on medications  Rhythm:Regular Rate:Normal     Neuro/Psych Anxiety Depression Bipolar Disorder negative neurological ROS     GI/Hepatic Neg liver ROS, GERD  Medicated,  Endo/Other  negative endocrine ROS  Renal/GU negative Renal ROS  negative genitourinary   Musculoskeletal   Abdominal   Peds  Hematology  (+) Blood dyscrasia, anemia ,   Anesthesia Other Findings   Reproductive/Obstetrics negative OB ROS                            Anesthesia Physical Anesthesia Plan  ASA: III  Anesthesia Plan: General   Post-op Pain Management:    Induction: Intravenous  PONV Risk Score and Plan: 2 and Ondansetron, Dexamethasone and Midazolam  Airway Management Planned: Oral ETT  Additional Equipment:   Intra-op Plan:   Post-operative Plan: Extubation in OR  Informed Consent: I have reviewed the patients History and Physical, chart, labs and discussed the procedure including the risks, benefits and alternatives for the proposed anesthesia with the patient or authorized representative who has indicated his/her understanding and acceptance.     Dental advisory given  Plan Discussed with: CRNA  Anesthesia Plan Comments:        Anesthesia Quick Evaluation

## 2019-09-19 ENCOUNTER — Ambulatory Visit (HOSPITAL_COMMUNITY)
Admission: RE | Admit: 2019-09-19 | Discharge: 2019-09-19 | Disposition: A | Discharge status: Home / Self Care | Payer: BC Managed Care – PPO | Source: Ambulatory Visit | Attending: Cardiothoracic Surgery | Admitting: Cardiothoracic Surgery

## 2019-09-19 ENCOUNTER — Ambulatory Visit (HOSPITAL_COMMUNITY): Payer: BC Managed Care – PPO

## 2019-09-19 ENCOUNTER — Encounter (HOSPITAL_COMMUNITY)
Admission: RE | Disposition: A | Discharge status: Home / Self Care | Payer: Self-pay | Source: Ambulatory Visit | Attending: Cardiothoracic Surgery

## 2019-09-19 ENCOUNTER — Other Ambulatory Visit: Payer: Self-pay

## 2019-09-19 ENCOUNTER — Ambulatory Visit (HOSPITAL_COMMUNITY): Payer: BC Managed Care – PPO | Admitting: Certified Registered"

## 2019-09-19 ENCOUNTER — Encounter (HOSPITAL_COMMUNITY): Payer: Self-pay | Admitting: Cardiothoracic Surgery

## 2019-09-19 DIAGNOSIS — B9561 Methicillin susceptible Staphylococcus aureus infection as the cause of diseases classified elsewhere: Secondary | ICD-10-CM | POA: Diagnosis not present

## 2019-09-19 DIAGNOSIS — S21109A Unspecified open wound of unspecified front wall of thorax without penetration into thoracic cavity, initial encounter: Secondary | ICD-10-CM

## 2019-09-19 DIAGNOSIS — Y838 Other surgical procedures as the cause of abnormal reaction of the patient, or of later complication, without mention of misadventure at the time of the procedure: Secondary | ICD-10-CM | POA: Diagnosis not present

## 2019-09-19 DIAGNOSIS — T8189XA Other complications of procedures, not elsewhere classified, initial encounter: Secondary | ICD-10-CM | POA: Insufficient documentation

## 2019-09-19 DIAGNOSIS — I10 Essential (primary) hypertension: Secondary | ICD-10-CM | POA: Insufficient documentation

## 2019-09-19 DIAGNOSIS — D649 Anemia, unspecified: Secondary | ICD-10-CM | POA: Diagnosis not present

## 2019-09-19 DIAGNOSIS — K219 Gastro-esophageal reflux disease without esophagitis: Secondary | ICD-10-CM | POA: Diagnosis not present

## 2019-09-19 DIAGNOSIS — F17210 Nicotine dependence, cigarettes, uncomplicated: Secondary | ICD-10-CM | POA: Diagnosis not present

## 2019-09-19 DIAGNOSIS — M25511 Pain in right shoulder: Secondary | ICD-10-CM

## 2019-09-19 DIAGNOSIS — M869 Osteomyelitis, unspecified: Secondary | ICD-10-CM

## 2019-09-19 HISTORY — DX: Unspecified osteoarthritis, unspecified site: M19.90

## 2019-09-19 HISTORY — DX: Unspecified asthma, uncomplicated: J45.909

## 2019-09-19 HISTORY — DX: Post-traumatic stress disorder, unspecified: F43.10

## 2019-09-19 HISTORY — PX: STERNAL WOUND DEBRIDEMENT: SHX1058

## 2019-09-19 HISTORY — DX: Methicillin susceptible Staphylococcus aureus infection, unspecified site: A49.01

## 2019-09-19 LAB — CBC
HCT: 38.9 % — ABNORMAL LOW (ref 39.0–52.0)
Hemoglobin: 11.8 g/dL — ABNORMAL LOW (ref 13.0–17.0)
MCH: 23.5 pg — ABNORMAL LOW (ref 26.0–34.0)
MCHC: 30.3 g/dL (ref 30.0–36.0)
MCV: 77.5 fL — ABNORMAL LOW (ref 80.0–100.0)
Platelets: 307 10*3/uL (ref 150–400)
RBC: 5.02 MIL/uL (ref 4.22–5.81)
RDW: 16.1 % — ABNORMAL HIGH (ref 11.5–15.5)
WBC: 10.3 10*3/uL (ref 4.0–10.5)
nRBC: 0 % (ref 0.0–0.2)

## 2019-09-19 LAB — COMPREHENSIVE METABOLIC PANEL
ALT: 20 U/L (ref 0–44)
AST: 21 U/L (ref 15–41)
Albumin: 3.2 g/dL — ABNORMAL LOW (ref 3.5–5.0)
Alkaline Phosphatase: 102 U/L (ref 38–126)
Anion gap: 9 (ref 5–15)
BUN: 6 mg/dL (ref 6–20)
CO2: 24 mmol/L (ref 22–32)
Calcium: 9.4 mg/dL (ref 8.9–10.3)
Chloride: 102 mmol/L (ref 98–111)
Creatinine, Ser: 0.86 mg/dL (ref 0.61–1.24)
GFR calc Af Amer: >60 mL/min (ref >60)
GFR calc non Af Amer: >60 mL/min (ref >60)
Glucose, Bld: 161 mg/dL — ABNORMAL HIGH (ref 70–99)
Potassium: 3.9 mmol/L (ref 3.5–5.1)
Sodium: 135 mmol/L (ref 135–145)
Total Bilirubin: 0.3 mg/dL (ref 0.3–1.2)
Total Protein: 6.5 g/dL (ref 6.5–8.1)

## 2019-09-19 LAB — PREPARE RBC (CROSSMATCH)

## 2019-09-19 LAB — PROTIME-INR
INR: 1 (ref 0.8–1.2)
Prothrombin Time: 12.9 seconds (ref 11.4–15.2)

## 2019-09-19 LAB — APTT: aPTT: 30 seconds (ref 24–36)

## 2019-09-19 IMAGING — CR DG CHEST 1V PORT
1 series · 1 of 1 positions shown · non-contrast
Comparison: CT chest [DATE] and earlier.

CLINICAL DATA: 29-year-old male with right sternoclavicular septic
arthritis status post incision and debridement.

EXAM:
PORTABLE CHEST 1 VIEW

[AP]
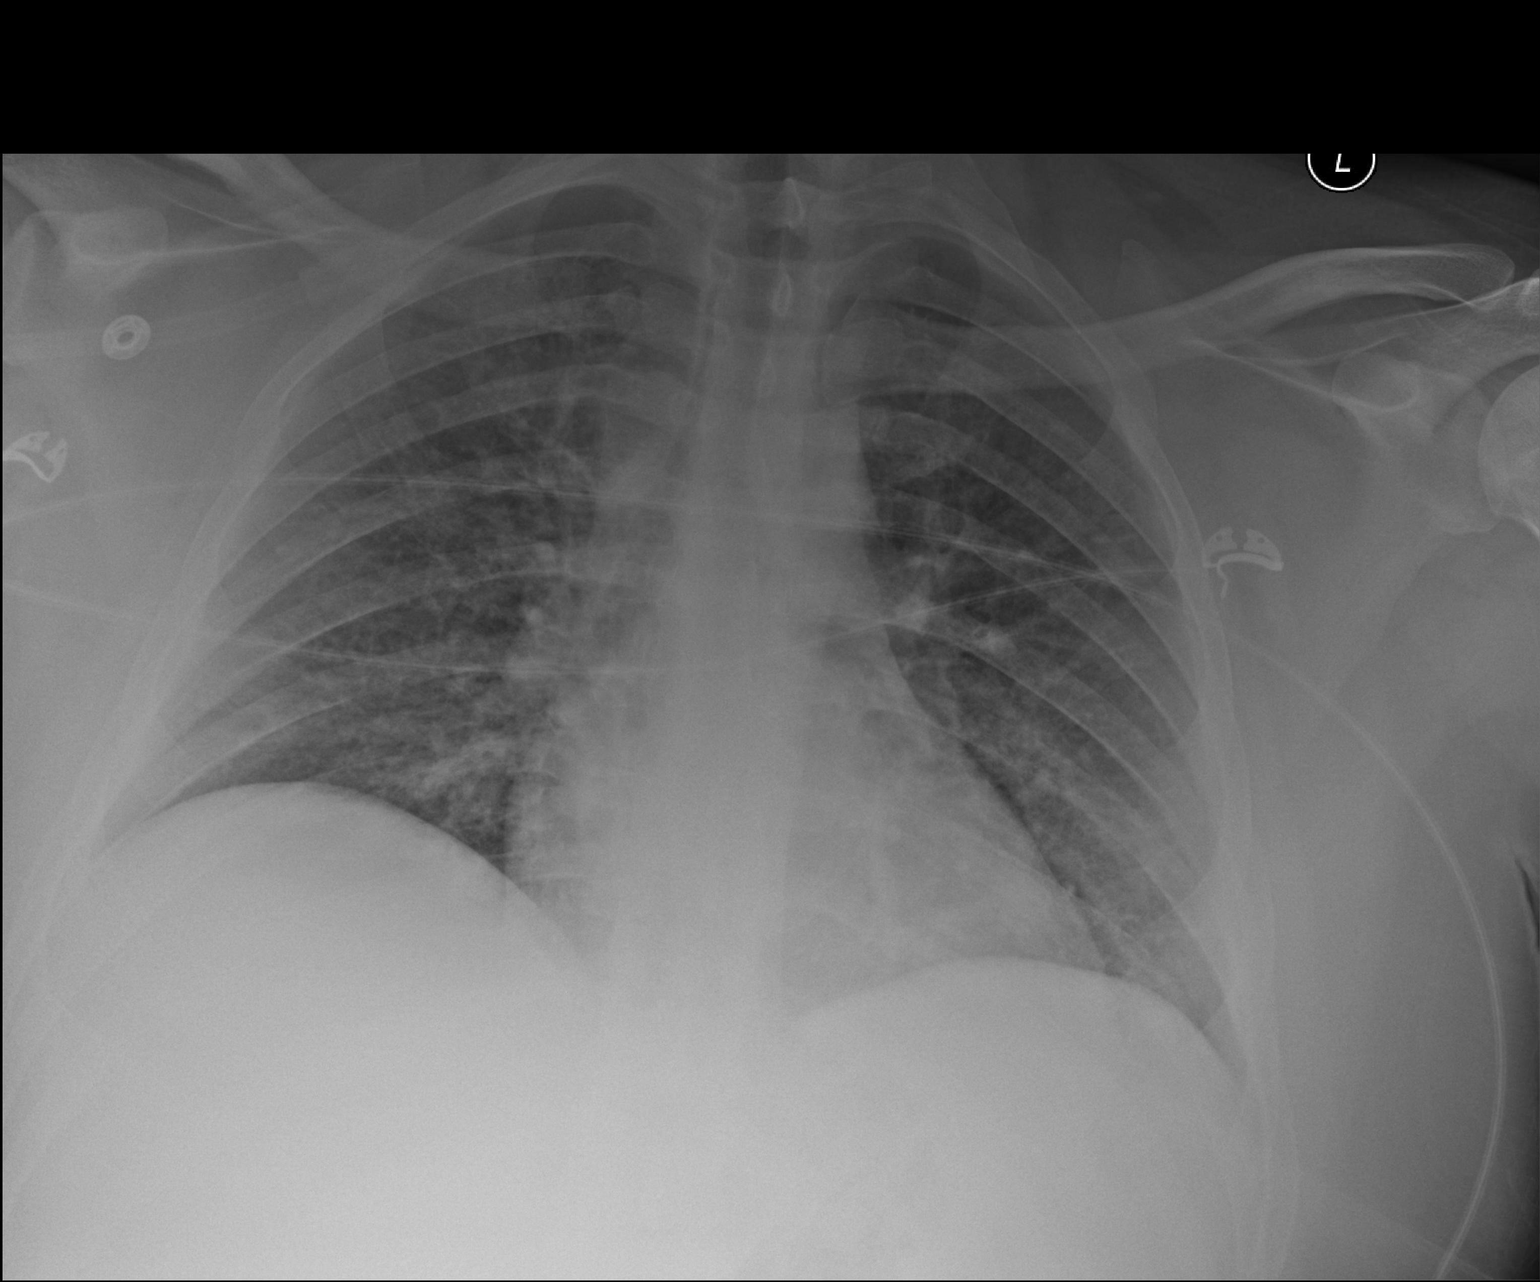

[1 of 1 positions shown; findings below may reference images not displayed]

FINDINGS: Portable AP upright view at [4N] hours. Erosion of the medial right
clavicle again noted, not definitely changed from the recent CT. Low
lung volumes. Mediastinal contours remain normal. Visualized
tracheal air column is within normal limits. Mild increased
pulmonary reticulonodular interstitial opacity is new [REDACTED]. No
pneumothorax, pleural effusion or consolidation. No other osseous
abnormality. Paucity of bowel gas in the upper abdomen.
IMPRESSION: 1. Lower lung volumes with nonspecific increased interstitial
markings [REDACTED], beyond that expected for atelectasis.
Viral/atypical respiratory infection is possible.
2. Eroded medial right clavicle, stable from recent Chest CT.

## 2019-09-19 SURGERY — DEBRIDEMENT, WOUND, STERNUM
Anesthesia: General | Site: Chest | Laterality: Right

## 2019-09-19 MED ORDER — LIDOCAINE 2% (20 MG/ML) 5 ML SYRINGE
INTRAMUSCULAR | Status: DC | PRN
  Administered 2019-09-19: 80 mg via INTRAVENOUS

## 2019-09-19 MED ORDER — ONDANSETRON HCL 4 MG/2ML IJ SOLN
INTRAMUSCULAR | Status: DC | PRN
  Administered 2019-09-19: 4 mg via INTRAVENOUS

## 2019-09-19 MED ORDER — EPHEDRINE SULFATE 50 MG/ML IJ SOLN
INTRAMUSCULAR | Status: DC | PRN
Start: 2019-09-19 — End: 2019-09-19
  Administered 2019-09-19: 5 mg via INTRAVENOUS
  Administered 2019-09-19 (×2): 10 mg via INTRAVENOUS

## 2019-09-19 MED ORDER — HEMOSTATIC AGENTS (NO CHARGE) OPTIME
TOPICAL | Status: DC | PRN
Start: 2019-09-19 — End: 2019-09-19
  Administered 2019-09-19: 1 via TOPICAL

## 2019-09-19 MED ORDER — ACETAMINOPHEN 500 MG PO TABS
1000.0000 mg | ORAL_TABLET | Freq: Once | ORAL | Status: AC
  Administered 2019-09-19: 1000 mg via ORAL
  Filled 2019-09-19: qty 2

## 2019-09-19 MED ORDER — PROPOFOL 10 MG/ML IV BOLUS
INTRAVENOUS | Status: DC | PRN
  Administered 2019-09-19: 200 mg via INTRAVENOUS

## 2019-09-19 MED ORDER — PROPOFOL 10 MG/ML IV BOLUS
INTRAVENOUS | Status: AC
  Filled 2019-09-19: qty 40

## 2019-09-19 MED ORDER — ROCURONIUM BROMIDE 10 MG/ML (PF) SYRINGE
PREFILLED_SYRINGE | INTRAVENOUS | Status: DC | PRN
  Administered 2019-09-19: 70 mg via INTRAVENOUS

## 2019-09-19 MED ORDER — HYDROMORPHONE HCL 1 MG/ML IJ SOLN: 0.2500 mg | INTRAMUSCULAR | Status: DC | PRN

## 2019-09-19 MED ORDER — VANCOMYCIN HCL 1000 MG IV SOLR
INTRAVENOUS | Status: DC | PRN
  Administered 2019-09-19: 1000 mL

## 2019-09-19 MED ORDER — SODIUM CHLORIDE 0.9% FLUSH: 3.0000 mL | Freq: Two times a day (BID) | INTRAVENOUS | Status: DC

## 2019-09-19 MED ORDER — CHLORHEXIDINE GLUCONATE 0.12 % MT SOLN
OROMUCOSAL | Status: AC
  Administered 2019-09-19: 15 mL
  Filled 2019-09-19: qty 15

## 2019-09-19 MED ORDER — MIDAZOLAM HCL 2 MG/2ML IJ SOLN
INTRAMUSCULAR | Status: AC
  Filled 2019-09-19: qty 2

## 2019-09-19 MED ORDER — SODIUM CHLORIDE 0.9% FLUSH: 3.0000 mL | INTRAVENOUS | Status: DC | PRN

## 2019-09-19 MED ORDER — SUGAMMADEX SODIUM 200 MG/2ML IV SOLN
INTRAVENOUS | Status: DC | PRN
  Administered 2019-09-19: 200 mg via INTRAVENOUS

## 2019-09-19 MED ORDER — VANCOMYCIN HCL 1000 MG IV SOLR
INTRAVENOUS | Status: AC
  Filled 2019-09-19: qty 1000

## 2019-09-19 MED ORDER — PHENYLEPHRINE HCL-NACL 10-0.9 MG/250ML-% IV SOLN
INTRAVENOUS | Status: DC | PRN
Start: 2019-09-19 — End: 2019-09-19
  Administered 2019-09-19: 25 ug/min via INTRAVENOUS

## 2019-09-19 MED ORDER — MIDAZOLAM HCL 5 MG/5ML IJ SOLN
INTRAMUSCULAR | Status: DC | PRN
  Administered 2019-09-19: 2 mg via INTRAVENOUS

## 2019-09-19 MED ORDER — SODIUM CHLORIDE 0.9 % IR SOLN
Status: DC | PRN
  Administered 2019-09-19: 2000 mL

## 2019-09-19 MED ORDER — ACETAMINOPHEN 650 MG RE SUPP: 650.0000 mg | RECTAL | Status: DC | PRN

## 2019-09-19 MED ORDER — ACETAMINOPHEN 325 MG PO TABS: 650.0000 mg | ORAL_TABLET | ORAL | Status: DC | PRN

## 2019-09-19 MED ORDER — LACTATED RINGERS IV SOLN: INTRAVENOUS | Status: DC | PRN

## 2019-09-19 MED ORDER — FENTANYL CITRATE (PF) 250 MCG/5ML IJ SOLN
INTRAMUSCULAR | Status: AC
  Filled 2019-09-19: qty 5

## 2019-09-19 MED ORDER — DEXAMETHASONE SODIUM PHOSPHATE 10 MG/ML IJ SOLN
INTRAMUSCULAR | Status: DC | PRN
  Administered 2019-09-19: 5 mg via INTRAVENOUS

## 2019-09-19 MED ORDER — SODIUM CHLORIDE 0.9 % IV SOLN: 250.0000 mL | INTRAVENOUS | Status: DC | PRN

## 2019-09-19 MED ORDER — OXYCODONE HCL 5 MG PO TABS: 5.0000 mg | ORAL_TABLET | ORAL | Status: DC | PRN

## 2019-09-19 MED ORDER — OXYCODONE HCL 5 MG PO TABS
ORAL_TABLET | ORAL | Status: AC
  Administered 2019-09-19: 10 mg via ORAL
  Filled 2019-09-19: qty 2

## 2019-09-19 MED ORDER — PHENYLEPHRINE HCL (PRESSORS) 10 MG/ML IV SOLN
INTRAVENOUS | Status: DC | PRN
  Administered 2019-09-19: 80 ug via INTRAVENOUS
  Administered 2019-09-19 (×3): 120 ug via INTRAVENOUS

## 2019-09-19 MED ORDER — FENTANYL CITRATE (PF) 250 MCG/5ML IJ SOLN
INTRAMUSCULAR | Status: DC | PRN
  Administered 2019-09-19: 100 ug via INTRAVENOUS

## 2019-09-19 MED ORDER — DAKINS (1/4 STRENGTH) 0.125 % EX SOLN
CUTANEOUS | Status: AC
  Administered 2019-09-19: 1
  Filled 2019-09-19 (×2): qty 473

## 2019-09-19 SURGICAL SUPPLY — 69 items
APL SKNCLS STERI-STRIP NONHPOA (GAUZE/BANDAGES/DRESSINGS)
ATTRACTOMAT 16X20 MAGNETIC DRP (DRAPES) ×1 IMPLANT
BAG DECANTER FOR FLEXI CONT (MISCELLANEOUS) ×1 IMPLANT
BENZOIN TINCTURE PRP APPL 2/3 (GAUZE/BANDAGES/DRESSINGS) IMPLANT
BLADE CLIPPER SURG (BLADE) ×3 IMPLANT
BLADE SURG 10 STRL SS (BLADE) ×1 IMPLANT
BNDG GAUZE ELAST 4 BULKY (GAUZE/BANDAGES/DRESSINGS) IMPLANT
CANISTER SUCT 3000ML PPV (MISCELLANEOUS) ×3 IMPLANT
CATH FOLEY 2WAY SLVR  5CC 16FR (CATHETERS)
CATH FOLEY 2WAY SLVR 5CC 16FR (CATHETERS) IMPLANT
CATH THORACIC 28FR RT ANG (CATHETERS) IMPLANT
CATH THORACIC 36FR (CATHETERS) IMPLANT
CLIP VESOCCLUDE SM WIDE 24/CT (CLIP) IMPLANT
CNTNR URN SCR LID CUP LEK RST (MISCELLANEOUS) IMPLANT
CONN Y 3/8X3/8X3/8  BEN (MISCELLANEOUS)
CONN Y 3/8X3/8X3/8 BEN (MISCELLANEOUS) IMPLANT
CONT SPEC 4OZ STRL OR WHT (MISCELLANEOUS) ×3
COVER SURGICAL LIGHT HANDLE (MISCELLANEOUS) ×2 IMPLANT
DRAPE LAPAROSCOPIC ABDOMINAL (DRAPES) ×3 IMPLANT
DRAPE SURG 17X23 STRL (DRAPES) ×2 IMPLANT
DRAPE WARM FLUID 44X44 (DRAPES) IMPLANT
DRSG AQUACEL AG ADV 3.5X14 (GAUZE/BANDAGES/DRESSINGS) ×1 IMPLANT
DRSG PAD ABDOMINAL 8X10 ST (GAUZE/BANDAGES/DRESSINGS) IMPLANT
DRSG TEGADERM 4X4.75 (GAUZE/BANDAGES/DRESSINGS) ×4 IMPLANT
ELECT BLADE 4.0 EZ CLEAN MEGAD (MISCELLANEOUS) ×3
ELECT REM PT RETURN 9FT ADLT (ELECTROSURGICAL) ×3
ELECTRODE BLDE 4.0 EZ CLN MEGD (MISCELLANEOUS) IMPLANT
ELECTRODE REM PT RTRN 9FT ADLT (ELECTROSURGICAL) ×1 IMPLANT
GAUZE SPONGE 2X2 8PLY STRL LF (GAUZE/BANDAGES/DRESSINGS) IMPLANT
GAUZE SPONGE 4X4 12PLY STRL (GAUZE/BANDAGES/DRESSINGS) ×3 IMPLANT
GAUZE XEROFORM 5X9 LF (GAUZE/BANDAGES/DRESSINGS) IMPLANT
GLOVE BIO SURGEON STRL SZ 6.5 (GLOVE) ×2 IMPLANT
GLOVE BIO SURGEON STRL SZ7.5 (GLOVE) ×3 IMPLANT
GLOVE BIO SURGEONS STRL SZ 6.5 (GLOVE) ×2
GLOVE BIOGEL PI IND STRL 6.5 (GLOVE) IMPLANT
GLOVE BIOGEL PI IND STRL 7.5 (GLOVE) IMPLANT
GLOVE BIOGEL PI INDICATOR 6.5 (GLOVE) ×2
GLOVE BIOGEL PI INDICATOR 7.5 (GLOVE) ×2
GLOVE SURG SS PI 7.0 STRL IVOR (GLOVE) ×2 IMPLANT
GOWN STRL REUS W/ TWL LRG LVL3 (GOWN DISPOSABLE) ×4 IMPLANT
GOWN STRL REUS W/TWL LRG LVL3 (GOWN DISPOSABLE) ×9
HANDPIECE INTERPULSE COAX TIP (DISPOSABLE) ×3
HEMOSTAT POWDER SURGIFOAM 1G (HEMOSTASIS) IMPLANT
HEMOSTAT SURGICEL 2X14 (HEMOSTASIS) IMPLANT
KIT BASIN OR (CUSTOM PROCEDURE TRAY) ×3 IMPLANT
KIT SUCTION CATH 14FR (SUCTIONS) IMPLANT
KIT TURNOVER KIT B (KITS) ×3 IMPLANT
NS IRRIG 1000ML POUR BTL (IV SOLUTION) ×5 IMPLANT
PACK CHEST (CUSTOM PROCEDURE TRAY) ×3 IMPLANT
PAD ARMBOARD 7.5X6 YLW CONV (MISCELLANEOUS) ×6 IMPLANT
SET HNDPC FAN SPRY TIP SCT (DISPOSABLE) IMPLANT
SOL PREP POV-IOD 4OZ 10% (MISCELLANEOUS) ×2 IMPLANT
SPONGE GAUZE 2X2 STER 10/PKG (GAUZE/BANDAGES/DRESSINGS) ×2
SPONGE LAP 18X18 RF (DISPOSABLE) ×1 IMPLANT
STAPLER VISISTAT 35W (STAPLE) IMPLANT
SUT ETHILON 3 0 FSL (SUTURE) IMPLANT
SUT STEEL 6MS V (SUTURE) IMPLANT
SUT STEEL STERNAL CCS#1 18IN (SUTURE) IMPLANT
SUT STEEL SZ 6 DBL 3X14 BALL (SUTURE) IMPLANT
SUT VIC AB 1 CTX 36 (SUTURE)
SUT VIC AB 1 CTX36XBRD ANBCTR (SUTURE) ×2 IMPLANT
SUT VIC AB 2-0 CTX 27 (SUTURE) ×2 IMPLANT
SUT VIC AB 3-0 X1 27 (SUTURE) ×2 IMPLANT
SWAB COLLECTION DEVICE MRSA (MISCELLANEOUS) ×2 IMPLANT
SWAB CULTURE ESWAB REG 1ML (MISCELLANEOUS) ×2 IMPLANT
SYR 5ML LL (SYRINGE) IMPLANT
TOWEL GREEN STERILE (TOWEL DISPOSABLE) ×1 IMPLANT
TOWEL GREEN STERILE FF (TOWEL DISPOSABLE) ×3 IMPLANT
WATER STERILE IRR 1000ML POUR (IV SOLUTION) ×3 IMPLANT

## 2019-09-19 NOTE — Progress Notes (Signed)
I was asked to see the patient for right shoulder pain with recent MRI.  Based on evaluation, I have a low suspicion for infection.  I do see the inflamed AC joint without evidence of osteo.  Would recommend that the patient see me in the office in the next couple of weeks for formal evaluation.  Patient in agreement with the plan.  Mayra Reel, MD [facility] [phone] 11:54 AM

## 2019-09-19 NOTE — H&P (Signed)
PCP is Ignatius Specking, MD Referring Provider is No ref. provider found  No chief complaint on file.   HPI: The patient presents for evaluation of a persistent draining sinus in the region of a incision 3 months ago for I&D of right sternoclavicular joint infection.  This has been cultured positive for MSSA.  The drainage is sometimes serosanguineous.  There is no associated fever or systemic symptoms.  A CT scan of the chest demonstrated a phlegmon of fluid and nonhealthy tissue measuring 4 x 3 cm in the region of the sternoclavicular joint which does not communicate with the skin sinus tract which has been packed daily.  The sternal and clavicular bone appear to be healthy without osteomyelitis.  The patient will be prepared for expiration of the sinus tract to open direct medication to the phlegmon so it can be debrided irrigated and packed.  Patient is also having more pain involving the right shoulder acromioclavicular joint and diffuse discomfort with movement.  An MRI of the shoulder will be obtained.  The patient has been placed on oral Keflex which will be renewed.  Past Medical History:  Diagnosis Date  . Anemia   . Arthritis    rheumatoid  . Asthma   . Bipolar disorder (HCC)   . Depression   . GERD (gastroesophageal reflux disease)   . Hypertension 2019  . Ischemic colitis (HCC) 09/2008  . MSSA (methicillin susceptible Staphylococcus aureus)    sternaoclavicular absecess drainage  . Osteoarthritis    sterum and right shoulder  . PTSD (post-traumatic stress disorder)   . Tachycardia     Past Surgical History:  Procedure Laterality Date  . APPENDECTOMY    . APPLICATION OF WOUND VAC Right 06/20/2019   Procedure: APPLICATION OF WOUND VAC;  Surgeon: [name], MD;  Location: [facility] OR;  Service: Vascular;  Laterality: Right;  . APPLICATION OF WOUND VAC Right 06/23/2019   Procedure: APPLICATION OF WOUND VAC;  Surgeon: [name], MD;  Location: [facility] OR;  Service:  Thoracic;  Laterality: Right;  . COLONOSCOPY     x 2  . I & D EXTREMITY Right 05/31/2019   Procedure: IRRIGATION AND DEBRIDEMENT LOWER EXTREMITY;  Surgeon: Tarry Kos, MD;  Location: [facility] OR;  Service: Orthopedics;  Laterality: Right;  . I & D EXTREMITY Right 06/20/2019   Procedure: DEBRIDEMENT OF STERNOCLAVICULAR JOINT ABSCESS;  Surgeon: [name], MD;  Location: [facility] OR;  Service: Vascular;  Laterality: Right;  . TEE WITHOUT CARDIOVERSION N/A 06/05/2019   Procedure: TRANSESOPHAGEAL ECHOCARDIOGRAM (TEE);  Surgeon: Lewayne Bunting, MD;  Location: [facility] ENDOSCOPY;  Service: Cardiovascular;  Laterality: N/A;  . WOUND EXPLORATION Right 06/23/2019   Procedure: irrigation and clean out right shoulder;  Surgeon: [name], MD;  Location: [facility] OR;  Service: Thoracic;  Laterality: Right;    Family History  Problem Relation Age of Onset  . Alcohol abuse Mother   . Depression Mother   . Drug abuse Mother   . Physical abuse Mother   . Sexual abuse Mother   . ADD / ADHD Paternal Aunt   . Alcohol abuse Maternal Grandfather   . Bipolar disorder Maternal Grandmother   . Schizophrenia Maternal Grandmother   . Drug abuse Maternal Grandmother   . Alcohol abuse Paternal Grandfather   . Depression Paternal Grandfather   . Depression Paternal Grandmother   . Drug abuse Paternal Grandmother     Social History Social History   Tobacco Use  . Smoking  status: Current Every Day Smoker    Packs/day: 0.50    Years: 15.00    Pack years: 7.50    Types: Cigarettes  . Smokeless tobacco: Never Used  Vaping Use  . Vaping Use: Never used  Substance Use Topics  . Alcohol use: No    Comment: rare beer  . Drug use: No    Current Facility-Administered Medications  Medication Dose Route Frequency Provider Last Rate Last Admin  . ceFAZolin (ANCEF) 3 g in dextrose 5 % 50 mL IVPB  3 g Intravenous To SS-Surg [name], Theron Arista, MD        No Known Allergies  Review of Systems   No fever  diaphoresis No shortness of breath or chest pain Right shoulder discomfort with rotation of the right arm or elevation of the right arm No abdominal pain or change in bowel habits No lower extremity edema or tenderness No dizziness or change in vision No dental discomfort   BP 117/67   Pulse 93   Temp 98.4 F (36.9 C) (Oral)   Resp 20   Ht 6' (1.829 m)   Wt 127 kg   SpO2 96%   BMI 37.97 kg/m  Physical Exam      Exam    General- alert and comfortable    Neck- no JVD, no cervical adenopathy palpable, no carotid bruit   Lungs- clear without rales, wheezes.  Mucopurulent drainage from the sinus tract in the center of the previous surgical incision for I&D of the right sternoclavicular joint space infection   Cor- regular rate and rhythm, no murmur , gallop   Abdomen- soft, non-tender   Extremities - warm, non-tender, minimal edema   Neuro- oriented, appropriate, no focal weakness   Diagnostic Tests: Most recent CT scan of the chest images personally reviewed showing the phlegmon in the area of the right sternoclavicular joint not in communication with a sinus tract to the skin  Impression: Some indolent persistent focus of infection-inflammation in the right sternoclavicular joint which cannot be drained through the skin sinus tract.  The patient will need outpatient surgery under general anesthesia to open the sinus tract so that the indolent phlegmon can be debrided irrigated and packed and to allow the wound to heal from the base up to the skin.  Plan: Right shoulder MRI to assess his shoulder pain symptoms which may be referred from his wound  Patient will be prepared for outpatient surgery on August 6 under general anesthesia for exploration of sinus tract and debridement of sternoclavicular joint space phlegmon.  I discussed the benefits of the procedure as well as the risk of bleeding pain and recurrent infection.  [name], MD [facility]   09-19-19  R shoulder MRI results d/w patient - bursitis and tear in a rotator cuff tendon, will ask Ortho to assess Pre Procedure note for inpatients:   Ami Kumagai has been scheduled for Procedure(s): DEBRIDEMENT OF RIGHT STERNOCLAVICULAR JOINT (Right) today. The various methods of treatment have been discussed with the patient. After consideration of the risks, benefits and treatment options the patient has consented to the planned procedure.   The patient has been seen and labs reviewed. There are no changes in the patient's condition to prevent proceeding with the planned procedure today.  Recent labs:  Lab Results  Component Value Date   WBC 11.8 (H) 06/24/2019   HGB 8.6 (L) 06/24/2019   HCT 28.0 (L) 06/24/2019   PLT 530 (  H) 06/24/2019   GLUCOSE 123 (H) 06/24/2019   CHOL 189 07/07/2016   TRIG 158 (H) 07/07/2016   HDL 37 (L) 07/07/2016   LDLCALC 120 (H) 07/07/2016   ALT 26 06/20/2019   AST 23 06/20/2019   NA 137 06/24/2019   K 4.2 06/24/2019   CL 101 06/24/2019   CREATININE 0.79 06/24/2019   BUN 14 06/24/2019   CO2 27 06/24/2019   TSH 1.76 08/06/2018   INR 1.1 06/20/2019   HGBA1C 6.2 (H) 05/31/2019    [name] III, MD 09/19/2019 7:28 AM      [phone]

## 2019-09-19 NOTE — Transfer of Care (Signed)
Immediate Anesthesia Transfer of Care Note  Patient: Herbert Walsh  Procedure(s) Performed: EXCISIONAL DEBRIDEMENT AND IRRIGATION OF RIGHT STERNOCLAVICULAR JOINT WOUND (Right Chest)  Patient Location: PACU  Anesthesia Type:General  Level of Consciousness: awake, alert , patient cooperative and responds to stimulation  Airway & Oxygen Therapy: Patient Spontanous Breathing and Patient connected to face mask oxygen  Post-op Assessment: Report given to RN and Post -op Vital signs reviewed and stable  Post vital signs: Reviewed and stable  Last Vitals:  Vitals Value Taken Time  BP 126/67 09/19/19 0911  Temp 37 C 09/19/19 0910  Pulse 97 09/19/19 0912  Resp 33 09/19/19 0912  SpO2 91 % 09/19/19 0912  Vitals shown include unvalidated device data.  Last Pain:  Vitals:   09/19/19 0650  TempSrc:   PainSc: 8       Patients Stated Pain Goal: 4 (09/19/19 0650)  Complications: No complications documented.

## 2019-09-19 NOTE — Discharge Instructions (Signed)
Pack wound daily with 4x4 moist with Dakins solution, cover with dry 2x2s and tegederm No driving 24 hours No shower for 24 hrs Office will call for wound check Mon pm 8-9 -21 Continue keflex as prescribed

## 2019-09-19 NOTE — Op Note (Signed)
NAME: Herbert Walsh, [place] MEDICAL RECORD [id number] ACCOUNT [id number] DATE OF BIRTH:Jan 16, 1991 FACILITY: [facility] LOCATION: [facility] PHYSICIAN:Serge Main VAN TRIGT III, MD  OPERATIVE REPORT  DATE OF PROCEDURE:  09/19/2019  OPERATION: 1.  Excisional debridement of right sternoclavicular joint wound. 2.  Pulse lavage irrigation of right sternoclavicular joint wound with antibiotic irrigation.  SURGEON:  [name], MD  PREOPERATIVE DIAGNOSIS:  Persistent nonhealing right sternoclavicular joint wound with wound cultures positive for methicillin-sensitive Staphylococcus aureus.   POSTOPERATIVE DIAGNOSIS:  Persistent nonhealing right sternoclavicular joint wound with wound cultures positive for methicillin-sensitive Staphylococcus aureus.   ANESTHESIA:  General.  DESCRIPTION OF PROCEDURE:  The patient was evaluated in preoperative holding where final issues were addressed, the proper site was marked, and informed consent was documented.  The patient had been previously seen in the office and the procedure  explained in full, including the benefits of the surgery, the alternatives, the risks of bleeding, recurrent infection and pain, and the plan to send the patient home as an outpatient following the procedure and observation.  He agreed to proceed with  surgery.  The patient was brought to the operating room and placed supine on the operating table.  General anesthesia was induced by anesthesia and the patient remained stable.  The neck, chest and abdomen were prepped and draped as a sterile field.  A proper  time-out was performed.  First culture sticks were placed into the opening of the sinus tract over the sinus sternoclavicular joint on the right side and cultures were sent.  Next, the incision was opened and scar tissue was excised.  The wound penetrated down to the clavicular head approximately 5 cm.  The nonhealing tissue, including some poorly vascularized granulation tissue was  sharply excised down to the sternal bone.   After hemostasis was achieved, the tunnel was opened to allow packing with 4 x 4 gauze so that it would not close over the top.  Next, a liter of vancomycin irrigation was pulse lavaged into the wound.  Next, hemostasis was achieved.  Next, a 4 x 4 gauze was soaked in Dakin solution and placed into the wound and covered with dry gauze and a Tegaderm dressing.  The patient was then  reversed from anesthesia and returned to recovery room.  VN/NUANCE  D:09/19/2019 T:09/19/2019 JOB:[id number]

## 2019-09-19 NOTE — Anesthesia Postprocedure Evaluation (Signed)
Anesthesia Post Note  Patient: Herbert Walsh  Procedure(s) Performed: EXCISIONAL DEBRIDEMENT AND IRRIGATION OF RIGHT STERNOCLAVICULAR JOINT WOUND (Right Chest)     Patient location during evaluation: PACU Anesthesia Type: General Level of consciousness: awake and alert Pain management: pain level controlled Vital Signs Assessment: post-procedure vital signs reviewed and stable Respiratory status: spontaneous breathing, nonlabored ventilation and respiratory function stable Cardiovascular status: blood pressure returned to baseline and stable Postop Assessment: no apparent nausea or vomiting Anesthetic complications: no   No complications documented.  Last Vitals:  Vitals:   09/19/19 1040 09/19/19 1110  BP: 112/75 107/73  Pulse: 93 96  Resp: 14 14  Temp:  36.8 C  SpO2: 90% 94%    Last Pain:  Vitals:   09/19/19 1110  TempSrc:   PainSc: Asleep                 Angello Chien,W. EDMOND

## 2019-09-19 NOTE — Brief Op Note (Signed)
09/19/2019  9:07 AM  PATIENT:  Herbert Walsh  29 y.o. male  PRE-OPERATIVE DIAGNOSIS:  RIGHT STERNOCLAVICULAR JOINT INFECTION  POST-OPERATIVE DIAGNOSIS:  RIGHT STERNOCLAVICULAR JOINT INFECTION  PROCEDURE:  Procedure(s): EXCISIONAL DEBRIDEMENT AND IRRIGATION OF RIGHT STERNOCLAVICULAR JOINT WOUND (Right)  SURGEON:  Surgeon(s) and Role:    [name], MD - Primary  PHYSICIAN ASSISTANT:    ASSISTANTS: none   ANESTHESIA:   general   EBL:  10 ml   BLOOD ADMINISTERED:none  DRAINS: none   LOCAL MEDICATIONS USED:  Amount: none ml and NONE  SPECIMEN:  Aspirate  DISPOSITION OF SPECIMEN:  microbiology  COUNTS:  YES  TOURNIQUET:  * No tourniquets in log *  DICTATION: .Dragon Dictation  PLAN OF CARE: Discharge to home after PACU  PATIENT DISPOSITION:  PACU - hemodynamically stable.   Delay start of Pharmacological VTE agent (>24hrs) due to surgical blood loss or risk of bleeding: yes
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
## 2019-09-19 NOTE — Anesthesia Procedure Notes (Signed)
Procedure Name: Intubation Date/Time: 09/19/2019 7:38 AM Performed by: Glynda Jaeger, CRNA Pre-anesthesia Checklist: Patient identified, Patient being monitored, Timeout performed, Emergency Drugs available and Suction available Patient Re-evaluated:Patient Re-evaluated prior to induction Oxygen Delivery Method: Circle System Utilized Preoxygenation: Pre-oxygenation with 100% oxygen Induction Type: IV induction Ventilation: Mask ventilation without difficulty Laryngoscope Size: Mac, 3 and 4 Grade View: Grade II Tube type: Oral Tube size: 7.5 mm Number of attempts: 1 Airway Equipment and Method: Stylet Placement Confirmation: ETT inserted through vocal cords under direct vision,  positive ETCO2 and breath sounds checked- equal and bilateral Secured at: 22 cm Tube secured with: Tape Dental Injury: Teeth and Oropharynx as per pre-operative assessment

## 2019-09-20 ENCOUNTER — Encounter (HOSPITAL_COMMUNITY): Payer: Self-pay | Admitting: Cardiothoracic Surgery

## 2019-09-22 ENCOUNTER — Ambulatory Visit (INDEPENDENT_AMBULATORY_CARE_PROVIDER_SITE_OTHER): Payer: Self-pay | Admitting: Surgical

## 2019-09-22 ENCOUNTER — Other Ambulatory Visit: Payer: Self-pay

## 2019-09-22 VITALS — BP 93/59 | HR 130 | Temp 97.6°F | Resp 20 | Ht 72.0 in | Wt 280.0 lb

## 2019-09-22 DIAGNOSIS — Z5189 Encounter for other specified aftercare: Secondary | ICD-10-CM

## 2019-09-22 DIAGNOSIS — M25511 Pain in right shoulder: Secondary | ICD-10-CM

## 2019-09-22 DIAGNOSIS — Z4800 Encounter for change or removal of nonsurgical wound dressing: Secondary | ICD-10-CM

## 2019-09-22 LAB — TYPE AND SCREEN
ABO/RH(D): O POS
Antibody Screen: NEGATIVE
Unit division: 0

## 2019-09-22 LAB — BPAM RBC
Blood Product Expiration Date: 202109012359
ISSUE DATE / TIME: 202108060810
Unit Type and Rh: 5100

## 2019-09-22 NOTE — Progress Notes (Signed)
[address]       [place] [address]             [phone]      Yaser Harvill [facility] Medical Record #[id number] Date of Birth: [birth year]  Referring: Ignatius Specking, MD Primary Care: Ignatius Specking, MD Primary Cardiologist: No primary care provider on file.   Chief Complaint:   POST OP FOLLOW UP OPERATIVE REPORT  DATE OF PROCEDURE:  09/19/2019  OPERATION: 1.  Excisional debridement of right sternoclavicular joint wound. 2.  Pulse lavage irrigation of right sternoclavicular joint wound with antibiotic irrigation.  SURGEON:  [name], MD  PREOPERATIVE DIAGNOSIS:  Persistent nonhealing right sternoclavicular joint wound with wound cultures positive for methicillin-sensitive Staphylococcus aureus.   POSTOPERATIVE DIAGNOSIS:  Persistent nonhealing right sternoclavicular joint wound with wound cultures positive for methicillin-sensitive Staphylococcus aureus.   ANESTHESIA:  General. History of Present Illness:    29 year old male status post the above procedures in the office on today's date for dressing change.  He denies fevers, chills or other significant constitutional symptoms.  The wound itself is quite painful.      Past Medical History:  Diagnosis Date  . Anemia   . Arthritis    rheumatoid  . Asthma   . Bipolar disorder (HCC)   . Depression   . GERD (gastroesophageal reflux disease)   . Hypertension 2019  . Ischemic colitis (HCC) 09/2008  . MSSA (methicillin susceptible Staphylococcus aureus)    sternaoclavicular absecess drainage  . Osteoarthritis    sterum and right shoulder  . PTSD (post-traumatic stress disorder)   . Tachycardia      Social History   Tobacco Use  Smoking Status Current Every Day Smoker  . Packs/day: 0.50  . Years: 15.00  . Pack years: 7.50  . Types: Cigarettes  Smokeless Tobacco Never Used    Social History   Substance and Sexual Activity  Alcohol Use No   Comment: rare beer     No  Known Allergies  Current Outpatient Medications  Medication Sig Dispense Refill  . atorvastatin (LIPITOR) 10 MG tablet Take 10 mg by mouth at bedtime.   12  . cephALEXin (KEFLEX) 500 MG capsule Take 1 capsule (500 mg total) by mouth 3 (three) times daily. 21 capsule 0  . Cholecalciferol (VITAMIN D3) 50 MCG (2000 UT) TABS Take 2,000 mg by mouth daily.    . cloNIDine (CATAPRES) 0.2 MG tablet Take 1 tablet (0.2 mg total) by mouth 3 (three) times daily. 270 tablet 0  . lisinopril (PRINIVIL,ZESTRIL) 10 MG tablet Take 10 mg by mouth daily.    [name] lithium carbonate (ESKALITH) 450 MG CR tablet Take 1 tablet (450 mg total) by mouth at bedtime. Take with 300 mg to equal 750 mg at bedtime 90 tablet 0  . lithium carbonate (LITHOBID) 300 MG CR tablet Take total of 750 mg at night along with 400 mg tab (Patient taking differently: Take 300 mg by mouth See admin instructions. Take total of 750 mg at night along with 400 mg tab) 90 tablet 0  . Multiple Vitamin (MULTIVITAMIN WITH MINERALS) TABS tablet Take 1 tablet by mouth daily.    [name] omeprazole (PRILOSEC) 20 MG capsule Take 20 mg by mouth daily.    [name] oxyCODONE-acetaminophen (PERCOCET) 10-325 MG tablet Take 1 tablet by mouth daily.    . risperidone (RISPERDAL) 4 MG tablet Take 1 tablet (4 mg total) by mouth at bedtime. 90 tablet 0  .  sertraline (ZOLOFT) 100 MG tablet Take 1 tablet (100 mg total) by mouth daily. (Patient taking differently: Take 150 mg by mouth daily. ) 90 tablet 0  . traZODone (DESYREL) 150 MG tablet Take 1 tablet (150 mg total) by mouth at bedtime. 90 tablet 0  . zinc gluconate 50 MG tablet Take 50 mg by mouth daily.    . cyclobenzaprine (FLEXERIL) 10 MG tablet Take 1 tablet (10 mg total) by mouth 3 (three) times daily as needed for muscle spasms. 30 tablet 0   No current facility-administered medications for this visit.       Physical Exam: BP (!) 93/59   Pulse (!) 130   Temp 97.6 F (36.4 C) (Skin)   Resp 20   Ht 6' (1.829 m)   Wt  280 lb (127 kg)   SpO2 94% Comment: RA  BMI 37.97 kg/m   Wound: The wound appears clean with moderate granulation tissue.  No purulence.  No cellulitis.   Diagnostic Studies & Laboratory data:     Recent Radiology Findings:   No results found.    Recent Lab Findings: Lab Results  Component Value Date   WBC 10.3 09/19/2019   HGB 11.8 (L) 09/19/2019   HCT 38.9 (L) 09/19/2019   PLT 307 09/19/2019   GLUCOSE 161 (H) 09/19/2019   CHOL 189 07/07/2016   TRIG 158 (H) 07/07/2016   HDL 37 (L) 07/07/2016   LDLCALC 120 (H) 07/07/2016   ALT 20 09/19/2019   AST 21 09/19/2019   NA 135 09/19/2019   K 3.9 09/19/2019   CL 102 09/19/2019   CREATININE 0.86 09/19/2019   BUN 6 09/19/2019   CO2 24 09/19/2019   TSH 1.76 08/06/2018   INR 1.0 09/19/2019   HGBA1C 6.2 (H) 05/31/2019      Assessment / Plan: Dressing was changed with a sterile 4 x 4 and normal saline.  He is having his mother assist him with dressing changes at discharge with instructions for Dakin's solution.  He is also to continue his Keflex.  He is scheduled to see Dr. Maren Beach for reassessment of the wound in 2 days.      Medication Changes: No orders of the defined types were placed in this encounter.     Rowe Clack, PA-C 09/22/2019 3:34 PM

## 2019-09-22 NOTE — Patient Instructions (Signed)
Continue same dressing changes F/U in 2 days

## 2019-09-24 ENCOUNTER — Other Ambulatory Visit: Payer: Self-pay

## 2019-09-24 ENCOUNTER — Ambulatory Visit (INDEPENDENT_AMBULATORY_CARE_PROVIDER_SITE_OTHER): Payer: Self-pay | Admitting: Cardiothoracic Surgery

## 2019-09-24 ENCOUNTER — Encounter: Payer: Self-pay | Admitting: Cardiothoracic Surgery

## 2019-09-24 VITALS — BP 117/82 | HR 119 | Temp 97.8°F | Resp 20 | Ht 72.0 in | Wt 280.0 lb

## 2019-09-24 DIAGNOSIS — Z5189 Encounter for other specified aftercare: Secondary | ICD-10-CM

## 2019-09-24 DIAGNOSIS — M009 Pyogenic arthritis, unspecified: Secondary | ICD-10-CM

## 2019-09-24 DIAGNOSIS — Z4889 Encounter for other specified surgical aftercare: Secondary | ICD-10-CM

## 2019-09-24 LAB — AEROBIC/ANAEROBIC CULTURE W GRAM STAIN (SURGICAL/DEEP WOUND)

## 2019-09-24 MED ORDER — OXYCODONE-ACETAMINOPHEN 10-325 MG PO TABS: 1.0000 | ORAL_TABLET | Freq: Three times a day (TID) | ORAL | 0 refills | Status: DC | PRN

## 2019-09-24 MED ORDER — CEPHALEXIN 500 MG PO CAPS: 500.0000 mg | ORAL_CAPSULE | Freq: Three times a day (TID) | ORAL | 0 refills | Status: DC

## 2019-09-24 NOTE — Progress Notes (Signed)
PCP is Glenda Chroman, MD Referring Provider is Glenda Chroman, MD  Chief Complaint  Patient presents with  . Routine Post Op    wound check    HPI: Postop visit for wound check.  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  Wet-to-dry dressing changes daily with 4 x 4 gauze and Dakin solution.  Patient has been compliant with his Keflex.  Patient is taking oxycodone for the pain.  I personally examined the wound and change the packing today.  The wound is 2.5 cm wide at the surface and proxy 4 cm deep.  No purulent tissue.  Scant granulation tissue.  Wound not ready yet to add activated collagen.   Past Medical History:  Diagnosis Date  . Anemia   . Arthritis    rheumatoid  . Asthma   . Bipolar disorder ([place])   . Depression   . GERD (gastroesophageal reflux disease)   . Hypertension 2019  . Ischemic colitis ([place]) 09/2008  . MSSA (methicillin susceptible Staphylococcus aureus)    sternaoclavicular absecess drainage  . Osteoarthritis    sterum and right shoulder  . PTSD (post-traumatic stress disorder)   . Tachycardia     Past Surgical History:  Procedure Laterality Date  . APPENDECTOMY    . APPLICATION OF WOUND VAC Right 06/20/2019   Procedure: APPLICATION OF WOUND VAC;  Surgeon: Ivin Poot, MD;  Location: [place];  Service: Vascular;  Laterality: Right;  . APPLICATION OF WOUND VAC Right 06/23/2019   Procedure: APPLICATION OF WOUND VAC;  Surgeon: Ivin Poot, MD;  Location: [place];  Service: Thoracic;  Laterality: Right;  . COLONOSCOPY     x 2  . I & D EXTREMITY Right 05/31/2019   Procedure: IRRIGATION AND DEBRIDEMENT LOWER EXTREMITY;  Surgeon: Leandrew Koyanagi, MD;  Location: [place];  Service: Orthopedics;  Laterality: Right;  . I & D EXTREMITY Right 06/20/2019   Procedure: DEBRIDEMENT OF STERNOCLAVICULAR JOINT ABSCESS;  Surgeon: Ivin Poot, MD;  Location: [place];  Service: Vascular;  Laterality: Right;   . STERNAL WOUND DEBRIDEMENT Right 09/19/2019   Procedure: EXCISIONAL DEBRIDEMENT AND IRRIGATION OF RIGHT STERNOCLAVICULAR JOINT WOUND;  Surgeon: Ivin Poot, MD;  Location: [place];  Service: Thoracic;  Laterality: Right;  . TEE WITHOUT CARDIOVERSION N/A 06/05/2019   Procedure: TRANSESOPHAGEAL ECHOCARDIOGRAM (TEE);  Surgeon: Lelon Perla, MD;  Location: [place];  Service: Cardiovascular;  Laterality: N/A;  . WOUND EXPLORATION Right 06/23/2019   Procedure: irrigation and clean out right shoulder;  Surgeon: Ivin Poot, MD;  Location: [facility] OR;  Service: Thoracic;  Laterality: Right;    Family History  Problem Relation Age of Onset  . Alcohol abuse Mother   . Depression Mother   . Drug abuse Mother   . Physical abuse Mother   . Sexual abuse Mother   . ADD / ADHD Paternal Aunt   . Alcohol abuse Maternal Grandfather   . Bipolar disorder Maternal Grandmother   . Schizophrenia Maternal Grandmother   . Drug abuse Maternal Grandmother   . Alcohol abuse Paternal Grandfather   . Depression Paternal Grandfather   . Depression Paternal Grandmother   . Drug abuse Paternal Grandmother     Social History Social History   Tobacco Use  . Smoking status: Current Every Day Smoker    Packs/day: 0.50    Years: 15.00    Pack years: 7.50    Types: Cigarettes  .  Smokeless tobacco: Never Used  Vaping Use  . Vaping Use: Never used  Substance Use Topics  . Alcohol use: No    Comment: rare beer  . Drug use: No    Current Outpatient Medications  Medication Sig Dispense Refill  . atorvastatin (LIPITOR) 10 MG tablet Take 10 mg by mouth at bedtime.   12  . cephALEXin (KEFLEX) 500 MG capsule Take 1 capsule (500 mg total) by mouth 3 (three) times daily. 21 capsule 0  . Cholecalciferol (VITAMIN D3) 50 MCG (2000 UT) TABS Take 2,000 mg by mouth daily.    . cloNIDine (CATAPRES) 0.2 MG tablet Take 1 tablet (0.2 mg total) by mouth 3 (three) times daily. 270 tablet 0  . lisinopril  (PRINIVIL,ZESTRIL) 10 MG tablet Take 10 mg by mouth daily.    [name] lithium carbonate (ESKALITH) 450 MG CR tablet Take 1 tablet (450 mg total) by mouth at bedtime. Take with 300 mg to equal 750 mg at bedtime 90 tablet 0  . lithium carbonate (LITHOBID) 300 MG CR tablet Take total of 750 mg at night along with 400 mg tab (Patient taking differently: Take 300 mg by mouth See admin instructions. Take total of 750 mg at night along with 400 mg tab) 90 tablet 0  . Multiple Vitamin (MULTIVITAMIN WITH MINERALS) TABS tablet Take 1 tablet by mouth daily.    [name] NARCAN 4 MG/0.1ML LIQD nasal spray kit 1 spray once.    [name] omeprazole (PRILOSEC) 20 MG capsule Take 20 mg by mouth daily.    [name] oxyCODONE-acetaminophen (PERCOCET) 10-325 MG tablet Take 1 tablet by mouth every 8 (eight) hours as needed for up to 7 days for pain. 21 tablet 0  . risperidone (RISPERDAL) 4 MG tablet Take 1 tablet (4 mg total) by mouth at bedtime. 90 tablet 0  . sertraline (ZOLOFT) 100 MG tablet Take 1 tablet (100 mg total) by mouth daily. (Patient taking differently: Take 150 mg by mouth daily. ) 90 tablet 0  . traZODone (DESYREL) 150 MG tablet Take 1 tablet (150 mg total) by mouth at bedtime. 90 tablet 0  . zinc gluconate 50 MG tablet Take 50 mg by mouth daily.    . cyclobenzaprine (FLEXERIL) 10 MG tablet Take 1 tablet (10 mg total) by mouth 3 (three) times daily as needed for muscle spasms. 30 tablet 0   No current facility-administered medications for this visit.    No Known Allergies  Review of Systems   No fever  BP 117/82   Pulse (!) 119   Temp 97.8 F (36.6 C) (Skin)   Resp 20   Ht 6' (1.829 m)   Wt 280 lb (127 kg)   SpO2 94%   BMI 37.97 kg/m  Physical Exam Alert and oriented Heart rate regular baseline tachycardia Wound with subcutaneous tissue forming granulation tissue but the deeper tissue without granulation tissue 4 x 4 gauze packed and sterile dressing applied  Diagnostic Tests: None  Impression: Continue with  daily dressing changes using Dakin solution wet-to-dry and packed to the depth of the wound.  Plan: Return in 5 days for wound check.  Continue Keflex.  Continue as needed oxycodone.  Len Childs, MD [facility] [phone]

## 2019-09-25 ENCOUNTER — Telehealth (INDEPENDENT_AMBULATORY_CARE_PROVIDER_SITE_OTHER): Payer: BC Managed Care – PPO | Admitting: Psychiatry

## 2019-09-25 ENCOUNTER — Encounter (HOSPITAL_COMMUNITY): Payer: Self-pay | Admitting: Psychiatry

## 2019-09-25 DIAGNOSIS — F331 Major depressive disorder, recurrent, moderate: Secondary | ICD-10-CM | POA: Diagnosis not present

## 2019-09-25 DIAGNOSIS — F6381 Intermittent explosive disorder: Secondary | ICD-10-CM | POA: Diagnosis not present

## 2019-09-25 DIAGNOSIS — G4700 Insomnia, unspecified: Secondary | ICD-10-CM

## 2019-09-25 DIAGNOSIS — F4310 Post-traumatic stress disorder, unspecified: Secondary | ICD-10-CM

## 2019-09-25 MED ORDER — LITHIUM CARBONATE ER 450 MG PO TBCR: 450.0000 mg | EXTENDED_RELEASE_TABLET | Freq: Every day | ORAL | 0 refills | Status: DC

## 2019-09-25 MED ORDER — CLONIDINE HCL 0.2 MG PO TABS: 0.2000 mg | ORAL_TABLET | Freq: Three times a day (TID) | ORAL | 0 refills | Status: DC

## 2019-09-25 MED ORDER — TRAZODONE HCL 150 MG PO TABS: 150.0000 mg | ORAL_TABLET | Freq: Every day | ORAL | 0 refills | Status: DC

## 2019-09-25 MED ORDER — RISPERIDONE 4 MG PO TABS: 4.0000 mg | ORAL_TABLET | Freq: Every day | ORAL | 0 refills | Status: DC

## 2019-09-25 MED ORDER — SERTRALINE HCL 100 MG PO TABS: 150.0000 mg | ORAL_TABLET | Freq: Every day | ORAL | 0 refills | Status: DC

## 2019-09-25 MED ORDER — LITHIUM CARBONATE ER 300 MG PO TBCR: EXTENDED_RELEASE_TABLET | ORAL | 0 refills | Status: DC

## 2019-09-25 NOTE — Patient Instructions (Signed)
1.Continue sertraline 150 mg daily 2.Continuerisperidone4mg  at night 3.Continuelithium750mg  at night 4.Continueclonidine 0.2 mg three times a day 6.ContinueTrazodone150mg  daily  7.Next appointment:10/14 at 2:30

## 2019-09-26 NOTE — Addendum Note (Signed)
Addended by: Neysa Hotter on: 09/26/2019 09:47 AM   Modules accepted: Orders

## 2019-09-29 ENCOUNTER — Other Ambulatory Visit: Payer: Self-pay

## 2019-09-29 ENCOUNTER — Ambulatory Visit (INDEPENDENT_AMBULATORY_CARE_PROVIDER_SITE_OTHER): Payer: Self-pay | Admitting: Cardiothoracic Surgery

## 2019-09-29 ENCOUNTER — Encounter: Payer: Self-pay | Admitting: Cardiothoracic Surgery

## 2019-09-29 DIAGNOSIS — Z5189 Encounter for other specified aftercare: Secondary | ICD-10-CM | POA: Insufficient documentation

## 2019-09-29 DIAGNOSIS — Z4801 Encounter for change or removal of surgical wound dressing: Secondary | ICD-10-CM | POA: Insufficient documentation

## 2019-09-29 NOTE — Progress Notes (Signed)
PCP is Glenda Chroman, MD Referring Provider is Glenda Chroman, MD  Chief Complaint  Patient presents with  . Wound Check    HPI: Presents for wound check History of MSSA right sternoclavicular joint infection I personally change the dressing today.  The wound is now clean at its depths Current measurements 4 cm wide at the skin 3 cm deep Activated human collagen-accelerate applied to the depth of the wound and then covered with saline wet-to-dry 4 x 4 cut in half. Continue daily wet-to-dry dressings on top of celerate application  Past Medical History:  Diagnosis Date  . Anemia   . Arthritis    rheumatoid  . Asthma   . Bipolar disorder ([place])   . Depression   . GERD (gastroesophageal reflux disease)   . Hypertension 2019  . Ischemic colitis ([place]) 09/2008  . MSSA (methicillin susceptible Staphylococcus aureus)    sternaoclavicular absecess drainage  . Osteoarthritis    sterum and right shoulder  . PTSD (post-traumatic stress disorder)   . Tachycardia     Past Surgical History:  Procedure Laterality Date  . APPENDECTOMY    . APPLICATION OF WOUND VAC Right 06/20/2019   Procedure: APPLICATION OF WOUND VAC;  Surgeon: Ivin Poot, MD;  Location: [place];  Service: Vascular;  Laterality: Right;  . APPLICATION OF WOUND VAC Right 06/23/2019   Procedure: APPLICATION OF WOUND VAC;  Surgeon: Ivin Poot, MD;  Location: [place];  Service: Thoracic;  Laterality: Right;  . COLONOSCOPY     x 2  . I & D EXTREMITY Right 05/31/2019   Procedure: IRRIGATION AND DEBRIDEMENT LOWER EXTREMITY;  Surgeon: Leandrew Koyanagi, MD;  Location: [place];  Service: Orthopedics;  Laterality: Right;  . I & D EXTREMITY Right 06/20/2019   Procedure: DEBRIDEMENT OF STERNOCLAVICULAR JOINT ABSCESS;  Surgeon: Ivin Poot, MD;  Location: [place];  Service: Vascular;  Laterality: Right;  . STERNAL WOUND DEBRIDEMENT Right 09/19/2019   Procedure: EXCISIONAL DEBRIDEMENT AND IRRIGATION OF RIGHT STERNOCLAVICULAR JOINT  WOUND;  Surgeon: Ivin Poot, MD;  Location: [place];  Service: Thoracic;  Laterality: Right;  . TEE WITHOUT CARDIOVERSION N/A 06/05/2019   Procedure: TRANSESOPHAGEAL ECHOCARDIOGRAM (TEE);  Surgeon: Lelon Perla, MD;  Location: [place];  Service: Cardiovascular;  Laterality: N/A;  . WOUND EXPLORATION Right 06/23/2019   Procedure: irrigation and clean out right shoulder;  Surgeon: Ivin Poot, MD;  Location[facility] OR;  Service: Thoracic;  Laterality: Right;    Family History  Problem Relation Age of Onset  . Alcohol abuse Mother   . Depression Mother   . Drug abuse Mother   . Physical abuse Mother   . Sexual abuse Mother   . ADD / ADHD Paternal Aunt   . Alcohol abuse Maternal Grandfather   . Bipolar disorder Maternal Grandmother   . Schizophrenia Maternal Grandmother   . Drug abuse Maternal Grandmother   . Alcohol abuse Paternal Grandfather   . Depression Paternal Grandfather   . Depression Paternal Grandmother   . Drug abuse Paternal Grandmother     Social History Social History   Tobacco Use  . Smoking status: Current Every Day Smoker    Packs/day: 0.50    Years: 15.00    Pack years: 7.50    Types: Cigarettes  . Smokeless tobacco: Never Used  Vaping Use  . Vaping Use: Never used  Substance Use Topics  . Alcohol use: No    Comment: rare beer  . Drug use:  No    Current Outpatient Medications  Medication Sig Dispense Refill  . atorvastatin (LIPITOR) 10 MG tablet Take 10 mg by mouth at bedtime.   12  . cephALEXin (KEFLEX) 500 MG capsule Take 1 capsule (500 mg total) by mouth 3 (three) times daily. 21 capsule 0  . Cholecalciferol (VITAMIN D3) 50 MCG (2000 UT) TABS Take 2,000 mg by mouth daily.    Derrill Memo ON 11/11/2019] cloNIDine (CATAPRES) 0.2 MG tablet Take 1 tablet (0.2 mg total) by mouth 3 (three) times daily. 270 tablet 0  . cyclobenzaprine (FLEXERIL) 10 MG tablet Take 1 tablet (10 mg total) by mouth 3 (three) times daily as needed for muscle spasms. 30  tablet 0  . lisinopril (PRINIVIL,ZESTRIL) 10 MG tablet Take 10 mg by mouth daily.    Derrill Memo ON 11/12/2019] lithium carbonate (ESKALITH) 450 MG CR tablet Take 1 tablet (450 mg total) by mouth at bedtime. Take with 300 mg to equal 750 mg at bedtime 90 tablet 0  . [START ON 11/13/2019] lithium carbonate (LITHOBID) 300 MG CR tablet Take total of 750 mg at night along with 400 mg tab 90 tablet 0  . Multiple Vitamin (MULTIVITAMIN WITH MINERALS) TABS tablet Take 1 tablet by mouth daily.    [name] NARCAN 4 MG/0.1ML LIQD nasal spray kit 1 spray once.    [name] omeprazole (PRILOSEC) 20 MG capsule Take 20 mg by mouth daily.    [name] oxyCODONE-acetaminophen (PERCOCET) 10-325 MG tablet Take 1 tablet by mouth every 8 (eight) hours as needed for up to 7 days for pain. 21 tablet 0  . [START ON 12/12/2019] risperidone (RISPERDAL) 4 MG tablet Take 1 tablet (4 mg total) by mouth at bedtime. 90 tablet 0  . [START ON 10/03/2019] sertraline (ZOLOFT) 100 MG tablet Take 1.5 tablets (150 mg total) by mouth daily. 135 tablet 0  . [START ON 11/12/2019] traZODone (DESYREL) 150 MG tablet Take 1 tablet (150 mg total) by mouth at bedtime. 90 tablet 0  . zinc gluconate 50 MG tablet Take 50 mg by mouth daily.     No current facility-administered medications for this visit.    No Known Allergies  Review of Systems  No fever Appointment to be seen by orthopedics for his right shoulder pain by Dr. Marinus Maw  BP 121/78   Pulse (!) 115   Ht 6' (1.829 m)   Wt 278 lb (126.1 kg)   SpO2 92%   BMI 37.70 kg/m  Physical Exam Alert and oriented Sinus tachycardia stable rhythm Wound clean without purulent or serous exudate  Diagnostic Tests:   Impression: Continue oral Keflex 500 3 times daily for MSSA chest wall infection Continue daily wet-to-dry dressing changes  Plan: Return in 2 days for second application of activated human collagen  Len Childs, MD [facility] [phone]

## 2019-10-01 ENCOUNTER — Ambulatory Visit (INDEPENDENT_AMBULATORY_CARE_PROVIDER_SITE_OTHER): Payer: Self-pay | Admitting: Cardiothoracic Surgery

## 2019-10-01 ENCOUNTER — Encounter: Payer: Self-pay | Admitting: Cardiothoracic Surgery

## 2019-10-01 ENCOUNTER — Other Ambulatory Visit: Payer: Self-pay

## 2019-10-01 VITALS — BP 125/83 | HR 122 | Temp 98.4°F | Resp 16 | Ht 72.0 in | Wt 278.0 lb

## 2019-10-01 DIAGNOSIS — M009 Pyogenic arthritis, unspecified: Secondary | ICD-10-CM

## 2019-10-01 DIAGNOSIS — Z5189 Encounter for other specified aftercare: Secondary | ICD-10-CM

## 2019-10-01 DIAGNOSIS — Z09 Encounter for follow-up examination after completed treatment for conditions other than malignant neoplasm: Secondary | ICD-10-CM

## 2019-10-01 DIAGNOSIS — Z4800 Encounter for change or removal of nonsurgical wound dressing: Secondary | ICD-10-CM

## 2019-10-01 MED ORDER — OXYCODONE-ACETAMINOPHEN 10-325 MG PO TABS: 1.0000 | ORAL_TABLET | Freq: Three times a day (TID) | ORAL | 0 refills | Status: AC | PRN

## 2019-10-01 MED ORDER — CEPHALEXIN 500 MG PO CAPS: 500.0000 mg | ORAL_CAPSULE | Freq: Three times a day (TID) | ORAL | 0 refills | Status: DC

## 2019-10-01 NOTE — Progress Notes (Signed)
PCP is Ignatius Specking, MD Referring Provider is Ignatius Specking, MD  Chief Complaint  Patient presents with  . Routine Post Op    wound eval/check    HPI: Patient returns for a wound check.  On the last visit 2 days ago activated human collagen was applied to the depth of the wound.  The wound has good base of granulation tissue and is showing signs of contraction and filling.  The patient is changing the dressing once a day with wet-to-dry.  Today I personally change the wound dressing and examine the wound.  It has excellent better granulation tissue.  It now measures 4 cm wide at the surface and approximately 2-1/2 to 3 cm deep.  Activated human collagen was applied covered with a moist gauze and dry dressing.  Past Medical History:  Diagnosis Date  . Anemia   . Arthritis    rheumatoid  . Asthma   . Bipolar disorder ([facility])   . Depression   . GERD (gastroesophageal reflux disease)   . Hypertension 2019  . Ischemic colitis ([facility]) 09/2008  . MSSA (methicillin susceptible Staphylococcus aureus)    sternaoclavicular absecess drainage  . Osteoarthritis    sterum and right shoulder  . PTSD (post-traumatic stress disorder)   . Tachycardia     Past Surgical History:  Procedure Laterality Date  . APPENDECTOMY    . APPLICATION OF WOUND VAC Right 06/20/2019   Procedure: APPLICATION OF WOUND VAC;  Surgeon: [name], MD;  Location: [facility] OR;  Service: Vascular;  Laterality: Right;  . APPLICATION OF WOUND VAC Right 06/23/2019   Procedure: APPLICATION OF WOUND VAC;  Surgeon: [name], MD;  Location: [facility] OR;  Service: Thoracic;  Laterality: Right;  . COLONOSCOPY     x 2  . I & D EXTREMITY Right 05/31/2019   Procedure: IRRIGATION AND DEBRIDEMENT LOWER EXTREMITY;  Surgeon: Tarry Kos, MD;  Location: [facility] OR;  Service: Orthopedics;  Laterality: Right;  . I & D EXTREMITY Right 06/20/2019   Procedure: DEBRIDEMENT OF STERNOCLAVICULAR JOINT ABSCESS;  Surgeon: [name], MD;   Location: [facility] OR;  Service: Vascular;  Laterality: Right;  . STERNAL WOUND DEBRIDEMENT Right 09/19/2019   Procedure: EXCISIONAL DEBRIDEMENT AND IRRIGATION OF RIGHT STERNOCLAVICULAR JOINT WOUND;  Surgeon: [name], MD;  Location: [facility] OR;  Service: Thoracic;  Laterality: Right;  . TEE WITHOUT CARDIOVERSION N/A 06/05/2019   Procedure: TRANSESOPHAGEAL ECHOCARDIOGRAM (TEE);  Surgeon: Lewayne Bunting, MD;  Location: [facility] ENDOSCOPY;  Service: Cardiovascular;  Laterality: N/A;  . WOUND EXPLORATION Right 06/23/2019   Procedure: irrigation and clean out right shoulder;  Surgeon: [name], MD;  Location: [facility] OR;  Service: Thoracic;  Laterality: Right;    Family History  Problem Relation Age of Onset  . Alcohol abuse Mother   . Depression Mother   . Drug abuse Mother   . Physical abuse Mother   . Sexual abuse Mother   . ADD / ADHD Paternal Aunt   . Alcohol abuse Maternal Grandfather   . Bipolar disorder Maternal Grandmother   . Schizophrenia Maternal Grandmother   . Drug abuse Maternal Grandmother   . Alcohol abuse Paternal Grandfather   . Depression Paternal Grandfather   . Depression Paternal Grandmother   . Drug abuse Paternal Grandmother     Social History Social History   Tobacco Use  . Smoking status: Current Every Day Smoker    Packs/day: 0.50    Years: 15.00  Pack years: 7.50    Types: Cigarettes  . Smokeless tobacco: Never Used  Vaping Use  . Vaping Use: Never used  Substance Use Topics  . Alcohol use: No    Comment: rare beer  . Drug use: No    Current Outpatient Medications  Medication Sig Dispense Refill  . atorvastatin (LIPITOR) 10 MG tablet Take 10 mg by mouth at bedtime.   12  . cephALEXin (KEFLEX) 500 MG capsule Take 1 capsule (500 mg total) by mouth 3 (three) times daily. 21 capsule 0  . Cholecalciferol (VITAMIN D3) 50 MCG (2000 UT) TABS Take 2,000 mg by mouth daily.    [name] ON 11/11/2019] cloNIDine (CATAPRES) 0.2 MG tablet Take 1 tablet (0.2 mg  total) by mouth 3 (three) times daily. 270 tablet 0  . cyclobenzaprine (FLEXERIL) 10 MG tablet Take 1 tablet (10 mg total) by mouth 3 (three) times daily as needed for muscle spasms. 30 tablet 0  . lisinopril (PRINIVIL,ZESTRIL) 10 MG tablet Take 10 mg by mouth daily.    [name] ON 11/12/2019] lithium carbonate (ESKALITH) 450 MG CR tablet Take 1 tablet (450 mg total) by mouth at bedtime. Take with 300 mg to equal 750 mg at bedtime 90 tablet 0  . [START ON 11/13/2019] lithium carbonate (LITHOBID) 300 MG CR tablet Take total of 750 mg at night along with 400 mg tab 90 tablet 0  . Multiple Vitamin (MULTIVITAMIN WITH MINERALS) TABS tablet Take 1 tablet by mouth daily.    [name] omeprazole (PRILOSEC) 20 MG capsule Take 20 mg by mouth daily.    [name] oxyCODONE-acetaminophen (PERCOCET) 10-325 MG tablet Take 1 tablet by mouth every 8 (eight) hours as needed for up to 7 days for pain. 21 tablet 0  . [START ON 12/12/2019] risperidone (RISPERDAL) 4 MG tablet Take 1 tablet (4 mg total) by mouth at bedtime. 90 tablet 0  . [START ON 10/03/2019] sertraline (ZOLOFT) 100 MG tablet Take 1.5 tablets (150 mg total) by mouth daily. 135 tablet 0  . [START ON 11/12/2019] traZODone (DESYREL) 150 MG tablet Take 1 tablet (150 mg total) by mouth at bedtime. 90 tablet 0  . zinc gluconate 50 MG tablet Take 50 mg by mouth daily.     No current facility-administered medications for this visit.    No Known Allergies  Review of Systems  Less pain at the surgical site No fever No significant bleeding  BP 125/83 (BP Location: Left Arm, Patient Position: Sitting, Cuff Size: Normal)   Pulse (!) 122   Temp 98.4 F (36.9 C)   Resp 16 Comment: RA  Ht 6' (1.829 m)   Wt 278 lb (126.1 kg)   SpO2 97% Comment: RA  BMI 37.70 kg/m  Physical Exam      Exam    General- alert and comfortable Right sternoclavicular joint wound with good granulation tissue and showing contraction    Neck- no JVD, no cervical adenopathy palpable, no carotid  bruit   Lungs- clear without rales, wheezes   Cor- regular rate and rhythm, no murmur , gallop   Abdomen- soft, non-tender   Extremities - warm, non-tender, minimal edema   Neuro- oriented, appropriate, no focal weakness   Diagnostic Tests: None  Impression: Continued improvement in wound. We will continue to apply activated human collagen every other day in the office.  He will return for wound dressing change on Friday, August 20.  Plan: Return in 2 days for wound dressing change and application of more activated  human collagen to wound  [name], MD [facility] [phone]

## 2019-10-03 ENCOUNTER — Other Ambulatory Visit: Payer: Self-pay

## 2019-10-03 ENCOUNTER — Ambulatory Visit (INDEPENDENT_AMBULATORY_CARE_PROVIDER_SITE_OTHER): Payer: Self-pay | Admitting: Physician Assistant

## 2019-10-03 VITALS — BP 120/83 | HR 120 | Temp 97.7°F | Resp 20 | Ht 72.0 in | Wt 280.0 lb

## 2019-10-03 DIAGNOSIS — Z5189 Encounter for other specified aftercare: Secondary | ICD-10-CM

## 2019-10-03 DIAGNOSIS — M009 Pyogenic arthritis, unspecified: Secondary | ICD-10-CM

## 2019-10-03 DIAGNOSIS — Z4800 Encounter for change or removal of nonsurgical wound dressing: Secondary | ICD-10-CM

## 2019-10-03 NOTE — Progress Notes (Signed)
Mr. Carlberg presents today for wound check.  He states his wound is doing well.  They have been changing the wound daily.   Wound: Clean and dry, no purulence noted, healthy granulation tissue present  A/P:  1. Cellerate gel applied to wound, packed wet to dry. 2. Patient will come back to office Wednesday per their request to see Dr. Zachery Conch, PA-C

## 2019-10-07 ENCOUNTER — Ambulatory Visit: Payer: Self-pay | Admitting: Cardiothoracic Surgery

## 2019-10-07 ENCOUNTER — Ambulatory Visit (INDEPENDENT_AMBULATORY_CARE_PROVIDER_SITE_OTHER): Payer: BC Managed Care – PPO | Admitting: Orthopaedic Surgery

## 2019-10-07 ENCOUNTER — Ambulatory Visit: Payer: Self-pay

## 2019-10-07 VITALS — Ht 71.0 in | Wt 273.8 lb

## 2019-10-07 DIAGNOSIS — M009 Pyogenic arthritis, unspecified: Secondary | ICD-10-CM

## 2019-10-07 DIAGNOSIS — G8929 Other chronic pain: Secondary | ICD-10-CM | POA: Diagnosis not present

## 2019-10-07 DIAGNOSIS — M25511 Pain in right shoulder: Secondary | ICD-10-CM | POA: Diagnosis not present

## 2019-10-07 DIAGNOSIS — M869 Osteomyelitis, unspecified: Secondary | ICD-10-CM

## 2019-10-07 NOTE — Progress Notes (Signed)
Office Visit Note   Patient: Herbert Walsh           Date of Birth: 1990-12-02           MRN: [id number] Visit Date: 10/07/2019              Requested by: Ignatius Specking, MD [address] [place],  [place] [address] PCP: Ignatius Specking, MD   Assessment & Plan: Visit Diagnoses:  1. Chronic right shoulder pain     Plan: Impression is right AC joint effusion.  MRI findings reviewed with the patient.  I think it is best to rule out infection first before we inject this with cortisone.  We will have Dr. Prince Rome aspirate today under ultrasound and sent for cell count and cultures.  If these turn out to be negative we will bring the patient back for cortisone injection.  Follow-Up Instructions: Return if symptoms worsen or fail to improve.   Orders:  No orders of the defined types were placed in this encounter.  No orders of the defined types were placed in this encounter.     Procedures: No procedures performed   Clinical Data: No additional findings.   Subjective: No chief complaint on file.   Kruz is a follow-up from the hospital for right shoulder pain.  MRI showed an effusion of the [facility] joint.  Due to his recent history of septic arthritis of his right knee and right [place] joint he presents today for evaluation.  Denies any constitutional symptoms.  Denies any redness or swelling of the right shoulder region.   Review of Systems  Constitutional: Negative.   All other systems reviewed and are negative.    Objective: Vital Signs: Ht 5\' 11"  (1.803 m)   Wt 273 lb 12.8 oz (124.2 kg)   BMI 38.19 kg/m   Physical Exam Vitals and nursing note reviewed.  Constitutional:      Appearance: He is well-developed.  Pulmonary:     Effort: Pulmonary effort is normal.  Abdominal:     Palpations: Abdomen is soft.  Skin:    General: Skin is warm.  Neurological:     Mental Status: He is alert and oriented to person, place, and time.  Psychiatric:        Behavior: Behavior normal.         Thought Content: Thought content normal.        Judgment: Judgment normal.     Ortho Exam Right shoulder shows no swelling or temperature changes or skin changes.  Neurovascular intact.  No significant discomfort with cross body adduction or palpation of the AC joint.  Manual muscle testing is normal. Specialty Comments:  No specialty comments available.  Imaging: No results found.   PMFS History: Patient Active Problem List   Diagnosis Date Noted  . Visit for wound care 09/29/2019  . Encounter for postoperative wound check 09/24/2019  . Dressing change 09/03/2019  . Draining cutaneous sinus tract 08/27/2019  . Open wound of chest wall with complication, right, sequela 08/20/2019  . Wound check, abscess 08/06/2019  . Neuropathic pain of chest 07/23/2019  . Encounter for wound re-check 07/17/2019  . Open wound of chest wall 07/09/2019  . Chest wall abscess 07/02/2019  . Osteomyelitis (HCC) 06/20/2019  . MSSA (methicillin susceptible Staphylococcus aureus) infection 06/20/2019  . Sternoclavicular (joint) (ligament) sprain 06/18/2019  . Sternal osteomyelitis (HCC) 06/18/2019  . Septic arthritis of knee, right (HCC) 05/31/2019  . Myofascitis 05/31/2019  . Prediabetes 05/31/2019  .  MSSA bacteremia 05/31/2019  . Septic arthritis of right sternoclavicular joint (HCC) 05/30/2019  . Rheumatoid arthritis (HCC) 05/30/2019  . Glucosuria 05/30/2019  . 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 Difficulty controlling anger 03/19/2019  . PTSD (post-traumatic stress disorder) 11/01/2017   Past Medical History:  Diagnosis Date  . Anemia   . Arthritis    rheumatoid  . Asthma   . Bipolar disorder (HCC)   . Depression   . GERD (gastroesophageal reflux disease)   . Hypertension 2019  . Ischemic colitis (HCC) 09/2008  . MSSA (methicillin susceptible Staphylococcus aureus)    sternaoclavicular absecess drainage  . Osteoarthritis    sterum and right shoulder  . PTSD (post-traumatic stress  disorder)   . Tachycardia     Family History  Problem Relation Age of Onset  . Alcohol abuse Mother   . Depression Mother   . Drug abuse Mother   . Physical abuse Mother   . Sexual abuse Mother   . ADD / ADHD Paternal Aunt   . Alcohol abuse Maternal Grandfather   . Bipolar disorder Maternal Grandmother   . Schizophrenia Maternal Grandmother   . Drug abuse Maternal Grandmother   . Alcohol abuse Paternal Grandfather   . Depression Paternal Grandfather   . Depression Paternal Grandmother   . Drug abuse Paternal Grandmother     Past Surgical History:  Procedure Laterality Date  . APPENDECTOMY    . APPLICATION OF WOUND VAC Right 06/20/2019   Procedure: APPLICATION OF WOUND VAC;  Surgeon: [name], MD;  Location: [facility] OR;  Service: Vascular;  Laterality: Right;  . APPLICATION OF WOUND VAC Right 06/23/2019   Procedure: APPLICATION OF WOUND VAC;  Surgeon: [name], MD;  Location: [facility] OR;  Service: Thoracic;  Laterality: Right;  . COLONOSCOPY     x 2  . I & D EXTREMITY Right 05/31/2019   Procedure: IRRIGATION AND DEBRIDEMENT LOWER EXTREMITY;  Surgeon: Tarry Kos, MD;  Location: [facility] OR;  Service: Orthopedics;  Laterality: Right;  . I & D EXTREMITY Right 06/20/2019   Procedure: DEBRIDEMENT OF STERNOCLAVICULAR JOINT ABSCESS;  Surgeon: [name], MD;  Location: [facility] OR;  Service: Vascular;  Laterality: Right;  . STERNAL WOUND DEBRIDEMENT Right 09/19/2019   Procedure: EXCISIONAL DEBRIDEMENT AND IRRIGATION OF RIGHT STERNOCLAVICULAR JOINT WOUND;  Surgeon: [name], MD;  Location: [facility] OR;  Service: Thoracic;  Laterality: Right;  . TEE WITHOUT CARDIOVERSION N/A 06/05/2019   Procedure: TRANSESOPHAGEAL ECHOCARDIOGRAM (TEE);  Surgeon: Lewayne Bunting, MD;  Location: [facility] ENDOSCOPY;  Service: Cardiovascular;  Laterality: N/A;  . WOUND EXPLORATION Right 06/23/2019   Procedure: irrigation and clean out right shoulder;  Surgeon: [name], MD;  Location: [facility] OR;  Service:  Thoracic;  Laterality: Right;   Social History   Occupational History  . Not on file  Tobacco Use  . Smoking status: Current Every Day Smoker    Packs/day: 0.50    Years: 15.00    Pack years: 7.50    Types: Cigarettes  . Smokeless tobacco: Never Used  Vaping Use  . Vaping Use: Never used  Substance and Sexual Activity  . Alcohol use: No    Comment: rare beer  . Drug use: No  . Sexual activity: Not Currently

## 2019-10-07 NOTE — Progress Notes (Signed)
Subjective: He is here for right AC joint aspiration.  He has a history of septic arthritis in his sternoclavicular joint in the past.  Recent MRI scan of the shoulder showed a small AC joint effusion with concern for infection.  No bony changes suggesting osteomyelitis.  Objective: He is tender near the [facility] joint and also in the posterior shoulder.  Procedure: Right shoulder AC joint aspiration: After sterile prep with Betadine, injected 3 cc 1% lidocaine without epinephrine, then using an 18-gauge needle aspirated a scant amount of clear synovial fluid.  We will send it for analysis as requested.

## 2019-10-09 ENCOUNTER — Ambulatory Visit: Payer: Self-pay | Admitting: Cardiothoracic Surgery

## 2019-10-10 ENCOUNTER — Ambulatory Visit: Payer: Self-pay | Admitting: Cardiothoracic Surgery

## 2019-10-10 ENCOUNTER — Other Ambulatory Visit: Payer: Self-pay | Admitting: Cardiothoracic Surgery

## 2019-10-10 MED ORDER — CEPHALEXIN 500 MG PO CAPS: 500.0000 mg | ORAL_CAPSULE | Freq: Three times a day (TID) | ORAL | 0 refills | Status: DC

## 2019-10-13 LAB — ANAEROBIC AND AEROBIC CULTURE
AER RESULT:: NO GROWTH
GRAM STAIN:: NONE SEEN
MICRO NUMBER:: 10864941
MICRO NUMBER:: 10864942
SPECIMEN QUALITY:: ADEQUATE
SPECIMEN QUALITY:: ADEQUATE

## 2019-10-13 LAB — SYNOVIAL CELL COUNT + DIFF, W/ CRYSTALS

## 2019-10-15 ENCOUNTER — Telehealth (HOSPITAL_COMMUNITY): Payer: Self-pay | Admitting: *Deleted

## 2019-10-15 ENCOUNTER — Other Ambulatory Visit (HOSPITAL_COMMUNITY): Payer: Self-pay | Admitting: Psychiatry

## 2019-10-15 ENCOUNTER — Other Ambulatory Visit: Payer: Self-pay

## 2019-10-15 ENCOUNTER — Ambulatory Visit (INDEPENDENT_AMBULATORY_CARE_PROVIDER_SITE_OTHER): Payer: Self-pay | Admitting: Cardiothoracic Surgery

## 2019-10-15 ENCOUNTER — Encounter: Payer: Self-pay | Admitting: Cardiothoracic Surgery

## 2019-10-15 VITALS — BP 116/87 | HR 134 | Temp 100.0°F | Resp 16 | Ht 71.0 in | Wt 273.0 lb

## 2019-10-15 DIAGNOSIS — S21101D Unspecified open wound of right front wall of thorax without penetration into thoracic cavity, subsequent encounter: Secondary | ICD-10-CM

## 2019-10-15 DIAGNOSIS — M009 Pyogenic arthritis, unspecified: Secondary | ICD-10-CM

## 2019-10-15 DIAGNOSIS — F331 Major depressive disorder, recurrent, moderate: Secondary | ICD-10-CM

## 2019-10-15 DIAGNOSIS — Z5189 Encounter for other specified aftercare: Secondary | ICD-10-CM

## 2019-10-15 MED ORDER — TRAMADOL HCL 50 MG PO TABS: 100.0000 mg | ORAL_TABLET | Freq: Four times a day (QID) | ORAL | 0 refills | Status: AC | PRN

## 2019-10-15 NOTE — Telephone Encounter (Signed)
Ordered (TSH and lithium level)

## 2019-10-15 NOTE — Telephone Encounter (Signed)
noted 

## 2019-10-15 NOTE — Telephone Encounter (Signed)
Patient is wanting his TSH order to be sent to [facility] not [facility]. Please enter in he is currently there.

## 2019-10-15 NOTE — Progress Notes (Signed)
PCP is Ignatius Specking, MD Referring Provider is Ignatius Specking, MD  Chief Complaint  Patient presents with  . Routine Post Op    wound check with dressing change    HPI: Patient presents for surgical dressing change of his right sternoclavicular joint.  He has a clean granulation bed and measures approximately 2 and half to 3 cm deep.  Is being treated with activated human collagen and packed with calcium alginate strips.  The collagen is applied weekly in the office in the calcium alginate strips are changed daily at home.  He is finishing a course of oral Keflex.  The patient had a right shoulder aspirate by Dr. Birdie Sons and the results showed no organisms no bacteria.  He is still having pain in his shoulder and we will transition from oxycodone to tramadol.    Past Surgical History:  Procedure Laterality Date  . APPENDECTOMY    . APPLICATION OF WOUND VAC Right 06/20/2019   Procedure: APPLICATION OF WOUND VAC;  Surgeon: [name], MD;  Location: [facility] OR;  Service: Vascular;  Laterality: Right;  . APPLICATION OF WOUND VAC Right 06/23/2019   Procedure: APPLICATION OF WOUND VAC;  Surgeon: [name], MD;  Location: [facility] OR;  Service: Thoracic;  Laterality: Right;  . COLONOSCOPY     x 2  . I & D EXTREMITY Right 05/31/2019   Procedure: IRRIGATION AND DEBRIDEMENT LOWER EXTREMITY;  Surgeon: Tarry Kos, MD;  Location: [facility] OR;  Service: Orthopedics;  Laterality: Right;  . I & D EXTREMITY Right 06/20/2019   Procedure: DEBRIDEMENT OF STERNOCLAVICULAR JOINT ABSCESS;  Surgeon: [name], MD;  Location: [facility] OR;  Service: Vascular;  Laterality: Right;  . STERNAL WOUND DEBRIDEMENT Right 09/19/2019   Procedure: EXCISIONAL DEBRIDEMENT AND IRRIGATION OF RIGHT STERNOCLAVICULAR JOINT WOUND;  Surgeon: [name], MD;  Location: [facility] OR;  Service: Thoracic;  Laterality: Right;  . TEE WITHOUT CARDIOVERSION N/A 06/05/2019   Procedure: TRANSESOPHAGEAL ECHOCARDIOGRAM (TEE);  Surgeon: Lewayne Bunting,  MD;  Location: [facility] ENDOSCOPY;  Service: Cardiovascular;  Laterality: N/A;  . WOUND EXPLORATION Right 06/23/2019   Procedure: irrigation and clean out right shoulder;  Surgeon: [name], MD;  Location: [facility] OR;  Service: Thoracic;  Laterality: Right;    Family History  Problem Relation Age of Onset  . Alcohol abuse Mother   . Depression Mother   . Drug abuse Mother   . Physical abuse Mother   . Sexual abuse Mother   . ADD / ADHD Paternal Aunt   . Alcohol abuse Maternal Grandfather   . Bipolar disorder Maternal Grandmother   . Schizophrenia Maternal Grandmother   . Drug abuse Maternal Grandmother   . Alcohol abuse Paternal Grandfather   . Depression Paternal Grandfather   . Depression Paternal Grandmother   . Drug abuse Paternal Grandmother     Social History Social History   Tobacco Use  . Smoking status: Current Every Day Smoker    Packs/day: 0.50    Years: 15.00    Pack years: 7.50    Types: Cigarettes  . Smokeless tobacco: Never Used  Vaping Use  . Vaping Use: Never used  Substance Use Topics  . Alcohol use: No    Comment: rare beer  . Drug use: No    Current Outpatient Medications  Medication Sig Dispense Refill  . atorvastatin (LIPITOR) 10 MG tablet Take 10 mg by mouth at bedtime.   12  . cephALEXin (KEFLEX) 500 MG capsule  Take 1 capsule (500 mg total) by mouth 3 (three) times daily. 21 capsule 0  . Cholecalciferol (VITAMIN D3) 50 MCG (2000 UT) TABS Take 2,000 mg by mouth daily.    [name] ON 11/11/2019] cloNIDine (CATAPRES) 0.2 MG tablet Take 1 tablet (0.2 mg total) by mouth 3 (three) times daily. 270 tablet 0  . cyclobenzaprine (FLEXERIL) 10 MG tablet Take 1 tablet (10 mg total) by mouth 3 (three) times daily as needed for muscle spasms. 30 tablet 0  . lisinopril (PRINIVIL,ZESTRIL) 10 MG tablet Take 10 mg by mouth daily.    [name] ON 11/12/2019] lithium carbonate (ESKALITH) 450 MG CR tablet Take 1 tablet (450 mg total) by mouth at bedtime. Take with 300 mg  to equal 750 mg at bedtime 90 tablet 0  . [START ON 11/13/2019] lithium carbonate (LITHOBID) 300 MG CR tablet Take total of 750 mg at night along with 400 mg tab 90 tablet 0  . Multiple Vitamin (MULTIVITAMIN WITH MINERALS) TABS tablet Take 1 tablet by mouth daily.    [name] omeprazole (PRILOSEC) 20 MG capsule Take 20 mg by mouth daily.    [name] ON 12/12/2019] risperidone (RISPERDAL) 4 MG tablet Take 1 tablet (4 mg total) by mouth at bedtime. 90 tablet 0  . sertraline (ZOLOFT) 100 MG tablet Take 1.5 tablets (150 mg total) by mouth daily. 135 tablet 0  . [START ON 11/12/2019] traZODone (DESYREL) 150 MG tablet Take 1 tablet (150 mg total) by mouth at bedtime. 90 tablet 0  . zinc gluconate 50 MG tablet Take 50 mg by mouth daily.    . traMADol (ULTRAM) 50 MG tablet Take 2 tablets (100 mg total) by mouth every 6 (six) hours as needed for up to 7 days for moderate pain. 40 tablet 0   No current facility-administered medications for this visit.    No Known Allergies  Review of Systems No fever Less pain in area of sternoclavicular joint  BP 116/87 (BP Location: Left Arm, Patient Position: Sitting, Cuff Size: Large)   Pulse (!) 134   Temp 100 F (37.8 C)   Resp 16   Ht 5\' 11"  (1.803 m)   Wt 273 lb (123.8 kg)   SpO2 96% Comment: RA  BMI 38.08 kg/m  Physical Exam      Exam    General- alert and comfortable Sternoclavicular joint dressing personally changed.  Good granulation tissue to the base.  1.5 cc of activated human collagen followed by calcium alginate packing.    Neck- no JVD, no cervical adenopathy palpable, no carotid bruit   Lungs- clear without rales, wheezes   Cor- regular rate and rhythm, no murmur , gallop   Abdomen- soft, non-tender   Extremities - warm, non-tender, minimal edema   Neuro- oriented, appropriate, no focal weakness   Diagnostic Tests: None  Impression: Slowly healing right sternoclavicular wound.  Continue current wound care and weekly office  visits.  Plan: Return in 1 week for wound inspection and dressing change.  He is encouraged to add protein supplement drinks daily.  , MD [facility] [phone]

## 2019-10-16 ENCOUNTER — Encounter (HOSPITAL_COMMUNITY): Payer: Self-pay | Admitting: Psychiatry

## 2019-10-16 LAB — TSH: TSH: 2.94 mIU/L (ref 0.40–4.50)

## 2019-10-16 LAB — LITHIUM LEVEL: Lithium Lvl: 0.5 mmol/L — ABNORMAL LOW (ref 0.6–1.2)

## 2019-10-16 NOTE — Telephone Encounter (Signed)
This encounter was created in error - please disregard.

## 2019-10-16 NOTE — Addendum Note (Signed)
Addended by: Neysa Hotter on: 10/16/2019 08:38 AM   Modules accepted: Level of Service, SmartSet

## 2019-10-22 ENCOUNTER — Ambulatory Visit: Payer: Self-pay | Admitting: Cardiothoracic Surgery

## 2019-10-23 ENCOUNTER — Ambulatory Visit: Payer: BC Managed Care – PPO | Admitting: Orthopaedic Surgery

## 2019-11-03 ENCOUNTER — Other Ambulatory Visit: Payer: Self-pay | Admitting: Cardiothoracic Surgery

## 2019-11-03 ENCOUNTER — Other Ambulatory Visit: Payer: Self-pay

## 2019-11-03 DIAGNOSIS — S21101D Unspecified open wound of right front wall of thorax without penetration into thoracic cavity, subsequent encounter: Secondary | ICD-10-CM

## 2019-11-04 ENCOUNTER — Ambulatory Visit: Payer: Self-pay | Admitting: Cardiothoracic Surgery

## 2019-11-04 ENCOUNTER — Ambulatory Visit: Payer: BC Managed Care – PPO | Admitting: Orthopaedic Surgery

## 2019-11-05 ENCOUNTER — Other Ambulatory Visit: Payer: Self-pay

## 2019-11-05 ENCOUNTER — Ambulatory Visit (INDEPENDENT_AMBULATORY_CARE_PROVIDER_SITE_OTHER): Payer: Self-pay | Admitting: Cardiothoracic Surgery

## 2019-11-05 ENCOUNTER — Encounter: Payer: Self-pay | Admitting: Cardiothoracic Surgery

## 2019-11-05 ENCOUNTER — Ambulatory Visit
Admission: RE | Admit: 2019-11-05 | Discharge: 2019-11-05 | Disposition: A | Discharge status: Home / Self Care | Payer: BC Managed Care – PPO | Source: Ambulatory Visit | Attending: Cardiothoracic Surgery | Admitting: Cardiothoracic Surgery

## 2019-11-05 VITALS — BP 125/85 | HR 110 | Resp 20 | Ht 71.0 in | Wt 270.0 lb

## 2019-11-05 DIAGNOSIS — S21101S Unspecified open wound of right front wall of thorax without penetration into thoracic cavity, sequela: Secondary | ICD-10-CM

## 2019-11-05 DIAGNOSIS — S21101D Unspecified open wound of right front wall of thorax without penetration into thoracic cavity, subsequent encounter: Secondary | ICD-10-CM

## 2019-11-05 DIAGNOSIS — M009 Pyogenic arthritis, unspecified: Secondary | ICD-10-CM

## 2019-11-05 IMAGING — CT CT CHEST W/ CM
2 of 4 series · 13 of 36 positions shown, 16 images · IV contrast (iopamidol)
Comparison: [DATE].

CLINICAL DATA: Wound dehiscence.

EXAM:
CT CHEST WITH CONTRAST
TECHNIQUE: Multidetector CT imaging of the chest was performed during
intravenous contrast administration.
CONTRAST:  75mL [IJ] IOPAMIDOL ([IJ]) INJECTION 61%

[Series 2: chest 2.00 br40 s3 · axial · 0.73mm/px · z∈[+1437,+1697]mm · 10 of 154 slices shown, 13 images (1 of 2)]
[im 12/154  mediastinal]
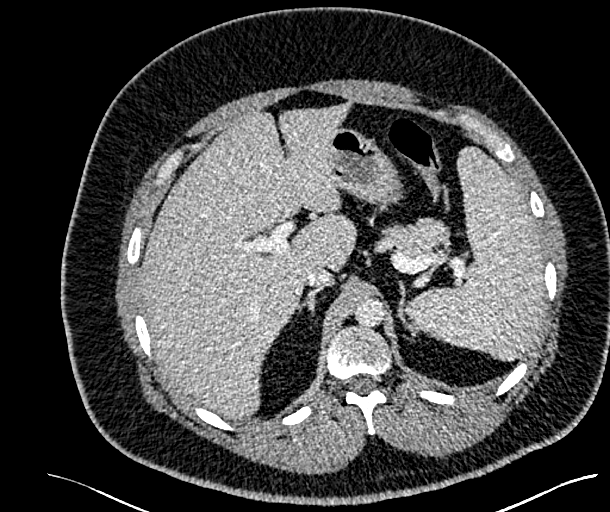
[im 12/154  lung]
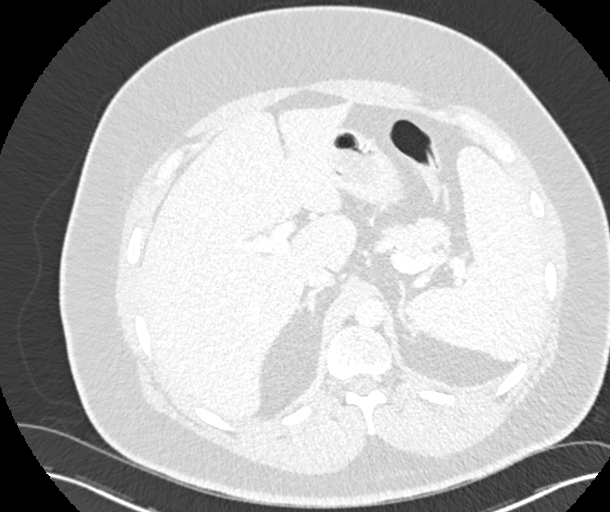
[im 24/154  lung]
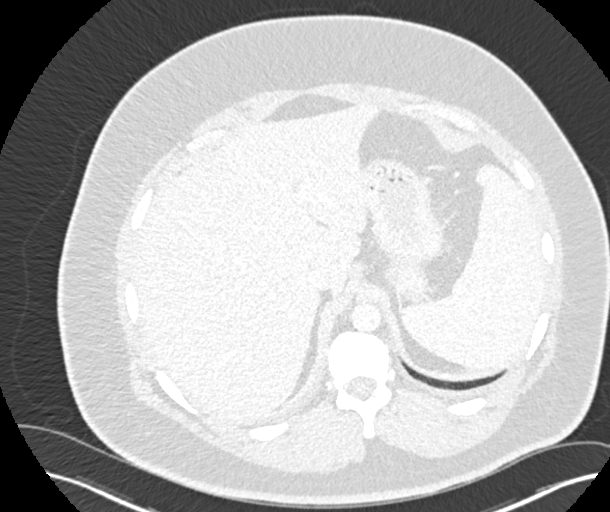
[im 48/154  lung]
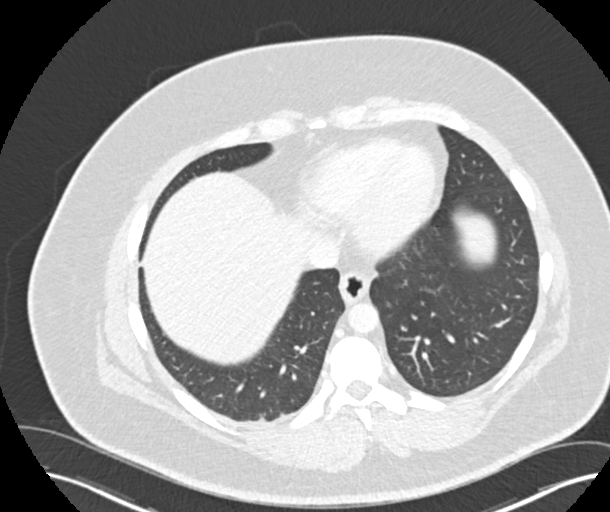
[im 59/154  lung]
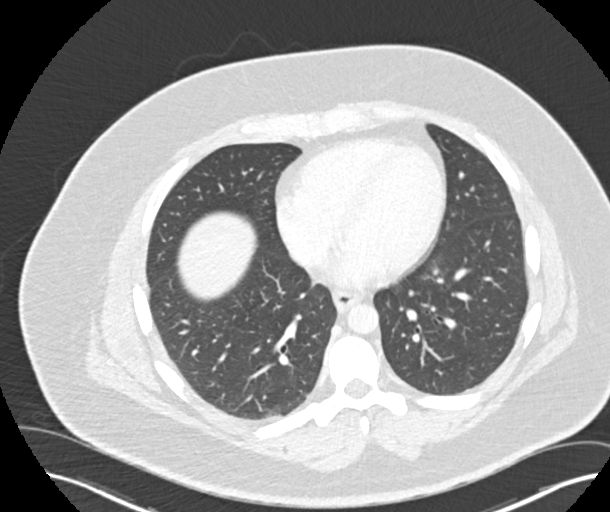
[im 71/154  mediastinal]
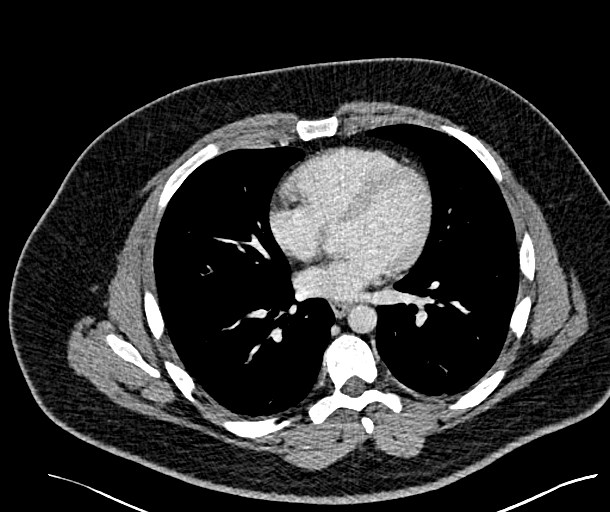
[im 71/154  lung]
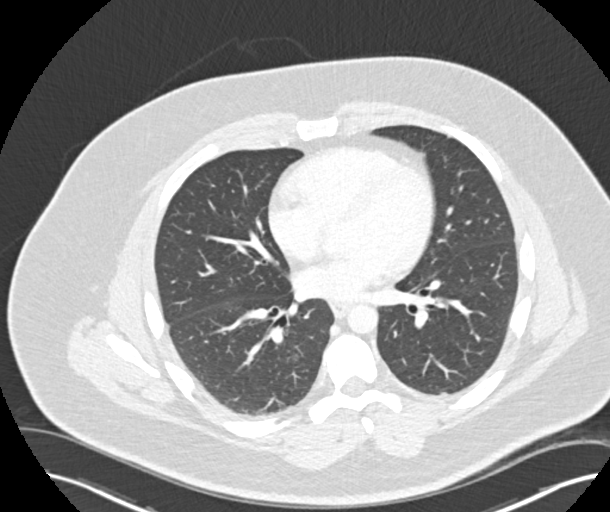
[im 83/154  lung]
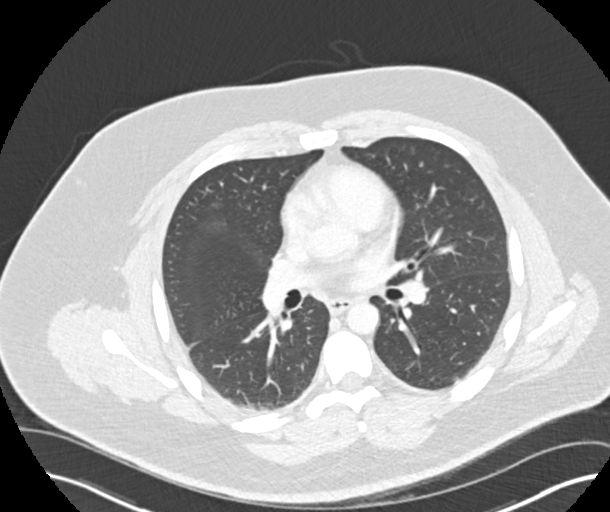
[im 95/154  lung]
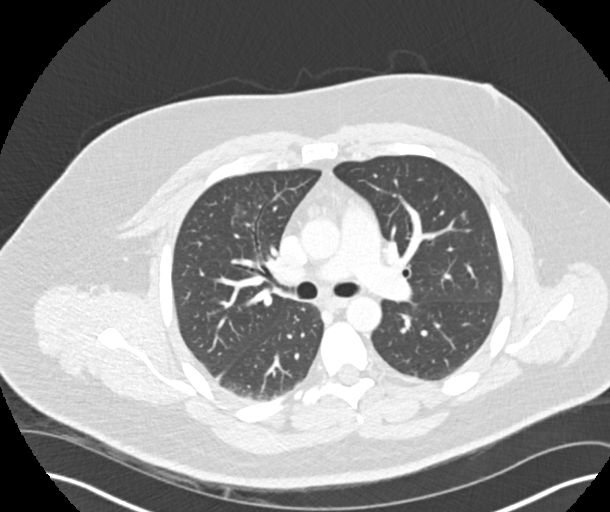
[im 118/154  lung]
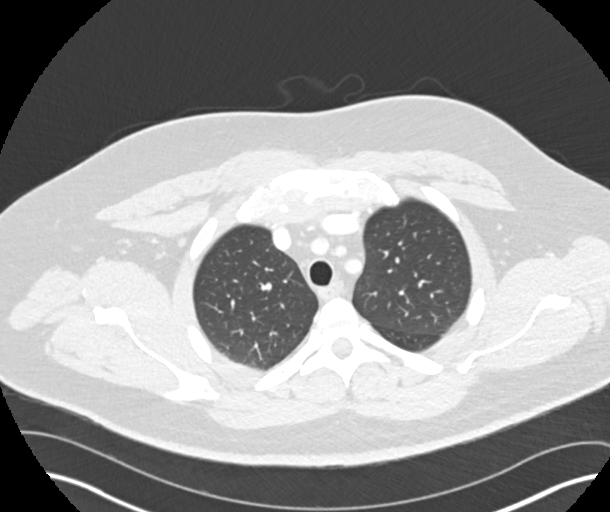
[im 130/154  mediastinal]
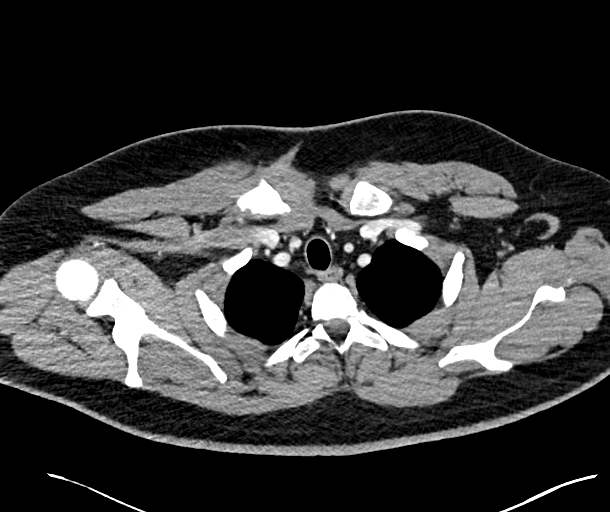
[im 130/154  lung]
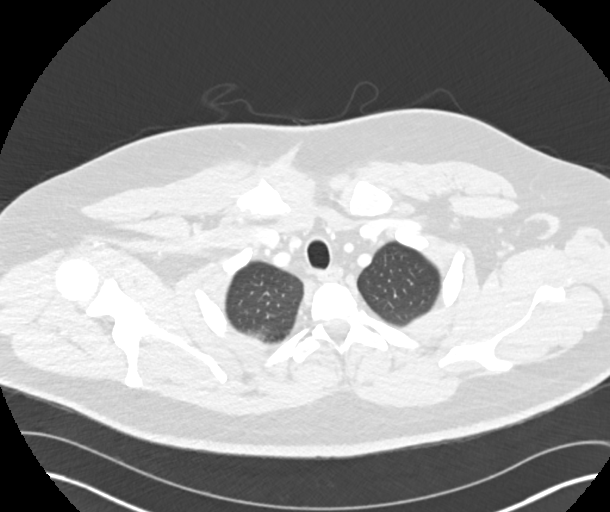
[im 142/154  lung]
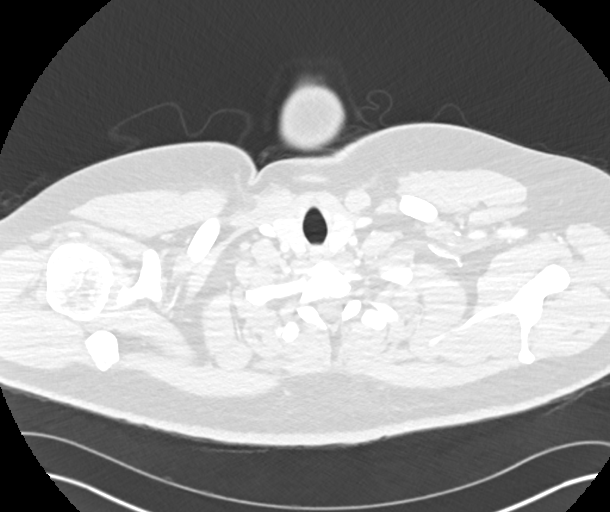

[Series 4: chest 2.00 br40 s3 · coronal · 0.60mm/px · 3 of 186 slices shown (2 of 2)]
[im 38/186  lung]
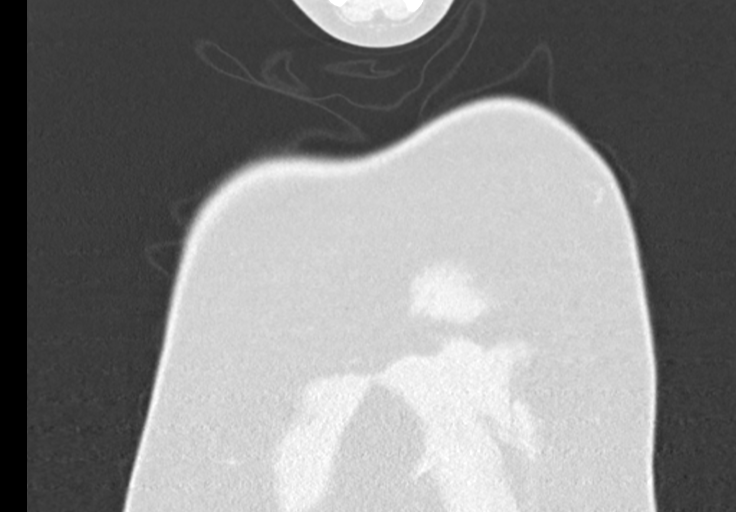
[im 75/186  lung]
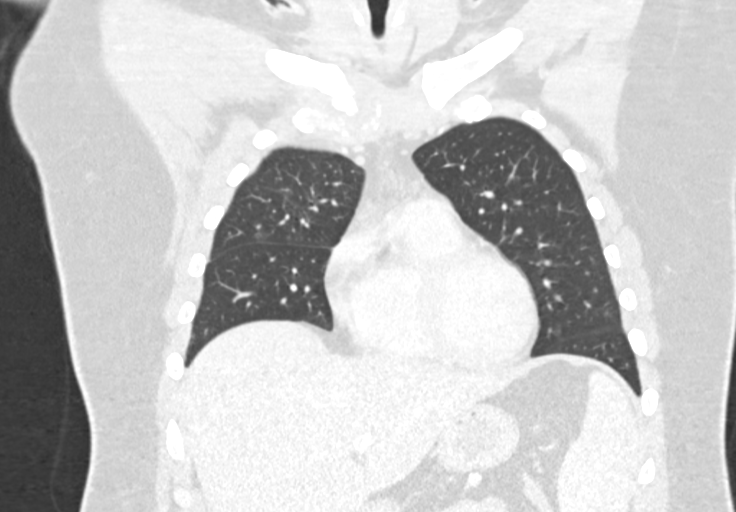
[im 112/186  lung]
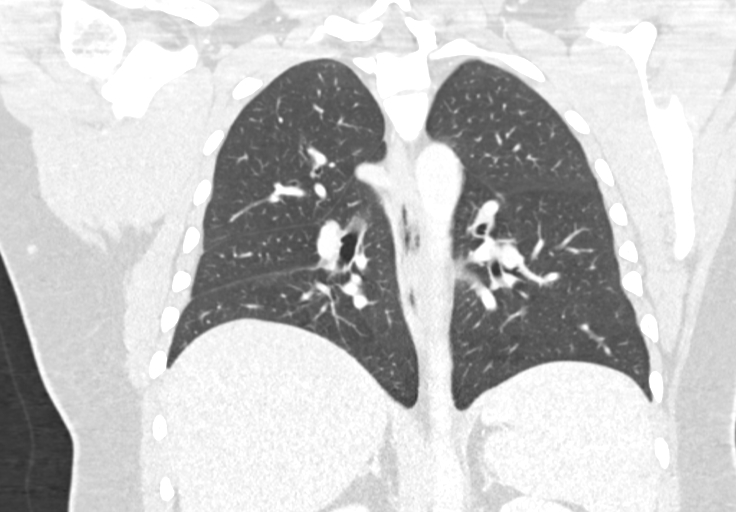

[13 of 36 positions shown; findings below may reference images not displayed]

FINDINGS: Cardiovascular: No significant vascular findings. Normal heart size.
No pericardial effusion.

Mediastinum/Nodes: No enlarged mediastinal, hilar, or axillary lymph
nodes. Thyroid gland, trachea, and esophagus demonstrate no
significant findings.

Lungs/Pleura: Lungs are clear. No pleural effusion or pneumothorax.

Upper Abdomen: No acute abnormality.

Musculoskeletal: Stable appearance of defects involving the medial
end of the right clavicle as well as the right side of the manubrium
consistent with prior osteotomy of the right sternoclavicular joint.
No new lytic destruction is seen to suggest acute osteomyelitis.
Overlying surgical wound is unchanged in appearance. No acute
osseous abnormality is noted.
IMPRESSION: 1. Stable appearance of defects involving the medial end of the
right clavicle as well as the right side of the manubrium consistent
with prior osteotomy of the right sternoclavicular joint. No new
lytic destruction is seen to suggest acute osteomyelitis. Overlying
surgical wound is unchanged in appearance.
2. No other abnormality seen in the chest.

## 2019-11-05 MED ORDER — IOPAMIDOL (ISOVUE-300) INJECTION 61%
75.0000 mL | Freq: Once | INTRAVENOUS | Status: AC | PRN
  Administered 2019-11-05: 75 mL via INTRAVENOUS

## 2019-11-05 NOTE — Progress Notes (Signed)
PCP is Ignatius Specking, MD Referring Provider is Ignatius Specking, MD  Chief Complaint  Patient presents with  . Routine Post Op    wound check with Chest CT    HPI: Patient returns for office visit 3 weeks late with repeat chest x-ray to help assess healing of his right sternal clavicular joint infection.  He is instructed to pack the wound daily which tracks from the small opening in the skin posteriorly and medially approximately 3 cm.  It is approximately 8 mm wide.  Today I cultured the wound.  I cleaned it and then packed it with a calcium alginate narrow strip and applied a sterile dressing.  The patient's most recent cultures have been negative.  I repeated a wound culture today with the dressing change.  The patient had a CT scan of the chest today to assess the healing of the mediastinal tissues and to rule out recurrent osteomyelitis since he has had persistent pain and his wound dressing changes schedule has probably not been optimal at home.  I reviewed the images personally and the tissues of the sternoclavicular joint show some postoperative changes but no evidence of osteomyelitis or active infection.  No fluid collection.  I applied a 8 mm strip of calcium alginate 3 cm to the depth of the wound and the patient was shown how to do this and understands that should be done daily. Past Medical History:  Diagnosis Date  . Anemia   . Arthritis    rheumatoid  . Asthma   . Bipolar disorder (HCC)   . Depression   . GERD (gastroesophageal reflux disease)   . Hypertension 2019  . Ischemic colitis (HCC) 09/2008  . MSSA (methicillin susceptible Staphylococcus aureus)    sternaoclavicular absecess drainage  . Osteoarthritis    sterum and right shoulder  . PTSD (post-traumatic stress disorder)   . Tachycardia     Past Surgical History:  Procedure Laterality Date  . APPENDECTOMY    . APPLICATION OF WOUND VAC Right 06/20/2019   Procedure: APPLICATION OF WOUND VAC;  Surgeon: [name], MD;  Location: [facility] OR;  Service: Vascular;  Laterality: Right;  . APPLICATION OF WOUND VAC Right 06/23/2019   Procedure: APPLICATION OF WOUND VAC;  Surgeon: [name], MD;  Location: [facility] OR;  Service: Thoracic;  Laterality: Right;  . COLONOSCOPY     x 2  . I & D EXTREMITY Right 05/31/2019   Procedure: IRRIGATION AND DEBRIDEMENT LOWER EXTREMITY;  Surgeon: Tarry Kos, MD;  Location: [facility] OR;  Service: Orthopedics;  Laterality: Right;  . I & D EXTREMITY Right 06/20/2019   Procedure: DEBRIDEMENT OF STERNOCLAVICULAR JOINT ABSCESS;  Surgeon: [name], MD;  Location: [facility] OR;  Service: Vascular;  Laterality: Right;  . STERNAL WOUND DEBRIDEMENT Right 09/19/2019   Procedure: EXCISIONAL DEBRIDEMENT AND IRRIGATION OF RIGHT STERNOCLAVICULAR JOINT WOUND;  Surgeon: [name], MD;  Location: [facility] OR;  Service: Thoracic;  Laterality: Right;  . TEE WITHOUT CARDIOVERSION N/A 06/05/2019   Procedure: TRANSESOPHAGEAL ECHOCARDIOGRAM (TEE);  Surgeon: Lewayne Bunting, MD;  Location: [facility] ENDOSCOPY;  Service: Cardiovascular;  Laterality: N/A;  . WOUND EXPLORATION Right 06/23/2019   Procedure: irrigation and clean out right shoulder;  Surgeon: [name], MD;  Location: [facility] OR;  Service: Thoracic;  Laterality: Right;    Family History  Problem Relation Age of Onset  . Alcohol abuse Mother   . Depression Mother   . Drug abuse Mother   .  Physical abuse Mother   . Sexual abuse Mother   . ADD / ADHD Paternal Aunt   . Alcohol abuse Maternal Grandfather   . Bipolar disorder Maternal Grandmother   . Schizophrenia Maternal Grandmother   . Drug abuse Maternal Grandmother   . Alcohol abuse Paternal Grandfather   . Depression Paternal Grandfather   . Depression Paternal Grandmother   . Drug abuse Paternal Grandmother     Social History Social History   Tobacco Use  . Smoking status: Current Every Day Smoker    Packs/day: 0.50    Years: 15.00    Pack years: 7.50    Types: Cigarettes   . Smokeless tobacco: Never Used  Vaping Use  . Vaping Use: Never used  Substance Use Topics  . Alcohol use: No    Comment: rare beer  . Drug use: No    Current Outpatient Medications  Medication Sig Dispense Refill  . atorvastatin (LIPITOR) 10 MG tablet Take 10 mg by mouth at bedtime.   12  . Cholecalciferol (VITAMIN D3) 50 MCG (2000 UT) TABS Take 2,000 mg by mouth daily.    [name] ON 11/11/2019] cloNIDine (CATAPRES) 0.2 MG tablet Take 1 tablet (0.2 mg total) by mouth 3 (three) times daily. 270 tablet 0  . lisinopril (PRINIVIL,ZESTRIL) 10 MG tablet Take 10 mg by mouth daily.    [name] ON 11/12/2019] lithium carbonate (ESKALITH) 450 MG CR tablet Take 1 tablet (450 mg total) by mouth at bedtime. Take with 300 mg to equal 750 mg at bedtime 90 tablet 0  . [START ON 11/13/2019] lithium carbonate (LITHOBID) 300 MG CR tablet Take total of 750 mg at night along with 400 mg tab 90 tablet 0  . Multiple Vitamin (MULTIVITAMIN WITH MINERALS) TABS tablet Take 1 tablet by mouth daily.    [name] omeprazole (PRILOSEC) 20 MG capsule Take 20 mg by mouth daily.    [name] ON 12/12/2019] risperidone (RISPERDAL) 4 MG tablet Take 1 tablet (4 mg total) by mouth at bedtime. 90 tablet 0  . sertraline (ZOLOFT) 100 MG tablet Take 1.5 tablets (150 mg total) by mouth daily. 135 tablet 0  . [START ON 11/12/2019] traZODone (DESYREL) 150 MG tablet Take 1 tablet (150 mg total) by mouth at bedtime. 90 tablet 0  . zinc gluconate 50 MG tablet Take 50 mg by mouth daily.     No current facility-administered medications for this visit.    No Known Allergies  Review of Systems  No fever, mild to moderate thin drainage from the wound  BP 125/85   Pulse (!) 110   Resp 20   Ht 5\' 11"  (1.803 m)   Wt 270 lb (122.5 kg)   SpO2 96% Comment: RA  BMI 37.66 kg/m  Physical Exam      Exam    General- alert and comfortable.  Wound over right sternoclavicular joint measures 8 mm in width 3 cm deep directed medially.    Neck- no  JVD, no cervical adenopathy palpable, no carotid bruit   Lungs- clear without rales, wheezes   Cor- regular rate and rhythm, no murmur , gallop   Abdomen- soft, non-tender   Extremities - warm, non-tender, minimal edema   Neuro- oriented, appropriate, no focal weakness   Diagnostic Tests: CT scan images personally reviewed as noted above  Impression: Continued contraction of the sternoclavicular joint wound.  However it has now become very low narrow at the skin level with risk of the wound being sealed off  with out the complete healing of the depth of the wound.  The patient understands importance of daily packing with calcium alginate strips and and cleaning the wound with a Q-tip to prevent the skin from healing over the unhealed deeper wound.  Plan: Return for wound check in 1 week.  Follow-up wound culture.  The patient is not on antibiotics now but if the MSSA reappears he will be called in a new course of antibiotics. [name], MD [facility] [phone]

## 2019-11-10 ENCOUNTER — Other Ambulatory Visit: Payer: Self-pay | Admitting: Cardiothoracic Surgery

## 2019-11-10 LAB — WOUND CULTURE
MICRO NUMBER:: 10982349
SPECIMEN QUALITY:: ADEQUATE

## 2019-11-10 MED ORDER — DOXYCYCLINE HYCLATE 100 MG PO TABS: 100.0000 mg | ORAL_TABLET | Freq: Two times a day (BID) | ORAL | 1 refills | Status: DC

## 2019-11-12 ENCOUNTER — Ambulatory Visit: Payer: Self-pay | Admitting: Cardiothoracic Surgery

## 2019-11-12 ENCOUNTER — Encounter: Payer: Self-pay | Admitting: Cardiothoracic Surgery

## 2019-11-12 ENCOUNTER — Other Ambulatory Visit: Payer: Self-pay

## 2019-11-12 VITALS — BP 144/81 | HR 132 | Temp 97.5°F | Resp 20 | Ht 71.0 in | Wt 274.0 lb

## 2019-11-12 DIAGNOSIS — Z5189 Encounter for other specified aftercare: Secondary | ICD-10-CM

## 2019-11-12 NOTE — Progress Notes (Signed)
PCP is Ignatius Specking, MD Referring Provider is Ignatius Specking, MD  Chief Complaint  Patient presents with  . Wound Check    1 wk f/u of sternoclavicular joint wound    HPI: The patient returns for wound check.  He has a right sternoclavicular joint wound approximately 2.5 cm deep.  He was seen last week and a wound culture returned positive for MSSA.  He was started on oral doxycycline 100 mg p.o. twice daily.  Wound is being packed daily at home with calcium alginate.  The wound is examined.  There is minimal erythema.  I cleaned out the wound with a dry Q-tip, I applied topical silver nitrate, and then applied 1 cc of activated human collagen and packed with a strip of calcium alginate.  Sterile dressing was applied.   Past Medical History:  Diagnosis Date  . Anemia   . Arthritis    rheumatoid  . Asthma   . Bipolar disorder (HCC)   . Depression   . GERD (gastroesophageal reflux disease)   . Hypertension 2019  . Ischemic colitis (HCC) 09/2008  . MSSA (methicillin susceptible Staphylococcus aureus)    sternaoclavicular absecess drainage  . Osteoarthritis    sterum and right shoulder  . PTSD (post-traumatic stress disorder)   . Tachycardia     Past Surgical History:  Procedure Laterality Date  . APPENDECTOMY    . APPLICATION OF WOUND VAC Right 06/20/2019   Procedure: APPLICATION OF WOUND VAC;  Surgeon: [name], MD;  Location: [facility] OR;  Service: Vascular;  Laterality: Right;  . APPLICATION OF WOUND VAC Right 06/23/2019   Procedure: APPLICATION OF WOUND VAC;  Surgeon: [name], MD;  Location: [facility] OR;  Service: Thoracic;  Laterality: Right;  . COLONOSCOPY     x 2  . I & D EXTREMITY Right 05/31/2019   Procedure: IRRIGATION AND DEBRIDEMENT LOWER EXTREMITY;  Surgeon: Tarry Kos, MD;  Location: [facility] OR;  Service: Orthopedics;  Laterality: Right;  . I & D EXTREMITY Right 06/20/2019   Procedure: DEBRIDEMENT OF STERNOCLAVICULAR JOINT ABSCESS;  Surgeon: [name], MD;   Location: [facility] OR;  Service: Vascular;  Laterality: Right;  . STERNAL WOUND DEBRIDEMENT Right 09/19/2019   Procedure: EXCISIONAL DEBRIDEMENT AND IRRIGATION OF RIGHT STERNOCLAVICULAR JOINT WOUND;  Surgeon: [name], MD;  Location: [facility] OR;  Service: Thoracic;  Laterality: Right;  . TEE WITHOUT CARDIOVERSION N/A 06/05/2019   Procedure: TRANSESOPHAGEAL ECHOCARDIOGRAM (TEE);  Surgeon: Lewayne Bunting, MD;  Location: [facility] ENDOSCOPY;  Service: Cardiovascular;  Laterality: N/A;  . WOUND EXPLORATION Right 06/23/2019   Procedure: irrigation and clean out right shoulder;  Surgeon: [name], MD;  Location: [facility] OR;  Service: Thoracic;  Laterality: Right;    Family History  Problem Relation Age of Onset  . Alcohol abuse Mother   . Depression Mother   . Drug abuse Mother   . Physical abuse Mother   . Sexual abuse Mother   . ADD / ADHD Paternal Aunt   . Alcohol abuse Maternal Grandfather   . Bipolar disorder Maternal Grandmother   . Schizophrenia Maternal Grandmother   . Drug abuse Maternal Grandmother   . Alcohol abuse Paternal Grandfather   . Depression Paternal Grandfather   . Depression Paternal Grandmother   . Drug abuse Paternal Grandmother     Social History Social History   Tobacco Use  . Smoking status: Current Every Day Smoker    Packs/day: 0.50    Years: 15.00  Pack years: 7.50    Types: Cigarettes  . Smokeless tobacco: Never Used  Vaping Use  . Vaping Use: Never used  Substance Use Topics  . Alcohol use: No    Comment: rare beer  . Drug use: No    Current Outpatient Medications  Medication Sig Dispense Refill  . atorvastatin (LIPITOR) 10 MG tablet Take 10 mg by mouth at bedtime.   12  . Cholecalciferol (VITAMIN D3) 50 MCG (2000 UT) TABS Take 2,000 mg by mouth daily.    . cloNIDine (CATAPRES) 0.2 MG tablet Take 1 tablet (0.2 mg total) by mouth 3 (three) times daily. 270 tablet 0  . doxycycline (VIBRA-TABS) 100 MG tablet Take 1 tablet (100 mg total) by mouth 2  (two) times daily. 14 tablet 1  . lisinopril (PRINIVIL,ZESTRIL) 10 MG tablet Take 10 mg by mouth daily.    [name] lithium carbonate (ESKALITH) 450 MG CR tablet Take 1 tablet (450 mg total) by mouth at bedtime. Take with 300 mg to equal 750 mg at bedtime 90 tablet 0  . [START ON 11/13/2019] lithium carbonate (LITHOBID) 300 MG CR tablet Take total of 750 mg at night along with 400 mg tab 90 tablet 0  . Multiple Vitamin (MULTIVITAMIN WITH MINERALS) TABS tablet Take 1 tablet by mouth daily.    [name] omeprazole (PRILOSEC) 20 MG capsule Take 20 mg by mouth daily.    [name] ON 12/12/2019] risperidone (RISPERDAL) 4 MG tablet Take 1 tablet (4 mg total) by mouth at bedtime. 90 tablet 0  . sertraline (ZOLOFT) 100 MG tablet Take 1.5 tablets (150 mg total) by mouth daily. 135 tablet 0  . traZODone (DESYREL) 150 MG tablet Take 1 tablet (150 mg total) by mouth at bedtime. 90 tablet 0  . zinc gluconate 50 MG tablet Take 50 mg by mouth daily.     No current facility-administered medications for this visit.    No Known Allergies  Review of Systems  No fever Minimal wound drainage Right lateral shoulder pain and tenderness unchanged  BP (!) 144/81   Pulse (!) 132   Temp (!) 97.5 F (36.4 C)   Resp 20   Ht 5\' 11"  (1.803 m)   Wt 274 lb (124.3 kg)   SpO2 96%   BMI 38.22 kg/m  Physical Exam      Exam    General- alert and comfortable.  Wound appearance as stated above    Neck- no JVD, no cervical adenopathy palpable, no carotid bruit   Lungs- clear without rales, wheezes   Cor- regular rate and rhythm, no murmur , gallop   Abdomen- soft, non-tender   Extremities - warm, non-tender, minimal edema   Neuro- oriented, appropriate, no focal weakness   Diagnostic Tests: None  Impression: Continue twice daily doxycycline for another week Continue daily dressing changes with strips of calcium alginate packing into the wound covered with a 2 x 2 Plan: Return for wound check in 1 week   , MD [facility] [phone]

## 2019-11-19 ENCOUNTER — Other Ambulatory Visit: Payer: Self-pay

## 2019-11-19 ENCOUNTER — Encounter: Payer: Self-pay | Admitting: Cardiothoracic Surgery

## 2019-11-19 ENCOUNTER — Ambulatory Visit (INDEPENDENT_AMBULATORY_CARE_PROVIDER_SITE_OTHER): Payer: Self-pay | Admitting: Cardiothoracic Surgery

## 2019-11-19 ENCOUNTER — Other Ambulatory Visit: Payer: Self-pay | Admitting: *Deleted

## 2019-11-19 VITALS — BP 133/81 | HR 118 | Temp 98.1°F | Resp 18 | Ht 71.0 in | Wt 277.0 lb

## 2019-11-19 DIAGNOSIS — M009 Pyogenic arthritis, unspecified: Secondary | ICD-10-CM

## 2019-11-19 DIAGNOSIS — M25511 Pain in right shoulder: Secondary | ICD-10-CM

## 2019-11-19 DIAGNOSIS — Z4889 Encounter for other specified surgical aftercare: Secondary | ICD-10-CM

## 2019-11-19 MED ORDER — TRAMADOL HCL 50 MG PO TABS: 50.0000 mg | ORAL_TABLET | Freq: Four times a day (QID) | ORAL | 0 refills | Status: DC | PRN

## 2019-11-19 MED ORDER — LEVOFLOXACIN 750 MG PO TABS: 750.0000 mg | ORAL_TABLET | Freq: Every day | ORAL | 0 refills | Status: DC

## 2019-11-19 NOTE — Progress Notes (Signed)
Wound culture sent to [facility].

## 2019-11-19 NOTE — Progress Notes (Signed)
PCP is Ignatius Specking, MD Referring Provider is Ignatius Specking, MD  Chief Complaint  Patient presents with  . Wound Check    septic arthritis right sternoclavicular joint    HPI: Patient returns for a wound check and dressing change of the right sternoclavicular joint. Wound culture last week demonstrated MSSA and he was started on Keflex. The patient has continued mucoid drainage. The patient's family is packing the wound once a day. Is approximately 2.5 cm deep and 0.5 cm wide. I cleaned it out with a dry gauze and sharply debrided some granulation tissue at the skin opening. The calcium alginate packing is not improving the situation so we will switch to Aquacel silver packing and narrow strips which I cut and provided the patient. Will add a course of oral Levaquin to supplement the doxycycline and encouraged the patient's family to pack the Aquacel to the depth of the wound.  Past Medical History:  Diagnosis Date  . Anemia   . Arthritis    rheumatoid  . Asthma   . Bipolar disorder (HCC)   . Depression   . GERD (gastroesophageal reflux disease)   . Hypertension 2019  . Ischemic colitis (HCC) 09/2008  . MSSA (methicillin susceptible Staphylococcus aureus)    sternaoclavicular absecess drainage  . Osteoarthritis    sterum and right shoulder  . PTSD (post-traumatic stress disorder)   . Tachycardia     Past Surgical History:  Procedure Laterality Date  . APPENDECTOMY    . APPLICATION OF WOUND VAC Right 06/20/2019   Procedure: APPLICATION OF WOUND VAC;  Surgeon: [name], MD;  Location: [facility] OR;  Service: Vascular;  Laterality: Right;  . APPLICATION OF WOUND VAC Right 06/23/2019   Procedure: APPLICATION OF WOUND VAC;  Surgeon: [name], MD;  Location: [facility] OR;  Service: Thoracic;  Laterality: Right;  . COLONOSCOPY     x 2  . I & D EXTREMITY Right 05/31/2019   Procedure: IRRIGATION AND DEBRIDEMENT LOWER EXTREMITY;  Surgeon: Tarry Kos, MD;  Location: [facility] OR;  Service:  Orthopedics;  Laterality: Right;  . I & D EXTREMITY Right 06/20/2019   Procedure: DEBRIDEMENT OF STERNOCLAVICULAR JOINT ABSCESS;  Surgeon: [name], MD;  Location: [facility] OR;  Service: Vascular;  Laterality: Right;  . STERNAL WOUND DEBRIDEMENT Right 09/19/2019   Procedure: EXCISIONAL DEBRIDEMENT AND IRRIGATION OF RIGHT STERNOCLAVICULAR JOINT WOUND;  Surgeon: [name], MD;  Location: [facility] OR;  Service: Thoracic;  Laterality: Right;  . TEE WITHOUT CARDIOVERSION N/A 06/05/2019   Procedure: TRANSESOPHAGEAL ECHOCARDIOGRAM (TEE);  Surgeon: Lewayne Bunting, MD;  Location: [facility] ENDOSCOPY;  Service: Cardiovascular;  Laterality: N/A;  . WOUND EXPLORATION Right 06/23/2019   Procedure: irrigation and clean out right shoulder;  Surgeon: [name], MD;  Location: [facility] OR;  Service: Thoracic;  Laterality: Right;    Family History  Problem Relation Age of Onset  . Alcohol abuse Mother   . Depression Mother   . Drug abuse Mother   . Physical abuse Mother   . Sexual abuse Mother   . ADD / ADHD Paternal Aunt   . Alcohol abuse Maternal Grandfather   . Bipolar disorder Maternal Grandmother   . Schizophrenia Maternal Grandmother   . Drug abuse Maternal Grandmother   . Alcohol abuse Paternal Grandfather   . Depression Paternal Grandfather   . Depression Paternal Grandmother   . Drug abuse Paternal Grandmother     Social History Social History   Tobacco Use  .  Smoking status: Current Every Day Smoker    Packs/day: 0.50    Years: 15.00    Pack years: 7.50    Types: Cigarettes  . Smokeless tobacco: Never Used  Vaping Use  . Vaping Use: Never used  Substance Use Topics  . Alcohol use: No    Comment: rare beer  . Drug use: No    Current Outpatient Medications  Medication Sig Dispense Refill  . atorvastatin (LIPITOR) 10 MG tablet Take 10 mg by mouth at bedtime.   12  . Cholecalciferol (VITAMIN D3) 50 MCG (2000 UT) TABS Take 2,000 mg by mouth daily.    . cloNIDine (CATAPRES) 0.2 MG  tablet Take 1 tablet (0.2 mg total) by mouth 3 (three) times daily. 270 tablet 0  . doxycycline (VIBRA-TABS) 100 MG tablet Take 1 tablet (100 mg total) by mouth 2 (two) times daily. 14 tablet 1  . lisinopril (PRINIVIL,ZESTRIL) 10 MG tablet Take 10 mg by mouth daily.    [name] lithium carbonate (ESKALITH) 450 MG CR tablet Take 1 tablet (450 mg total) by mouth at bedtime. Take with 300 mg to equal 750 mg at bedtime 90 tablet 0  . lithium carbonate (LITHOBID) 300 MG CR tablet Take total of 750 mg at night along with 400 mg tab 90 tablet 0  . Multiple Vitamin (MULTIVITAMIN WITH MINERALS) TABS tablet Take 1 tablet by mouth daily.    [name] omeprazole (PRILOSEC) 20 MG capsule Take 20 mg by mouth daily.    [name] ON 12/12/2019] risperidone (RISPERDAL) 4 MG tablet Take 1 tablet (4 mg total) by mouth at bedtime. 90 tablet 0  . sertraline (ZOLOFT) 100 MG tablet Take 1.5 tablets (150 mg total) by mouth daily. 135 tablet 0  . traZODone (DESYREL) 150 MG tablet Take 1 tablet (150 mg total) by mouth at bedtime. 90 tablet 0  . zinc gluconate 50 MG tablet Take 50 mg by mouth daily.    [name] levofloxacin (LEVAQUIN) 750 MG tablet Take 1 tablet (750 mg total) by mouth daily for 10 days. 10 tablet 0  . traMADol (ULTRAM) 50 MG tablet Take 1 tablet (50 mg total) by mouth every 6 (six) hours as needed. 40 tablet 0   No current facility-administered medications for this visit.    No Known Allergies  Review of Systems  No fever Compliant with medication No shortness of breath No bleeding from the wound  BP 133/81 (BP Location: Left Arm, Patient Position: Sitting)   Pulse (!) 118   Temp 98.1 F (36.7 C)   Resp 18   Ht 5\' 11"  (1.803 m)   Wt 277 lb (125.6 kg)   SpO2 95% Comment: RA  BMI 38.63 kg/m  Physical Exam      Exam    General- alert and comfortable    Neck- no JVD, no cervical adenopathy palpable, no carotid bruit Sternoclavicular wound-I personally cleaned and packed the wound with Aquacel strip after  applying silver nitrate topically and covered with sterile dressing.   Lungs- clear without rales, wheezes   Cor- regular rate and rhythm, no murmur , gallop   Abdomen- soft, non-tender   Extremities - warm, non-tender, minimal edema   Neuro- oriented, appropriate, no focal weakness   Diagnostic Tests: Wound culture- MSSA, sensitive to Levaquin so we will add this for double coverage for 1 week.  Impression: Persistent draining sinus tract from right sternoclavicular joint despite 2 surgical debridement procedures and most recent CT scan showing no evidence of osteomyelitis. Persistent  positive MSSA culture from wound Plan: Pack wound with Aquacel silver strip and covered with double antibiotic therapy for 1 week. Return for wound check and dressing change in 1 week.   [name], MD [facility] [phone]

## 2019-11-22 LAB — WOUND CULTURE
MICRO NUMBER:: 11039014
SPECIMEN QUALITY:: ADEQUATE
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
## 2019-11-26 ENCOUNTER — Encounter: Payer: Self-pay | Admitting: Cardiothoracic Surgery

## 2019-11-26 ENCOUNTER — Ambulatory Visit: Payer: Self-pay | Admitting: Cardiothoracic Surgery

## 2019-11-26 ENCOUNTER — Ambulatory Visit (INDEPENDENT_AMBULATORY_CARE_PROVIDER_SITE_OTHER): Payer: Self-pay | Admitting: Cardiothoracic Surgery

## 2019-11-26 ENCOUNTER — Other Ambulatory Visit: Payer: Self-pay

## 2019-11-26 VITALS — BP 101/71 | HR 134 | Temp 97.7°F | Resp 18 | Ht 71.0 in | Wt 273.2 lb

## 2019-11-26 DIAGNOSIS — M009 Pyogenic arthritis, unspecified: Secondary | ICD-10-CM

## 2019-11-26 MED ORDER — DOXYCYCLINE HYCLATE 100 MG PO TABS: 100.0000 mg | ORAL_TABLET | Freq: Two times a day (BID) | ORAL | 1 refills | Status: DC

## 2019-11-26 MED ORDER — TRAMADOL HCL 50 MG PO TABS: 50.0000 mg | ORAL_TABLET | Freq: Two times a day (BID) | ORAL | 0 refills | Status: AC | PRN

## 2019-11-26 NOTE — Progress Notes (Signed)
PCP is Ignatius Specking, MD Referring Provider is Ignatius Specking, MD  Chief Complaint  Patient presents with  . Wound Check    HPI: The patient returns for a dressing change and wound check.  Last week a wound culture returned skin contaminant streptococcal species.  He is taking twice daily doxycycline 100 mg for MSSA he is packing the wound daily with silver alginate.  It has had minimal drainage.  Patient requests more tramadol.  We will reduce the dosing to twice a day to try to wean off.  Past Medical History:  Diagnosis Date  . Anemia   . Arthritis    rheumatoid  . Asthma   . Bipolar disorder (HCC)   . Depression   . GERD (gastroesophageal reflux disease)   . Hypertension 2019  . Ischemic colitis (HCC) 09/2008  . MSSA (methicillin susceptible Staphylococcus aureus)    sternaoclavicular absecess drainage  . Osteoarthritis    sterum and right shoulder  . PTSD (post-traumatic stress disorder)   . Tachycardia     Past Surgical History:  Procedure Laterality Date  . APPENDECTOMY    . APPLICATION OF WOUND VAC Right 06/20/2019   Procedure: APPLICATION OF WOUND VAC;  Surgeon: [name], MD;  Location: [facility] OR;  Service: Vascular;  Laterality: Right;  . APPLICATION OF WOUND VAC Right 06/23/2019   Procedure: APPLICATION OF WOUND VAC;  Surgeon: [name], MD;  Location: [facility] OR;  Service: Thoracic;  Laterality: Right;  . COLONOSCOPY     x 2  . I & D EXTREMITY Right 05/31/2019   Procedure: IRRIGATION AND DEBRIDEMENT LOWER EXTREMITY;  Surgeon: Tarry Kos, MD;  Location: [facility] OR;  Service: Orthopedics;  Laterality: Right;  . I & D EXTREMITY Right 06/20/2019   Procedure: DEBRIDEMENT OF STERNOCLAVICULAR JOINT ABSCESS;  Surgeon: [name], MD;  Location: [facility] OR;  Service: Vascular;  Laterality: Right;  . STERNAL WOUND DEBRIDEMENT Right 09/19/2019   Procedure: EXCISIONAL DEBRIDEMENT AND IRRIGATION OF RIGHT STERNOCLAVICULAR JOINT WOUND;  Surgeon: [name], MD;  Location:  [facility] OR;  Service: Thoracic;  Laterality: Right;  . TEE WITHOUT CARDIOVERSION N/A 06/05/2019   Procedure: TRANSESOPHAGEAL ECHOCARDIOGRAM (TEE);  Surgeon: Lewayne Bunting, MD;  Location: [facility] ENDOSCOPY;  Service: Cardiovascular;  Laterality: N/A;  . WOUND EXPLORATION Right 06/23/2019   Procedure: irrigation and clean out right shoulder;  Surgeon: [name], MD;  Location: [facility] OR;  Service: Thoracic;  Laterality: Right;    Family History  Problem Relation Age of Onset  . Alcohol abuse Mother   . Depression Mother   . Drug abuse Mother   . Physical abuse Mother   . Sexual abuse Mother   . ADD / ADHD Paternal Aunt   . Alcohol abuse Maternal Grandfather   . Bipolar disorder Maternal Grandmother   . Schizophrenia Maternal Grandmother   . Drug abuse Maternal Grandmother   . Alcohol abuse Paternal Grandfather   . Depression Paternal Grandfather   . Depression Paternal Grandmother   . Drug abuse Paternal Grandmother     Social History Social History   Tobacco Use  . Smoking status: Current Every Day Smoker    Packs/day: 0.50    Years: 15.00    Pack years: 7.50    Types: Cigarettes  . Smokeless tobacco: Never Used  Vaping Use  . Vaping Use: Never used  Substance Use Topics  . Alcohol use: No    Comment: rare beer  . Drug use: No  Current Outpatient Medications  Medication Sig Dispense Refill  . atorvastatin (LIPITOR) 10 MG tablet Take 10 mg by mouth at bedtime.   12  . Cholecalciferol (VITAMIN D3) 50 MCG (2000 UT) TABS Take 2,000 mg by mouth daily.    . cloNIDine (CATAPRES) 0.2 MG tablet Take 1 tablet (0.2 mg total) by mouth 3 (three) times daily. 270 tablet 0  . doxycycline (VIBRA-TABS) 100 MG tablet Take 1 tablet (100 mg total) by mouth 2 (two) times daily. 14 tablet 1  . lisinopril (PRINIVIL,ZESTRIL) 10 MG tablet Take 10 mg by mouth daily.    [name] lithium carbonate (ESKALITH) 450 MG CR tablet Take 1 tablet (450 mg total) by mouth at bedtime. Take with 300 mg to equal 750  mg at bedtime 90 tablet 0  . lithium carbonate (LITHOBID) 300 MG CR tablet Take total of 750 mg at night along with 400 mg tab 90 tablet 0  . Multiple Vitamin (MULTIVITAMIN WITH MINERALS) TABS tablet Take 1 tablet by mouth daily.    [name] omeprazole (PRILOSEC) 20 MG capsule Take 20 mg by mouth daily.    [name] ON 12/12/2019] risperidone (RISPERDAL) 4 MG tablet Take 1 tablet (4 mg total) by mouth at bedtime. 90 tablet 0  . sertraline (ZOLOFT) 100 MG tablet Take 1.5 tablets (150 mg total) by mouth daily. 135 tablet 0  . traMADol (ULTRAM) 50 MG tablet Take 1 tablet (50 mg total) by mouth every 12 (twelve) hours as needed for up to 20 days. 40 tablet 0  . traZODone (DESYREL) 150 MG tablet Take 1 tablet (150 mg total) by mouth at bedtime. 90 tablet 0  . zinc gluconate 50 MG tablet Take 50 mg by mouth daily.     No current facility-administered medications for this visit.    No Known Allergies  Review of Systems   No fever Minimal pain  BP 101/71 (BP Location: Left Arm, Patient Position: Sitting)   Pulse (!) 134   Temp 97.7 F (36.5 C)   Resp 18   Ht 5\' 11"  (1.803 m)   Wt 273 lb 3.2 oz (123.9 kg)   SpO2 94% Comment: RA with mask on  BMI 38.10 kg/m  Physical Exam      Exam    General- alert and comfortable Chest wall-I personally examined the wound, expanded the skin entry site so it would not close off and cleaned it with peroxide and saline and then packed with silver alginate strip.  He remains approximately 2.5 cm deep and less than a centimeter wide    Neck- no JVD, no cervical adenopathy palpable, no carotid bruit   Lungs- clear without rales, wheezes   Cor- regular rate and rhythm, no murmur , gallop   Abdomen- soft, non-tender   Extremities - warm, non-tender, minimal edema   Neuro- oriented, appropriate, no focal weakness  Diagnostic Tests: None  Impression: Slow to heal sinus tract in a previous incision to sharply debride the right sternoclavicular space from a MSSA  infection.  Continue oral antibiotic and daily dressing changes and weekly office visits for wound care and assessment  Plan: Return in 1 week.  Continue doxycycline 100 mg twice daily, continue tramadol 50 mg p.o. twice daily as needed   , MD [facility] [phone]

## 2019-11-27 ENCOUNTER — Telehealth (INDEPENDENT_AMBULATORY_CARE_PROVIDER_SITE_OTHER): Payer: BC Managed Care – PPO | Admitting: Psychiatry

## 2019-11-27 ENCOUNTER — Encounter (HOSPITAL_COMMUNITY): Payer: Self-pay | Admitting: Psychiatry

## 2019-11-27 DIAGNOSIS — F6381 Intermittent explosive disorder: Secondary | ICD-10-CM | POA: Diagnosis not present

## 2019-11-27 DIAGNOSIS — F4310 Post-traumatic stress disorder, unspecified: Secondary | ICD-10-CM | POA: Diagnosis not present

## 2019-11-27 DIAGNOSIS — F330 Major depressive disorder, recurrent, mild: Secondary | ICD-10-CM | POA: Diagnosis not present

## 2019-11-27 NOTE — Patient Instructions (Signed)
1.Continue sertraline 150 mg daily 2.Continuerisperidone4mg  at night 3.Continuelithium750mg  at night 4.Continueclonidine 0.2 mg three times a day 6.ContinueTrazodone150mg  daily  7.Next appointment:12/9 at [facility]

## 2019-12-03 ENCOUNTER — Ambulatory Visit: Payer: BC Managed Care – PPO | Admitting: Cardiothoracic Surgery

## 2019-12-10 ENCOUNTER — Ambulatory Visit: Payer: Self-pay | Admitting: Cardiothoracic Surgery

## 2019-12-17 ENCOUNTER — Other Ambulatory Visit: Payer: Self-pay

## 2019-12-17 ENCOUNTER — Ambulatory Visit (INDEPENDENT_AMBULATORY_CARE_PROVIDER_SITE_OTHER): Payer: Self-pay | Admitting: Cardiothoracic Surgery

## 2019-12-17 ENCOUNTER — Encounter: Payer: Self-pay | Admitting: Cardiothoracic Surgery

## 2019-12-17 VITALS — BP 111/77 | HR 114 | Temp 97.9°F | Resp 18 | Ht 71.0 in | Wt 273.6 lb

## 2019-12-17 DIAGNOSIS — M009 Pyogenic arthritis, unspecified: Secondary | ICD-10-CM

## 2019-12-17 DIAGNOSIS — T8189XA Other complications of procedures, not elsewhere classified, initial encounter: Secondary | ICD-10-CM | POA: Insufficient documentation

## 2019-12-17 DIAGNOSIS — T8189XD Other complications of procedures, not elsewhere classified, subsequent encounter: Secondary | ICD-10-CM

## 2019-12-17 MED ORDER — TRAMADOL HCL 50 MG PO TABS: 50.0000 mg | ORAL_TABLET | Freq: Four times a day (QID) | ORAL | 0 refills | Status: DC | PRN

## 2019-12-17 MED ORDER — DOXYCYCLINE HYCLATE 100 MG PO TABS: 100.0000 mg | ORAL_TABLET | Freq: Two times a day (BID) | ORAL | 3 refills | Status: DC

## 2019-12-17 NOTE — Progress Notes (Signed)
PCP is Ignatius Specking, MD Referring Provider is Ignatius Specking, MD  Chief Complaint  Patient presents with  . Wound Check    HPI: The patient returns for follow-up of his right sternoclavicular joint wound.  He has missed 2 visits and it is almost 3 weeks since his last visit.  He is on oral doxycycline 100 twice daily  Since his last visit there has been some soft tissue overgrowth at the skin site which has impeded adequate packing of the wound with silver alginate strips.  The wound tunnels approximately 3 to 4 cm inferiorly and medially.  I personally change the wound today cleaned it with silver nitrate and placed a silver alginate strip covered with dry gauze and Tegaderm  Had a long discussion with the patient the importance of keeping his appointments to allow adequate wound recovery.  Because of the lack of progress over the past several weeks the patient will now come to the office twice a week to maximize our efforts to heal this wound.  Last CT scan showed no evidence of deep abscess or osteomyelitis.  He will continue the doxycycline  100 twice daily Past Medical History:  Diagnosis Date  . Anemia   . Arthritis    rheumatoid  . Asthma   . Bipolar disorder (HCC)   . Depression   . GERD (gastroesophageal reflux disease)   . Hypertension 2019  . Ischemic colitis (HCC) 09/2008  . MSSA (methicillin susceptible Staphylococcus aureus)    sternaoclavicular absecess drainage  . Osteoarthritis    sterum and right shoulder  . PTSD (post-traumatic stress disorder)   . Tachycardia     Past Surgical History:  Procedure Laterality Date  . APPENDECTOMY    . APPLICATION OF WOUND VAC Right 06/20/2019   Procedure: APPLICATION OF WOUND VAC;  Surgeon: [name], MD;  Location: [facility] OR;  Service: Vascular;  Laterality: Right;  . APPLICATION OF WOUND VAC Right 06/23/2019   Procedure: APPLICATION OF WOUND VAC;  Surgeon: [name], MD;  Location: [facility] OR;  Service: Thoracic;   Laterality: Right;  . COLONOSCOPY     x 2  . I & D EXTREMITY Right 05/31/2019   Procedure: IRRIGATION AND DEBRIDEMENT LOWER EXTREMITY;  Surgeon: Tarry Kos, MD;  Location: [facility] OR;  Service: Orthopedics;  Laterality: Right;  . I & D EXTREMITY Right 06/20/2019   Procedure: DEBRIDEMENT OF STERNOCLAVICULAR JOINT ABSCESS;  Surgeon: [name], MD;  Location[facility] OR;  Service: Vascular;  Laterality: Right;  . STERNAL WOUND DEBRIDEMENT Right 09/19/2019   Procedure: EXCISIONAL DEBRIDEMENT AND IRRIGATION OF RIGHT STERNOCLAVICULAR JOINT WOUND;  Surgeon: [name], MD;  Location: [facility] OR;  Service: Thoracic;  Laterality: Right;  . TEE WITHOUT CARDIOVERSION N/A 06/05/2019   Procedure: TRANSESOPHAGEAL ECHOCARDIOGRAM (TEE);  Surgeon: Lewayne Bunting, MD;  Location: [facility] ENDOSCOPY;  Service: Cardiovascular;  Laterality: N/A;  . WOUND EXPLORATION Right 06/23/2019   Procedure: irrigation and clean out right shoulder;  Surgeon: [name], MD;  Location: [facility] OR;  Service: Thoracic;  Laterality: Right;    Family History  Problem Relation Age of Onset  . Alcohol abuse Mother   . Depression Mother   . Drug abuse Mother   . Physical abuse Mother   . Sexual abuse Mother   . ADD / ADHD Paternal Aunt   . Alcohol abuse Maternal Grandfather   . Bipolar disorder Maternal Grandmother   . Schizophrenia Maternal Grandmother   . Drug abuse  Maternal Grandmother   . Alcohol abuse Paternal Grandfather   . Depression Paternal Grandfather   . Depression Paternal Grandmother   . Drug abuse Paternal Grandmother     Social History Social History   Tobacco Use  . Smoking status: Current Every Day Smoker    Packs/day: 0.50    Years: 15.00    Pack years: 7.50    Types: Cigarettes  . Smokeless tobacco: Never Used  Vaping Use  . Vaping Use: Never used  Substance Use Topics  . Alcohol use: No    Comment: rare beer  . Drug use: No    Current Outpatient Medications  Medication Sig Dispense Refill  .  atorvastatin (LIPITOR) 10 MG tablet Take 10 mg by mouth at bedtime.   12  . Cholecalciferol (VITAMIN D3) 50 MCG (2000 UT) TABS Take 2,000 mg by mouth daily.    . cloNIDine (CATAPRES) 0.2 MG tablet Take 1 tablet (0.2 mg total) by mouth 3 (three) times daily. 270 tablet 0  . doxycycline (VIBRA-TABS) 100 MG tablet Take 1 tablet (100 mg total) by mouth 2 (two) times daily. 14 tablet 1  . lisinopril (PRINIVIL,ZESTRIL) 10 MG tablet Take 10 mg by mouth daily.    [name] lithium carbonate (ESKALITH) 450 MG CR tablet Take 1 tablet (450 mg total) by mouth at bedtime. Take with 300 mg to equal 750 mg at bedtime 90 tablet 0  . lithium carbonate (LITHOBID) 300 MG CR tablet Take total of 750 mg at night along with 400 mg tab 90 tablet 0  . metroNIDAZOLE (FLAGYL) 500 MG tablet Take 500 mg by mouth 3 (three) times daily.    . Multiple Vitamin (MULTIVITAMIN WITH MINERALS) TABS tablet Take 1 tablet by mouth daily.    [name] omeprazole (PRILOSEC) 20 MG capsule Take 20 mg by mouth daily.    . risperidone (RISPERDAL) 4 MG tablet Take 1 tablet (4 mg total) by mouth at bedtime. 90 tablet 0  . sertraline (ZOLOFT) 100 MG tablet Take 1.5 tablets (150 mg total) by mouth daily. 135 tablet 0  . traZODone (DESYREL) 150 MG tablet Take 1 tablet (150 mg total) by mouth at bedtime. 90 tablet 0  . zinc gluconate 50 MG tablet Take 50 mg by mouth daily.     No current facility-administered medications for this visit.    No Known Allergies  Review of Systems   No fever Soreness in the region of the original incision and the right shoulder Taking zinc tablets daily to help with collagen formation  BP 111/77 (BP Location: Left Arm, Patient Position: Sitting)   Pulse (!) 114   Temp 97.9 F (36.6 C)   Resp 18   Ht 5\' 11"  (1.803 m)   Wt 273 lb 9.6 oz (124.1 kg)   SpO2 94%   BMI 38.16 kg/m  Physical Exam      Exam    General- alert and comfortable.  I sharply debrided the wound today of some fibrous scarred fat tissue to allow  access to the deeper part of the tunneled wound.  No gross purulence.    Neck- no JVD, no cervical adenopathy palpable, no carotid bruit   Lungs- clear without rales, wheezes   Cor- regular rate and rhythm, no murmur , gallop   Abdomen- soft, non-tender   Extremities - warm, non-tender, minimal edema   Neuro- oriented, appropriate, no focal weakness   Diagnostic Tests: None  Impression: Nonhealing wound right sternoclavicular joint space.  Manage with daily dressing  changes at home, weekly visits to the office for wound packing and oral antibiotics with doxycycline  Plan: Increase frequency of office visits to about twice a week.  Otherwise continue current wound care.   Herbert Bussing, MD [facility] [phone]

## 2019-12-22 ENCOUNTER — Other Ambulatory Visit: Payer: Self-pay

## 2019-12-22 ENCOUNTER — Ambulatory Visit (INDEPENDENT_AMBULATORY_CARE_PROVIDER_SITE_OTHER): Payer: Self-pay

## 2019-12-22 DIAGNOSIS — Z4801 Encounter for change or removal of surgical wound dressing: Secondary | ICD-10-CM

## 2019-12-22 DIAGNOSIS — Z5189 Encounter for other specified aftercare: Secondary | ICD-10-CM

## 2019-12-22 NOTE — Progress Notes (Signed)
Patient arrived for nurse visit for packing/ dressing change.  He is s/p Excisional Debridement and Irrigation with Dr. [name] 09/19/19 with prior multiple procedure for Right Sternoclavicular joint infection and poor wound healing.  Removed dressing from patient's right clavical region.  No packing in place as patient stated that he "just changed the dressing at [facility] and did not add any packing to the wound".   Patient tolerated procedure well.  Opening to wound very narrow, applied silver nitrate for wound opening to enlarge enough to pack.  Wound tracks posteriorly 4 cm from opening.  Packed wound with silver alginate strip and covered with 2 x 2, 4 x 4, and Tegaderm.  Patient tolerated procedure well.  Patient aware of follow-up appointment with Dr. [name] Wednesday.

## 2019-12-23 ENCOUNTER — Other Ambulatory Visit: Payer: Self-pay | Admitting: Psychiatry

## 2019-12-23 ENCOUNTER — Telehealth: Payer: Self-pay

## 2019-12-23 MED ORDER — SERTRALINE HCL 100 MG PO TABS: 150.0000 mg | ORAL_TABLET | Freq: Every day | ORAL | 0 refills | Status: DC

## 2019-12-23 NOTE — Telephone Encounter (Signed)
ordered

## 2019-12-23 NOTE — Telephone Encounter (Signed)
received a fax requesting a refill on the sertraline 100mg     sertraline (ZOLOFT) 100 MG tablet Medication Date: 09/25/2019 Department: [facility] Ordering/Authorizing: [facility], MD  Order Providers  Prescribing Provider Encounter Provider  Neysa Hotter, MD Neysa Hotter, MD  Outpatient Medication Detail   Disp Refills Start End   sertraline (ZOLOFT) 100 MG tablet 135 tablet 0 10/03/2019    Sig - Route: Take 1.5 tablets (150 mg total) by mouth daily. - Oral   Sent to pharmacy as: sertraline (ZOLOFT) 100 MG tablet   E-Prescribing Status: Receipt confirmed by pharmacy (09/25/2019 2:03 PM EDT)   Pharmacy  [id number] - [place], [place] - [address]

## 2019-12-23 NOTE — Telephone Encounter (Signed)
Yes it is ok

## 2019-12-23 NOTE — Telephone Encounter (Signed)
message was left that the lithium 450mg  is a different manufature then pt been and they need a confimrmation that it is ok to switch.

## 2019-12-24 ENCOUNTER — Encounter: Payer: Self-pay | Admitting: Cardiothoracic Surgery

## 2019-12-24 ENCOUNTER — Other Ambulatory Visit: Payer: Self-pay

## 2019-12-24 ENCOUNTER — Ambulatory Visit (INDEPENDENT_AMBULATORY_CARE_PROVIDER_SITE_OTHER): Payer: BC Managed Care – PPO | Admitting: Cardiothoracic Surgery

## 2019-12-24 VITALS — BP 122/77 | HR 120 | Temp 98.1°F | Resp 20 | Ht 71.0 in | Wt 272.0 lb

## 2019-12-24 DIAGNOSIS — Z5189 Encounter for other specified aftercare: Secondary | ICD-10-CM

## 2019-12-24 DIAGNOSIS — M009 Pyogenic arthritis, unspecified: Secondary | ICD-10-CM | POA: Diagnosis not present

## 2019-12-24 NOTE — Progress Notes (Signed)
PCP is Ignatius Specking, MD Referring Provider is Ignatius Specking, MD  Chief Complaint  Patient presents with  . Wound Check    dressing change for right sternovlavicular joint    HPI: Patient returns for wound check of a right sternoclavicular joint space infection.  This was surgically debrided for the second time in August 2021. It has been treated with home packing and weekly office visits.  The culture has grown MSSA in the past as well as a strep skin contaminant.  The wound is tunneled posteriorly and medially.  It has not been packed to the depth of the wound consistently at home.  We have used saline wet-to-dry as well as silver alginate strips and calcium alginate strips with minimal improvement.  I have injected activated human collagen without significant improvement.  Today the wound is examined and the superficial opening is starting to close off.  I opened this mechanically and cleaned the tract with silver nitrate and dry gauze.  There is minimal drainage of the wound today and the depth of the tunnel was only about 2 cm.  I packed it with saline 2 x 2 gauze after injecting a small amount of activated collagen because as the silver strips are cut narrow in order to enter the tunnel they are at risk of fragmenting and leaving foreign material.  The patient is very frustrated with the slow progress and wishes a second opinion.  The patient will be referred to the wound clinic run by Dr. Ulice Bold The patient will continue his oral zinc, abstain from smoking, optimize his diet and do daily packing with 2 x 2 wet-to-dry gauze.  He will continue to come twice a week in order to optimize wound care, at least until he is evaluated by Dr. Hester Mates. Past Medical History:  Diagnosis Date  . Anemia   . Arthritis    rheumatoid  . Asthma   . Bipolar disorder (HCC)   . Depression   . GERD (gastroesophageal reflux disease)   . Hypertension 2019  . Ischemic colitis (HCC) 09/2008  . MSSA  (methicillin susceptible Staphylococcus aureus)    sternaoclavicular absecess drainage  . Osteoarthritis    sterum and right shoulder  . PTSD (post-traumatic stress disorder)   . Tachycardia     Past Surgical History:  Procedure Laterality Date  . APPENDECTOMY    . APPLICATION OF WOUND VAC Right 06/20/2019   Procedure: APPLICATION OF WOUND VAC;  Surgeon: [name], MD;  Location: [facility] OR;  Service: Vascular;  Laterality: Right;  . APPLICATION OF WOUND VAC Right 06/23/2019   Procedure: APPLICATION OF WOUND VAC;  Surgeon: [name], MD;  Location: [facility] OR;  Service: Thoracic;  Laterality: Right;  . COLONOSCOPY     x 2  . I & D EXTREMITY Right 05/31/2019   Procedure: IRRIGATION AND DEBRIDEMENT LOWER EXTREMITY;  Surgeon: Tarry Kos, MD;  Location: [facility] OR;  Service: Orthopedics;  Laterality: Right;  . I & D EXTREMITY Right 06/20/2019   Procedure: DEBRIDEMENT OF STERNOCLAVICULAR JOINT ABSCESS;  Surgeon: [name], MD;  Location: [facility] OR;  Service: Vascular;  Laterality: Right;  . STERNAL WOUND DEBRIDEMENT Right 09/19/2019   Procedure: EXCISIONAL DEBRIDEMENT AND IRRIGATION OF RIGHT STERNOCLAVICULAR JOINT WOUND;  Surgeon: [name], MD;  Location: [facility] OR;  Service: Thoracic;  Laterality: Right;  . TEE WITHOUT CARDIOVERSION N/A 06/05/2019   Procedure: TRANSESOPHAGEAL ECHOCARDIOGRAM (TEE);  Surgeon: Lewayne Bunting, MD;  Location: [facility] ENDOSCOPY;  Service: Cardiovascular;  Laterality: N/A;  . WOUND EXPLORATION Right 06/23/2019   Procedure: irrigation and clean out right shoulder;  Surgeon: [name], MD;  Location: [facility] OR;  Service: Thoracic;  Laterality: Right;    Family History  Problem Relation Age of Onset  . Alcohol abuse Mother   . Depression Mother   . Drug abuse Mother   . Physical abuse Mother   . Sexual abuse Mother   . ADD / ADHD Paternal Aunt   . Alcohol abuse Maternal Grandfather   . Bipolar disorder Maternal Grandmother   . Schizophrenia Maternal  Grandmother   . Drug abuse Maternal Grandmother   . Alcohol abuse Paternal Grandfather   . Depression Paternal Grandfather   . Depression Paternal Grandmother   . Drug abuse Paternal Grandmother     Social History Social History   Tobacco Use  . Smoking status: Current Every Day Smoker    Packs/day: 0.50    Years: 15.00    Pack years: 7.50    Types: Cigarettes  . Smokeless tobacco: Never Used  Vaping Use  . Vaping Use: Never used  Substance Use Topics  . Alcohol use: No    Comment: rare beer  . Drug use: No    Current Outpatient Medications  Medication Sig Dispense Refill  . atorvastatin (LIPITOR) 10 MG tablet Take 10 mg by mouth at bedtime.   12  . Cholecalciferol (VITAMIN D3) 50 MCG (2000 UT) TABS Take 2,000 mg by mouth daily.    . cloNIDine (CATAPRES) 0.2 MG tablet Take 1 tablet (0.2 mg total) by mouth 3 (three) times daily. 270 tablet 0  . doxycycline (VIBRA-TABS) 100 MG tablet Take 1 tablet (100 mg total) by mouth 2 (two) times daily. 14 tablet 3  . lisinopril (PRINIVIL,ZESTRIL) 10 MG tablet Take 10 mg by mouth daily.    [name] lithium carbonate (ESKALITH) 450 MG CR tablet Take 1 tablet (450 mg total) by mouth at bedtime. Take with 300 mg to equal 750 mg at bedtime 90 tablet 0  . lithium carbonate (LITHOBID) 300 MG CR tablet Take total of 750 mg at night along with 400 mg tab 90 tablet 0  . metroNIDAZOLE (FLAGYL) 500 MG tablet Take 500 mg by mouth 3 (three) times daily.    . Multiple Vitamin (MULTIVITAMIN WITH MINERALS) TABS tablet Take 1 tablet by mouth daily.    [name] omeprazole (PRILOSEC) 20 MG capsule Take 20 mg by mouth daily.    . risperidone (RISPERDAL) 4 MG tablet Take 1 tablet (4 mg total) by mouth at bedtime. 90 tablet 0  . [START ON 01/03/2020] sertraline (ZOLOFT) 100 MG tablet Take 1.5 tablets (150 mg total) by mouth daily. 135 tablet 0  . traMADol (ULTRAM) 50 MG tablet Take 1 tablet (50 mg total) by mouth every 6 (six) hours as needed. 40 tablet 0  . traZODone  (DESYREL) 150 MG tablet Take 1 tablet (150 mg total) by mouth at bedtime. 90 tablet 0  . zinc gluconate 50 MG tablet Take 50 mg by mouth daily.     No current facility-administered medications for this visit.    No Known Allergies  Review of Systems  No fever Chronic discomfort in the right shoulder unchanged  BP 122/77   Pulse (!) 120   Temp 98.1 F (36.7 C) (Skin)   Resp 20   Ht 5\' 11"  (1.803 m)   Wt 272 lb (123.4 kg)   SpO2 95% Comment: RA  BMI 37.94 kg/m  Physical Exam      Exam    General- alert and comfortable    Neck- no JVD, no cervical adenopathy palpable, no carotid bruit   Lungs- clear without rales, wheezes   Cor- regular rate and rhythm, no murmur , gallop   Abdomen- soft, non-tender   Extremities - warm, non-tender, minimal edema   Neuro- oriented, appropriate, no focal weakness    Wound-I personally did the dressing change today examined the wound.  There is minimal purulent drainage today.  The depth of the tunnel is approximately 2-2.5 cm.  Diagnostic Tests:   Impression: The patient will finish any doxycycline that is left in his prescription but no refill. The patient's family will continue daily packing with 2 x 2 gauze wet-to-dry. The patient will return twice weekly on Mondays and Wednesdays in order to optimize wound healing and to make sure the wound is properly packed. Plan: Return on November 15 for wound check and dressing change  [name], MD [facility] [phone]

## 2019-12-29 ENCOUNTER — Ambulatory Visit (INDEPENDENT_AMBULATORY_CARE_PROVIDER_SITE_OTHER): Payer: BC Managed Care – PPO | Admitting: Cardiothoracic Surgery

## 2019-12-29 ENCOUNTER — Encounter: Payer: Self-pay | Admitting: Cardiothoracic Surgery

## 2019-12-29 ENCOUNTER — Other Ambulatory Visit: Payer: Self-pay

## 2019-12-29 VITALS — HR 133 | Resp 18 | Wt 273.0 lb

## 2019-12-29 DIAGNOSIS — M009 Pyogenic arthritis, unspecified: Secondary | ICD-10-CM

## 2019-12-29 MED ORDER — TRAMADOL HCL 50 MG PO TABS: 50.0000 mg | ORAL_TABLET | Freq: Two times a day (BID) | ORAL | 0 refills | Status: DC | PRN

## 2019-12-29 NOTE — Progress Notes (Signed)
PCP is Ignatius Specking, MD Referring Provider is Ignatius Specking, MD  Chief Complaint  Patient presents with  . Wound Check    sternoclavicular joint    HPI: Patient presents for a wound check and dressing change.  He has a small persistent nonhealing wound in the right sternoclavicular joint area from previous joint abscess which has been debrided x2 and now treated with daily packing.  There is minimal purulent drainage.  He is packing the wound at home with gauze which will change from a 2 x 2 to half-inch Nu Gauze saline wet-to-dry.  I examined the wound today and repacked it with 1/4 inch Nu Gauze saline wet-to-dry since the tract is fairly narrow and is about 2.5-3 cm deep.  He is currently off antibiotics.  Last culture was streptococcal contaminant.  Past Medical History:  Diagnosis Date  . Anemia   . Arthritis    rheumatoid  . Asthma   . Bipolar disorder (HCC)   . Depression   . GERD (gastroesophageal reflux disease)   . Hypertension 2019  . Ischemic colitis (HCC) 09/2008  . MSSA (methicillin susceptible Staphylococcus aureus)    sternaoclavicular absecess drainage  . Osteoarthritis    sterum and right shoulder  . PTSD (post-traumatic stress disorder)   . Tachycardia     Past Surgical History:  Procedure Laterality Date  . APPENDECTOMY    . APPLICATION OF WOUND VAC Right 06/20/2019   Procedure: APPLICATION OF WOUND VAC;  Surgeon: [name], MD;  Location: [facility] OR;  Service: Vascular;  Laterality: Right;  . APPLICATION OF WOUND VAC Right 06/23/2019   Procedure: APPLICATION OF WOUND VAC;  Surgeon: [name], MD;  Location: [facility] OR;  Service: Thoracic;  Laterality: Right;  . COLONOSCOPY     x 2  . I & D EXTREMITY Right 05/31/2019   Procedure: IRRIGATION AND DEBRIDEMENT LOWER EXTREMITY;  Surgeon: Tarry Kos, MD;  Location: [facility] OR;  Service: Orthopedics;  Laterality: Right;  . I & D EXTREMITY Right 06/20/2019   Procedure: DEBRIDEMENT OF STERNOCLAVICULAR JOINT  ABSCESS;  Surgeon: [name], MD;  Location: [facility] OR;  Service: Vascular;  Laterality: Right;  . STERNAL WOUND DEBRIDEMENT Right 09/19/2019   Procedure: EXCISIONAL DEBRIDEMENT AND IRRIGATION OF RIGHT STERNOCLAVICULAR JOINT WOUND;  Surgeon: [name], MD;  Location: [facility] OR;  Service: Thoracic;  Laterality: Right;  . TEE WITHOUT CARDIOVERSION N/A 06/05/2019   Procedure: TRANSESOPHAGEAL ECHOCARDIOGRAM (TEE);  Surgeon: Lewayne Bunting, MD;  Location: [facility] ENDOSCOPY;  Service: Cardiovascular;  Laterality: N/A;  . WOUND EXPLORATION Right 06/23/2019   Procedure: irrigation and clean out right shoulder;  Surgeon: [name], MD;  Location: [facility] OR;  Service: Thoracic;  Laterality: Right;    Family History  Problem Relation Age of Onset  . Alcohol abuse Mother   . Depression Mother   . Drug abuse Mother   . Physical abuse Mother   . Sexual abuse Mother   . ADD / ADHD Paternal Aunt   . Alcohol abuse Maternal Grandfather   . Bipolar disorder Maternal Grandmother   . Schizophrenia Maternal Grandmother   . Drug abuse Maternal Grandmother   . Alcohol abuse Paternal Grandfather   . Depression Paternal Grandfather   . Depression Paternal Grandmother   . Drug abuse Paternal Grandmother     Social History Social History   Tobacco Use  . Smoking status: Current Every Day Smoker    Packs/day: 0.50    Years: 15.00  Pack years: 7.50    Types: Cigarettes  . Smokeless tobacco: Never Used  Vaping Use  . Vaping Use: Never used  Substance Use Topics  . Alcohol use: No    Comment: rare beer  . Drug use: No    Current Outpatient Medications  Medication Sig Dispense Refill  . atorvastatin (LIPITOR) 10 MG tablet Take 10 mg by mouth at bedtime.   12  . Cholecalciferol (VITAMIN D3) 50 MCG (2000 UT) TABS Take 2,000 mg by mouth daily.    . cloNIDine (CATAPRES) 0.2 MG tablet Take 1 tablet (0.2 mg total) by mouth 3 (three) times daily. 270 tablet 0  . lisinopril (PRINIVIL,ZESTRIL) 10 MG  tablet Take 10 mg by mouth daily.    [name] lithium carbonate (ESKALITH) 450 MG CR tablet Take 1 tablet (450 mg total) by mouth at bedtime. Take with 300 mg to equal 750 mg at bedtime 90 tablet 0  . lithium carbonate (LITHOBID) 300 MG CR tablet Take total of 750 mg at night along with 400 mg tab 90 tablet 0  . metroNIDAZOLE (FLAGYL) 500 MG tablet Take 500 mg by mouth 3 (three) times daily.    . Multiple Vitamin (MULTIVITAMIN WITH MINERALS) TABS tablet Take 1 tablet by mouth daily.    [name] omeprazole (PRILOSEC) 20 MG capsule Take 20 mg by mouth daily.    . risperidone (RISPERDAL) 4 MG tablet Take 1 tablet (4 mg total) by mouth at bedtime. 90 tablet 0  . [START ON 01/03/2020] sertraline (ZOLOFT) 100 MG tablet Take 1.5 tablets (150 mg total) by mouth daily. 135 tablet 0  . traMADol (ULTRAM) 50 MG tablet Take 1 tablet (50 mg total) by mouth every 12 (twelve) hours as needed for severe pain. 40 tablet 0  . traZODone (DESYREL) 150 MG tablet Take 1 tablet (150 mg total) by mouth at bedtime. 90 tablet 0  . zinc gluconate 50 MG tablet Take 50 mg by mouth daily.     No current facility-administered medications for this visit.    No Known Allergies  Review of Systems  Persistent pain over the area of the wound.  He takes tramadol twice a day.  No fever.  BP 120/72 (BP Location: Left Arm, Patient Position: Sitting, Cuff Size: Normal)   Pulse (!) 133   Resp 18   Wt 273 lb (123.8 kg)   SpO2 93% Comment: RA  BMI 38.08 kg/m  Physical Exam Alert and comfortable and responsive Today the wound remains fairly narrow at the exit point about 1/4 inch and tracks about 3 cm deep.  It is fairly clean.  I applied some human activated collagen and then packed it with 1/4 inch wet-to-dry saline Nu Gauze  Diagnostic Tests: None  Impression: Continue with daily dressing changes at home  Plan: Return for office visit with wound check and dressing change on Wednesday, November 17  [name], MD [facility] [phone]

## 2019-12-31 ENCOUNTER — Other Ambulatory Visit: Payer: Self-pay

## 2019-12-31 ENCOUNTER — Encounter: Payer: Self-pay | Admitting: Cardiothoracic Surgery

## 2019-12-31 ENCOUNTER — Ambulatory Visit (INDEPENDENT_AMBULATORY_CARE_PROVIDER_SITE_OTHER): Payer: BC Managed Care – PPO | Admitting: Cardiothoracic Surgery

## 2019-12-31 VITALS — BP 118/85 | HR 122 | Resp 18

## 2019-12-31 DIAGNOSIS — M009 Pyogenic arthritis, unspecified: Secondary | ICD-10-CM | POA: Diagnosis not present

## 2019-12-31 NOTE — Progress Notes (Signed)
PCP is Ignatius Specking, MD Referring Provider is Ignatius Specking, MD  Chief Complaint  Patient presents with  . Wound Check    HPI: The patient presents for wound check and wound packing.  He has a 3 cm sinus tract into the area of a previous sternoclavicular joint abscess which has been excised and debrided twice.  Last culture was a ubiquitous strep species.  He is off antibiotics now.  I am injecting activated human collagen to try to stimulate filling in the defect with half-inch Nu Gauze saline wet-to-dry.  I pulled the Nu Gauze out from the home dressing.  It was in an appropriate depth.  I cleaned out the sinus tract with dry Q-tips and injected 1/4 cc of activated human collagen gel.  I packed 1/2 inch saline wet-to-dry Nu Gauze into the sinus tract for covered with a dry dressing and Tegaderm.  There is no purulence in the wound appears to be firm with granulation.   Past Medical History:  Diagnosis Date  . Anemia   . Arthritis    rheumatoid  . Asthma   . Bipolar disorder (HCC)   . Depression   . GERD (gastroesophageal reflux disease)   . Hypertension 2019  . Ischemic colitis (HCC) 09/2008  . MSSA (methicillin susceptible Staphylococcus aureus)    sternaoclavicular absecess drainage  . Osteoarthritis    sterum and right shoulder  . PTSD (post-traumatic stress disorder)   . Tachycardia     Past Surgical History:  Procedure Laterality Date  . APPENDECTOMY    . APPLICATION OF WOUND VAC Right 06/20/2019   Procedure: APPLICATION OF WOUND VAC;  Surgeon: [name], MD;  Location: [facility] OR;  Service: Vascular;  Laterality: Right;  . APPLICATION OF WOUND VAC Right 06/23/2019   Procedure: APPLICATION OF WOUND VAC;  Surgeon: [name], MD;  Location: [facility] OR;  Service: Thoracic;  Laterality: Right;  . COLONOSCOPY     x 2  . I & D EXTREMITY Right 05/31/2019   Procedure: IRRIGATION AND DEBRIDEMENT LOWER EXTREMITY;  Surgeon: Tarry Kos, MD;  Location: [facility] OR;  Service:  Orthopedics;  Laterality: Right;  . I & D EXTREMITY Right 06/20/2019   Procedure: DEBRIDEMENT OF STERNOCLAVICULAR JOINT ABSCESS;  Surgeon: [name], MD;  Location: [facility] OR;  Service: Vascular;  Laterality: Right;  . STERNAL WOUND DEBRIDEMENT Right 09/19/2019   Procedure: EXCISIONAL DEBRIDEMENT AND IRRIGATION OF RIGHT STERNOCLAVICULAR JOINT WOUND;  Surgeon: [name], MD;  Location: [facility] OR;  Service: Thoracic;  Laterality: Right;  . TEE WITHOUT CARDIOVERSION N/A 06/05/2019   Procedure: TRANSESOPHAGEAL ECHOCARDIOGRAM (TEE);  Surgeon: Lewayne Bunting, MD;  Location: [facility] ENDOSCOPY;  Service: Cardiovascular;  Laterality: N/A;  . WOUND EXPLORATION Right 06/23/2019   Procedure: irrigation and clean out right shoulder;  Surgeon: [name], MD;  Location: [facility] OR;  Service: Thoracic;  Laterality: Right;    Family History  Problem Relation Age of Onset  . Alcohol abuse Mother   . Depression Mother   . Drug abuse Mother   . Physical abuse Mother   . Sexual abuse Mother   . ADD / ADHD Paternal Aunt   . Alcohol abuse Maternal Grandfather   . Bipolar disorder Maternal Grandmother   . Schizophrenia Maternal Grandmother   . Drug abuse Maternal Grandmother   . Alcohol abuse Paternal Grandfather   . Depression Paternal Grandfather   . Depression Paternal Grandmother   . Drug abuse Paternal Grandmother  Social History Social History   Tobacco Use  . Smoking status: Current Every Day Smoker    Packs/day: 0.50    Years: 15.00    Pack years: 7.50    Types: Cigarettes  . Smokeless tobacco: Never Used  Vaping Use  . Vaping Use: Never used  Substance Use Topics  . Alcohol use: No    Comment: rare beer  . Drug use: No    Current Outpatient Medications  Medication Sig Dispense Refill  . atorvastatin (LIPITOR) 10 MG tablet Take 10 mg by mouth at bedtime.   12  . Cholecalciferol (VITAMIN D3) 50 MCG (2000 UT) TABS Take 2,000 mg by mouth daily.    . cloNIDine (CATAPRES) 0.2 MG  tablet Take 1 tablet (0.2 mg total) by mouth 3 (three) times daily. 270 tablet 0  . lisinopril (PRINIVIL,ZESTRIL) 10 MG tablet Take 10 mg by mouth daily.    [name] lithium carbonate (ESKALITH) 450 MG CR tablet Take 1 tablet (450 mg total) by mouth at bedtime. Take with 300 mg to equal 750 mg at bedtime 90 tablet 0  . lithium carbonate (LITHOBID) 300 MG CR tablet Take total of 750 mg at night along with 400 mg tab 90 tablet 0  . metroNIDAZOLE (FLAGYL) 500 MG tablet Take 500 mg by mouth 3 (three) times daily.    . Multiple Vitamin (MULTIVITAMIN WITH MINERALS) TABS tablet Take 1 tablet by mouth daily.    [name] omeprazole (PRILOSEC) 20 MG capsule Take 20 mg by mouth daily.    . risperidone (RISPERDAL) 4 MG tablet Take 1 tablet (4 mg total) by mouth at bedtime. 90 tablet 0  . [START ON 01/03/2020] sertraline (ZOLOFT) 100 MG tablet Take 1.5 tablets (150 mg total) by mouth daily. 135 tablet 0  . traMADol (ULTRAM) 50 MG tablet Take 1 tablet (50 mg total) by mouth every 12 (twelve) hours as needed for severe pain. 40 tablet 0  . traZODone (DESYREL) 150 MG tablet Take 1 tablet (150 mg total) by mouth at bedtime. 90 tablet 0  . zinc gluconate 50 MG tablet Take 50 mg by mouth daily.     No current facility-administered medications for this visit.    No Known Allergies  Review of Systems  No fever Persistent intermittent mild-moderate right shoulder pain  BP 118/85 (BP Location: Left Arm, Patient Position: Sitting)   Pulse (!) 122   Resp 18   SpO2 95% Comment: RA with mask on Physical Exam Alert and comfortable Sinus tachycardia Rate without murmur Lungs clear Wound without cellulitis, warmth, or drainage  Diagnostic Tests: None  Impression: Continue daily dressing changes at home with half-inch Nu Gauze saline wet-to-dry.  Return for office visit in 5 days for wound packing The patient continues to smoke and I have clearly told him that that is inhibiting his wound healing and he  understands.   Plan: Office appointment made for 5 days.  Patient is being referred for second opinion at the wound center-Dr. Dillingham's clinic.   [name], MD [facility] [phone]

## 2020-01-05 ENCOUNTER — Other Ambulatory Visit: Payer: Self-pay

## 2020-01-05 ENCOUNTER — Ambulatory Visit (INDEPENDENT_AMBULATORY_CARE_PROVIDER_SITE_OTHER): Payer: BC Managed Care – PPO | Admitting: Cardiothoracic Surgery

## 2020-01-05 ENCOUNTER — Encounter: Payer: Self-pay | Admitting: Cardiothoracic Surgery

## 2020-01-05 VITALS — BP 145/80 | HR 132 | Temp 99.5°F | Resp 20 | Ht 71.0 in | Wt 273.0 lb

## 2020-01-05 DIAGNOSIS — M009 Pyogenic arthritis, unspecified: Secondary | ICD-10-CM

## 2020-01-05 NOTE — Progress Notes (Signed)
PCP is Ignatius Specking, MD Referring Provider is Ignatius Specking, MD  Chief Complaint  Patient presents with  . Wound Check    Septic arthritis sternoclavicular joint    HPI: The patient returns for a surgical dressing change.  He is now coming to the office twice a week to make sure that the wound in his right sternoclavicular region is properly packed.  Since the last debridement and wound VAC the incision is healed except for a small narrow sinus tract which continues to drain thick fluid.  Cultures have shown a ubiquitous streptococcal presence.  He has been on antibiotics extensively.  His last CT scan showed no evidence of deeper collection or osteomyelitis.  I remove the dressing in place.  The packing was just laying on top of the wound that was placed at home.  I cleaned the wound with dry Q-tip then applied topical silver nitrate then applied 1/2 inch Nu Gauze packing into the tunnel.  We will continue with dry dressing changes because of the amount of drainage.  We will try a course of quarter inch iodoform gauze.   Past Medical History:  Diagnosis Date  . Anemia   . Arthritis    rheumatoid  . Asthma   . Bipolar disorder (HCC)   . Depression   . GERD (gastroesophageal reflux disease)   . Hypertension 2019  . Ischemic colitis (HCC) 09/2008  . MSSA (methicillin susceptible Staphylococcus aureus)    sternaoclavicular absecess drainage  . Osteoarthritis    sterum and right shoulder  . PTSD (post-traumatic stress disorder)   . Tachycardia     Past Surgical History:  Procedure Laterality Date  . APPENDECTOMY    . APPLICATION OF WOUND VAC Right 06/20/2019   Procedure: APPLICATION OF WOUND VAC;  Surgeon: [name], MD;  Location: [facility] OR;  Service: Vascular;  Laterality: Right;  . APPLICATION OF WOUND VAC Right 06/23/2019   Procedure: APPLICATION OF WOUND VAC;  Surgeon: [name], MD;  Location: [facility] OR;  Service: Thoracic;  Laterality: Right;  . COLONOSCOPY     x 2  .  I & D EXTREMITY Right 05/31/2019   Procedure: IRRIGATION AND DEBRIDEMENT LOWER EXTREMITY;  Surgeon: Tarry Kos, MD;  Location: [facility] OR;  Service: Orthopedics;  Laterality: Right;  . I & D EXTREMITY Right 06/20/2019   Procedure: DEBRIDEMENT OF STERNOCLAVICULAR JOINT ABSCESS;  Surgeon: [name], MD;  Location: [facility] OR;  Service: Vascular;  Laterality: Right;  . STERNAL WOUND DEBRIDEMENT Right 09/19/2019   Procedure: EXCISIONAL DEBRIDEMENT AND IRRIGATION OF RIGHT STERNOCLAVICULAR JOINT WOUND;  Surgeon: [name], MD;  Location: [facility] OR;  Service: Thoracic;  Laterality: Right;  . TEE WITHOUT CARDIOVERSION N/A 06/05/2019   Procedure: TRANSESOPHAGEAL ECHOCARDIOGRAM (TEE);  Surgeon: Lewayne Bunting, MD;  Location: [facility] ENDOSCOPY;  Service: Cardiovascular;  Laterality: N/A;  . WOUND EXPLORATION Right 06/23/2019   Procedure: irrigation and clean out right shoulder;  Surgeon: [name], MD;  Location: [facility] OR;  Service: Thoracic;  Laterality: Right;    Family History  Problem Relation Age of Onset  . Alcohol abuse Mother   . Depression Mother   . Drug abuse Mother   . Physical abuse Mother   . Sexual abuse Mother   . ADD / ADHD Paternal Aunt   . Alcohol abuse Maternal Grandfather   . Bipolar disorder Maternal Grandmother   . Schizophrenia Maternal Grandmother   . Drug abuse Maternal Grandmother   . Alcohol  abuse Paternal Grandfather   . Depression Paternal Grandfather   . Depression Paternal Grandmother   . Drug abuse Paternal Grandmother     Social History Social History   Tobacco Use  . Smoking status: Current Every Day Smoker    Packs/day: 0.50    Years: 15.00    Pack years: 7.50    Types: Cigarettes  . Smokeless tobacco: Never Used  Vaping Use  . Vaping Use: Never used  Substance Use Topics  . Alcohol use: No    Comment: rare beer  . Drug use: No    Current Outpatient Medications  Medication Sig Dispense Refill  . atorvastatin (LIPITOR) 10 MG tablet Take 10 mg by  mouth at bedtime.   12  . Cholecalciferol (VITAMIN D3) 50 MCG (2000 UT) TABS Take 2,000 mg by mouth daily.    . cloNIDine (CATAPRES) 0.2 MG tablet Take 1 tablet (0.2 mg total) by mouth 3 (three) times daily. 270 tablet 0  . lisinopril (PRINIVIL,ZESTRIL) 10 MG tablet Take 10 mg by mouth daily.    [name] lithium carbonate (ESKALITH) 450 MG CR tablet Take 1 tablet (450 mg total) by mouth at bedtime. Take with 300 mg to equal 750 mg at bedtime 90 tablet 0  . lithium carbonate (LITHOBID) 300 MG CR tablet Take total of 750 mg at night along with 400 mg tab 90 tablet 0  . metroNIDAZOLE (FLAGYL) 500 MG tablet Take 500 mg by mouth 3 (three) times daily.    . Multiple Vitamin (MULTIVITAMIN WITH MINERALS) TABS tablet Take 1 tablet by mouth daily.    [name] omeprazole (PRILOSEC) 20 MG capsule Take 20 mg by mouth daily.    . risperidone (RISPERDAL) 4 MG tablet Take 1 tablet (4 mg total) by mouth at bedtime. 90 tablet 0  . sertraline (ZOLOFT) 100 MG tablet Take 1.5 tablets (150 mg total) by mouth daily. 135 tablet 0  . traMADol (ULTRAM) 50 MG tablet Take 1 tablet (50 mg total) by mouth every 12 (twelve) hours as needed for severe pain. 40 tablet 0  . traZODone (DESYREL) 150 MG tablet Take 1 tablet (150 mg total) by mouth at bedtime. 90 tablet 0  . zinc gluconate 50 MG tablet Take 50 mg by mouth daily.     No current facility-administered medications for this visit.    No Known Allergies  Review of Systems  To cut back on his cigarette smoking currently probably a half a pack a day No fever Minimal pain Packs the wound daily with the help of his family  BP (!) 145/80 (BP Location: Right Arm, Patient Position: Sitting, Cuff Size: Normal)   Pulse (!) 132   Temp 99.5 F (37.5 C) (Oral)   Resp 20   Ht 5\' 11"  (1.803 m)   Wt 273 lb (123.8 kg)   SpO2 93% Comment: RA  BMI 38.08 kg/m  Physical Exam Alert and comfortable Clear This tachycardia Wound repacked with quarter inch Nu Gauze today, plan to transition  to iodoform gauze 1/4 inch strips  Diagnostic Tests: None  Impression: Nonhealing sinus tract remains approximately 2.5 cm deep near the sternoclavicular joint  Plan:  Continue daily packing.  Continue visiting the office twice a week to ensure the packing is performed properly. , MD [facility] [phone]

## 2020-01-07 ENCOUNTER — Ambulatory Visit: Payer: BC Managed Care – PPO

## 2020-01-12 ENCOUNTER — Telehealth: Payer: Self-pay

## 2020-01-12 NOTE — Telephone Encounter (Signed)
Patient's mother, Herbert Walsh contacted the office concerned that he has COVID. She stated that she tested positive last week and he is now displaying some Covid symptoms.  She stated that he is congested, cough, runny nose, and body aches.  Per Dr. [name] patient to be seen back in the office after symptoms resolve around 10 days.  She was made aware to continue dressing changes as Dr. [name] has been doing in the office and at home.  1/4 inch Iodoform packing, 2x2, 4x4, and tegaderm dressing changes daily.  She acknowledged receipt.  Patient has appt for Thursday, 01/22/20 at 3pm and is to contact the office before coming.
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
## 2020-01-13 ENCOUNTER — Institutional Professional Consult (permissible substitution): Payer: BC Managed Care – PPO | Admitting: Plastic Surgery
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
## 2020-01-16 ENCOUNTER — Encounter: Payer: Self-pay | Admitting: Psychiatry

## 2020-01-16 ENCOUNTER — Other Ambulatory Visit: Payer: Self-pay

## 2020-01-16 ENCOUNTER — Telehealth (INDEPENDENT_AMBULATORY_CARE_PROVIDER_SITE_OTHER): Payer: BC Managed Care – PPO | Admitting: Psychiatry

## 2020-01-16 DIAGNOSIS — F330 Major depressive disorder, recurrent, mild: Secondary | ICD-10-CM

## 2020-01-16 DIAGNOSIS — F4310 Post-traumatic stress disorder, unspecified: Secondary | ICD-10-CM | POA: Diagnosis not present

## 2020-01-16 DIAGNOSIS — F6381 Intermittent explosive disorder: Secondary | ICD-10-CM | POA: Diagnosis not present

## 2020-01-16 MED ORDER — LITHIUM CARBONATE ER 300 MG PO TBCR: EXTENDED_RELEASE_TABLET | ORAL | 0 refills | Status: DC

## 2020-01-16 MED ORDER — RISPERIDONE 4 MG PO TABS: 4.0000 mg | ORAL_TABLET | Freq: Every day | ORAL | 0 refills | Status: DC

## 2020-01-16 MED ORDER — TRAZODONE HCL 150 MG PO TABS: 150.0000 mg | ORAL_TABLET | Freq: Every day | ORAL | 0 refills | Status: DC

## 2020-01-16 MED ORDER — CLONIDINE HCL 0.2 MG PO TABS: 0.2000 mg | ORAL_TABLET | Freq: Three times a day (TID) | ORAL | 0 refills | Status: DC

## 2020-01-16 MED ORDER — LITHIUM CARBONATE ER 450 MG PO TBCR: 450.0000 mg | EXTENDED_RELEASE_TABLET | Freq: Every day | ORAL | 0 refills | Status: DC

## 2020-01-22 ENCOUNTER — Other Ambulatory Visit: Payer: Self-pay

## 2020-01-22 ENCOUNTER — Encounter: Payer: Self-pay | Admitting: Cardiothoracic Surgery

## 2020-01-22 ENCOUNTER — Telehealth: Payer: BC Managed Care – PPO | Admitting: Psychiatry

## 2020-01-22 ENCOUNTER — Telehealth (HOSPITAL_COMMUNITY): Payer: BC Managed Care – PPO | Admitting: Psychiatry

## 2020-01-22 ENCOUNTER — Ambulatory Visit (INDEPENDENT_AMBULATORY_CARE_PROVIDER_SITE_OTHER): Payer: BC Managed Care – PPO | Admitting: Cardiothoracic Surgery

## 2020-01-22 VITALS — BP 123/80 | HR 137 | Resp 18 | Ht 71.0 in

## 2020-01-22 DIAGNOSIS — M009 Pyogenic arthritis, unspecified: Secondary | ICD-10-CM

## 2020-01-22 DIAGNOSIS — U071 COVID-19: Secondary | ICD-10-CM | POA: Diagnosis not present

## 2020-01-22 MED ORDER — TRAMADOL HCL 50 MG PO TABS: 50.0000 mg | ORAL_TABLET | Freq: Two times a day (BID) | ORAL | 0 refills | Status: AC | PRN

## 2020-01-22 NOTE — Progress Notes (Signed)
PCP is Ignatius Specking, MD Referring Provider is Ignatius Specking, MD  Chief Complaint  Patient presents with  . Wound Check    HPI: Returns for wound check Has not been seen at office > 2 wks   because of Covid 19 infection.Received monoclonal antibody therapy and had mild course  [place] twound packed daily at home with 1/4 inch iodoform. Drainage less thick, lower volume since using iodoform w/o activated collagen   Past Medical History:  Diagnosis Date  . Anemia   . Arthritis    rheumatoid  . Asthma   . Bipolar disorder (HCC)   . Depression   . GERD (gastroesophageal reflux disease)   . Hypertension 2019  . Ischemic colitis (HCC) 09/2008  . MSSA (methicillin susceptible Staphylococcus aureus)    sternaoclavicular absecess drainage  . Osteoarthritis    sterum and right shoulder  . PTSD (post-traumatic stress disorder)   . Tachycardia     Past Surgical History:  Procedure Laterality Date  . APPENDECTOMY    . APPLICATION OF WOUND VAC Right 06/20/2019   Procedure: APPLICATION OF WOUND VAC;  Surgeon: [name], MD;  Location: [facility] OR;  Service: Vascular;  Laterality: Right;  . APPLICATION OF WOUND VAC Right 06/23/2019   Procedure: APPLICATION OF WOUND VAC;  Surgeon: [name], MD;  Location: [facility] OR;  Service: Thoracic;  Laterality: Right;  . COLONOSCOPY     x 2  . I & D EXTREMITY Right 05/31/2019   Procedure: IRRIGATION AND DEBRIDEMENT LOWER EXTREMITY;  Surgeon: Tarry Kos, MD;  Location: [facility] OR;  Service: Orthopedics;  Laterality: Right;  . I & D EXTREMITY Right 06/20/2019   Procedure: DEBRIDEMENT OF STERNOCLAVICULAR JOINT ABSCESS;  Surgeon: [name], MD;  Location: [facility] OR;  Service: Vascular;  Laterality: Right;  . STERNAL WOUND DEBRIDEMENT Right 09/19/2019   Procedure: EXCISIONAL DEBRIDEMENT AND IRRIGATION OF RIGHT STERNOCLAVICULAR JOINT WOUND;  Surgeon: [name], MD;  Location: [facility] OR;  Service: Thoracic;  Laterality: Right;  . TEE WITHOUT  CARDIOVERSION N/A 06/05/2019   Procedure: TRANSESOPHAGEAL ECHOCARDIOGRAM (TEE);  Surgeon: Lewayne Bunting, MD;  Location: [facility] ENDOSCOPY;  Service: Cardiovascular;  Laterality: N/A;  . WOUND EXPLORATION Right 06/23/2019   Procedure: irrigation and clean out right shoulder;  Surgeon: [name], MD;  Location: [facility] OR;  Service: Thoracic;  Laterality: Right;    Family History  Problem Relation Age of Onset  . Alcohol abuse Mother   . Depression Mother   . Drug abuse Mother   . Physical abuse Mother   . Sexual abuse Mother   . ADD / ADHD Paternal Aunt   . Alcohol abuse Maternal Grandfather   . Bipolar disorder Maternal Grandmother   . Schizophrenia Maternal Grandmother   . Drug abuse Maternal Grandmother   . Alcohol abuse Paternal Grandfather   . Depression Paternal Grandfather   . Depression Paternal Grandmother   . Drug abuse Paternal Grandmother     Social History Social History   Tobacco Use  . Smoking status: Current Every Day Smoker    Packs/day: 0.50    Years: 15.00    Pack years: 7.50    Types: Cigarettes  . Smokeless tobacco: Never Used  Vaping Use  . Vaping Use: Never used  Substance Use Topics  . Alcohol use: No    Comment: rare beer  . Drug use: No    Current Outpatient Medications  Medication Sig Dispense Refill  . atorvastatin (LIPITOR) 10  MG tablet Take 10 mg by mouth at bedtime.   12  . Cholecalciferol (VITAMIN D3) 50 MCG (2000 UT) TABS Take 2,000 mg by mouth daily.    [name] ON 02/09/2020] cloNIDine (CATAPRES) 0.2 MG tablet Take 1 tablet (0.2 mg total) by mouth 3 (three) times daily. 270 tablet 0  . lisinopril (PRINIVIL,ZESTRIL) 10 MG tablet Take 10 mg by mouth daily.    [name] ON 02/10/2020] lithium carbonate (ESKALITH) 450 MG CR tablet Take 1 tablet (450 mg total) by mouth at bedtime. Take with 300 mg to equal 750 mg at bedtime 90 tablet 0  . [START ON 02/10/2020] lithium carbonate (LITHOBID) 300 MG CR tablet Take total of 750 mg at night along  with 400 mg tab 90 tablet 0  . metroNIDAZOLE (FLAGYL) 500 MG tablet Take 500 mg by mouth 3 (three) times daily.    . Multiple Vitamin (MULTIVITAMIN WITH MINERALS) TABS tablet Take 1 tablet by mouth daily.    [name] omeprazole (PRILOSEC) 20 MG capsule Take 20 mg by mouth daily.    [name] ON 03/10/2020] risperidone (RISPERDAL) 4 MG tablet Take 1 tablet (4 mg total) by mouth at bedtime. 90 tablet 0  . sertraline (ZOLOFT) 100 MG tablet Take 1.5 tablets (150 mg total) by mouth daily. 135 tablet 0  . traMADol (ULTRAM) 50 MG tablet Take 1 tablet (50 mg total) by mouth every 12 (twelve) hours as needed for severe pain. 40 tablet 0  . [START ON 02/10/2020] traZODone (DESYREL) 150 MG tablet Take 1 tablet (150 mg total) by mouth at bedtime. 90 tablet 0  . zinc gluconate 50 MG tablet Take 50 mg by mouth daily.     No current facility-administered medications for this visit.    No Known Allergies  Review of Systems   No fever Wt stable  BP 123/80 (BP Location: Left Arm, Patient Position: Sitting)   Pulse (!) 137   Resp 18   Ht 5\' 11"  (1.803 m)   SpO2 97% Comment: RA with mask on  BMI 38.08 kg/m  Physical Exam Wound examined and personally repacked. Depth remains 3 cm tunnel with solid granulation tissue and no purulence or cellulitis  Diagnostic Tests: none  Impression: Continue daily dfessing changes at home Return to clinic in 6 days  Plan:   , MD [facility] [phone]

## 2020-01-28 ENCOUNTER — Ambulatory Visit (INDEPENDENT_AMBULATORY_CARE_PROVIDER_SITE_OTHER): Payer: BC Managed Care – PPO | Admitting: Cardiothoracic Surgery

## 2020-01-28 ENCOUNTER — Other Ambulatory Visit: Payer: Self-pay

## 2020-01-28 ENCOUNTER — Encounter: Payer: Self-pay | Admitting: Cardiothoracic Surgery

## 2020-01-28 VITALS — BP 132/88 | HR 127 | Resp 18

## 2020-01-28 DIAGNOSIS — M009 Pyogenic arthritis, unspecified: Secondary | ICD-10-CM

## 2020-01-28 NOTE — Progress Notes (Signed)
  HPI: Patient returns for routine postoperative follow-up . The patient has a small persistent sinus tract at the site of previous surgical debridement for a right sternoclavicular joint MSSA infection.  Cultures have been negative.  The wound is being treated with daily packing with 1/4 inch iodoform strip.  The wound remains approximately 2.5 cm deep without fluctuance.  CT scan shows no evidence of osteomyelitis or fluid collection.  Patient tries to pack the wound daily.  He is trying to reduce his smoking.  He is trying to improve protein intake and take his zinc tablet daily.   Current Outpatient Medications  Medication Sig Dispense Refill  . atorvastatin (LIPITOR) 10 MG tablet Take 10 mg by mouth at bedtime.   12  . Cholecalciferol (VITAMIN D3) 50 MCG (2000 UT) TABS Take 2,000 mg by mouth daily.    [name] ON 02/09/2020] cloNIDine (CATAPRES) 0.2 MG tablet Take 1 tablet (0.2 mg total) by mouth 3 (three) times daily. 270 tablet 0  . lisinopril (PRINIVIL,ZESTRIL) 10 MG tablet Take 10 mg by mouth daily.    [name] ON 02/10/2020] lithium carbonate (ESKALITH) 450 MG CR tablet Take 1 tablet (450 mg total) by mouth at bedtime. Take with 300 mg to equal 750 mg at bedtime 90 tablet 0  . [START ON 02/10/2020] lithium carbonate (LITHOBID) 300 MG CR tablet Take total of 750 mg at night along with 400 mg tab 90 tablet 0  . metroNIDAZOLE (FLAGYL) 500 MG tablet Take 500 mg by mouth 3 (three) times daily.    . Multiple Vitamin (MULTIVITAMIN WITH MINERALS) TABS tablet Take 1 tablet by mouth daily.    [name] omeprazole (PRILOSEC) 20 MG capsule Take 20 mg by mouth daily.    [name] ON 03/10/2020] risperidone (RISPERDAL) 4 MG tablet Take 1 tablet (4 mg total) by mouth at bedtime. 90 tablet 0  . sertraline (ZOLOFT) 100 MG tablet Take 1.5 tablets (150 mg total) by mouth daily. 135 tablet 0  . traMADol (ULTRAM) 50 MG tablet Take 1 tablet (50 mg total) by mouth every 12 (twelve) hours as needed for up to 7 days for  moderate pain or severe pain. 14 tablet 0  . [START ON 02/10/2020] traZODone (DESYREL) 150 MG tablet Take 1 tablet (150 mg total) by mouth at bedtime. 90 tablet 0  . zinc gluconate 50 MG tablet Take 50 mg by mouth daily.     No current facility-administered medications for this visit.    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  Continue daily packing.  Stop smoking.  Per the patient's request the patient will be evaluated by Dr. Ulice Bold at the reconstructive wound clinic later this month  Plan: Turn to this clinic in 1 week for dressing change and wound exam.   [name], MD [facility] [phone]

## 2020-02-03 ENCOUNTER — Ambulatory Visit (INDEPENDENT_AMBULATORY_CARE_PROVIDER_SITE_OTHER): Payer: BC Managed Care – PPO | Admitting: *Deleted

## 2020-02-03 ENCOUNTER — Other Ambulatory Visit: Payer: Self-pay

## 2020-02-03 VITALS — BP 112/76 | HR 118

## 2020-02-03 DIAGNOSIS — M009 Pyogenic arthritis, unspecified: Secondary | ICD-10-CM

## 2020-02-03 DIAGNOSIS — Z4802 Encounter for removal of sutures: Secondary | ICD-10-CM

## 2020-02-03 NOTE — Progress Notes (Signed)
Mr. Herbert Walsh arrived with his mother for nurse wound check per Dr. [name]. Per mother, wound packing changed immediately prior to appt. Previous dressing removed for assessment but Iodoform packing kept in. Scant serosanguineous drainage noted on dressing. Site within normal limits. 2x2 & 4x4 gauze replaced with 4x4 tegaderm. Pt provided with more 1/4in Iodoform packing. Pt and family very appreciative of Dr. Zenaida Niece Trigt's care over the past few months. Pt states he is to f/u with Dr. Ulice Bold next week.

## 2020-02-10 ENCOUNTER — Ambulatory Visit (INDEPENDENT_AMBULATORY_CARE_PROVIDER_SITE_OTHER): Payer: BC Managed Care – PPO | Admitting: Plastic Surgery

## 2020-02-10 ENCOUNTER — Encounter: Payer: Self-pay | Admitting: Plastic Surgery

## 2020-02-10 ENCOUNTER — Other Ambulatory Visit: Payer: Self-pay

## 2020-02-10 VITALS — BP 117/82 | HR 112 | Temp 96.8°F | Ht 72.0 in | Wt 275.0 lb

## 2020-02-10 DIAGNOSIS — M009 Pyogenic arthritis, unspecified: Secondary | ICD-10-CM

## 2020-02-10 DIAGNOSIS — M069 Rheumatoid arthritis, unspecified: Secondary | ICD-10-CM | POA: Diagnosis not present

## 2020-02-10 NOTE — Progress Notes (Signed)
Patient ID: Herbert Walsh, male    DOB: February 15, 1990, 29 y.o.   MRN: [id number]   No chief complaint on file.   The patient is a 29 year old male here for evaluation of his chest.  The patient had an infection of his right sternoclavicular joint approximately 6 months ago.  He underwent 2 debridements by Dr. Maren Beach.  He thinks that it is getting better just very slowly.  He is getting flareups of his rheumatoid which is making things difficult.  He also has a tear of his right shoulder which cannot be fixed until the infection has been cleared.  They are currently packing it with Nu Gauze.  The opening is about 3 mm.  They state that there is quite a bit of drainage when they change the dressing.  It is approximately 3 cm deep.  The periwound area has some scarring but no sign of redness or infection at this time.   Review of Systems  Constitutional: Positive for activity change. Negative for appetite change.  HENT: Negative.   Eyes: Negative.   Respiratory: Negative.  Negative for cough and chest tightness.   Cardiovascular: Negative.   Gastrointestinal: Negative.   Genitourinary: Negative.   Musculoskeletal: Positive for myalgias.  Skin: Positive for wound.    Past Medical History:  Diagnosis Date  . Anemia   . Arthritis    rheumatoid  . Asthma   . Bipolar disorder (HCC)   . Depression   . GERD (gastroesophageal reflux disease)   . Hypertension 2019  . Ischemic colitis (HCC) 09/2008  . MSSA (methicillin susceptible Staphylococcus aureus)    sternaoclavicular absecess drainage  . Osteoarthritis    sterum and right shoulder  . PTSD (post-traumatic stress disorder)   . Tachycardia     Past Surgical History:  Procedure Laterality Date  . APPENDECTOMY    . APPLICATION OF WOUND VAC Right 06/20/2019   Procedure: APPLICATION OF WOUND VAC;  Surgeon: [name], MD;  Location: [facility] OR;  Service: Vascular;  Laterality: Right;  . APPLICATION OF WOUND VAC Right 06/23/2019    Procedure: APPLICATION OF WOUND VAC;  Surgeon: [name], MD;  Location: [facility] OR;  Service: Thoracic;  Laterality: Right;  . COLONOSCOPY     x 2  . I & D EXTREMITY Right 05/31/2019   Procedure: IRRIGATION AND DEBRIDEMENT LOWER EXTREMITY;  Surgeon: Tarry Kos, MD;  Location: [facility] OR;  Service: Orthopedics;  Laterality: Right;  . I & D EXTREMITY Right 06/20/2019   Procedure: DEBRIDEMENT OF STERNOCLAVICULAR JOINT ABSCESS;  Surgeon: [name], MD;  [facility] OR;  Service: Vascular;  Laterality: Right;  . STERNAL WOUND DEBRIDEMENT Right 09/19/2019   Procedure: EXCISIONAL DEBRIDEMENT AND IRRIGATION OF RIGHT STERNOCLAVICULAR JOINT WOUND;  Surgeon: [name], MD;  Location: [facility] OR;  Service: Thoracic;  Laterality: Right;  . TEE WITHOUT CARDIOVERSION N/A 06/05/2019   Procedure: TRANSESOPHAGEAL ECHOCARDIOGRAM (TEE);  Surgeon: Lewayne Bunting, MD;  Location: [facility] ENDOSCOPY;  Service: Cardiovascular;  Laterality: N/A;  . WOUND EXPLORATION Right 06/23/2019   Procedure: irrigation and clean out right shoulder;  Surgeon: [name], MD;  Location: [facility] OR;  Service: Thoracic;  Laterality: Right;      Current Outpatient Medications:  .  atorvastatin (LIPITOR) 10 MG tablet, Take 10 mg by mouth at bedtime. , Disp: , Rfl: 12 .  Cholecalciferol (VITAMIN D3) 50 MCG (2000 UT) TABS, Take 2,000 mg by mouth daily., Disp: , Rfl:  .  cloNIDine (CATAPRES) 0.2 MG tablet, Take 1 tablet (0.2 mg total) by mouth 3 (three) times daily., Disp: 270 tablet, Rfl: 0 .  lisinopril (PRINIVIL,ZESTRIL) 10 MG tablet, Take 10 mg by mouth daily., Disp: , Rfl:  .  lithium carbonate (ESKALITH) 450 MG CR tablet, Take 1 tablet (450 mg total) by mouth at bedtime. Take with 300 mg to equal 750 mg at bedtime, Disp: 90 tablet, Rfl: 0 .  lithium carbonate (LITHOBID) 300 MG CR tablet, Take total of 750 mg at night along with 400 mg tab, Disp: 90 tablet, Rfl: 0 .  metroNIDAZOLE (FLAGYL) 500 MG tablet, Take 500 mg by mouth 3  (three) times daily. (Patient not taking: Reported on 02/10/2020), Disp: , Rfl:  .  Multiple Vitamin (MULTIVITAMIN WITH MINERALS) TABS tablet, Take 1 tablet by mouth daily., Disp: , Rfl:  .  omeprazole (PRILOSEC) 20 MG capsule, Take 20 mg by mouth daily., Disp: , Rfl:  .  [START ON 03/10/2020] risperidone (RISPERDAL) 4 MG tablet, Take 1 tablet (4 mg total) by mouth at bedtime., Disp: 90 tablet, Rfl: 0 .  sertraline (ZOLOFT) 100 MG tablet, Take 1.5 tablets (150 mg total) by mouth daily., Disp: 135 tablet, Rfl: 0 .  traZODone (DESYREL) 150 MG tablet, Take 1 tablet (150 mg total) by mouth at bedtime., Disp: 90 tablet, Rfl: 0 .  zinc gluconate 50 MG tablet, Take 50 mg by mouth daily., Disp: , Rfl:    Objective:   Vitals:   02/10/20 1259  BP: 117/82  Pulse: (!) 112  Temp: (!) 96.8 F (36 C)  SpO2: 97%    Physical Exam Vitals and nursing note reviewed.  Constitutional:      Appearance: Normal appearance.  HENT:     Head: Normocephalic and atraumatic.  Cardiovascular:     Rate and Rhythm: Normal rate.     Pulses: Normal pulses.  Pulmonary:     Effort: Pulmonary effort is normal. No respiratory distress.  Chest:    Musculoskeletal:        General: Tenderness present. No swelling.  Neurological:     General: No focal deficit present.     Mental Status: He is alert. Mental status is at baseline.  Psychiatric:        Mood and Affect: Mood normal.        Behavior: Behavior normal.     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  Pictures were obtained of the patient and placed in the chart with the patient's or guardian's permission.   Alena Bills Levana Minetti, DO

## 2020-02-25 ENCOUNTER — Other Ambulatory Visit (HOSPITAL_COMMUNITY): Payer: BC Managed Care – PPO

## 2020-02-26 ENCOUNTER — Telehealth: Payer: Self-pay | Admitting: Plastic Surgery

## 2020-02-26 NOTE — Telephone Encounter (Signed)
Spoke with patient and advised he has a Pre OP with Matt tomorrow at 12:00.  He can answer all his questions at that time. Patient understood and agreed. LMVM [phone]

## 2020-02-26 NOTE — Telephone Encounter (Signed)
Patient called to advise he has court on 1/31 and wanted to know if he will be able to go since his surgery is scheduled a few days ahead of that. Please call [phone] to advise

## 2020-02-27 ENCOUNTER — Other Ambulatory Visit (HOSPITAL_COMMUNITY): Payer: BC Managed Care – PPO

## 2020-02-27 ENCOUNTER — Ambulatory Visit (INDEPENDENT_AMBULATORY_CARE_PROVIDER_SITE_OTHER): Payer: BC Managed Care – PPO | Admitting: Surgical

## 2020-02-27 ENCOUNTER — Encounter: Payer: Self-pay | Admitting: Surgical

## 2020-02-27 DIAGNOSIS — M069 Rheumatoid arthritis, unspecified: Secondary | ICD-10-CM

## 2020-02-27 DIAGNOSIS — M009 Pyogenic arthritis, unspecified: Secondary | ICD-10-CM

## 2020-02-27 NOTE — Progress Notes (Addendum)
Rescheduled

## 2020-03-08 ENCOUNTER — Other Ambulatory Visit (HOSPITAL_COMMUNITY): Payer: BC Managed Care – PPO
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
## 2020-03-12 ENCOUNTER — Encounter: Payer: Self-pay | Admitting: Psychiatry

## 2020-03-12 ENCOUNTER — Other Ambulatory Visit: Payer: Self-pay

## 2020-03-12 ENCOUNTER — Telehealth (INDEPENDENT_AMBULATORY_CARE_PROVIDER_SITE_OTHER): Payer: BC Managed Care – PPO | Admitting: Psychiatry

## 2020-03-12 DIAGNOSIS — F4310 Post-traumatic stress disorder, unspecified: Secondary | ICD-10-CM

## 2020-03-12 DIAGNOSIS — F330 Major depressive disorder, recurrent, mild: Secondary | ICD-10-CM

## 2020-03-12 DIAGNOSIS — F6381 Intermittent explosive disorder: Secondary | ICD-10-CM | POA: Diagnosis not present

## 2020-03-14 NOTE — Progress Notes (Signed)
ICD-10-CM   1. Septic arthritis of right sternoclavicular joint (HCC)  M00.9       Patient ID: Herbert Walsh, male    DOB: 1990-06-06, 30 y.o.   MRN: [id number]   History of Present Illness: Herbert Walsh is a 30 y.o.  male  with a history of septic arthritis of his right sternoclavicular joint.  He presents for preoperative evaluation for upcoming procedure, excision of right sternoclavicular joint infection and placement of ACell or cellerate, scheduled for 03/31/2020 with Dr. Ulice Bold.  Summary from previous visit: Approximately 6 months ago patient had infection of his right sternoclavicular joint.  He underwent 2 debridements by Dr. Maren Beach.  He is having flareups of his rheumatoid arthritis which is making things difficult for him.  He also has a tear of his right shoulder which cannot be fixed due to the infection has been cleared.  He is currently packing it with gauze.  The opening is approximately 3 mm.  PMH Significant for: MSSA infection, history of multiple septic arthritis events in different locations, multiple abscesses, rheumatoid arthritis, asthma, bipolar disorder, depression, HTN, GERD, PTSD  The patient has not had problems with anesthesia.   Past Medical History: Allergies: No Known Allergies  Current Medications:  Current Outpatient Medications:  .  atorvastatin (LIPITOR) 10 MG tablet, Take 10 mg by mouth at bedtime. , Disp: , Rfl: 12 .  Cholecalciferol (VITAMIN D3) 50 MCG (2000 UT) TABS, Take 2,000 mg by mouth daily., Disp: , Rfl:  .  cloNIDine (CATAPRES) 0.2 MG tablet, Take 1 tablet (0.2 mg total) by mouth 3 (three) times daily., Disp: 270 tablet, Rfl: 0 .  lisinopril (PRINIVIL,ZESTRIL) 10 MG tablet, Take 10 mg by mouth daily., Disp: , Rfl:  .  lithium carbonate (ESKALITH) 450 MG CR tablet, Take 1 tablet (450 mg total) by mouth at bedtime. Take with 300 mg to equal 750 mg at bedtime, Disp: 90 tablet, Rfl: 0 .  lithium carbonate (LITHOBID) 300 MG CR  tablet, Take total of 750 mg at night along with 400 mg tab, Disp: 90 tablet, Rfl: 0 .  metroNIDAZOLE (FLAGYL) 500 MG tablet, Take 500 mg by mouth 3 (three) times daily., Disp: , Rfl:  .  Multiple Vitamin (MULTIVITAMIN WITH MINERALS) TABS tablet, Take 1 tablet by mouth daily., Disp: , Rfl:  .  omeprazole (PRILOSEC) 20 MG capsule, Take 20 mg by mouth daily., Disp: , Rfl:  .  risperidone (RISPERDAL) 4 MG tablet, Take 1 tablet (4 mg total) by mouth at bedtime., Disp: 90 tablet, Rfl: 0 .  sertraline (ZOLOFT) 100 MG tablet, Take 1.5 tablets (150 mg total) by mouth daily., Disp: 135 tablet, Rfl: 0 .  traZODone (DESYREL) 150 MG tablet, Take 1 tablet (150 mg total) by mouth at bedtime., Disp: 90 tablet, Rfl: 0 .  zinc gluconate 50 MG tablet, Take 50 mg by mouth daily., Disp: , Rfl:   Past Medical Problems: Past Medical History:  Diagnosis Date  . Anemia   . Arthritis    rheumatoid  . Asthma   . Bipolar disorder (HCC)   . Depression   . GERD (gastroesophageal reflux disease)   . Hypertension 2019  . Ischemic colitis (HCC) 09/2008  . MSSA (methicillin susceptible Staphylococcus aureus)    sternaoclavicular absecess drainage  . Osteoarthritis    sterum and right shoulder  . PTSD (post-traumatic stress disorder)   . Tachycardia     Past Surgical History: Past Surgical History:  Procedure Laterality Date  .  APPENDECTOMY    . APPLICATION OF WOUND VAC Right 06/20/2019   Procedure: APPLICATION OF WOUND VAC;  Surgeon: [name], MD;  Location: [facility] OR;  Service: Vascular;  Laterality: Right;  . APPLICATION OF WOUND VAC Right 06/23/2019   Procedure: APPLICATION OF WOUND VAC;  Surgeon: [name], MD;  Location: [facility] OR;  Service: Thoracic;  Laterality: Right;  . COLONOSCOPY     x 2  . I & D EXTREMITY Right 05/31/2019   Procedure: IRRIGATION AND DEBRIDEMENT LOWER EXTREMITY;  Surgeon: Tarry Kos, MD;  Location: [facility] OR;  Service: Orthopedics;  Laterality: Right;  . I & D EXTREMITY Right  06/20/2019   Procedure: DEBRIDEMENT OF STERNOCLAVICULAR JOINT ABSCESS;  Surgeon: [name], MD;  Location: [facility] OR;  Service: Vascular;  Laterality: Right;  . STERNAL WOUND DEBRIDEMENT Right 09/19/2019   Procedure: EXCISIONAL DEBRIDEMENT AND IRRIGATION OF RIGHT STERNOCLAVICULAR JOINT WOUND;  Surgeon: [name], MD;  Location: [facility] OR;  Service: Thoracic;  Laterality: Right;  . TEE WITHOUT CARDIOVERSION N/A 06/05/2019   Procedure: TRANSESOPHAGEAL ECHOCARDIOGRAM (TEE);  Surgeon: Lewayne Bunting, MD;  Location: [facility] ENDOSCOPY;  Service: Cardiovascular;  Laterality: N/A;  . WOUND EXPLORATION Right 06/23/2019   Procedure: irrigation and clean out right shoulder;  Surgeon: [name], MD;  Location: [facility] OR;  Service: Thoracic;  Laterality: Right;    Social History: Social History   Socioeconomic History  . Marital status: Legally Separated    Spouse name: Not on file  . Number of children: Not on file  . Years of education: Not on file  . Highest education level: Not on file  Occupational History  . Not on file  Tobacco Use  . Smoking status: Current Every Day Smoker    Packs/day: 0.50    Years: 15.00    Pack years: 7.50    Types: Cigarettes  . Smokeless tobacco: Never Used  Vaping Use  . Vaping Use: Never used  Substance and Sexual Activity  . Alcohol use: No    Comment: rare beer  . Drug use: No  . Sexual activity: Not Currently  Other Topics Concern  . Not on file  Social History Narrative  . Not on file   Social Determinants of Health   Financial Resource Strain: Not on file  Food Insecurity: Not on file  Transportation Needs: Not on file  Physical Activity: Not on file  Stress: Not on file  Social Connections: Not on file  Intimate Partner Violence: Not on file    Family History: Family History  Problem Relation Age of Onset  . Alcohol abuse Mother   . Depression Mother   . Drug abuse Mother   . Physical abuse Mother   . Sexual abuse Mother   . ADD /  ADHD Paternal Aunt   . Alcohol abuse Maternal Grandfather   . Bipolar disorder Maternal Grandmother   . Schizophrenia Maternal Grandmother   . Drug abuse Maternal Grandmother   . Alcohol abuse Paternal Grandfather   . Depression Paternal Grandfather   . Depression Paternal Grandmother   . Drug abuse Paternal Grandmother     Review of Systems: Review of Systems  Constitutional: Negative for chills and fever.  HENT: Negative for congestion and sore throat.   Respiratory: Negative for cough and shortness of breath.   Cardiovascular: Negative for chest pain and palpitations.  Gastrointestinal: Negative for abdominal pain, nausea and vomiting.  Skin: Negative for itching and rash.  Non-healing wound on upper chest    Physical Exam: Vital Signs BP 119/82 (BP Location: Left Arm, Patient Position: Sitting, Cuff Size: Large)   Pulse (!) 144   Ht 6' (1.829 m)   Wt 265 lb 3.2 oz (120.3 kg)   SpO2 96%   BMI 35.97 kg/m  Physical Exam Vitals and nursing note reviewed.  Constitutional:      General: He is not in acute distress.    Appearance: Normal appearance. He is obese. He is not ill-appearing.  HENT:     Head: Normocephalic and atraumatic.  Eyes:     Extraocular Movements: Extraocular movements intact.  Cardiovascular:     Rate and Rhythm: Normal rate and regular rhythm.  Pulmonary:     Breath sounds: No stridor. No wheezing, rhonchi or rales.  Abdominal:     General: Bowel sounds are normal.     Palpations: Abdomen is soft.  Musculoskeletal:        General: Normal range of motion.     Cervical back: Normal range of motion.  Skin:    General: Skin is warm and dry.  Neurological:     General: No focal deficit present.     Mental Status: He is alert and oriented to person, place, and time.  Psychiatric:        Mood and Affect: Mood normal.        Behavior: Behavior normal.        Thought Content: Thought content normal.        Judgment: Judgment normal.     Pictures were obtained of the patient and placed in the chart with the patient's or guardian's permission.    Assessment/Plan:  Mr. Goines scheduled for excision of right sternoclavicular joint infection and placement of ACell or cellerate with Dr. Ulice Bold.  Risks, benefits, and alternatives of procedure discussed, questions answered and consent obtained.    Smoking Status: Current smoker; Counseling Given? Yes  Caprini Score: 2 Low; Risk Factors include: 30 year old male, BMI > 25, and length of planned surgery. Recommendation for mechanical or pharmacological prophylaxis during surgery. Encourage early ambulation.   Pictures obtained: 03/16/20  Post-op Rx sent to pharmacy: Norco, IBU, Tylenol, Keflex  Patient was provided with the General Surgical Risk consent document and Pain Medication Agreement prior to their appointment.  They had adequate time to read through the risk consent documents and Pain Medication Agreement. We also discussed them in person together during this preop appointment. All of their questions were answered to their satisfaction.  Recommended calling if they have any further questions.  Risk consent form and Pain Medication Agreement to be scanned into patient's chart.  Electronically signed by: Eldridge Abrahams, PA-C 03/16/2020 2:24 PM

## 2020-03-14 NOTE — H&P (View-Only) (Signed)
ICD-10-CM   1. Septic arthritis of right sternoclavicular joint (HCC)  M00.9       Patient ID: Herbert Walsh, male    DOB: 1990-06-06, 30 y.o.   MRN: [id number]   History of Present Illness: Herbert Walsh is a 30 y.o.  male  with a history of septic arthritis of his right sternoclavicular joint.  He presents for preoperative evaluation for upcoming procedure, excision of right sternoclavicular joint infection and placement of ACell or cellerate, scheduled for 03/31/2020 with Dr. Ulice Bold.  Summary from previous visit: Approximately 6 months ago patient had infection of his right sternoclavicular joint.  He underwent 2 debridements by Dr. Maren Beach.  He is having flareups of his rheumatoid arthritis which is making things difficult for him.  He also has a tear of his right shoulder which cannot be fixed due to the infection has been cleared.  He is currently packing it with gauze.  The opening is approximately 3 mm.  PMH Significant for: MSSA infection, history of multiple septic arthritis events in different locations, multiple abscesses, rheumatoid arthritis, asthma, bipolar disorder, depression, HTN, GERD, PTSD  The patient has not had problems with anesthesia.   Past Medical History: Allergies: No Known Allergies  Current Medications:  Current Outpatient Medications:  .  atorvastatin (LIPITOR) 10 MG tablet, Take 10 mg by mouth at bedtime. , Disp: , Rfl: 12 .  Cholecalciferol (VITAMIN D3) 50 MCG (2000 UT) TABS, Take 2,000 mg by mouth daily., Disp: , Rfl:  .  cloNIDine (CATAPRES) 0.2 MG tablet, Take 1 tablet (0.2 mg total) by mouth 3 (three) times daily., Disp: 270 tablet, Rfl: 0 .  lisinopril (PRINIVIL,ZESTRIL) 10 MG tablet, Take 10 mg by mouth daily., Disp: , Rfl:  .  lithium carbonate (ESKALITH) 450 MG CR tablet, Take 1 tablet (450 mg total) by mouth at bedtime. Take with 300 mg to equal 750 mg at bedtime, Disp: 90 tablet, Rfl: 0 .  lithium carbonate (LITHOBID) 300 MG CR  tablet, Take total of 750 mg at night along with 400 mg tab, Disp: 90 tablet, Rfl: 0 .  metroNIDAZOLE (FLAGYL) 500 MG tablet, Take 500 mg by mouth 3 (three) times daily., Disp: , Rfl:  .  Multiple Vitamin (MULTIVITAMIN WITH MINERALS) TABS tablet, Take 1 tablet by mouth daily., Disp: , Rfl:  .  omeprazole (PRILOSEC) 20 MG capsule, Take 20 mg by mouth daily., Disp: , Rfl:  .  risperidone (RISPERDAL) 4 MG tablet, Take 1 tablet (4 mg total) by mouth at bedtime., Disp: 90 tablet, Rfl: 0 .  sertraline (ZOLOFT) 100 MG tablet, Take 1.5 tablets (150 mg total) by mouth daily., Disp: 135 tablet, Rfl: 0 .  traZODone (DESYREL) 150 MG tablet, Take 1 tablet (150 mg total) by mouth at bedtime., Disp: 90 tablet, Rfl: 0 .  zinc gluconate 50 MG tablet, Take 50 mg by mouth daily., Disp: , Rfl:   Past Medical Problems: Past Medical History:  Diagnosis Date  . Anemia   . Arthritis    rheumatoid  . Asthma   . Bipolar disorder (HCC)   . Depression   . GERD (gastroesophageal reflux disease)   . Hypertension 2019  . Ischemic colitis (HCC) 09/2008  . MSSA (methicillin susceptible Staphylococcus aureus)    sternaoclavicular absecess drainage  . Osteoarthritis    sterum and right shoulder  . PTSD (post-traumatic stress disorder)   . Tachycardia     Past Surgical History: Past Surgical History:  Procedure Laterality Date  .  APPENDECTOMY    . APPLICATION OF WOUND VAC Right 06/20/2019   Procedure: APPLICATION OF WOUND VAC;  Surgeon: Van Trigt, Peter, MD;  Location: [facility] OR;  Service: Vascular;  Laterality: Right;  . APPLICATION OF WOUND VAC Right 06/23/2019   Procedure: APPLICATION OF WOUND VAC;  Surgeon: Van Trigt, Peter, MD;  Location: [facility] OR;  Service: Thoracic;  Laterality: Right;  . COLONOSCOPY     x 2  . I & D EXTREMITY Right 05/31/2019   Procedure: IRRIGATION AND DEBRIDEMENT LOWER EXTREMITY;  Surgeon: Xu, Naiping M, MD;  Location: [facility] OR;  Service: Orthopedics;  Laterality: Right;  . I & D EXTREMITY Right  06/20/2019   Procedure: DEBRIDEMENT OF STERNOCLAVICULAR JOINT ABSCESS;  Surgeon: Van Trigt, Peter, MD;  Location: [facility] OR;  Service: Vascular;  Laterality: Right;  . STERNAL WOUND DEBRIDEMENT Right 09/19/2019   Procedure: EXCISIONAL DEBRIDEMENT AND IRRIGATION OF RIGHT STERNOCLAVICULAR JOINT WOUND;  Surgeon: Van Trigt, Peter, MD;  Location: [facility] OR;  Service: Thoracic;  Laterality: Right;  . TEE WITHOUT CARDIOVERSION N/A 06/05/2019   Procedure: TRANSESOPHAGEAL ECHOCARDIOGRAM (TEE);  Surgeon: Crenshaw, Brian S, MD;  Location: [facility] ENDOSCOPY;  Service: Cardiovascular;  Laterality: N/A;  . WOUND EXPLORATION Right 06/23/2019   Procedure: irrigation and clean out right shoulder;  Surgeon: Van Trigt, Peter, MD;  Location: [facility] OR;  Service: Thoracic;  Laterality: Right;    Social History: Social History   Socioeconomic History  . Marital status: Legally Separated    Spouse name: Not on file  . Number of children: Not on file  . Years of education: Not on file  . Highest education level: Not on file  Occupational History  . Not on file  Tobacco Use  . Smoking status: Current Every Day Smoker    Packs/day: 0.50    Years: 15.00    Pack years: 7.50    Types: Cigarettes  . Smokeless tobacco: Never Used  Vaping Use  . Vaping Use: Never used  Substance and Sexual Activity  . Alcohol use: No    Comment: rare beer  . Drug use: No  . Sexual activity: Not Currently  Other Topics Concern  . Not on file  Social History Narrative  . Not on file   Social Determinants of Health   Financial Resource Strain: Not on file  Food Insecurity: Not on file  Transportation Needs: Not on file  Physical Activity: Not on file  Stress: Not on file  Social Connections: Not on file  Intimate Partner Violence: Not on file    Family History: Family History  Problem Relation Age of Onset  . Alcohol abuse Mother   . Depression Mother   . Drug abuse Mother   . Physical abuse Mother   . Sexual abuse Mother   . ADD /  ADHD Paternal Aunt   . Alcohol abuse Maternal Grandfather   . Bipolar disorder Maternal Grandmother   . Schizophrenia Maternal Grandmother   . Drug abuse Maternal Grandmother   . Alcohol abuse Paternal Grandfather   . Depression Paternal Grandfather   . Depression Paternal Grandmother   . Drug abuse Paternal Grandmother     Review of Systems: Review of Systems  Constitutional: Negative for chills and fever.  HENT: Negative for congestion and sore throat.   Respiratory: Negative for cough and shortness of breath.   Cardiovascular: Negative for chest pain and palpitations.  Gastrointestinal: Negative for abdominal pain, nausea and vomiting.  Skin: Negative for itching and rash.         Non-healing wound on upper chest    Physical Exam: Vital Signs BP 119/82 (BP Location: Left Arm, Patient Position: Sitting, Cuff Size: Large)   Pulse (!) 144   Ht 6' (1.829 m)   Wt 265 lb 3.2 oz (120.3 kg)   SpO2 96%   BMI 35.97 kg/m  Physical Exam Vitals and nursing note reviewed.  Constitutional:      General: He is not in acute distress.    Appearance: Normal appearance. He is obese. He is not ill-appearing.  HENT:     Head: Normocephalic and atraumatic.  Eyes:     Extraocular Movements: Extraocular movements intact.  Cardiovascular:     Rate and Rhythm: Normal rate and regular rhythm.  Pulmonary:     Breath sounds: No stridor. No wheezing, rhonchi or rales.  Abdominal:     General: Bowel sounds are normal.     Palpations: Abdomen is soft.  Musculoskeletal:        General: Normal range of motion.     Cervical back: Normal range of motion.  Skin:    General: Skin is warm and dry.  Neurological:     General: No focal deficit present.     Mental Status: He is alert and oriented to person, place, and time.  Psychiatric:        Mood and Affect: Mood normal.        Behavior: Behavior normal.        Thought Content: Thought content normal.        Judgment: Judgment normal.     Pictures were obtained of the patient and placed in the chart with the patient's or guardian's permission.    Assessment/Plan:  Mr. Goines scheduled for excision of right sternoclavicular joint infection and placement of ACell or cellerate with Dr. Ulice Bold.  Risks, benefits, and alternatives of procedure discussed, questions answered and consent obtained.    Smoking Status: Current smoker; Counseling Given? Yes  Caprini Score: 2 Low; Risk Factors include: 30 year old male, BMI > 25, and length of planned surgery. Recommendation for mechanical or pharmacological prophylaxis during surgery. Encourage early ambulation.   Pictures obtained: 03/16/20  Post-op Rx sent to pharmacy: Norco, IBU, Tylenol, Keflex  Patient was provided with the General Surgical Risk consent document and Pain Medication Agreement prior to their appointment.  They had adequate time to read through the risk consent documents and Pain Medication Agreement. We also discussed them in person together during this preop appointment. All of their questions were answered to their satisfaction.  Recommended calling if they have any further questions.  Risk consent form and Pain Medication Agreement to be scanned into patient's chart.  Electronically signed by: Eldridge Abrahams, PA-C 03/16/2020 2:24 PM

## 2020-03-16 ENCOUNTER — Other Ambulatory Visit: Payer: Self-pay

## 2020-03-16 ENCOUNTER — Ambulatory Visit (INDEPENDENT_AMBULATORY_CARE_PROVIDER_SITE_OTHER): Payer: BC Managed Care – PPO | Admitting: Plastic Surgery

## 2020-03-16 ENCOUNTER — Encounter: Payer: Self-pay | Admitting: Plastic Surgery

## 2020-03-16 VITALS — BP 119/82 | HR 144 | Ht 72.0 in | Wt 265.2 lb

## 2020-03-16 DIAGNOSIS — M009 Pyogenic arthritis, unspecified: Secondary | ICD-10-CM

## 2020-03-16 MED ORDER — CEPHALEXIN 500 MG PO CAPS: 500.0000 mg | ORAL_CAPSULE | Freq: Four times a day (QID) | ORAL | 0 refills | Status: AC

## 2020-03-16 MED ORDER — ACETAMINOPHEN 500 MG PO TABS: 500.0000 mg | ORAL_TABLET | Freq: Four times a day (QID) | ORAL | 0 refills | Status: DC | PRN

## 2020-03-16 MED ORDER — HYDROCODONE-ACETAMINOPHEN 5-325 MG PO TABS: 1.0000 | ORAL_TABLET | Freq: Three times a day (TID) | ORAL | 0 refills | Status: AC | PRN

## 2020-03-16 MED ORDER — IBUPROFEN 600 MG PO TABS: 600.0000 mg | ORAL_TABLET | Freq: Four times a day (QID) | ORAL | 0 refills | Status: DC | PRN

## 2020-03-19 ENCOUNTER — Encounter: Payer: BC Managed Care – PPO | Admitting: Plastic Surgery

## 2020-03-25 ENCOUNTER — Encounter (HOSPITAL_BASED_OUTPATIENT_CLINIC_OR_DEPARTMENT_OTHER): Payer: Self-pay | Admitting: Plastic Surgery

## 2020-03-25 ENCOUNTER — Other Ambulatory Visit: Payer: Self-pay

## 2020-03-29 ENCOUNTER — Other Ambulatory Visit (HOSPITAL_COMMUNITY)
Admission: RE | Admit: 2020-03-29 | Discharge: 2020-03-29 | Disposition: A | Discharge status: Home / Self Care | Payer: BC Managed Care – PPO | Source: Ambulatory Visit | Attending: Plastic Surgery | Admitting: Plastic Surgery

## 2020-03-29 DIAGNOSIS — U071 COVID-19: Secondary | ICD-10-CM | POA: Insufficient documentation

## 2020-03-29 DIAGNOSIS — Z01812 Encounter for preprocedural laboratory examination: Secondary | ICD-10-CM | POA: Insufficient documentation

## 2020-03-29 DIAGNOSIS — Z8616 Personal history of COVID-19: Secondary | ICD-10-CM

## 2020-03-29 HISTORY — DX: Personal history of COVID-19: Z86.16

## 2020-03-30 LAB — SARS CORONAVIRUS 2 (TAT 6-24 HRS): SARS Coronavirus 2: POSITIVE — AB

## 2020-03-30 NOTE — Progress Notes (Signed)
Called Shayna at Dr. Kittie Plater office to inform of patient's positive covid test result. Advised that patient may be rescheduled at [facility] after 10 days if symptom free and appropriate to wait, or moved to [facility] main OR if urgent. Also advised that case would be dropped to the depot for disposition.

## 2020-03-31 ENCOUNTER — Telehealth: Payer: Self-pay | Admitting: Oncology

## 2020-03-31 NOTE — Telephone Encounter (Signed)
Called to discuss with patient about COVID-19 symptoms and the use of one of the available treatments for those with mild to moderate Covid symptoms and at a high risk of hospitalization.  Pt appears to qualify for outpatient treatment due to co-morbid conditions and/or a member of an at-risk group in accordance with the FDA Emergency Use Authorization.    Symptom onset: No symptoms Vaccinated: Yes Booster? No Immunocompromised? Yes Qualifiers:  Past Medical History:  Diagnosis Date  . Anemia   . Arthritis    rheumatoid  . Asthma   . Bipolar disorder (HCC)   . Depression   . GERD (gastroesophageal reflux disease)   . Hypertension 2019  . Ischemic colitis (HCC) 09/2008  . MSSA (methicillin susceptible Staphylococcus aureus)    sternaoclavicular absecess drainage  . Osteoarthritis    sterum and right shoulder  . PTSD (post-traumatic stress disorder)   . Tachycardia    Spoke with patients mother who states she thinks Covid testing was a false positive. He is scheduled for surgery and was tested prior to his procedure. She will let us know if he develops symptoms.   Mauro Kaufmann

## 2020-04-02 ENCOUNTER — Encounter: Payer: BC Managed Care – PPO | Admitting: Plastic Surgery

## 2020-04-02 ENCOUNTER — Telehealth: Payer: Self-pay

## 2020-04-02 ENCOUNTER — Encounter (HOSPITAL_BASED_OUTPATIENT_CLINIC_OR_DEPARTMENT_OTHER): Payer: Self-pay | Admitting: Plastic Surgery

## 2020-04-02 NOTE — Telephone Encounter (Signed)
Received fax that pt needs refill on sertraline 100mg    sertraline (ZOLOFT) 100 MG tablet Medication Date: 12/23/2019 Department: [facility] Ordering/Authorizing: [facility], MD    Order Providers  Prescribing Provider Encounter Provider  Neysa Hotter, MD Neysa Hotter, MD   Outpatient Medication Detail   Disp Refills Start End   sertraline (ZOLOFT) 100 MG tablet 135 tablet 0 01/03/2020    Sig - Route: Take 1.5 tablets (150 mg total) by mouth daily. - Oral   Sent to pharmacy as: sertraline (ZOLOFT) 100 MG tablet   E-Prescribing Status: Receipt confirmed by pharmacy (12/23/2019 11:10 AM EST)    Pharmacy  [id number] - [place], [place] - [address]

## 2020-04-05 ENCOUNTER — Other Ambulatory Visit: Payer: Self-pay | Admitting: Psychiatry

## 2020-04-05 MED ORDER — SERTRALINE HCL 100 MG PO TABS: 150.0000 mg | ORAL_TABLET | Freq: Every day | ORAL | 0 refills | Status: DC

## 2020-04-05 NOTE — Telephone Encounter (Signed)
Ordered

## 2020-04-06 ENCOUNTER — Encounter (HOSPITAL_BASED_OUTPATIENT_CLINIC_OR_DEPARTMENT_OTHER): Payer: Self-pay | Admitting: Plastic Surgery

## 2020-04-06 ENCOUNTER — Other Ambulatory Visit: Payer: Self-pay

## 2020-04-09 ENCOUNTER — Encounter: Payer: BC Managed Care – PPO | Admitting: Plastic Surgery

## 2020-04-12 ENCOUNTER — Ambulatory Visit (HOSPITAL_BASED_OUTPATIENT_CLINIC_OR_DEPARTMENT_OTHER)
Admission: RE | Admit: 2020-04-12 | Discharge: 2020-04-12 | Disposition: A | Discharge status: Home / Self Care | Payer: BC Managed Care – PPO | Attending: Plastic Surgery | Admitting: Plastic Surgery

## 2020-04-12 ENCOUNTER — Ambulatory Visit (HOSPITAL_BASED_OUTPATIENT_CLINIC_OR_DEPARTMENT_OTHER): Payer: BC Managed Care – PPO | Admitting: Certified Registered"

## 2020-04-12 ENCOUNTER — Encounter (HOSPITAL_BASED_OUTPATIENT_CLINIC_OR_DEPARTMENT_OTHER): Payer: Self-pay | Admitting: Plastic Surgery

## 2020-04-12 ENCOUNTER — Other Ambulatory Visit: Payer: Self-pay

## 2020-04-12 ENCOUNTER — Encounter (HOSPITAL_BASED_OUTPATIENT_CLINIC_OR_DEPARTMENT_OTHER)
Admission: RE | Disposition: A | Discharge status: Home / Self Care | Payer: Self-pay | Source: Home / Self Care | Attending: Plastic Surgery

## 2020-04-12 DIAGNOSIS — Z79899 Other long term (current) drug therapy: Secondary | ICD-10-CM | POA: Diagnosis not present

## 2020-04-12 DIAGNOSIS — Z8619 Personal history of other infectious and parasitic diseases: Secondary | ICD-10-CM | POA: Insufficient documentation

## 2020-04-12 DIAGNOSIS — M01X19 Direct infection of unspecified shoulder in infectious and parasitic diseases classified elsewhere: Secondary | ICD-10-CM | POA: Diagnosis not present

## 2020-04-12 DIAGNOSIS — F17210 Nicotine dependence, cigarettes, uncomplicated: Secondary | ICD-10-CM | POA: Insufficient documentation

## 2020-04-12 DIAGNOSIS — I10 Essential (primary) hypertension: Secondary | ICD-10-CM | POA: Diagnosis not present

## 2020-04-12 DIAGNOSIS — M069 Rheumatoid arthritis, unspecified: Secondary | ICD-10-CM | POA: Insufficient documentation

## 2020-04-12 DIAGNOSIS — M009 Pyogenic arthritis, unspecified: Secondary | ICD-10-CM | POA: Insufficient documentation

## 2020-04-12 HISTORY — PX: INCISION AND DRAINAGE OF WOUND: SHX1803

## 2020-04-12 SURGERY — IRRIGATION AND DEBRIDEMENT WOUND
Anesthesia: General | Site: Chest | Laterality: Right

## 2020-04-12 MED ORDER — ACETAMINOPHEN 325 MG RE SUPP: 650.0000 mg | RECTAL | Status: DC | PRN

## 2020-04-12 MED ORDER — DEXTROSE 5 % IV SOLN
3.0000 g | INTRAVENOUS | Status: DC
  Filled 2020-04-12: qty 3000

## 2020-04-12 MED ORDER — METHYLENE BLUE 0.5 % INJ SOLN
INTRAVENOUS | Status: DC | PRN
  Administered 2020-04-12: 1 mL via INTRADERMAL

## 2020-04-12 MED ORDER — ONDANSETRON HCL 4 MG/2ML IJ SOLN
INTRAMUSCULAR | Status: AC
  Filled 2020-04-12: qty 2

## 2020-04-12 MED ORDER — SODIUM CHLORIDE 0.9% FLUSH: 3.0000 mL | Freq: Two times a day (BID) | INTRAVENOUS | Status: DC

## 2020-04-12 MED ORDER — OXYCODONE HCL 5 MG PO TABS: 5.0000 mg | ORAL_TABLET | ORAL | Status: DC | PRN

## 2020-04-12 MED ORDER — DEXMEDETOMIDINE (PRECEDEX) IN NS 20 MCG/5ML (4 MCG/ML) IV SYRINGE
PREFILLED_SYRINGE | INTRAVENOUS | Status: DC | PRN
  Administered 2020-04-12: 12 ug via INTRAVENOUS

## 2020-04-12 MED ORDER — ONDANSETRON HCL 4 MG/2ML IJ SOLN
INTRAMUSCULAR | Status: DC | PRN
  Administered 2020-04-12: 4 mg via INTRAVENOUS

## 2020-04-12 MED ORDER — PROPOFOL 10 MG/ML IV BOLUS
INTRAVENOUS | Status: DC | PRN
  Administered 2020-04-12: 200 mg via INTRAVENOUS

## 2020-04-12 MED ORDER — FENTANYL CITRATE (PF) 100 MCG/2ML IJ SOLN
INTRAMUSCULAR | Status: AC
  Filled 2020-04-12: qty 2

## 2020-04-12 MED ORDER — SODIUM CHLORIDE (PF) 0.9 % IJ SOLN
INTRAMUSCULAR | Status: AC
  Filled 2020-04-12: qty 10

## 2020-04-12 MED ORDER — MIDAZOLAM HCL 2 MG/2ML IJ SOLN
INTRAMUSCULAR | Status: AC
  Filled 2020-04-12: qty 2

## 2020-04-12 MED ORDER — ONDANSETRON HCL 4 MG/2ML IJ SOLN: 4.0000 mg | Freq: Once | INTRAMUSCULAR | Status: DC | PRN

## 2020-04-12 MED ORDER — OXYCODONE HCL 5 MG/5ML PO SOLN: 5.0000 mg | Freq: Once | ORAL | Status: DC | PRN

## 2020-04-12 MED ORDER — METHYLENE BLUE 0.5 % INJ SOLN
INTRAVENOUS | Status: AC
  Filled 2020-04-12: qty 10

## 2020-04-12 MED ORDER — PHENYLEPHRINE HCL (PRESSORS) 10 MG/ML IV SOLN
INTRAVENOUS | Status: DC | PRN
  Administered 2020-04-12: 80 ug via INTRAVENOUS

## 2020-04-12 MED ORDER — FENTANYL CITRATE (PF) 100 MCG/2ML IJ SOLN
INTRAMUSCULAR | Status: DC | PRN
  Administered 2020-04-12: 100 ug via INTRAVENOUS

## 2020-04-12 MED ORDER — SODIUM CHLORIDE 0.9 % IV SOLN: 250.0000 mL | INTRAVENOUS | Status: DC | PRN

## 2020-04-12 MED ORDER — FENTANYL CITRATE (PF) 100 MCG/2ML IJ SOLN
25.0000 ug | INTRAMUSCULAR | Status: DC | PRN
  Administered 2020-04-12: 25 ug via INTRAVENOUS

## 2020-04-12 MED ORDER — PHENYLEPHRINE 40 MCG/ML (10ML) SYRINGE FOR IV PUSH (FOR BLOOD PRESSURE SUPPORT)
PREFILLED_SYRINGE | INTRAVENOUS | Status: AC
  Filled 2020-04-12: qty 10

## 2020-04-12 MED ORDER — CHLORHEXIDINE GLUCONATE CLOTH 2 % EX PADS: 6.0000 | MEDICATED_PAD | Freq: Once | CUTANEOUS | Status: DC

## 2020-04-12 MED ORDER — CEFAZOLIN SODIUM-DEXTROSE 2-4 GM/100ML-% IV SOLN
2.0000 g | INTRAVENOUS | Status: AC
  Administered 2020-04-12: 2 g via INTRAVENOUS

## 2020-04-12 MED ORDER — FENTANYL CITRATE (PF) 100 MCG/2ML IJ SOLN: 25.0000 ug | INTRAMUSCULAR | Status: DC | PRN

## 2020-04-12 MED ORDER — SODIUM CHLORIDE 0.9% FLUSH: 3.0000 mL | INTRAVENOUS | Status: DC | PRN

## 2020-04-12 MED ORDER — CEFAZOLIN SODIUM-DEXTROSE 2-4 GM/100ML-% IV SOLN
INTRAVENOUS | Status: AC
  Filled 2020-04-12: qty 100

## 2020-04-12 MED ORDER — LIDOCAINE-EPINEPHRINE 1 %-1:100000 IJ SOLN
INTRAMUSCULAR | Status: DC | PRN
  Administered 2020-04-12: 12 mL

## 2020-04-12 MED ORDER — LIDOCAINE HCL (CARDIAC) PF 100 MG/5ML IV SOSY
PREFILLED_SYRINGE | INTRAVENOUS | Status: DC | PRN
  Administered 2020-04-12: 100 mg via INTRAVENOUS

## 2020-04-12 MED ORDER — DEXAMETHASONE SODIUM PHOSPHATE 10 MG/ML IJ SOLN
INTRAMUSCULAR | Status: DC | PRN
  Administered 2020-04-12: 5 mg via INTRAVENOUS

## 2020-04-12 MED ORDER — LACTATED RINGERS IV SOLN: INTRAVENOUS | Status: DC

## 2020-04-12 MED ORDER — OXYCODONE HCL 5 MG PO TABS: 5.0000 mg | ORAL_TABLET | Freq: Once | ORAL | Status: DC | PRN

## 2020-04-12 MED ORDER — MIDAZOLAM HCL 5 MG/5ML IJ SOLN
INTRAMUSCULAR | Status: DC | PRN
  Administered 2020-04-12: 2 mg via INTRAVENOUS

## 2020-04-12 MED ORDER — ACETAMINOPHEN 325 MG PO TABS: 650.0000 mg | ORAL_TABLET | ORAL | Status: DC | PRN

## 2020-04-12 MED ORDER — LIDOCAINE HCL (CARDIAC) PF 100 MG/5ML IV SOSY: PREFILLED_SYRINGE | INTRAVENOUS | Status: DC | PRN

## 2020-04-12 SURGICAL SUPPLY — 86 items
ADH SKN CLS APL DERMABOND .7 (GAUZE/BANDAGES/DRESSINGS)
AGENT HMST PWDR BTL CLGN 5GM (Miscellaneous) ×1 IMPLANT
APL SKNCLS STERI-STRIP NONHPOA (GAUZE/BANDAGES/DRESSINGS)
BAG DECANTER FOR FLEXI CONT (MISCELLANEOUS) ×2 IMPLANT
BENZOIN TINCTURE PRP APPL 2/3 (GAUZE/BANDAGES/DRESSINGS) IMPLANT
BLADE HEX COATED 2.75 (ELECTRODE) IMPLANT
BLADE SURG 10 STRL SS (BLADE) ×1 IMPLANT
BLADE SURG 15 STRL LF DISP TIS (BLADE) ×1 IMPLANT
BLADE SURG 15 STRL SS (BLADE) ×2
BNDG COHESIVE 4X5 TAN STRL (GAUZE/BANDAGES/DRESSINGS) IMPLANT
BNDG ELASTIC 3X5.8 VLCR STR LF (GAUZE/BANDAGES/DRESSINGS) IMPLANT
BNDG ELASTIC 4X5.8 VLCR STR LF (GAUZE/BANDAGES/DRESSINGS) IMPLANT
BNDG ELASTIC 6X5.8 VLCR STR LF (GAUZE/BANDAGES/DRESSINGS) IMPLANT
BNDG GAUZE ELAST 4 BULKY (GAUZE/BANDAGES/DRESSINGS) ×1 IMPLANT
CANISTER SUCT 1200ML W/VALVE (MISCELLANEOUS) IMPLANT
COLLAGEN CELLERATERX 5 GRAM (Miscellaneous) ×1 IMPLANT
COVER BACK TABLE 60X90IN (DRAPES) ×2 IMPLANT
COVER MAYO STAND STRL (DRAPES) ×2 IMPLANT
COVER WAND RF STERILE (DRAPES) IMPLANT
DECANTER SPIKE VIAL GLASS SM (MISCELLANEOUS) IMPLANT
DERMABOND ADVANCED (GAUZE/BANDAGES/DRESSINGS)
DERMABOND ADVANCED .7 DNX12 (GAUZE/BANDAGES/DRESSINGS) IMPLANT
DRAIN CHANNEL 19F RND (DRAIN) IMPLANT
DRAIN PENROSE 1/2X12 LTX STRL (WOUND CARE) IMPLANT
DRAPE INCISE IOBAN 66X45 STRL (DRAPES) IMPLANT
DRAPE LAPAROSCOPIC ABDOMINAL (DRAPES) IMPLANT
DRAPE LAPAROTOMY 100X72 PEDS (DRAPES) IMPLANT
DRAPE U-SHAPE 76X120 STRL (DRAPES) ×2 IMPLANT
DRSG ADAPTIC 3X8 NADH LF (GAUZE/BANDAGES/DRESSINGS) IMPLANT
DRSG CUTIMED SORBACT 7X9 (GAUZE/BANDAGES/DRESSINGS) ×1 IMPLANT
DRSG EMULSION OIL 3X3 NADH (GAUZE/BANDAGES/DRESSINGS) IMPLANT
DRSG HYDROCOLLOID 4X4 (GAUZE/BANDAGES/DRESSINGS) IMPLANT
DRSG MEPILEX BORDER 4X4 (GAUZE/BANDAGES/DRESSINGS) ×1 IMPLANT
DRSG PAD ABDOMINAL 8X10 ST (GAUZE/BANDAGES/DRESSINGS) IMPLANT
ELECT BLADE 4.0 EZ CLEAN MEGAD (MISCELLANEOUS) ×2
ELECT REM PT RETURN 9FT ADLT (ELECTROSURGICAL) ×2
ELECTRODE BLDE 4.0 EZ CLN MEGD (MISCELLANEOUS) IMPLANT
ELECTRODE REM PT RTRN 9FT ADLT (ELECTROSURGICAL) ×1 IMPLANT
EVACUATOR SILICONE 100CC (DRAIN) IMPLANT
GAUZE SPONGE 4X4 12PLY STRL (GAUZE/BANDAGES/DRESSINGS) ×2 IMPLANT
GAUZE SPONGE 4X4 12PLY STRL LF (GAUZE/BANDAGES/DRESSINGS) ×1 IMPLANT
GAUZE XEROFORM 1X8 LF (GAUZE/BANDAGES/DRESSINGS) IMPLANT
GAUZE XEROFORM 5X9 LF (GAUZE/BANDAGES/DRESSINGS) IMPLANT
GLOVE SURG ENC MOIS LTX SZ6.5 (GLOVE) ×7 IMPLANT
GOWN STRL REUS W/ TWL LRG LVL3 (GOWN DISPOSABLE) ×2 IMPLANT
GOWN STRL REUS W/TWL LRG LVL3 (GOWN DISPOSABLE) ×4
IV NS IRRIG 3000ML ARTHROMATIC (IV SOLUTION) IMPLANT
MANIFOLD NEPTUNE II (INSTRUMENTS) IMPLANT
NDL HYPO 25X1 1.5 SAFETY (NEEDLE) IMPLANT
NEEDLE HYPO 25X1 1.5 SAFETY (NEEDLE) ×2 IMPLANT
NS IRRIG 1000ML POUR BTL (IV SOLUTION) ×2 IMPLANT
PACK BASIN DAY SURGERY FS (CUSTOM PROCEDURE TRAY) ×2 IMPLANT
PADDING CAST ABS 3INX4YD NS (CAST SUPPLIES)
PADDING CAST ABS 4INX4YD NS (CAST SUPPLIES)
PADDING CAST ABS COTTON 3X4 (CAST SUPPLIES) IMPLANT
PADDING CAST ABS COTTON 4X4 ST (CAST SUPPLIES) IMPLANT
PENCIL SMOKE EVACUATOR (MISCELLANEOUS) ×2 IMPLANT
PIN SAFETY STERILE (MISCELLANEOUS) IMPLANT
SHEET MEDIUM DRAPE 40X70 STRL (DRAPES) ×2 IMPLANT
SLEEVE SCD COMPRESS KNEE MED (STOCKING) IMPLANT
SPLINT FIBERGLASS 3X35 (CAST SUPPLIES) ×2 IMPLANT
SPLINT FIBERGLASS 4X30 (CAST SUPPLIES) IMPLANT
SPONGE LAP 18X18 RF (DISPOSABLE) ×2 IMPLANT
STAPLER VISISTAT 35W (STAPLE) IMPLANT
STOCKINETTE IMPERVIOUS LG (DRAPES) IMPLANT
STRIP CLOSURE SKIN 1/2X4 (GAUZE/BANDAGES/DRESSINGS) IMPLANT
SUCTION FRAZIER HANDLE 10FR (MISCELLANEOUS)
SUCTION TUBE FRAZIER 10FR DISP (MISCELLANEOUS) IMPLANT
SURGILUBE 2OZ TUBE FLIPTOP (MISCELLANEOUS) IMPLANT
SUT MNCRL AB 4-0 PS2 18 (SUTURE) ×2 IMPLANT
SUT MON AB 3-0 SH 27 (SUTURE)
SUT MON AB 3-0 SH27 (SUTURE) IMPLANT
SUT SILK 3 0 PS 1 (SUTURE) IMPLANT
SUT VIC AB 3-0 FS2 27 (SUTURE) IMPLANT
SUT VIC AB 5-0 PS2 18 (SUTURE) ×2 IMPLANT
SUT VICRYL 4-0 PS2 18IN ABS (SUTURE) IMPLANT
SWAB COLLECTION DEVICE MRSA (MISCELLANEOUS) IMPLANT
SWAB CULTURE ESWAB REG 1ML (MISCELLANEOUS) IMPLANT
SYR BULB IRRIG 60ML STRL (SYRINGE) ×2 IMPLANT
SYR CONTROL 10ML LL (SYRINGE) IMPLANT
TAPE HYPAFIX 6X30 (GAUZE/BANDAGES/DRESSINGS) IMPLANT
TOWEL GREEN STERILE FF (TOWEL DISPOSABLE) ×2 IMPLANT
TRAY DSU PREP LF (CUSTOM PROCEDURE TRAY) ×2 IMPLANT
TUBE CONNECTING 20X1/4 (TUBING) ×2 IMPLANT
UNDERPAD 30X36 HEAVY ABSORB (UNDERPADS AND DIAPERS) ×2 IMPLANT
YANKAUER SUCT BULB TIP NO VENT (SUCTIONS) ×2 IMPLANT

## 2020-04-12 NOTE — Anesthesia Procedure Notes (Signed)
Procedure Name: LMA Insertion Date/Time: 04/12/2020 10:24 AM Performed by: Thornell Mule, CRNA Pre-anesthesia Checklist: Patient identified, Emergency Drugs available, Suction available and Patient being monitored Patient Re-evaluated:Patient Re-evaluated prior to induction Oxygen Delivery Method: Circle system utilized Preoxygenation: Pre-oxygenation with 100% oxygen Induction Type: IV induction LMA: LMA inserted LMA Size: 4.0 Number of attempts: 1 Placement Confirmation: positive ETCO2 Tube secured with: Tape Dental Injury: Teeth and Oropharynx as per pre-operative assessment

## 2020-04-12 NOTE — Op Note (Signed)
DATE OF OPERATION: 04/12/2020  LOCATION: [facility]  PREOPERATIVE DIAGNOSIS: Right sternoclavicular joint infection  POSTOPERATIVE DIAGNOSIS: Same  PROCEDURE: Excision of right sternoclavicular joint infection /wound 2 x 4 x 4 cm, placement of Cellerate 5 g powder and gel  SURGEON: Matasha Smigelski Sanger Nachum Derossett, DO  ASSISTANT: none  EBL: 5 cc  CONDITION: Stable  COMPLICATIONS: None  INDICATION: The patient, Herbert Walsh, is a 30 y.o. male born on [birth year], is here for treatment of a chronic right sternoclavicular joint infection.   PROCEDURE DETAILS:  The patient was seen prior to surgery and marked.  The IV antibiotics were given. The patient was taken to the operating room and given a general anesthetic. A standard time out was performed and all information was confirmed by those in the room. SCDs were placed.   The patient was prepped and draped.  Lidocaine with epinephrine was injected at right sternoclavicular joint soft tissue area.  He had a scar there already.  The area was marked around a very small opening.  Methylene blue was injected in the opening order to follow the tract to the base.  The 15 blade and Bovie were used to excise the track down to the base 2 x 4 x 4 cm.  Cultures were obtained.  These were for Gram stain culture and sensitivity.  The wound was irrigated with saline. There was some purulence noted at the base.  Hemostasis was achieved with electrocautery.  Cellerate powder (5 gm) was placed followed by a sore back that was sutured into place with 5-0 Vicryl.  Cellerate gel was applied to gauze and then placed in the effects.  Sterile dressing was applied.   The patient was allowed to wake up and taken to recovery room in stable condition at the end of the case. The family was notified at the end of the case.

## 2020-04-12 NOTE — Transfer of Care (Signed)
Immediate Anesthesia Transfer of Care Note  Patient: Herbert Walsh  Procedure(s) Performed: excision of right sternoclavicular joint infection (Right Chest) Placement of Acell or Cellerate (Right Chest)  Patient Location: PACU  Anesthesia Type:General  Level of Consciousness: drowsy, patient cooperative and responds to stimulation  Airway & Oxygen Therapy: Patient Spontanous Breathing and Patient connected to face mask oxygen  Post-op Assessment: Report given to RN and Post -op Vital signs reviewed and stable  Post vital signs: Reviewed and stable  Last Vitals:  Vitals Value Taken Time  BP 111/98 04/12/20 1112  Temp    Pulse 109 04/12/20 1114  Resp 18 04/12/20 1114  SpO2 99 % 04/12/20 1114  Vitals shown include unvalidated device data.  Last Pain:  Vitals:   04/12/20 0824  TempSrc: Oral  PainSc: 6       Patients Stated Pain Goal: 6 (04/12/20 0824)  Complications: No complications documented.

## 2020-04-12 NOTE — Interval H&P Note (Signed)
History and Physical Interval Note:  04/12/2020 9:57 AM  Herbert Walsh  has presented today for surgery, with the diagnosis of Septic arthritis of right sternoclavicular joint.  The various methods of treatment have been discussed with the patient and family. After consideration of risks, benefits and other options for treatment, the patient has consented to  Procedure(s) with comments: excision of right sternoclavicular joint infection (Right) - 45 min total Placement of Acell or Cellerate (Right) as a surgical intervention.  The patient's history has been reviewed, patient examined, no change in status, stable for surgery.  I have reviewed the patient's chart and labs.  Questions were answered to the patient's satisfaction.     Alena Bills Mylo Choi

## 2020-04-12 NOTE — Anesthesia Preprocedure Evaluation (Addendum)
Anesthesia Evaluation  Patient identified by MRN, date of birth, ID band Patient awake    Reviewed: Allergy & Precautions, NPO status , Patient's Chart, lab work & pertinent test results  Airway Mallampati: III  TM Distance: >3 FB Neck ROM: Full    Dental  (+) Poor Dentition, Missing, Chipped,    Pulmonary asthma , Current Smoker and Patient abstained from smoking.,    Pulmonary exam normal breath sounds clear to auscultation       Cardiovascular hypertension, Pt. on medications Normal cardiovascular exam Rhythm:Regular Rate:Normal     Neuro/Psych PSYCHIATRIC DISORDERS Anxiety Depression Bipolar Disorder Hx/o schizophrenia Neuromuscular disease    GI/Hepatic Neg liver ROS, GERD  Medicated and Controlled,  Endo/Other  Obesity Hyperlipidemia  Renal/GU negative Renal ROS  negative genitourinary   Musculoskeletal  (+) Arthritis , Rheumatoid disorders,  Osteomyelitis right sternoclavicular joint   Abdominal (+) + obese,   Peds  Hematology  (+) anemia ,   Anesthesia Other Findings   Reproductive/Obstetrics                             Anesthesia Physical Anesthesia Plan  ASA: III  Anesthesia Plan: General   Post-op Pain Management:    Induction: Intravenous  PONV Risk Score and Plan: 2 and Midazolam, Treatment may vary due to age or medical condition and Ondansetron  Airway Management Planned: LMA  Additional Equipment:   Intra-op Plan:   Post-operative Plan: Extubation in OR  Informed Consent: I have reviewed the patients History and Physical, chart, labs and discussed the procedure including the risks, benefits and alternatives for the proposed anesthesia with the patient or authorized representative who has indicated his/her understanding and acceptance.     Dental advisory given  Plan Discussed with: CRNA and Anesthesiologist  Anesthesia Plan Comments:          Anesthesia Quick Evaluation

## 2020-04-12 NOTE — Anesthesia Postprocedure Evaluation (Signed)
Anesthesia Post Note  Patient: Herbert Walsh  Procedure(s) Performed: excision of right sternoclavicular joint infection (Right Chest)     Patient location during evaluation: PACU Anesthesia Type: General Level of consciousness: awake Pain management: pain level controlled Vital Signs Assessment: post-procedure vital signs reviewed and stable Respiratory status: spontaneous breathing, nonlabored ventilation, respiratory function stable and patient connected to nasal cannula oxygen Cardiovascular status: blood pressure returned to baseline and stable Postop Assessment: no apparent nausea or vomiting Anesthetic complications: no   No complications documented.  Last Vitals:  Vitals:   04/12/20 1200 04/12/20 1230  BP: 109/77 114/76  Pulse: (!) 101 (!) 107  Resp: 14 16  Temp:  36.8 C  SpO2: 95% 97%    Last Pain:  Vitals:   04/12/20 1230  TempSrc:   PainSc: 2                  Wyeth Hoffer P Raidyn Breiner

## 2020-04-12 NOTE — Discharge Instructions (Signed)
Post Anesthesia Home Care Instructions  Activity: Get plenty of rest for the remainder of the day. A responsible individual must stay with you for 24 hours following the procedure.  For the next 24 hours, DO NOT: -Drive a car -Advertising copywriter -Drink alcoholic beverages -Take any medication unless instructed by your physician -Make any legal decisions or sign important papers.  Meals: Start with liquid foods such as gelatin or soup. Progress to regular foods as tolerated. Avoid greasy, spicy, heavy foods. If nausea and/or vomiting occur, drink only clear liquids until the nausea and/or vomiting subsides. Call your physician if vomiting continues.  Special Instructions/Symptoms: Your throat may feel dry or sore from the anesthesia or the breathing tube placed in your throat during surgery. If this causes discomfort, gargle with warm salt water. The discomfort should disappear within 24 hours.  If you had a scopolamine patch placed behind your ear for the management of post- operative nausea and/or vomiting:  1. The medication in the patch is effective for 72 hours, after which it should be removed.  Wrap patch in a tissue and discard in the trash. Wash hands thoroughly with soap and water. 2. You may remove the patch earlier than 72 hours if you experience unpleasant side effects which may include dry mouth, dizziness or visual disturbances. 3. Avoid touching the patch. Wash your hands with soap and water after contact with the patch.    INSTRUCTIONS FOR AFTER SURGERY   You will likely have some questions about what to expect following your operation.  The following information will help you and your family understand what to expect when you are discharged from the hospital.  Following these guidelines will help ensure a smooth recovery and reduce risks of complications.  Postoperative instructions include information on: diet, wound care, medications and physical activity.  AFTER  SURGERY Expect to go home after the procedure.  In some cases, you may need to spend one night in the hospital for observation.  DIET This surgery does not require a specific diet.  However, I have to mention that the healthier you eat the better your body can start healing. It is important to increasing your protein intake.  This means limiting the foods with added sugar.  Focus on fruits and vegetables and some meat. It is very important to drink water after your surgery.  If your urine is bright yellow, then it is concentrated, and you need to drink more water.  As a general rule after surgery, you should have 8 ounces of water every hour while awake.  If you find you are persistently nauseated or unable to take in liquids let us know.  NO TOBACCO USE or EXPOSURE.  This will slow your healing process and increase the risk of a wound.  WOUND CARE Clean with baby wipes for I week.  Don't get the area wet yet.  Shower from waist down.. If you have steri-strips / tape directly attached to your skin leave them in place. It is OK to get these wet.  No baths, pools or hot tubs for two weeks. We close your incision to leave the smallest and best-looking scar. No ointment or creams on your incisions until given the go ahead.  Especially not Neosporin (Too many skin reactions with this one).  A few weeks after surgery you can use Mederma and start massaging the scar.  ACTIVITY No heavy lifting until cleared by the doctor.  It is OK to walk and climb stairs. In  fact, moving your legs is very important to decrease your risk of a blood clot.  It will also help keep you from getting deconditioned.  Every 1 to 2 hours get up and walk for 5 minutes. This will help with a quicker recovery back to normal.  Let pain be your guide so you don't do too much.  NO, you cannot do the spring cleaning and don't plan on taking care of anyone else.  This is your time for TLC.   WORK Everyone returns to work at different times.  As a rough guide, most people take at least 1 - 2 weeks off prior to returning to work. If you need documentation for your job, bring the forms to your postoperative follow up visit.  DRIVING Arrange for someone to bring you home from the hospital.  You may be able to drive a few days after surgery but not while taking any narcotics or valium.  BOWEL MOVEMENTS Constipation can occur after anesthesia and while taking pain medication.  It is important to stay ahead for your comfort.  We recommend taking Milk of Magnesia (2 tablespoons; twice a day) while taking the pain pills.  SEROMA This is fluid your body tried to put in the surgical site.  This is normal but if it creates excessive pain and swelling let us know.  It usually decreases in a few weeks.  MEDICATIONS and PAIN CONTROL At your preoperative visit for you history and physical you were given the following medications: 1. An antibiotic: Start this medication when you get home and take according to the instructions on the bottle. 2. Zofran 4 mg:  This is to treat nausea and vomiting.  You can take this every 6 hours as needed and only if needed. 3. Norco (hydrocodone/acetaminophen) 5/325 mg:  This is only to be used after you have taken the motrin or the tylenol. Every 8 hours as needed. Over the counter Medication to take: 4. Ibuprofen (Motrin) 600 mg:  Take this every 6 hours.  If you have additional pain then take 500 mg of the tylenol.  Only take the Norco after you have tried these two. 5. Miralax or stool softener of choice: Take this according to the bottle if you take the Norco.  WHEN TO CALL Call your surgeon's office if any of the following occur:  Fever 101 degrees F or greater  Excessive bleeding or fluid from the incision site.  Pain that increases over time without aid from the medications  Redness, warmth, or pus draining from incision sites  Persistent nausea or inability to take in liquids  Severe misshapen  area that underwent the operation.   Wound Care   Guide to Wound Care  Proper wound care may reduce the risk of infection, improve healing rates, and limit scarring.  This is a general guide to help care for and manage wounds treated with Cellerate.   Dressing Changes The frequency of dressing changes can vary based on which product was applied, the size of the wound, or the amount of wound drainage. Dressing inspections are recommended, at least weekly.   Place KY gel on the wound daily and cover with gauze.  Dressing Types  1. Wash Hands - To help decrease the risk of infection, caregivers should wash their hands for a minimum of 20 seconds and may use medical gloves.   2. Remove the Dressings - Avoid removing product from the wound by carefully removing the applicable dressing(s) at the time  points recommended above, or as recommended by the treating physician.  Expected Color and Odor:  It is entirely normal for the wound to have an unpleasant odor and to form a caramel-colored gel as the product absorbs into the wound. It is  important to leave this gel on the wound site.  3. Clean the Wound - Use clean water or saline to gently rinse around the wound surface and remove any excess discharge that may be present on the wound. Do not wipe off any of the caramel-colored gel on the wound.   What to look out for:  Large or increased amount of drainage   Surrounding skin has worsening redness or hot to touch   Increased pain in or around the wound   Flu-like symptoms, fatigue, decreased appetite, fever   Hard, crusty wound surface with black or brown coloring  4. Apply New Dressings - Dressings should cover the entire wound and be suitable for maintaining a moist wound environment.  The non-adherent mesh dressing should be left in place.  New dressing should consist of KY Jelly to keep the wound moist and soft gauze secured with a wrap or tape.   Maintain a Hydrated Wound Area It  is important to keep the wound area moist throughout the healing process. If the wound appears to be dry during dressing changes, select a dressing that will hydrate the wound and maintain that ideal moist environment. If you are unsure what to do, ask the treating physician.  Remodeling Process Every patient heals differently, and no two cases are the same. The size and location of the wound, product type and layering configurations, and general patient health all contribute to how quickly a wound will heal.  While many factors can influence the rate at which the product absorbs, the following can be used as a general guide.   THINGS TO DO: Refrain from smoking High protein diet with plenty of vegetables and some fruit  Limit simple processed carbohydrates and sugar Protect the wound from trauma Protect the dressing  Cellerate            Sorbact dressing

## 2020-04-14 ENCOUNTER — Telehealth: Payer: Self-pay | Admitting: Plastic Surgery

## 2020-04-14 ENCOUNTER — Telehealth: Payer: Self-pay

## 2020-04-14 ENCOUNTER — Encounter (HOSPITAL_BASED_OUTPATIENT_CLINIC_OR_DEPARTMENT_OTHER): Payer: Self-pay | Admitting: Plastic Surgery

## 2020-04-14 MED ORDER — SULFAMETHOXAZOLE-TRIMETHOPRIM 800-160 MG PO TABS: 1.0000 | ORAL_TABLET | Freq: Two times a day (BID) | ORAL | 0 refills | Status: AC

## 2020-04-14 NOTE — Telephone Encounter (Signed)
Patient's mom, Herbert Walsh, called to say that Herbert Walsh had called in Keflex before his surgery and then yesterday Dr. Ulice Bold called in Bactrim. She wanted to know why there was a change in the meds. She doesn't want him to take more than he should. Please call to confirm which antibiotic he should take. #[phone]

## 2020-04-14 NOTE — Telephone Encounter (Signed)
Call to pt- no answer- left v/m to return call.

## 2020-04-15 NOTE — Telephone Encounter (Addendum)
Called and spoke with the patient's mother Alvis Lemmings) on (04/14/20) regarding the message below.  She wanted to know why there was another antibiotic called in for the patient.    Asked the mother how long has the patient been on the first antibiotic (Keflex).  She stated that the patient has been on the (Keflex) since Monday.     Called the patient's mother back on today and informed her that I spoke with [facility] and he stated that a wound culture was done during the surgery, and the results showed the antibiotic needed to be changed.    Asked the mother how was the patient doing.  She stated that he is doing fine.    Asked the mother how long does the patient have on the Keflex, and she stated he took the last Keflex today.     Informed the mother that the patient can start the Bactrim on tomorrow.  Patient's mother verbalized understanding and agreed.//AB/CMA

## 2020-04-16 ENCOUNTER — Encounter: Payer: BC Managed Care – PPO | Admitting: Surgical

## 2020-04-16 ENCOUNTER — Telehealth: Payer: Self-pay | Admitting: Psychiatry
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
## 2020-04-16 NOTE — Telephone Encounter (Signed)
Called his mother, Alvis Lemmings as she requested to speak with this provider. She states that she is concerned about Shahin due to his depression.  Although she denies any concern of self-harm, he is in a lot of pain due to having surgery.  He will have another surgery soon.  He lies in the bed most of the time, although he has good days at times.  She also wonders if he can get letter for disability.  He has a letter from his PCP to support this.  She also thinks he needs to be seen by a therapist, although he may not talk during the session.   Discussed with the mother that this writer acknowledges his depression, and will continue to work on treatment.  Disability letter will be provided after it is discussed with Jonny Ruiz at the next visit.  Although referral to therapy was discussed at least a few times in the past, he has not made an appointment.  This will be discussed again at the next encounter.  Dawn is provided emergency resources if needed.  She agrees with the plans above.

## 2020-04-17 LAB — AEROBIC/ANAEROBIC CULTURE W GRAM STAIN (SURGICAL/DEEP WOUND)

## 2020-04-21 ENCOUNTER — Telehealth: Payer: Self-pay

## 2020-04-21 NOTE — Telephone Encounter (Signed)
Patient's mother called. She states they were told they would receive dressings and supplies for at home wound care after the surgery on 04/12/20 but they have not heard from anyone.

## 2020-04-22 NOTE — Telephone Encounter (Signed)
Returned mothers call. Advised her until his follow up appointment on 04/26/20, patient  can apply K-Y jelly daily to the green mesh, cover this with 4 x 4 gauze and secure the gauze in place with tape.  They can purchase the supplies from the pharmacy. Will discuss wound care order/dressing supplies at their follow-up on 04/26/2020. Patient has cancelled 04/16/20 and 04/23/20.

## 2020-04-22 NOTE — Telephone Encounter (Signed)
Please inform the patient that they can apply K-Y jelly daily to the green mesh, cover this with 4 x 4 gauze and secure the gauze in place with tape.  They should purchase the supplies from the pharmacy.  We can discuss wound care order/dressing supplies at their follow-up on 04/26/2020.  They can purchase the K-Y jelly at the pharmacy as well. It should be plain K-Y jelly

## 2020-04-23 ENCOUNTER — Encounter: Payer: BC Managed Care – PPO | Admitting: Surgical

## 2020-04-26 ENCOUNTER — Telehealth: Payer: Self-pay

## 2020-04-26 ENCOUNTER — Other Ambulatory Visit: Payer: Self-pay

## 2020-04-26 ENCOUNTER — Encounter: Payer: Self-pay | Admitting: Surgical

## 2020-04-26 ENCOUNTER — Ambulatory Visit (INDEPENDENT_AMBULATORY_CARE_PROVIDER_SITE_OTHER): Payer: BC Managed Care – PPO | Admitting: Surgical

## 2020-04-26 DIAGNOSIS — M009 Pyogenic arthritis, unspecified: Secondary | ICD-10-CM | POA: Diagnosis not present

## 2020-04-26 DIAGNOSIS — M069 Rheumatoid arthritis, unspecified: Secondary | ICD-10-CM

## 2020-04-26 NOTE — Telephone Encounter (Signed)
Faxed order to [facility]: 2x2 gauze, Antimicrobial guaze sponge, 4x4 mepilex dressing-Change daily

## 2020-04-26 NOTE — Progress Notes (Signed)
   Referring Provider Herbert Specking, MD [address] [place],  [place] [address]   CC:  Chief Complaint  Patient presents with  . Post-op Follow-up     Herbert Walsh is an 30 y.o. male.  HPI: Patient is a 30 year old male here for follow-up after excision of right sternoclavicular joint infection and placement of collagen powder and gel on 04/12/2020 with Dr. Ulice Walsh.  Patient has a past medical history of MSSA infection, history of multiple septic arthritic events, history of multiple abscesses, rheumatoid arthritis, asthma, bipolar disorder, depression, hypertension, GERD, PTSD.  He also has a history of osteomyelitis.  Cultures intraoperatively showed staph aureus with susceptibilities to doxycycline, Bactrim, ciprofloxacin, etc.  Patient presents today with his mother, he reports overall he is doing well.  They report they have been trying to change the dressing daily, however sometimes they have been going 2 to 3 days without changing.  He denies any fevers, chills, nausea, vomiting, chest pain, shortness of breath.  He reports some pain with palpation.  Patient reports that he has been using a skin barrier and then covering the area with a Tegaderm for the top dressing  Review of Systems General: No fever, chills, nausea, vomiting CV: No chest pain, no shortness of breath  Physical Exam Vitals with BMI 04/12/2020 04/12/2020 04/12/2020  Height - - -  Weight - - -  BMI - - -  Systolic 114 109 233  Diastolic 76 77 75  Pulse 107 101 100  Some encounter information is confidential and restricted. Go to Review Flowsheets activity to see all data.    General:  No acute distress,  Alert and oriented, Non-Toxic, Normal speech and affect Chest: 1 x 1.5 x 0.1 cm wound over the right sternum.  There is some surrounding skin irritation, but no erythema or cellulitic changes noted.  Some tenderness noted.  No crepitus or fluctuance noted with palpation.  No drainage noted.  No tunneling is  appreciated.  The majority of the wound bed is covered with a good base of granulation tissue.  No foul odor is noted.  No purulence noted.      Assessment/Plan  Patient with a right chest wall wound, patient has a history of a chronic wound in this area, previously managed by cardiothoracic surgery for septic arthritis of the right sternoclavicular joint.  He has a history of rheumatoid arthritis.  He also has a history of MSSA infection and history of osteomyelitis.  He reports overall he is doing well, has some tenderness to the area.  He is not having any infectious symptoms.  I recommend daily dressing changes with Adaptic, K-Y jelly, 2 x 2 gauze and Mepilex border dressing.  I discussed with the patient and his mother ideally I would like to see them back in 2 weeks for reevaluation, however due to transportation issues they would not be able to make it until 3 weeks.  I discussed with them to call if anything changes.  At this time I do not see any signs of infection, everything appears to be healing well.  Pictures were obtained of the patient and placed in the chart with the patient's or guardian's permission.   Herbert Walsh 04/26/2020, 9:33 AM

## 2020-04-30 ENCOUNTER — Encounter: Payer: BC Managed Care – PPO | Admitting: Plastic Surgery

## 2020-04-30 ENCOUNTER — Telehealth (INDEPENDENT_AMBULATORY_CARE_PROVIDER_SITE_OTHER): Payer: BC Managed Care – PPO | Admitting: Psychiatry

## 2020-04-30 ENCOUNTER — Encounter: Payer: Self-pay | Admitting: Psychiatry

## 2020-04-30 ENCOUNTER — Other Ambulatory Visit: Payer: Self-pay

## 2020-04-30 DIAGNOSIS — F4310 Post-traumatic stress disorder, unspecified: Secondary | ICD-10-CM | POA: Diagnosis not present

## 2020-04-30 DIAGNOSIS — G4700 Insomnia, unspecified: Secondary | ICD-10-CM

## 2020-04-30 DIAGNOSIS — F6381 Intermittent explosive disorder: Secondary | ICD-10-CM

## 2020-04-30 DIAGNOSIS — F330 Major depressive disorder, recurrent, mild: Secondary | ICD-10-CM

## 2020-04-30 NOTE — Patient Instructions (Signed)
1.Continuesertraline 150 mg daily 2.Continuerisperidone4mg  at night 3.Continuelithium750mg  at night 4.Continueclonidine 0.2 mg three times a day 6.ContinueTrazodone150mg  daily  7.Next appointment: 5/16 at 11 AM  CONTACT INFORMATION  What to do if you need to get in touch with someone regarding a psychiatric issue:  1. EMERGENCY: For psychiatric emergencies (if you are suicidal or if there are any other safety issues) call 911 and/or go to your nearest Emergency Room immediately.   2. IF YOU NEED SOMEONE TO TALK TO RIGHT NOW: Given my clinical responsibilities, I may not be able to speak with you over the phone for a prolonged period of time.  A. You may always call The National Suicide Prevention Lifeline at 1-800-273-TALK 902-840-2893).  B. You may walk in to [facility]  Address: [address]. [place], [place] [address], Phone: [phone].  Open 24/7, No appointment required. C. Your county of residence will also have local crisis services. For [facility]: [facility] at [phone] (24 Hour Crisis Hotline)

## 2020-05-03 ENCOUNTER — Encounter: Payer: Self-pay | Admitting: Psychiatry

## 2020-05-03 ENCOUNTER — Other Ambulatory Visit: Payer: Self-pay | Admitting: Surgical

## 2020-05-03 MED ORDER — DOXYCYCLINE HYCLATE 100 MG PO TABS: 100.0000 mg | ORAL_TABLET | Freq: Two times a day (BID) | ORAL | 0 refills | Status: AC

## 2020-05-08 LAB — BASIC METABOLIC PANEL
BUN/Creatinine Ratio: 13 (ref 9–20)
BUN: 10 mg/dL (ref 6–20)
CO2: 22 mmol/L (ref 20–29)
Calcium: 9.8 mg/dL (ref 8.7–10.2)
Chloride: 102 mmol/L (ref 96–106)
Creatinine, Ser: 0.76 mg/dL (ref 0.76–1.27)
Glucose: 134 mg/dL — ABNORMAL HIGH (ref 65–99)
Potassium: 4.5 mmol/L (ref 3.5–5.2)
Sodium: 138 mmol/L (ref 134–144)
eGFR: 122 mL/min/{1.73_m2} (ref >59)

## 2020-05-08 LAB — LITHIUM LEVEL: Lithium Lvl: 0.7 mmol/L (ref 0.5–1.2)

## 2020-05-08 LAB — TSH: TSH: 3.68 u[IU]/mL (ref 0.450–4.500)

## 2020-05-10 ENCOUNTER — Encounter: Payer: Self-pay | Admitting: Psychiatry

## 2020-05-17 ENCOUNTER — Encounter: Payer: BC Managed Care – PPO | Admitting: Surgical

## 2020-05-24 ENCOUNTER — Ambulatory Visit (INDEPENDENT_AMBULATORY_CARE_PROVIDER_SITE_OTHER): Payer: BC Managed Care – PPO | Admitting: Surgical

## 2020-05-24 ENCOUNTER — Other Ambulatory Visit: Payer: Self-pay

## 2020-05-24 ENCOUNTER — Encounter: Payer: Self-pay | Admitting: Surgical

## 2020-05-24 VITALS — BP 101/66 | HR 125

## 2020-05-24 DIAGNOSIS — M009 Pyogenic arthritis, unspecified: Secondary | ICD-10-CM | POA: Diagnosis not present

## 2020-05-24 DIAGNOSIS — M069 Rheumatoid arthritis, unspecified: Secondary | ICD-10-CM

## 2020-05-24 NOTE — Progress Notes (Signed)
   Subjective:     Patient ID: Herbert Walsh, male    DOB: October 31, 1990, 30 y.o.   MRN: [id number]  Chief Complaint  Patient presents with  . Post-op Follow-up    HPI: The patient is a 30 y.o. male here for follow-up after excision of his right sternoclavicular joint infection placement of collagen powder and gel on 04/12/2018 with Dr. Ulice Bold. He is here with his mother.  They report that everything healed up very quickly after his last appointment.  He reports that he is doing well.  He has no complaints at this time.  He is not having any infectious symptoms.  Reports no pain.   Review of Systems  Constitutional: Negative.   Respiratory: Negative.   Cardiovascular: Negative.   Skin: Negative.      Objective:   Vital Signs BP 101/66 (BP Location: Left Arm, Patient Position: Sitting, Cuff Size: Large)   Pulse (!) 125   SpO2 99%  Vital Signs and Nursing Note Reviewed  Physical Exam Constitutional:      General: He is not in acute distress.    Appearance: Normal appearance. He is not ill-appearing.  HENT:     Head: Normocephalic and atraumatic.  Skin:      Neurological:     Mental Status: He is alert.       Assessment/Plan:     ICD-10-CM   1. 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 Rheumatoid arthritis, involving unspecified site, unspecified whether rheumatoid factor present (HCC)  M06.9     Patient's right sternal wound has healed nicely.  Recommend following up on an as-needed basis.  Recommend calling with questions or concerns.  Pictures taken and placed in the patient's chart with his permission  Leslee Home, PA-C 05/24/2020, 10:54 AM

## 2020-06-15 ENCOUNTER — Telehealth: Payer: Self-pay

## 2020-06-15 NOTE — Telephone Encounter (Signed)
Decline- he should have enough meds until the next visit

## 2020-06-15 NOTE — Telephone Encounter (Signed)
Received a fax requesting a refill on the sertraline 100mg    sertraline (ZOLOFT) 100 MG tablet Medication Date: 04/05/2020 Department: [facility] Ordering/Authorizing: [facility], MD    Order Providers  Prescribing Provider Encounter Provider  Neysa Hotter, MD Neysa Hotter, MD   Outpatient Medication Detail   Disp Refills Start End   sertraline (ZOLOFT) 100 MG tablet 135 tablet 0 04/05/2020    Sig - Route: Take 1.5 tablets (150 mg total) by mouth daily. - Oral   Sent to pharmacy as: sertraline (ZOLOFT) 100 MG tablet   E-Prescribing Status: Receipt confirmed by pharmacy (04/05/2020 8:32 AM EST)    Pharmacy  [id number] - [place], [place] - [address]

## 2020-06-16 NOTE — Telephone Encounter (Signed)
Decline- he has enough med until the next visit

## 2020-06-16 NOTE — Telephone Encounter (Signed)
Received a fax request for refills for the sertraline  sertraline (ZOLOFT) 100 MG tablet Medication Date: 04/05/2020 Department: [facility] Ordering/Authorizing: Neysa Hotter, MD    Order Providers  Prescribing Provider Encounter Provider  Neysa Hotter, MD Neysa Hotter, MD   Outpatient Medication Detail   Disp Refills Start End   sertraline (ZOLOFT) 100 MG tablet 135 tablet 0 04/05/2020    Sig - Route: Take 1.5 tablets (150 mg total) by mouth daily. - Oral   Sent to pharmacy as: sertraline (ZOLOFT) 100 MG tablet   E-Prescribing Status: Receipt confirmed by pharmacy (04/05/2020 8:32 AM EST)    Pharmacy  [id number] - [place], [place] - [address]

## 2020-06-21 ENCOUNTER — Telehealth: Payer: Self-pay

## 2020-06-21 NOTE — Telephone Encounter (Signed)
Decline- he has enough until the next visit

## 2020-06-21 NOTE — Telephone Encounter (Signed)
recevied fax requesting a refill on the sertraline 100mg 

## 2020-06-28 ENCOUNTER — Ambulatory Visit (INDEPENDENT_AMBULATORY_CARE_PROVIDER_SITE_OTHER): Payer: BC Managed Care – PPO | Admitting: Psychiatry

## 2020-06-28 ENCOUNTER — Other Ambulatory Visit: Payer: Self-pay

## 2020-06-28 ENCOUNTER — Encounter: Payer: Self-pay | Admitting: Psychiatry

## 2020-06-28 VITALS — BP 122/77 | HR 128 | Temp 98.1°F | Wt 267.0 lb

## 2020-06-28 DIAGNOSIS — F6381 Intermittent explosive disorder: Secondary | ICD-10-CM

## 2020-06-28 DIAGNOSIS — F331 Major depressive disorder, recurrent, moderate: Secondary | ICD-10-CM

## 2020-06-28 DIAGNOSIS — F4310 Post-traumatic stress disorder, unspecified: Secondary | ICD-10-CM

## 2020-06-28 MED ORDER — SERTRALINE HCL 100 MG PO TABS: 200.0000 mg | ORAL_TABLET | Freq: Every day | ORAL | 0 refills | Status: DC

## 2020-06-28 MED ORDER — LITHIUM CARBONATE ER 450 MG PO TBCR
450.0000 mg | EXTENDED_RELEASE_TABLET | Freq: Every day | ORAL | 0 refills | Status: DC
Start: 2020-06-28 — End: 2020-09-09

## 2020-06-28 MED ORDER — CLONIDINE HCL 0.2 MG PO TABS: 0.2000 mg | ORAL_TABLET | Freq: Three times a day (TID) | ORAL | 1 refills | Status: DC

## 2020-06-28 MED ORDER — RISPERIDONE 4 MG PO TABS: 4.0000 mg | ORAL_TABLET | Freq: Every day | ORAL | 1 refills | Status: DC

## 2020-06-28 MED ORDER — TRAZODONE HCL 100 MG PO TABS: 200.0000 mg | ORAL_TABLET | Freq: Every day | ORAL | 1 refills | Status: DC

## 2020-06-28 MED ORDER — LITHIUM CARBONATE ER 300 MG PO TBCR: EXTENDED_RELEASE_TABLET | ORAL | 0 refills | Status: DC

## 2020-06-28 NOTE — Progress Notes (Deleted)
Wt Readings from Last 3 Encounters:  06/28/20 267 lb (121.1 kg)  04/12/20 263 lb 7.2 oz (119.5 kg)  03/16/20 265 lb 3.2 oz (120.3 kg)
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
## 2020-06-28 NOTE — Patient Instructions (Signed)
1.Increasesertraline 200 mg daily 2.Continuerisperidone4mg  at night 3.Decreaselithium450mg  at night 4.Continueclonidine 0.2 mg three times a day 6.IncreaseTrazodone200mg  daily  7.Next appointment: 7/11 at 10 AM

## 2020-07-02 ENCOUNTER — Ambulatory Visit: Payer: Self-pay

## 2020-07-02 ENCOUNTER — Encounter: Payer: Self-pay | Admitting: Orthopaedic Surgery

## 2020-07-02 ENCOUNTER — Ambulatory Visit (INDEPENDENT_AMBULATORY_CARE_PROVIDER_SITE_OTHER): Payer: BC Managed Care – PPO | Admitting: Orthopaedic Surgery

## 2020-07-02 DIAGNOSIS — M25522 Pain in left elbow: Secondary | ICD-10-CM

## 2020-07-02 DIAGNOSIS — M25521 Pain in right elbow: Secondary | ICD-10-CM

## 2020-07-02 DIAGNOSIS — M898X1 Other specified disorders of bone, shoulder: Secondary | ICD-10-CM | POA: Diagnosis not present

## 2020-07-02 NOTE — Progress Notes (Signed)
Office Visit Note   Patient: Herbert Walsh           Date of Birth: 1990/06/22           MRN: [id number] Visit Date: 07/02/2020              Requested by: Ignatius Specking, MD [address] [place],  [place] [address] PCP: Ignatius Specking, MD   Assessment & Plan: Visit Diagnoses:  1. Bilateral elbow joint pain   2. 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  These have not responded to an office aspiration and given enlargement and worsening symptoms I have recommended surgical excision starting with the right 1 first.  Risk benefits rehab recovery time reviewed with the patient.  Questions encouraged and answered.  For the clavicle pain given his history of multiple infections CT scan of the chest to evaluate thoroughly.  We will notify the patient of the results.  My surgery scheduler Eunice Blase will reach out to the patient to schedule surgery for the right elbow.  Follow-Up Instructions: No follow-ups on file.   Orders:  Orders Placed This Encounter  Procedures  . XR Elbow Complete Left (3+View)  . XR Elbow Complete Right (3+View)  . CT CHEST W CONTRAST   No orders of the defined types were placed in this encounter.     Procedures: No procedures performed   Clinical Data: No additional findings.   Subjective: Chief Complaint  Patient presents with  . Right Elbow - Pain  . Left Elbow - Pain    Herbert Walsh is a 30 year old gentleman comes in for bilateral elbow masses and left clavicle pain.  He does have a history of septic Poydras joint arthritis and right knee septic arthritis.  He has rheumatoid arthritis and takes methotrexate.  He has had swelling to both of his elbows for a few years.  He has a history of rheumatoid nodules as well.  The elbows were aspirated at PCP office couple years ago but the relief was only a couple of days.  These areas have continued to enlarge and become more symptomatic.  He is also tender  over the left clavicle.   Review of Systems  Constitutional: Negative.   All other systems reviewed and are negative.    Objective: Vital Signs: There were no vitals taken for this visit.  Physical Exam Vitals and nursing note reviewed.  Constitutional:      Appearance: He is well-developed.  Pulmonary:     Effort: Pulmonary effort is normal.  Abdominal:     Palpations: Abdomen is soft.  Skin:    General: Skin is warm.  Neurological:     Mental Status: He is alert and oriented to person, place, and time.  Psychiatric:        Behavior: Behavior normal.        Thought Content: Thought content normal.        Judgment: Judgment normal.     Ortho Exam Right elbow shows a large fluctuant mass with palpable nodules.  Overall mass is greater than 5cm.  There is no signs of infection.  Elbow range of motion is normal.  Normal strength.  Left elbow shows a fluctuant mass with palpable nodules approximately 4 cm in diameter.  No signs of infection.  Elbow range of motion is normal.  Normal strength.  Left clavicle is tender to palpation cellulitis or fluctuance or  infection.  Specialty Comments:  No specialty comments available.  Imaging: XR Elbow Complete Left (3+View)  Result Date: 07/02/2020 No acute bony abnormalities.  Soft tissue swelling over the dorsal aspect of the proximal ulna.  No calcifications.  XR Elbow Complete Right (3+View)  Result Date: 07/02/2020 Large soft tissue swelling over the dorsal aspect of the elbow.  No calcifications.    PMFS History: Patient Active Problem List   Diagnosis Date Noted  . COVID-19 01/22/2020  . Nonhealing surgical wound 12/17/2019  . Shoulder pain, right 11/19/2019  . Encounter for surgical wound dressing change 09/29/2019  . Encounter for postoperative wound check 09/24/2019  . Change or removal of surgical wound dressing 09/03/2019  . Draining cutaneous sinus tract 08/27/2019  . Open wound of chest wall with  complication, right, sequela 08/20/2019  . Wound check, abscess 08/06/2019  . Neuropathic pain of chest 07/23/2019  . Wound with infection determined by examination 07/17/2019  . Open wound of chest wall 07/09/2019  . Abscess of parasternal chest wall 07/02/2019  . Osteomyelitis (HCC) 06/20/2019  . MSSA (methicillin susceptible Staphylococcus aureus) infection 06/20/2019  . Sternoclavicular (joint) (ligament) sprain 06/18/2019  . Sternal osteomyelitis (HCC) 06/18/2019  . Septic arthritis of knee, right (HCC) 05/31/2019  . Myofascitis 05/31/2019  . Prediabetes 05/31/2019  . MSSA bacteremia 05/31/2019  . Septic arthritis of right sternoclavicular joint (HCC) 05/30/2019  . Rheumatoid arthritis (HCC) 05/30/2019  . Glucosuria 05/30/2019  . 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 Difficulty controlling anger 03/19/2019  . PTSD (post-traumatic stress disorder) 11/01/2017   Past Medical History:  Diagnosis Date  . Anemia   . Arthritis    rheumatoid  . Asthma   . Bipolar disorder (HCC)   . Depression   . GERD (gastroesophageal reflux disease)   . History of COVID-19 03/29/2020  . Hypertension 2019  . Ischemic colitis (HCC) 09/2008  . MSSA (methicillin susceptible Staphylococcus aureus)    sternaoclavicular absecess drainage  . Osteoarthritis    sterum and right shoulder  . PTSD (post-traumatic stress disorder)   . Tachycardia     Family History  Problem Relation Age of Onset  . Alcohol abuse Mother   . Depression Mother   . Drug abuse Mother   . Physical abuse Mother   . Sexual abuse Mother   . ADD / ADHD Paternal Aunt   . Alcohol abuse Maternal Grandfather   . Bipolar disorder Maternal Grandmother   . Schizophrenia Maternal Grandmother   . Drug abuse Maternal Grandmother   . Alcohol abuse Paternal Grandfather   . Depression Paternal Grandfather   . Depression Paternal Grandmother   . Drug abuse Paternal Grandmother     Past Surgical History:  Procedure Laterality Date   . APPENDECTOMY    . APPLICATION OF WOUND VAC Right 06/20/2019   Procedure: APPLICATION OF WOUND VAC;  Surgeon: [name], MD;  Location: [facility] OR;  Service: Vascular;  Laterality: Right;  . APPLICATION OF WOUND VAC Right 06/23/2019   Procedure: APPLICATION OF WOUND VAC;  Surgeon: [name], MD;  Location: [facility] OR;  Service: Thoracic;  Laterality: Right;  . COLONOSCOPY     x 2  . I & D EXTREMITY Right 05/31/2019   Procedure: IRRIGATION AND DEBRIDEMENT LOWER EXTREMITY;  Surgeon: Tarry Kos, MD;  Location: [facility] OR;  Service: Orthopedics;  Laterality: Right;  . I & D EXTREMITY Right 06/20/2019   Procedure: DEBRIDEMENT OF STERNOCLAVICULAR JOINT ABSCESS;  Surgeon: [name], MD;  Location: [facility]  OR;  Service: Vascular;  Laterality: Right;  . INCISION AND DRAINAGE OF WOUND Right 04/12/2020   Procedure: excision of right sternoclavicular joint infection;  Surgeon: Peggye Form, DO;  Location: [facility];  Service: Plastics;  Laterality: Right;  45 min total  . STERNAL WOUND DEBRIDEMENT Right 09/19/2019   Procedure: EXCISIONAL DEBRIDEMENT AND IRRIGATION OF RIGHT STERNOCLAVICULAR JOINT WOUND;  Surgeon: [name], MD;  Location: [facility] OR;  Service: Thoracic;  Laterality: Right;  . TEE WITHOUT CARDIOVERSION N/A 06/05/2019   Procedure: TRANSESOPHAGEAL ECHOCARDIOGRAM (TEE);  Surgeon: Lewayne Bunting, MD;  Location: [facility] ENDOSCOPY;  Service: Cardiovascular;  Laterality: N/A;  . WOUND EXPLORATION Right 06/23/2019   Procedure: irrigation and clean out right shoulder;  Surgeon: [name], MD;  Location: [facility] OR;  Service: Thoracic;  Laterality: Right;   Social History   Occupational History  . Not on file  Tobacco Use  . Smoking status: Current Every Day Smoker    Packs/day: 0.50    Years: 15.00    Pack years: 7.50    Types: Cigarettes  . Smokeless tobacco: Never Used  Vaping Use  . Vaping Use: Never used  Substance and Sexual Activity  . Alcohol use: No     Comment: rare beer  . Drug use: No  . Sexual activity: Not Currently

## 2020-07-19 ENCOUNTER — Encounter (HOSPITAL_COMMUNITY): Payer: Self-pay

## 2020-07-19 ENCOUNTER — Emergency Department (HOSPITAL_COMMUNITY): Payer: Medicaid Other

## 2020-07-19 ENCOUNTER — Other Ambulatory Visit: Payer: Self-pay

## 2020-07-19 ENCOUNTER — Emergency Department (HOSPITAL_COMMUNITY)
Admission: EM | Admit: 2020-07-19 | Discharge: 2020-07-19 | Disposition: A | Discharge status: Home / Self Care | Payer: Medicaid Other | Attending: Emergency Medicine | Admitting: Emergency Medicine

## 2020-07-19 DIAGNOSIS — I10 Essential (primary) hypertension: Secondary | ICD-10-CM | POA: Insufficient documentation

## 2020-07-19 DIAGNOSIS — Z79899 Other long term (current) drug therapy: Secondary | ICD-10-CM | POA: Diagnosis not present

## 2020-07-19 DIAGNOSIS — T50901A Poisoning by unspecified drugs, medicaments and biological substances, accidental (unintentional), initial encounter: Secondary | ICD-10-CM | POA: Insufficient documentation

## 2020-07-19 DIAGNOSIS — F1490 Cocaine use, unspecified, uncomplicated: Secondary | ICD-10-CM | POA: Insufficient documentation

## 2020-07-19 DIAGNOSIS — R451 Restlessness and agitation: Secondary | ICD-10-CM | POA: Insufficient documentation

## 2020-07-19 DIAGNOSIS — F17210 Nicotine dependence, cigarettes, uncomplicated: Secondary | ICD-10-CM | POA: Diagnosis not present

## 2020-07-19 DIAGNOSIS — Z8616 Personal history of COVID-19: Secondary | ICD-10-CM | POA: Insufficient documentation

## 2020-07-19 DIAGNOSIS — R000 Tachycardia, unspecified: Secondary | ICD-10-CM | POA: Insufficient documentation

## 2020-07-19 DIAGNOSIS — F3130 Bipolar disorder, current episode depressed, mild or moderate severity, unspecified: Secondary | ICD-10-CM | POA: Insufficient documentation

## 2020-07-19 DIAGNOSIS — R4182 Altered mental status, unspecified: Secondary | ICD-10-CM | POA: Diagnosis present

## 2020-07-19 DIAGNOSIS — J45909 Unspecified asthma, uncomplicated: Secondary | ICD-10-CM | POA: Diagnosis not present

## 2020-07-19 LAB — URINALYSIS, ROUTINE W REFLEX MICROSCOPIC
Bacteria, UA: NONE SEEN
Bilirubin Urine: NEGATIVE
Glucose, UA: NEGATIVE mg/dL
Hgb urine dipstick: NEGATIVE
Ketones, ur: NEGATIVE mg/dL
Leukocytes,Ua: NEGATIVE
Nitrite: NEGATIVE
Protein, ur: 100 mg/dL — AB
Specific Gravity, Urine: 1.018 (ref 1.005–1.030)
pH: 5 (ref 5.0–8.0)

## 2020-07-19 LAB — COMPREHENSIVE METABOLIC PANEL
ALT: 18 U/L (ref 0–44)
AST: 33 U/L (ref 15–41)
Albumin: 4 g/dL (ref 3.5–5.0)
Alkaline Phosphatase: 107 U/L (ref 38–126)
Anion gap: 18 — ABNORMAL HIGH (ref 5–15)
BUN: 9 mg/dL (ref 6–20)
CO2: 16 mmol/L — ABNORMAL LOW (ref 22–32)
Calcium: 9.5 mg/dL (ref 8.9–10.3)
Chloride: 104 mmol/L (ref 98–111)
Creatinine, Ser: 1.43 mg/dL — ABNORMAL HIGH (ref 0.61–1.24)
GFR, Estimated: >60 mL/min (ref >60)
Glucose, Bld: 176 mg/dL — ABNORMAL HIGH (ref 70–99)
Potassium: 3.6 mmol/L (ref 3.5–5.1)
Sodium: 138 mmol/L (ref 135–145)
Total Bilirubin: 0.4 mg/dL (ref 0.3–1.2)
Total Protein: 7.8 g/dL (ref 6.5–8.1)

## 2020-07-19 LAB — CBC WITH DIFFERENTIAL/PLATELET
Abs Immature Granulocytes: 0.14 10*3/uL — ABNORMAL HIGH (ref 0.00–0.07)
Basophils Absolute: 0.1 10*3/uL (ref 0.0–0.1)
Basophils Relative: 0 %
Eosinophils Absolute: 0.2 10*3/uL (ref 0.0–0.5)
Eosinophils Relative: 1 %
HCT: 41.8 % (ref 39.0–52.0)
Hemoglobin: 13.1 g/dL (ref 13.0–17.0)
Immature Granulocytes: 1 %
Lymphocytes Relative: 8 %
Lymphs Abs: 1.8 10*3/uL (ref 0.7–4.0)
MCH: 23.6 pg — ABNORMAL LOW (ref 26.0–34.0)
MCHC: 31.3 g/dL (ref 30.0–36.0)
MCV: 75.2 fL — ABNORMAL LOW (ref 80.0–100.0)
Monocytes Absolute: 1 10*3/uL (ref 0.1–1.0)
Monocytes Relative: 5 %
Neutro Abs: 18.4 10*3/uL — ABNORMAL HIGH (ref 1.7–7.7)
Neutrophils Relative %: 85 %
Platelets: 404 10*3/uL — ABNORMAL HIGH (ref 150–400)
RBC: 5.56 MIL/uL (ref 4.22–5.81)
RDW: 18.8 % — ABNORMAL HIGH (ref 11.5–15.5)
WBC: 21.6 10*3/uL — ABNORMAL HIGH (ref 4.0–10.5)
nRBC: 0 % (ref 0.0–0.2)

## 2020-07-19 LAB — CK: Total CK: 140 U/L (ref 49–397)

## 2020-07-19 LAB — TROPONIN I (HIGH SENSITIVITY): Troponin I (High Sensitivity): 2 ng/L (ref <18)

## 2020-07-19 LAB — ACETAMINOPHEN LEVEL: Acetaminophen (Tylenol), Serum: <10 ug/mL — ABNORMAL LOW (ref 10–30)

## 2020-07-19 LAB — RAPID URINE DRUG SCREEN, HOSP PERFORMED
Amphetamines: NOT DETECTED
Barbiturates: NOT DETECTED
Benzodiazepines: NOT DETECTED
Cocaine: NOT DETECTED
Opiates: POSITIVE — AB
Tetrahydrocannabinol: NOT DETECTED

## 2020-07-19 LAB — LACTIC ACID, PLASMA: Lactic Acid, Venous: 1.8 mmol/L (ref 0.5–1.9)

## 2020-07-19 LAB — ETHANOL: Alcohol, Ethyl (B): <10 mg/dL (ref <10)

## 2020-07-19 IMAGING — CT CT HEAD W/O CM
3 series · 15 of 47 positions shown, 18 images · non-contrast
Comparison: None.

CLINICAL DATA: Altered mental status.  Patient found down

EXAM:
CT HEAD WITHOUT CONTRAST
TECHNIQUE: Contiguous axial images were obtained from the base of the skull
through the vertex without intravenous contrast.

[Series 2: head w o · axial · 0.44mm/px · z∈[+73,+198]mm · 9 of 31 slices shown, 12 images]
[im 3/31  brain]
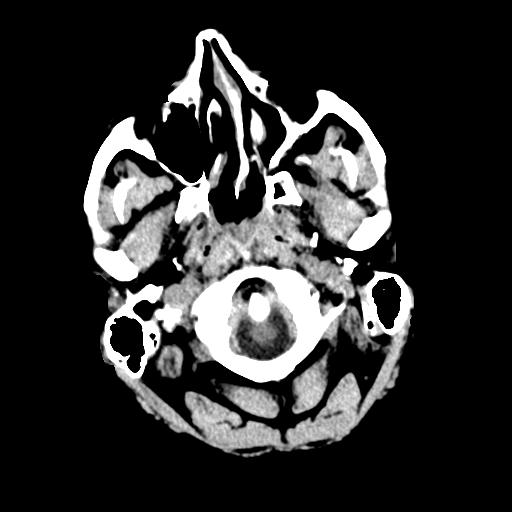
[im 3/31  bone]
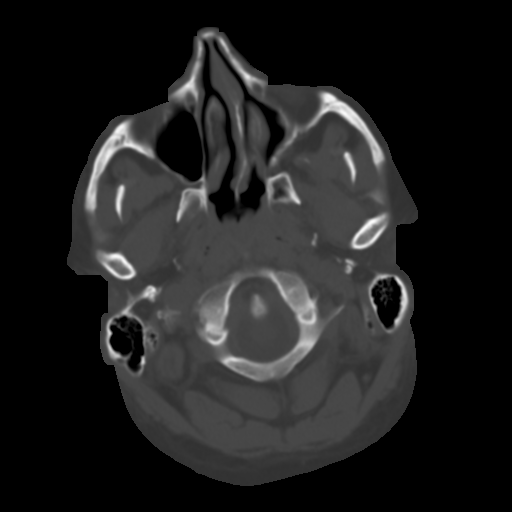
[im 6/31  brain]
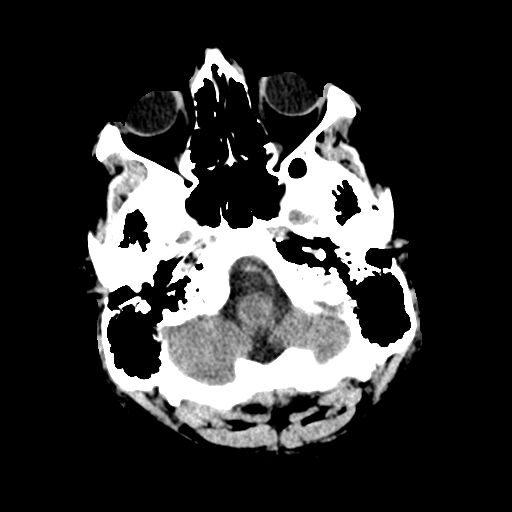
[im 9/31  brain]
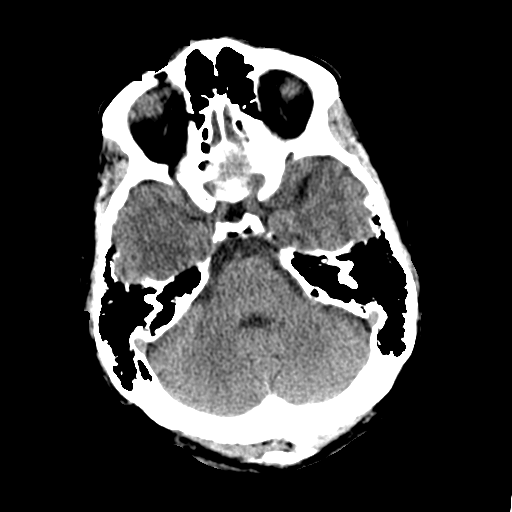
[im 12/31  brain]
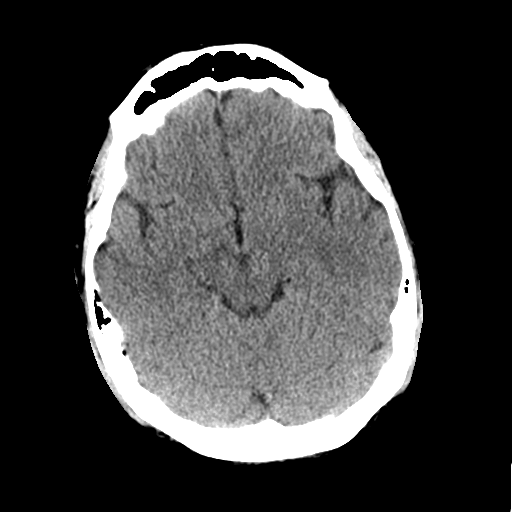
[im 16/31  brain]
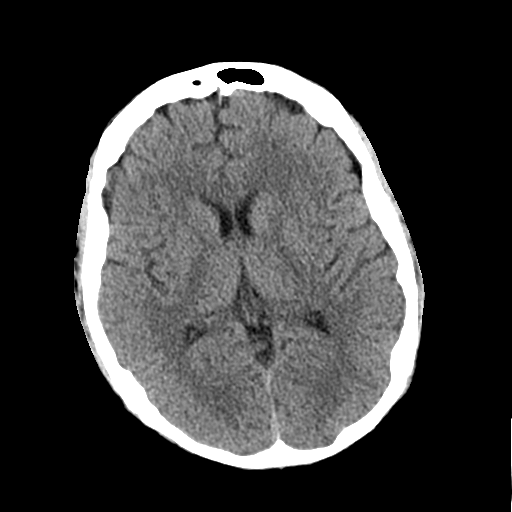
[im 16/31  bone]
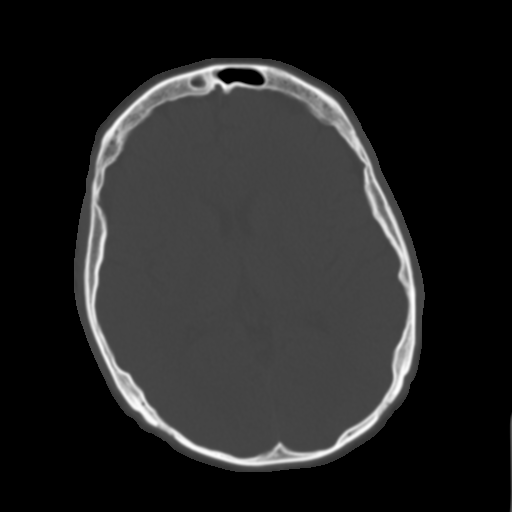
[im 19/31  brain]
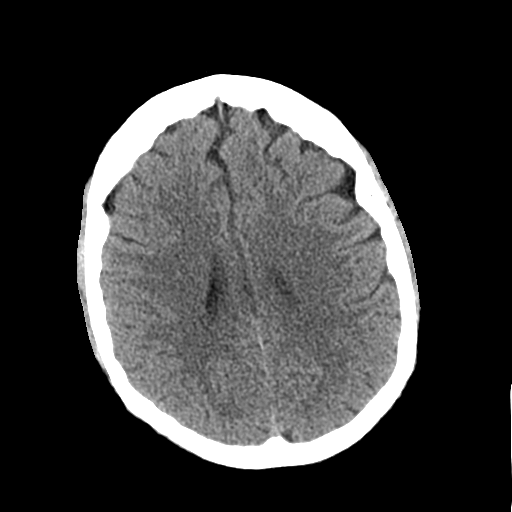
[im 22/31  brain]
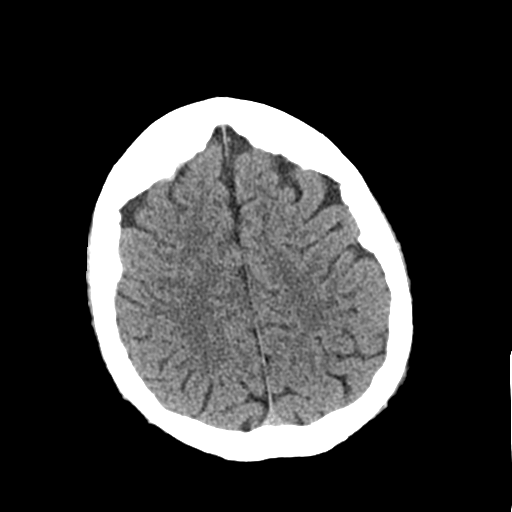
[im 25/31  brain]
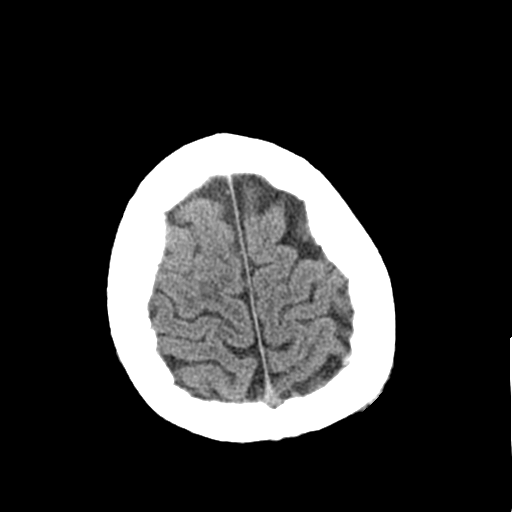
[im 28/31  brain]
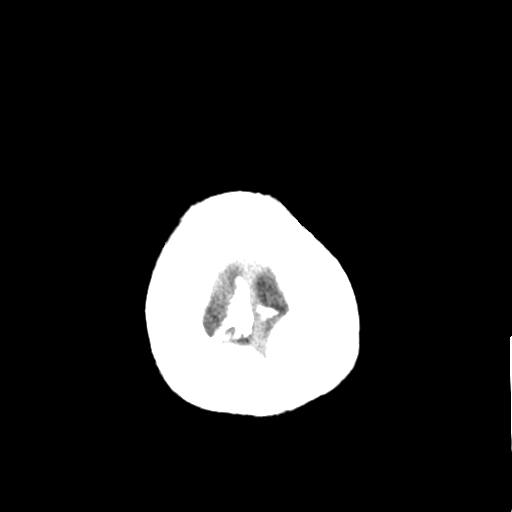
[im 28/31  bone]
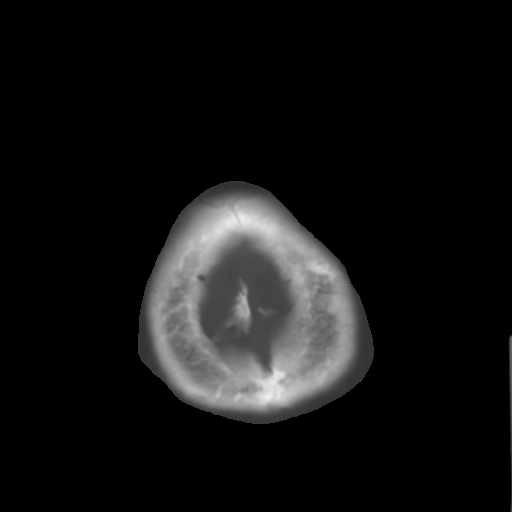

[Series 4: coronal soft · coronal · 0.30mm/px · 3 of 67 slices shown]
[im 23/67  brain]
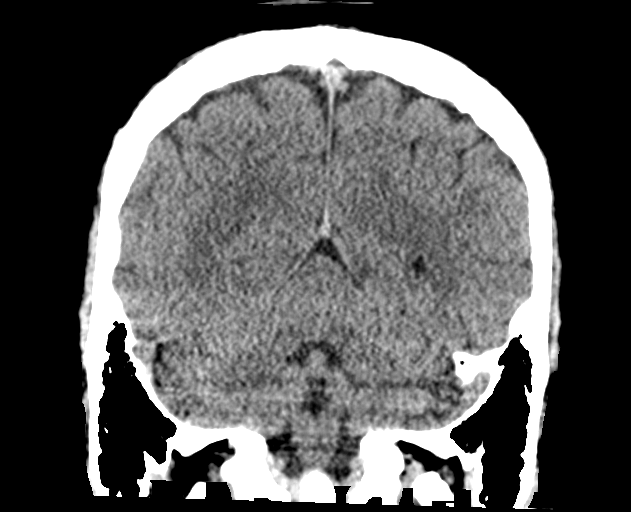
[im 30/67  brain]
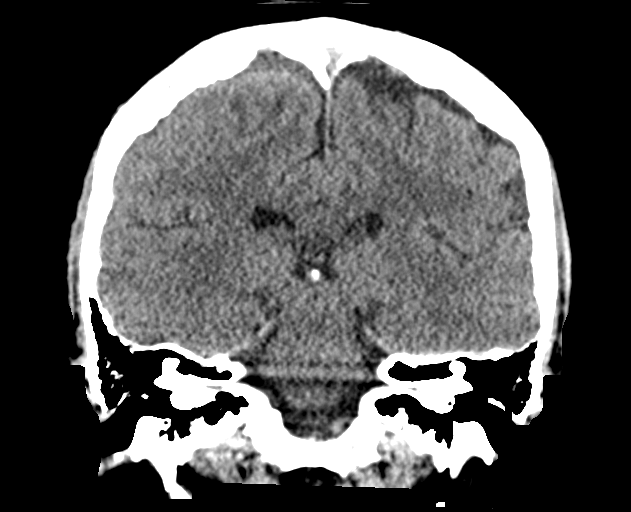
[im 37/67  brain]
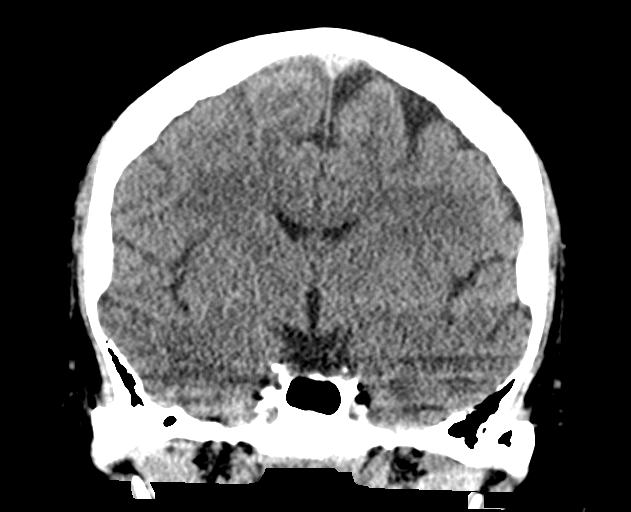

[Series 5: sagittal soft · sagittal · 0.31mm/px · 3 of 59 slices shown]
[im 20/59  brain]
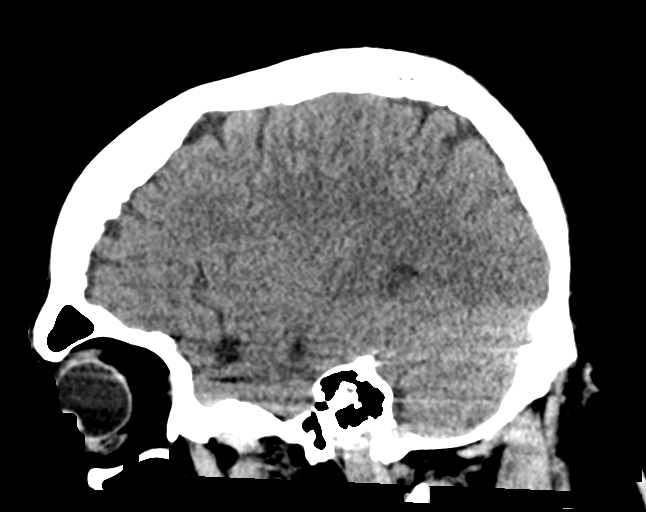
[im 30/59  brain]
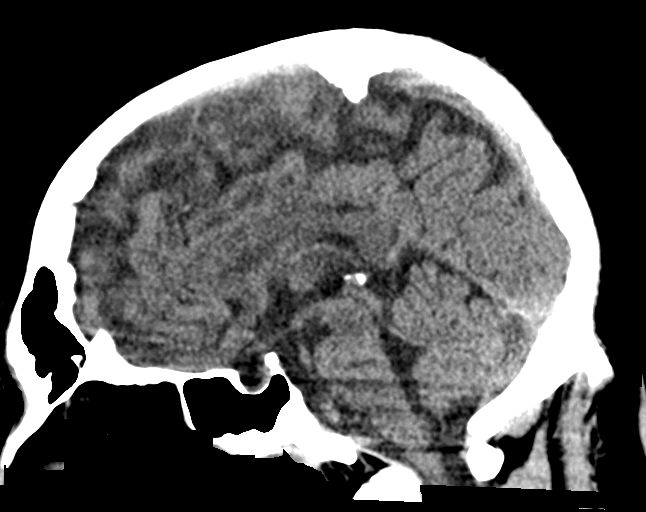
[im 39/59  brain]
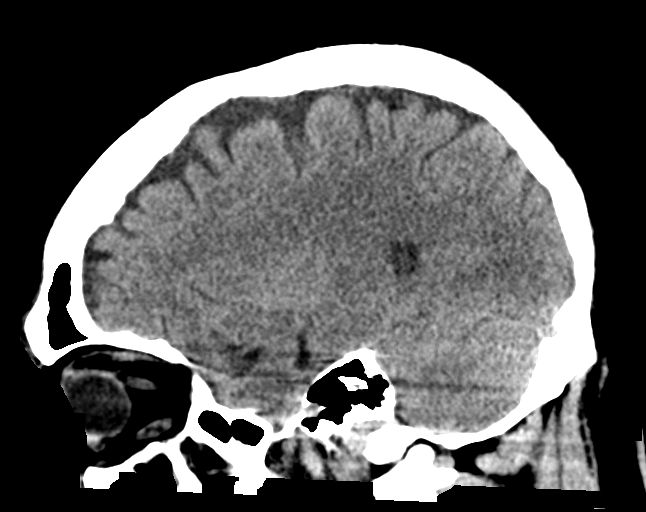

[15 of 47 positions shown; findings below may reference images not displayed]

FINDINGS: Brain: No evidence of acute infarction, hemorrhage, hydrocephalus,
extra-axial collection or mass lesion/mass effect.

Vascular: No hyperdense vessel or unexpected calcification.

Skull: Normal. Negative for fracture or focal lesion.

Sinuses/Orbits: Left maxillary sinus opacification. Remaining
paranasal sinuses and mastoid air cells are clear. Orbital
structures unremarkable.

Other: Negative for scalp hematoma.
IMPRESSION: 1. No acute intracranial findings.
2. Left maxillary sinus disease.

## 2020-07-19 IMAGING — DX DG CHEST 2V
2 series · 2 of 2 positions shown · non-contrast
Comparison: [DATE].

CLINICAL DATA: Altered mental status.

EXAM:
CHEST - 2 VIEW

[chest lat]
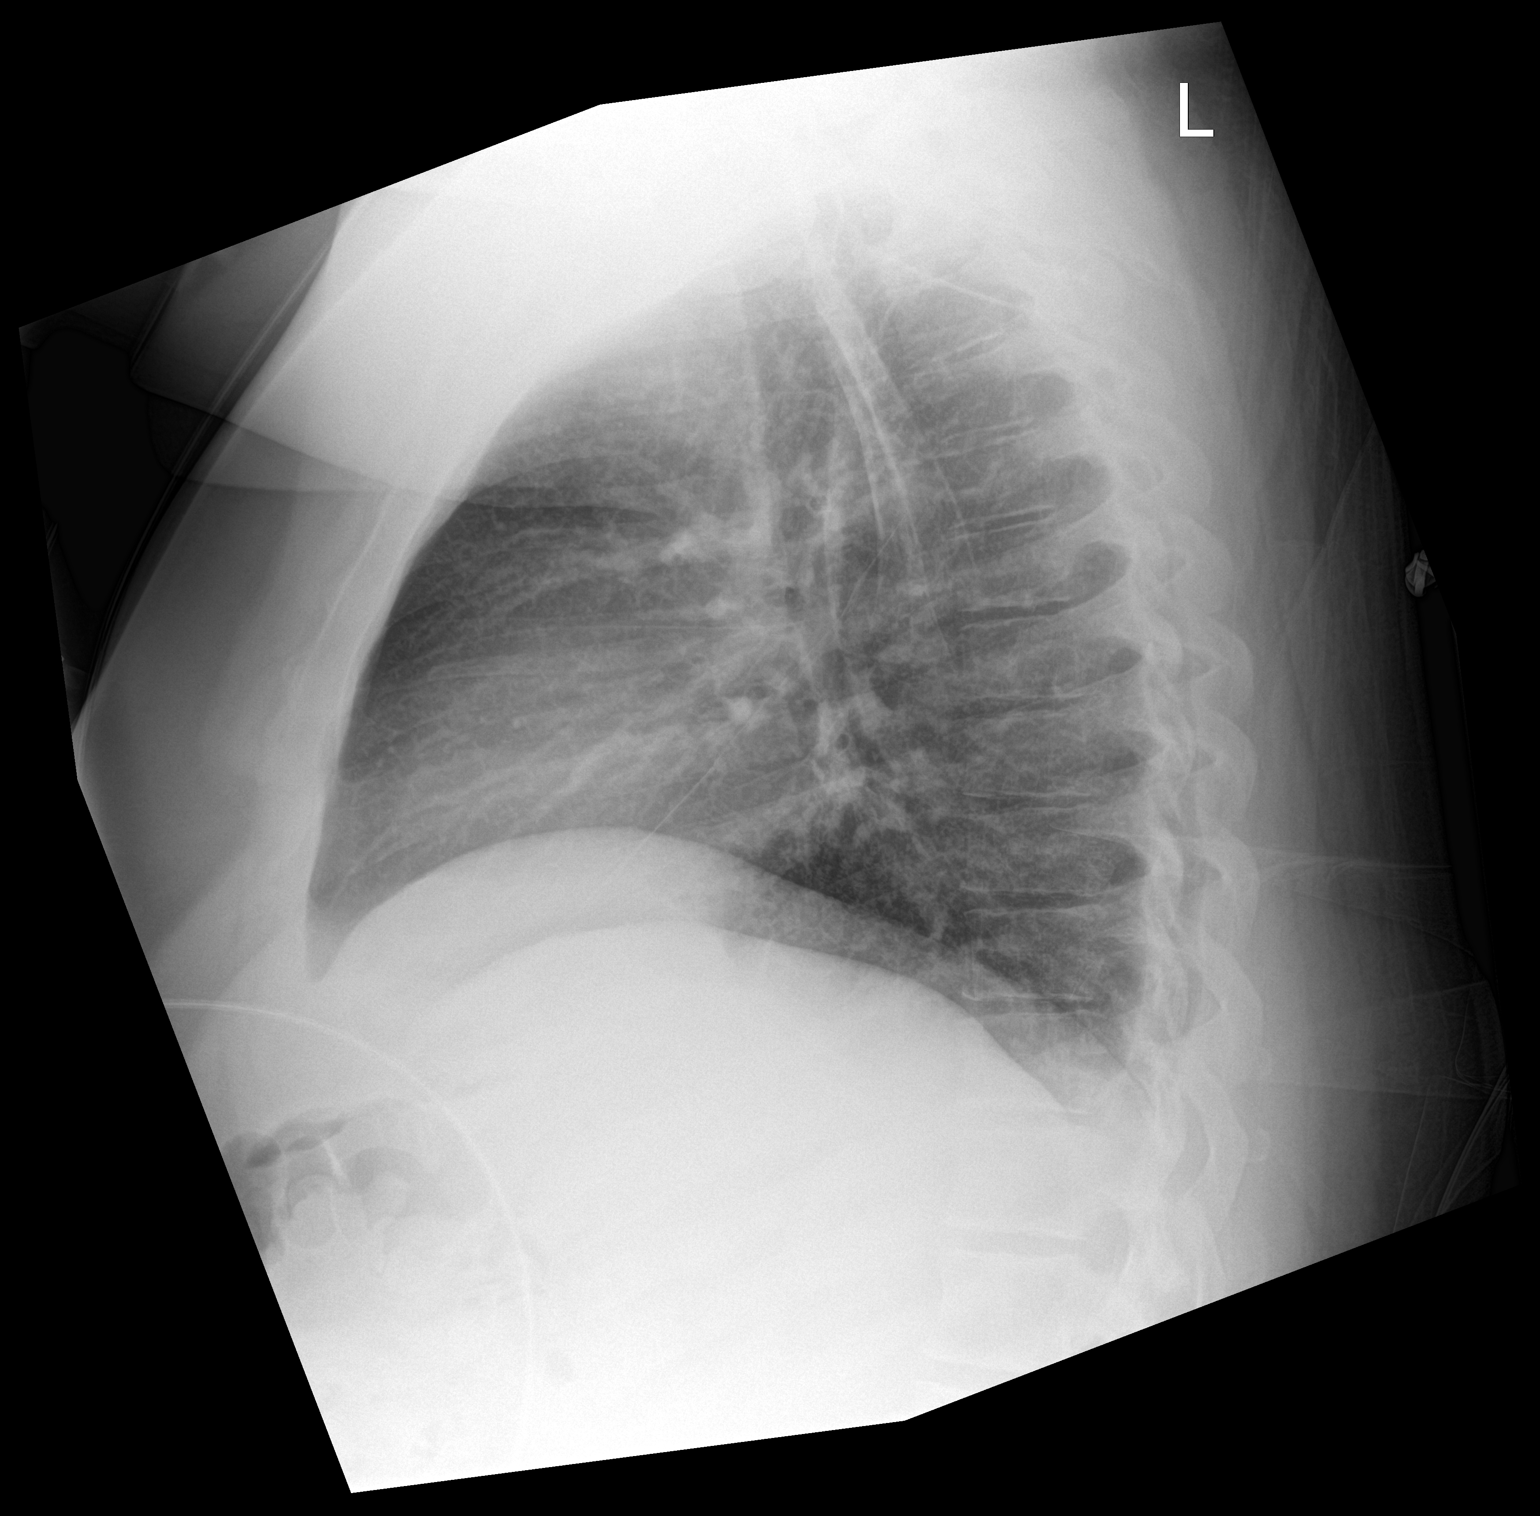

[chest ap]
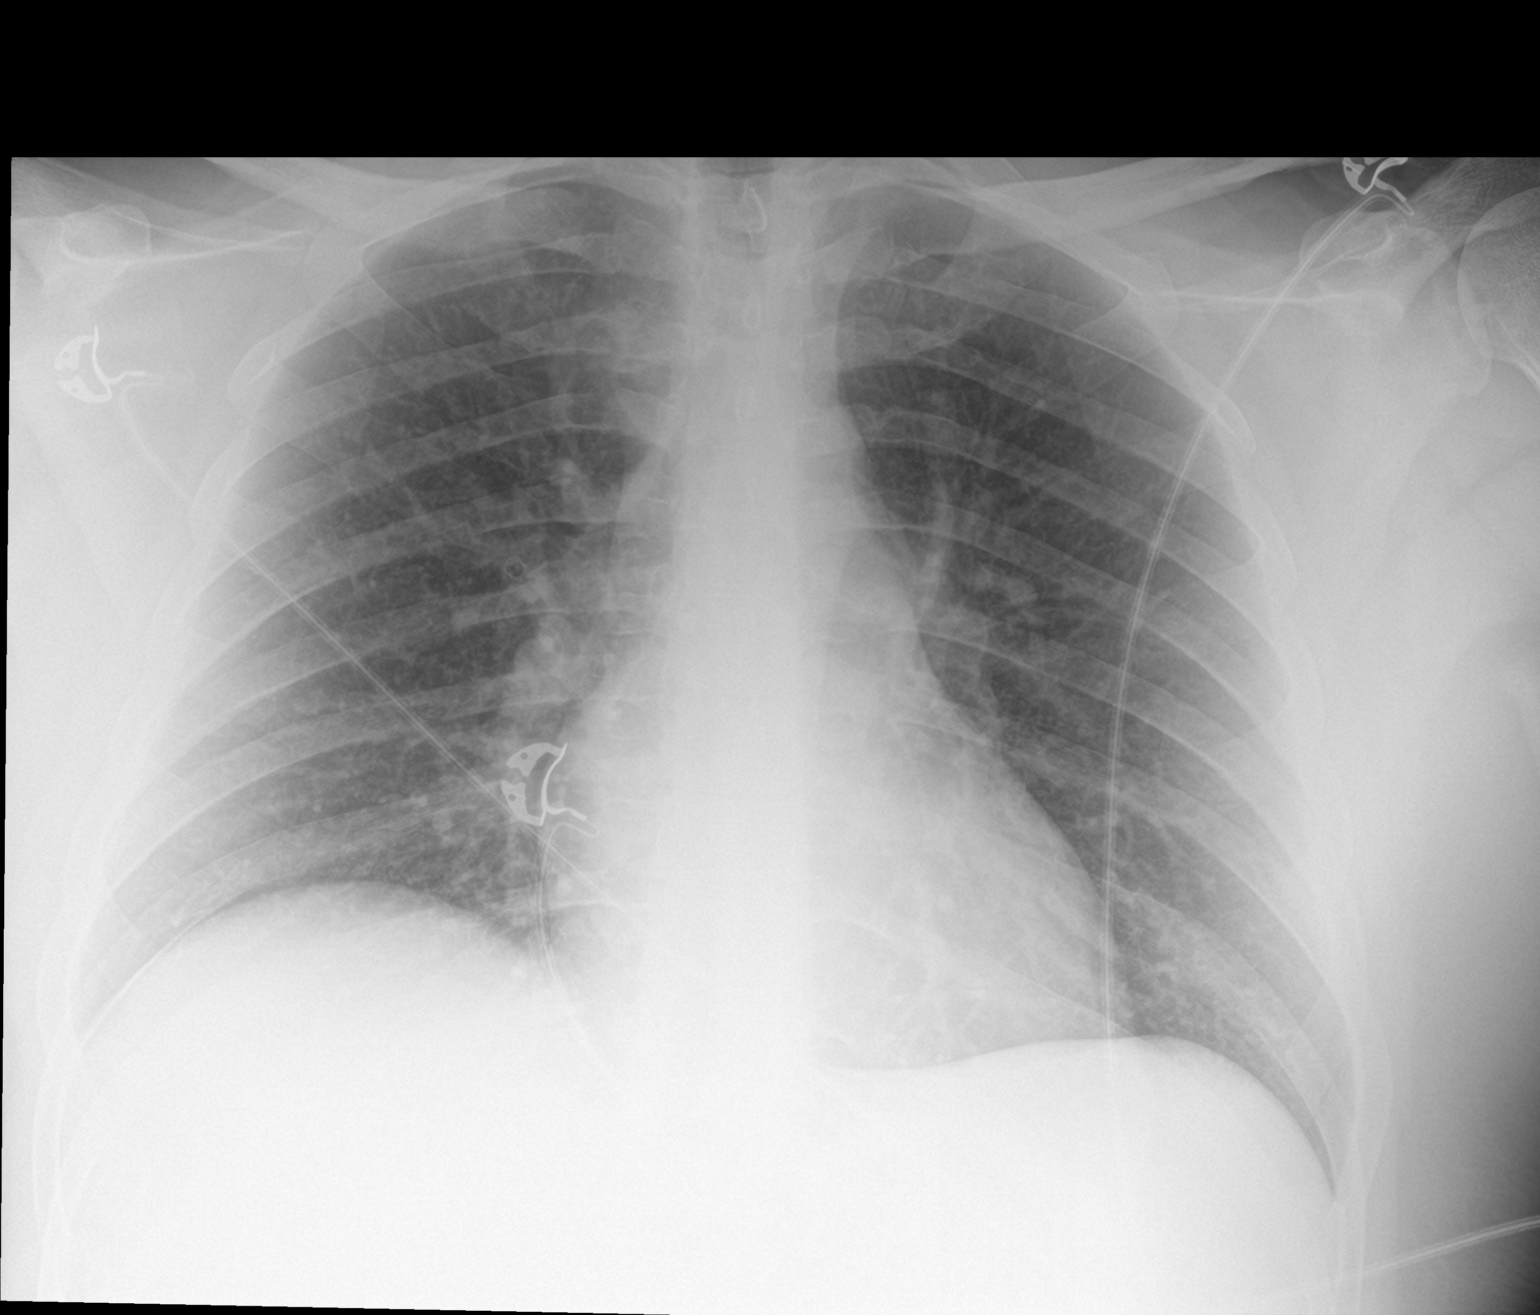

[2 of 2 positions shown; findings below may reference images not displayed]

FINDINGS: Cardiomediastinal silhouette mildly accentuated by AP technique. No
consolidation. No visible pleural effusions or pneumothorax. Eroded
medial right clavicle, similar in appearance to priors.
IMPRESSION: 1. No evidence of active cardiopulmonary disease.
2. Eroded medial right clavicle, similar in appearance to priors.

## 2020-07-19 MED ORDER — SODIUM CHLORIDE 0.9 % IV BOLUS
1000.0000 mL | Freq: Once | INTRAVENOUS | Status: AC
  Administered 2020-07-19: 1000 mL via INTRAVENOUS

## 2020-07-19 MED ORDER — LORAZEPAM 2 MG/ML IJ SOLN: 2.0000 mg | Freq: Once | INTRAMUSCULAR | Status: DC

## 2020-07-19 MED ORDER — DIPHENHYDRAMINE HCL 50 MG/ML IJ SOLN
50.0000 mg | Freq: Once | INTRAMUSCULAR | Status: AC
  Administered 2020-07-19: 50 mg via INTRAVENOUS

## 2020-07-19 MED ORDER — LORAZEPAM 2 MG/ML IJ SOLN
2.0000 mg | Freq: Once | INTRAMUSCULAR | Status: AC
  Administered 2020-07-19: 2 mg via INTRAVENOUS

## 2020-07-19 MED ORDER — HALOPERIDOL LACTATE 5 MG/ML IJ SOLN: 5.0000 mg | Freq: Once | INTRAMUSCULAR | Status: DC

## 2020-07-19 MED ORDER — HALOPERIDOL LACTATE 5 MG/ML IJ SOLN
5.0000 mg | Freq: Once | INTRAMUSCULAR | Status: AC
  Administered 2020-07-19: 5 mg via INTRAVENOUS

## 2020-07-19 MED ORDER — DIPHENHYDRAMINE HCL 50 MG/ML IJ SOLN: 50.0000 mg | Freq: Once | INTRAMUSCULAR | Status: DC
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
## 2020-07-19 NOTE — ED Notes (Signed)
Pt calm and cooperative, removed soft restraints.

## 2020-07-19 NOTE — ED Notes (Signed)
Pt transported to XR.  

## 2020-07-19 NOTE — Discharge Instructions (Signed)
Lab work and imaging are all reassuring you were extremely dehydrated here please continue to hydrate at home.  Please stop taking over-the-counter cough medication.  Given you information for outpatient counseling please follow-up.  Come back to the emergency department if you develop chest pain, shortness of breath, severe abdominal pain, uncontrolled nausea, vomiting, diarrhea.

## 2020-07-19 NOTE — ED Notes (Signed)
Pt provided discharge instructions and prescription information. Pt was given the opportunity to ask questions and questions were answered. Discharge signature not obtained in the setting of the COVID-19 pandemic in order to reduce high touch surfaces.  ° °

## 2020-07-19 NOTE — ED Provider Notes (Signed)
[facility] EMERGENCY DEPARTMENT Provider Note   CSN: [id number] Arrival date & time: 07/19/20  1652     History Chief Complaint  Patient presents with  . Drug Overdose    Herbert Walsh is a 30 y.o. male.  HPI   Will defer HPI due to level 5 caveat altered mental status.  Patient with significant medical history of bipolar, depression, hypertension, PTSD presents to the emergency department via EMS with four-point restraints.  EMS  states that patient was found passed out on his yard, he was sweating profusely and was agitated on arrival.  They endorse that patient had taken 6-8 cough medicine to try and get high.  They are unsure if he took any other drugs at this time, they did not think this was a suicide attempt, they states that mother endorses that he does this frequently.  Patient was able to answer some my questions, he had no pain at this time, he denies suicidal or homicidal ideations, he denies any other illicit drug use.   Past Medical History:  Diagnosis Date  . Anemia   . Arthritis    rheumatoid  . Asthma   . Bipolar disorder (HCC)   . Depression   . GERD (gastroesophageal reflux disease)   . History of COVID-19 03/29/2020  . Hypertension 2019  . Ischemic colitis (HCC) 09/2008  . MSSA (methicillin susceptible Staphylococcus aureus)    sternaoclavicular absecess drainage  . Osteoarthritis    sterum and right shoulder  . PTSD (post-traumatic stress disorder)   . Tachycardia     Patient Active Problem List   Diagnosis Date Noted  . COVID-19 01/22/2020  . Nonhealing surgical wound 12/17/2019  . Shoulder pain, right 11/19/2019  . Encounter for surgical wound dressing change 09/29/2019  . Encounter for postoperative wound check 09/24/2019  . Change or removal of surgical wound dressing 09/03/2019  . Draining cutaneous sinus tract 08/27/2019  . Open wound of chest wall with complication, right, sequela 08/20/2019  . Wound check, abscess 08/06/2019  .  Neuropathic pain of chest 07/23/2019  . Wound with infection determined by examination 07/17/2019  . Open wound of chest wall 07/09/2019  . Abscess of parasternal chest wall 07/02/2019  . Osteomyelitis (HCC) 06/20/2019  . MSSA (methicillin susceptible Staphylococcus aureus) infection 06/20/2019  . Sternoclavicular (joint) (ligament) sprain 06/18/2019  . Sternal osteomyelitis (HCC) 06/18/2019  . Septic arthritis of knee, right (HCC) 05/31/2019  . Myofascitis 05/31/2019  . Prediabetes 05/31/2019  . MSSA bacteremia 05/31/2019  . Septic arthritis of right sternoclavicular joint (HCC) 05/30/2019  . Rheumatoid arthritis (HCC) 05/30/2019  . Glucosuria 05/30/2019  . 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 Difficulty controlling anger 03/19/2019  . PTSD (post-traumatic stress disorder) 11/01/2017    Past Surgical History:  Procedure Laterality Date  . APPENDECTOMY    . APPLICATION OF WOUND VAC Right 06/20/2019   Procedure: APPLICATION OF WOUND VAC;  Surgeon: [name], MD;  Location: [facility] OR;  Service: Vascular;  Laterality: Right;  . APPLICATION OF WOUND VAC Right 06/23/2019   Procedure: APPLICATION OF WOUND VAC;  Surgeon: [name], MD;  Location: [facility] OR;  Service: Thoracic;  Laterality: Right;  . COLONOSCOPY     x 2  . I & D EXTREMITY Right 05/31/2019   Procedure: IRRIGATION AND DEBRIDEMENT LOWER EXTREMITY;  Surgeon: Tarry Kos, MD;  Location: [facility] OR;  Service: Orthopedics;  Laterality: Right;  . I & D EXTREMITY Right 06/20/2019   Procedure: DEBRIDEMENT OF STERNOCLAVICULAR JOINT  ABSCESS;  Surgeon: [name], MD;  Location: [facility] OR;  Service: Vascular;  Laterality: Right;  . INCISION AND DRAINAGE OF WOUND Right 04/12/2020   Procedure: excision of right sternoclavicular joint infection;  Surgeon: Peggye Form, DO;  Location: [facility];  Service: Plastics;  Laterality: Right;  45 min total  . STERNAL WOUND DEBRIDEMENT Right 09/19/2019   Procedure: EXCISIONAL  DEBRIDEMENT AND IRRIGATION OF RIGHT STERNOCLAVICULAR JOINT WOUND;  Surgeon: [name], MD;  Location: [facility] OR;  Service: Thoracic;  Laterality: Right;  . TEE WITHOUT CARDIOVERSION N/A 06/05/2019   Procedure: TRANSESOPHAGEAL ECHOCARDIOGRAM (TEE);  Surgeon: Lewayne Bunting, MD;  Location: [facility] ENDOSCOPY;  Service: Cardiovascular;  Laterality: N/A;  . WOUND EXPLORATION Right 06/23/2019   Procedure: irrigation and clean out right shoulder;  Surgeon: [name], MD;  Location: [facility] OR;  Service: Thoracic;  Laterality: Right;       Family History  Problem Relation Age of Onset  . Alcohol abuse Mother   . Depression Mother   . Drug abuse Mother   . Physical abuse Mother   . Sexual abuse Mother   . ADD / ADHD Paternal Aunt   . Alcohol abuse Maternal Grandfather   . Bipolar disorder Maternal Grandmother   . Schizophrenia Maternal Grandmother   . Drug abuse Maternal Grandmother   . Alcohol abuse Paternal Grandfather   . Depression Paternal Grandfather   . Depression Paternal Grandmother   . Drug abuse Paternal Grandmother     Social History   Tobacco Use  . Smoking status: Current Every Day Smoker    Packs/day: 0.50    Years: 15.00    Pack years: 7.50    Types: Cigarettes  . Smokeless tobacco: Never Used  Vaping Use  . Vaping Use: Never used  Substance Use Topics  . Alcohol use: Not Currently    Comment: rare beer  . Drug use: Yes    Types: Cocaine    Comment: cough med    Home Medications Prior to Admission medications   Medication Sig Start Date End Date Taking? Authorizing Provider  atorvastatin (LIPITOR) 10 MG tablet Take 10 mg by mouth at bedtime.  09/25/17   [provider]  Cholecalciferol (VITAMIN D3) 50 MCG (2000 UT) TABS Take 2,000 mg by mouth daily.    [provider]  cloNIDine (CATAPRES) 0.2 MG tablet Take 1 tablet (0.2 mg total) by mouth 3 (three) times daily. 06/28/20 12/25/20  Neysa Hotter, MD  doxycycline (VIBRAMYCIN) 100 MG capsule  Take 100 mg by mouth 2 (two) times daily. 06/16/20   [provider]  folic acid (FOLVITE) 1 MG tablet Take 1 mg by mouth daily. 06/10/20   [provider]  lisinopril (PRINIVIL,ZESTRIL) 10 MG tablet Take 10 mg by mouth daily.    [provider]  lithium carbonate (ESKALITH) 450 MG CR tablet Take 1 tablet (450 mg total) by mouth at bedtime. Take with 300 mg to equal 750 mg at bedtime 06/28/20 09/26/20  Neysa Hotter, MD  metroNIDAZOLE (FLAGYL) 500 MG tablet Take 500 mg by mouth 3 (three) times daily.    [provider]  Multiple Vitamin (MULTIVITAMIN WITH MINERALS) TABS tablet Take 1 tablet by mouth daily.    [provider]  omeprazole (PRILOSEC) 40 MG capsule Take 1 capsule by mouth every morning. 05/25/20   [provider]  risperidone (RISPERDAL) 4 MG tablet Take 1 tablet (4 mg total) by mouth at bedtime. 06/28/20 12/25/20  Hisada,  Barbee Cough, MD  sertraline (ZOLOFT) 100 MG tablet Take 2 tablets (200 mg total) by mouth daily. 06/28/20 09/26/20  Neysa Hotter, MD  sertraline (ZOLOFT) 50 MG tablet  05/12/19   [provider]  traZODone (DESYREL) 100 MG tablet Take 2 tablets (200 mg total) by mouth at bedtime. 06/28/20 12/25/20  Neysa Hotter, MD  zinc gluconate 50 MG tablet Take 50 mg by mouth daily.    [provider]    Allergies    Patient has no known allergies.  Review of Systems   Review of Systems  Unable to perform ROS: Mental status change    Physical Exam Updated Vital Signs BP 99/65   Pulse 97   Temp 98.7 F (37.1 C) (Oral)   Resp 16   Ht 5\' 10"  (1.778 m)   Wt 113.4 kg   SpO2 94%   BMI 35.87 kg/m   Physical Exam Vitals and nursing note reviewed.  Constitutional:      General: He is in acute distress.     Appearance: He is obese. He is ill-appearing and diaphoretic.  HENT:     Head: Normocephalic and atraumatic.     Nose: No congestion.     Mouth/Throat:     Mouth: Mucous membranes are moist.     Pharynx:  Oropharynx is clear. No oropharyngeal exudate or posterior oropharyngeal erythema.  Eyes:     Extraocular Movements: Extraocular movements intact.     Conjunctiva/sclera: Conjunctivae normal.     Pupils: Pupils are equal, round, and reactive to light.  Cardiovascular:     Rate and Rhythm: Regular rhythm. Tachycardia present.     Pulses: Normal pulses.     Heart sounds: No murmur heard. No friction rub. No gallop.   Pulmonary:     Effort: No respiratory distress.     Breath sounds: No wheezing, rhonchi or rales.  Abdominal:     Palpations: Abdomen is soft.     Tenderness: There is no abdominal tenderness.  Musculoskeletal:     Right lower leg: No edema.     Left lower leg: No edema.  Skin:    General: Skin is warm.  Neurological:     Mental Status: He is alert.     GCS: GCS eye subscore is 4. GCS verbal subscore is 5. GCS motor subscore is 6.     Cranial Nerves: No facial asymmetry.     Comments: Cranial nerves II through XII are grossly intact  Psychiatric:        Mood and Affect: Mood normal.     ED Results / Procedures / Treatments   Labs (all labs ordered are listed, but only abnormal results are displayed) Labs Reviewed  COMPREHENSIVE METABOLIC PANEL - Abnormal; Notable for the following components:      Result Value   CO2 16 (*)    Glucose, Bld 176 (*)    Creatinine, Ser 1.43 (*)    Anion gap 18 (*)    All other components within normal limits  CBC WITH DIFFERENTIAL/PLATELET - Abnormal; Notable for the following components:   WBC 21.6 (*)    MCV 75.2 (*)    MCH 23.6 (*)    RDW 18.8 (*)    Platelets 404 (*)    Neutro Abs 18.4 (*)    Abs Immature Granulocytes 0.14 (*)    All other components within normal limits  URINALYSIS, ROUTINE W REFLEX MICROSCOPIC - Abnormal; Notable for the following components:   APPearance HAZY (*)  Protein, ur 100 (*)    All other components within normal limits  RAPID URINE DRUG SCREEN, HOSP PERFORMED - Abnormal; Notable for the  following components:   Opiates POSITIVE (*)    All other components within normal limits  ACETAMINOPHEN LEVEL - Abnormal; Notable for the following components:   Acetaminophen (Tylenol), Serum <10 (*)    All other components within normal limits  LACTIC ACID, PLASMA  CK  ETHANOL  TROPONIN I (HIGH SENSITIVITY)    EKG EKG Interpretation  Date/Time:  Monday July 19 2020 17:13:30 EDT Ventricular Rate:  166 PR Interval:  123 QRS Duration: 102 QT Interval:  339 QTC Calculation: 564 R Axis:   135 Text Interpretation: Sinus tachycardia Consider right atrial enlargement Consider right ventricular hypertrophy Probable inferior infarct, old Prolonged QT interval Baseline wander in lead(s) I II aVR aVF V6 Abnormal ECG Confirmed by Gerhard Munch [phone]) on 07/19/2020 7:22:54 PM   Radiology DG Chest 2 View  Result Date: 07/19/2020 CLINICAL DATA:  Altered mental status. EXAM: CHEST - 2 VIEW COMPARISON:  September 19, 2019. FINDINGS: Cardiomediastinal silhouette mildly accentuated by AP technique. No consolidation. No visible pleural effusions or pneumothorax. Eroded medial right clavicle, similar in appearance to priors. IMPRESSION: 1. No evidence of active cardiopulmonary disease. 2. Eroded medial right clavicle, similar in appearance to priors. Electronically Signed   By: Feliberto Harts MD   On: 07/19/2020 21:02   CT Head Wo Contrast  Result Date: 07/19/2020 CLINICAL DATA:  Altered mental status.  Patient found down EXAM: CT HEAD WITHOUT CONTRAST TECHNIQUE: Contiguous axial images were obtained from the base of the skull through the vertex without intravenous contrast. COMPARISON:  None. FINDINGS: Brain: No evidence of acute infarction, hemorrhage, hydrocephalus, extra-axial collection or mass lesion/mass effect. Vascular: No hyperdense vessel or unexpected calcification. Skull: Normal. Negative for fracture or focal lesion. Sinuses/Orbits: Left maxillary sinus opacification. Remaining paranasal  sinuses and mastoid air cells are clear. Orbital structures unremarkable. Other: Negative for scalp hematoma. IMPRESSION: 1. No acute intracranial findings. 2. Left maxillary sinus disease. Electronically Signed   By: Duanne Guess D.O.   On: 07/19/2020 18:38    Procedures Procedures   Medications Ordered in ED Medications  sodium chloride 0.9 % bolus 1,000 mL (0 mLs Intravenous Stopped 07/19/20 1906)  sodium chloride 0.9 % bolus 1,000 mL (0 mLs Intravenous Stopped 07/19/20 2018)  LORazepam (ATIVAN) injection 2 mg (2 mg Intravenous Given 07/19/20 1720)  diphenhydrAMINE (BENADRYL) injection 50 mg (50 mg Intravenous Given 07/19/20 1720)  haloperidol lactate (HALDOL) injection 5 mg (5 mg Intravenous Given 07/19/20 1720)    ED Course  I have reviewed the triage vital signs and the nursing notes.  Pertinent labs & imaging results that were available during my care of the patient were reviewed by me and considered in my medical decision making (see chart for details).    MDM Rules/Calculators/A&P                         Initial impression-patient presents with altered mental status, he was alert, agitated, uncooperative, vital signs significant for tachycardia in the 160s.  Concern for delirium, due to patient's safety and safety of staff will apply safe restraints and B-52 the patient.  Will obtain lab work-up, and contact poison control for further recommendations.  Work-up-CBC shows leukocytosis of 20.6, CMP shows CO2 of 16, hyperglycemia 176, creatinine 1.43, CK1 4 0, acetaminophen less than 10, first troponin less than 2, ethanol less  than 10, lactic 1.8, rapid urine drug screen was positive for opiates, UA negative for nitrates, leukocytes, hematuria.  head CT is negative for acute findings.  Chest x-ray negative for acute findings.  Reassessment-patient reassessed after B-52 cocktail and he is calm down significantly, he is no longer resisting the restraints.  Vital signs have  improved.  Patient was given fluids, continues to improve, he is more alert and oriented, he is answering all my questions, he has no complaints this time, he denies suicidal or homicidal ideations.  Patient's heart rate continues to remain in the 120s, this appears to be at his baseline.  Patient was reassessed after 2 L of fluids, heart rate has improved, patient has no complaints this time, patient agreed for discharge.  Consult spoke with Nonie Hoyer and she recommends benzos and fluids for heart rate control, repeat EKG every 4 hours, patient can be discharged home once he is back to his baseline and is nontachycardic.  Rule out-I have low suspicion for rhabdo as CK is within normal limits.  Low suspicion for intracranial head bleed or CVA as there is no neurodeficits present on exam, CT head is negative for acute findings.  Low suspicion for meningitis as there is no meningeal sign present on exam.  He is noted to be tachycardic and have a leukocytosis of 21.6 but heart rate has improved after 2 L of fluids, and I suspect elevated leukocytosis secondary due to acute phase reactant from substance abuse, patient also has a normal lactic.  Low suspicion for ACS as patient has chest pain, shortness of breath, EKG is without signs of ischemia, initial troponin is 2.  There is no the patient had a CO2 of 16 and anion gap of 18 I suspect this is from dehydration as he was tachycardic initially on exam but after 2 L of fluids this has since resolved.  We will have him continue to hydrate at home.  Plan-  1.  Altered mental status since resolved-suspect this secondary due to polysubstance abuse from over-the-counter medications.  Will provide patient with outpatient resources and continues to rehydrate at home.  Vital signs have remained stable, no indication for hospital admission.  Patient discussed with attending and they agreed with assessment and plan.  Patient given at home care as well strict  return precautions.  Patient verbalized that they understood agreed to said plan.   Final Clinical Impression(s) / ED Diagnoses Final diagnoses:  Accidental drug overdose, initial encounter    Rx / DC Orders ED Discharge Orders    None       Barnie Del 07/19/20 2127    Gerhard Munch, MD 07/19/20 2324

## 2020-07-20 ENCOUNTER — Telehealth: Payer: Self-pay

## 2020-07-20 NOTE — Telephone Encounter (Signed)
pt mother called wanted dr. Vanetta Shawl and  christian that he overdosed on medication and is in [facility] in [place],

## 2020-07-20 NOTE — Telephone Encounter (Signed)
As per Dr.Hisada's notes next appointment is in July. Please let patient know to schedule a sooner appointment. Please ask to contact Dr.Hisada once she is back in office for further recommendations.

## 2020-07-25 NOTE — Telephone Encounter (Signed)
Thanks for the notification. Will discuss this at his next appointment.

## 2020-07-26 ENCOUNTER — Other Ambulatory Visit: Payer: Self-pay

## 2020-07-26 ENCOUNTER — Ambulatory Visit (INDEPENDENT_AMBULATORY_CARE_PROVIDER_SITE_OTHER): Payer: Medicaid Other | Admitting: Licensed Clinical Social Worker

## 2020-07-26 DIAGNOSIS — Z5329 Procedure and treatment not carried out because of patient's decision for other reasons: Secondary | ICD-10-CM

## 2020-07-26 NOTE — Progress Notes (Signed)
LCSW counselor tried to connect with patient for scheduled appointment via MyChart video text request x 2 and email request; also tried to connect via phone and had a conversation with Mr. Eichelberger.  Mr. Seres reports that he forgot about the appointment and that he wanted to reschedule the appointment "So I can give you a break". LCSW clinician informed pt that the goal is to provide the services that the patient was scheduled for and not take breaks.  Offered to continue appointment via telephone--pt stated that he wants to reschedule the appt anyway. RSD for 7/22@11 .  Weyman Pedro, MSW, LCSW Outpatient Therapist/Triage Specialist

## 2020-07-29 ENCOUNTER — Inpatient Hospital Stay: Admission: RE | Admit: 2020-07-29 | Payer: BC Managed Care – PPO | Source: Ambulatory Visit
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
## 2020-08-04 ENCOUNTER — Encounter: Payer: Self-pay | Admitting: Psychiatry

## 2020-08-04 ENCOUNTER — Telehealth (INDEPENDENT_AMBULATORY_CARE_PROVIDER_SITE_OTHER): Payer: Medicaid Other | Admitting: Psychiatry

## 2020-08-04 ENCOUNTER — Other Ambulatory Visit: Payer: Self-pay

## 2020-08-04 DIAGNOSIS — F4310 Post-traumatic stress disorder, unspecified: Secondary | ICD-10-CM | POA: Diagnosis not present

## 2020-08-04 DIAGNOSIS — F331 Major depressive disorder, recurrent, moderate: Secondary | ICD-10-CM | POA: Diagnosis not present

## 2020-08-04 DIAGNOSIS — G4700 Insomnia, unspecified: Secondary | ICD-10-CM

## 2020-08-04 DIAGNOSIS — F6381 Intermittent explosive disorder: Secondary | ICD-10-CM
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
## 2020-08-23 ENCOUNTER — Ambulatory Visit: Payer: BC Managed Care – PPO | Admitting: Psychiatry

## 2020-08-25 ENCOUNTER — Telehealth: Payer: Self-pay | Admitting: Psychiatry

## 2020-08-25 ENCOUNTER — Telehealth: Payer: Self-pay
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
## 2020-08-25 NOTE — Telephone Encounter (Signed)
Contacted with his mother again due to the police not being able to enter the house.  Herbert Walsh was able to talk on the phone. He states that he was sleeping during the day. Although he admits overusing cough syrup lately,  he denies SI. He does not want to come to the hospital, although he does not elaborate the reason.   His mother state that she will try to bring him to the hospital for evaluation. And even if not, he has an appointment tomorrow with this clinician, and she will update the situation.   He does not meet criteria for petition (by this clinician) at this time, although he is at chronically elevated risk of self harm due to ongoing cough syrup abuse.

## 2020-08-25 NOTE — Telephone Encounter (Signed)
pt mother called states that he is having a hard time she states that he overdosed and he not doing well. I made him an appt for tomorrow  and i advised her to take him to the er. she stated that they dont do anything.  I advised her to bring hm to [place] or to [facility]. She states that he refuses and that she going to try and get him to go.  She states that the police in [place] and [employer] will not do anything.  She wants to speak with you about trying to admit or sending him somewhere.

## 2020-08-26 ENCOUNTER — Other Ambulatory Visit: Payer: Self-pay

## 2020-08-26 ENCOUNTER — Encounter: Payer: Self-pay | Admitting: Psychiatry

## 2020-08-26 ENCOUNTER — Telehealth (INDEPENDENT_AMBULATORY_CARE_PROVIDER_SITE_OTHER): Payer: Medicaid Other | Admitting: Psychiatry

## 2020-08-26 DIAGNOSIS — F331 Major depressive disorder, recurrent, moderate: Secondary | ICD-10-CM | POA: Diagnosis not present

## 2020-08-26 DIAGNOSIS — G4700 Insomnia, unspecified: Secondary | ICD-10-CM

## 2020-08-26 DIAGNOSIS — F4310 Post-traumatic stress disorder, unspecified: Secondary | ICD-10-CM

## 2020-08-26 DIAGNOSIS — F1990 Other psychoactive substance use, unspecified, uncomplicated: Secondary | ICD-10-CM

## 2020-08-26 DIAGNOSIS — F6381 Intermittent explosive disorder: Secondary | ICD-10-CM | POA: Diagnosis not present

## 2020-08-26 MED ORDER — NALTREXONE HCL 50 MG PO TABS: 25.0000 mg | ORAL_TABLET | Freq: Every day | ORAL | 0 refills | Status: DC
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
## 2020-08-26 NOTE — Progress Notes (Signed)
Reviewed recent events noted in chart with Dr Stephannie Peters, Pt will need to be medically optimized and stable prior to upcoming surgery. Spoke with Eunice Blase at Dr Warren Danes office and let her know.

## 2020-08-26 NOTE — Patient Instructions (Signed)
1. Continue sertraline 200 mg daily  2. Continue risperidone 4 mg at night  3. Continue lithium 450 mg at night  4. Continue clonidine 0.2 mg three times a day  6. Continue Trazodone 200 mg daily 7. Start naltrexone 25 mg at night 7. Next appointment:  7/28 at 10 AM

## 2020-09-01 ENCOUNTER — Ambulatory Visit (HOSPITAL_BASED_OUTPATIENT_CLINIC_OR_DEPARTMENT_OTHER): Admission: RE | Admit: 2020-09-01 | Payer: Medicaid Other | Source: Home / Self Care | Admitting: Orthopaedic Surgery

## 2020-09-01 ENCOUNTER — Encounter (HOSPITAL_BASED_OUTPATIENT_CLINIC_OR_DEPARTMENT_OTHER): Admission: RE | Payer: Self-pay | Source: Home / Self Care

## 2020-09-01 SURGERY — EXCISION MASS UPPER EXTREMITIES
Anesthesia: General | Site: Elbow | Laterality: Right

## 2020-09-03 ENCOUNTER — Ambulatory Visit: Payer: Medicaid Other | Admitting: Licensed Clinical Social Worker
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
## 2020-09-08 ENCOUNTER — Encounter: Payer: Medicaid Other | Admitting: Orthopaedic Surgery

## 2020-09-09 ENCOUNTER — Other Ambulatory Visit: Payer: Self-pay

## 2020-09-09 ENCOUNTER — Telehealth (INDEPENDENT_AMBULATORY_CARE_PROVIDER_SITE_OTHER): Payer: Medicaid Other | Admitting: Psychiatry

## 2020-09-09 ENCOUNTER — Encounter: Payer: Self-pay | Admitting: Psychiatry

## 2020-09-09 DIAGNOSIS — F331 Major depressive disorder, recurrent, moderate: Secondary | ICD-10-CM | POA: Diagnosis not present

## 2020-09-09 DIAGNOSIS — F6381 Intermittent explosive disorder: Secondary | ICD-10-CM | POA: Diagnosis not present

## 2020-09-09 DIAGNOSIS — F4310 Post-traumatic stress disorder, unspecified: Secondary | ICD-10-CM

## 2020-09-09 MED ORDER — LITHIUM CARBONATE ER 450 MG PO TBCR: 450.0000 mg | EXTENDED_RELEASE_TABLET | Freq: Every day | ORAL | 0 refills | Status: DC

## 2020-09-09 MED ORDER — NALTREXONE HCL 50 MG PO TABS: 25.0000 mg | ORAL_TABLET | Freq: Every day | ORAL | 0 refills | Status: AC

## 2020-09-09 MED ORDER — SERTRALINE HCL 100 MG PO TABS: 200.0000 mg | ORAL_TABLET | Freq: Every day | ORAL | 1 refills | Status: DC

## 2020-09-09 NOTE — Patient Instructions (Signed)
1. Continue sertraline 200 mg daily  2. Continue risperidone 4 mg at night  3. Continue lithium 450 mg at night  4. Continue clonidine 0.2 mg three times a day  6. Continue Trazodone 200 mg daily 7. Continue naltrexone 25 mg at night 7. Next appointment:  9/2 at 10:30

## 2020-09-15 ENCOUNTER — Other Ambulatory Visit: Payer: Self-pay

## 2020-09-15 ENCOUNTER — Ambulatory Visit (INDEPENDENT_AMBULATORY_CARE_PROVIDER_SITE_OTHER): Payer: Medicaid Other | Admitting: Licensed Clinical Social Worker

## 2020-09-15 DIAGNOSIS — F4310 Post-traumatic stress disorder, unspecified: Secondary | ICD-10-CM | POA: Diagnosis not present
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
## 2020-10-06 ENCOUNTER — Encounter (HOSPITAL_COMMUNITY): Payer: Self-pay

## 2020-10-06 ENCOUNTER — Emergency Department (HOSPITAL_COMMUNITY)
Admission: EM | Admit: 2020-10-06 | Discharge: 2020-10-06 | Disposition: A | Discharge status: Home / Self Care | Payer: Medicaid Other | Attending: Emergency Medicine | Admitting: Emergency Medicine

## 2020-10-06 ENCOUNTER — Other Ambulatory Visit: Payer: Self-pay

## 2020-10-06 ENCOUNTER — Emergency Department (HOSPITAL_COMMUNITY): Payer: Medicaid Other

## 2020-10-06 DIAGNOSIS — M25551 Pain in right hip: Secondary | ICD-10-CM | POA: Insufficient documentation

## 2020-10-06 DIAGNOSIS — E669 Obesity, unspecified: Secondary | ICD-10-CM | POA: Insufficient documentation

## 2020-10-06 DIAGNOSIS — M79651 Pain in right thigh: Secondary | ICD-10-CM | POA: Insufficient documentation

## 2020-10-06 DIAGNOSIS — F17210 Nicotine dependence, cigarettes, uncomplicated: Secondary | ICD-10-CM | POA: Diagnosis not present

## 2020-10-06 DIAGNOSIS — Z111 Encounter for screening for respiratory tuberculosis: Secondary | ICD-10-CM | POA: Insufficient documentation

## 2020-10-06 DIAGNOSIS — Z0289 Encounter for other administrative examinations: Secondary | ICD-10-CM | POA: Insufficient documentation

## 2020-10-06 DIAGNOSIS — Z8616 Personal history of COVID-19: Secondary | ICD-10-CM | POA: Diagnosis not present

## 2020-10-06 DIAGNOSIS — L0233 Carbuncle of buttock: Secondary | ICD-10-CM | POA: Insufficient documentation

## 2020-10-06 DIAGNOSIS — J45909 Unspecified asthma, uncomplicated: Secondary | ICD-10-CM | POA: Diagnosis not present

## 2020-10-06 DIAGNOSIS — I10 Essential (primary) hypertension: Secondary | ICD-10-CM | POA: Insufficient documentation

## 2020-10-06 DIAGNOSIS — Z79899 Other long term (current) drug therapy: Secondary | ICD-10-CM | POA: Diagnosis not present

## 2020-10-06 DIAGNOSIS — J069 Acute upper respiratory infection, unspecified: Secondary | ICD-10-CM | POA: Insufficient documentation

## 2020-10-06 DIAGNOSIS — Z460 Encounter for fitting and adjustment of spectacles and contact lenses: Secondary | ICD-10-CM | POA: Insufficient documentation

## 2020-10-06 DIAGNOSIS — H5210 Myopia, unspecified eye: Secondary | ICD-10-CM | POA: Insufficient documentation

## 2020-10-06 DIAGNOSIS — J029 Acute pharyngitis, unspecified: Secondary | ICD-10-CM | POA: Insufficient documentation

## 2020-10-06 DIAGNOSIS — S8000XA Contusion of unspecified knee, initial encounter: Secondary | ICD-10-CM | POA: Insufficient documentation

## 2020-10-06 HISTORY — DX: Other psychoactive substance use, unspecified, uncomplicated: F19.90

## 2020-10-06 IMAGING — DX DG LUMBAR SPINE COMPLETE 4+V
5 series · 5 of 5 positions shown · non-contrast
Comparison: CT Abdomen and Pelvis [DATE]. Normal lumbar
segmentation.

CLINICAL DATA: 30-year-old male with pain radiating to the right
leg for 2 months. No known injury.

EXAM:
LUMBAR SPINE - COMPLETE 4+ VIEW

[l-spine ap]
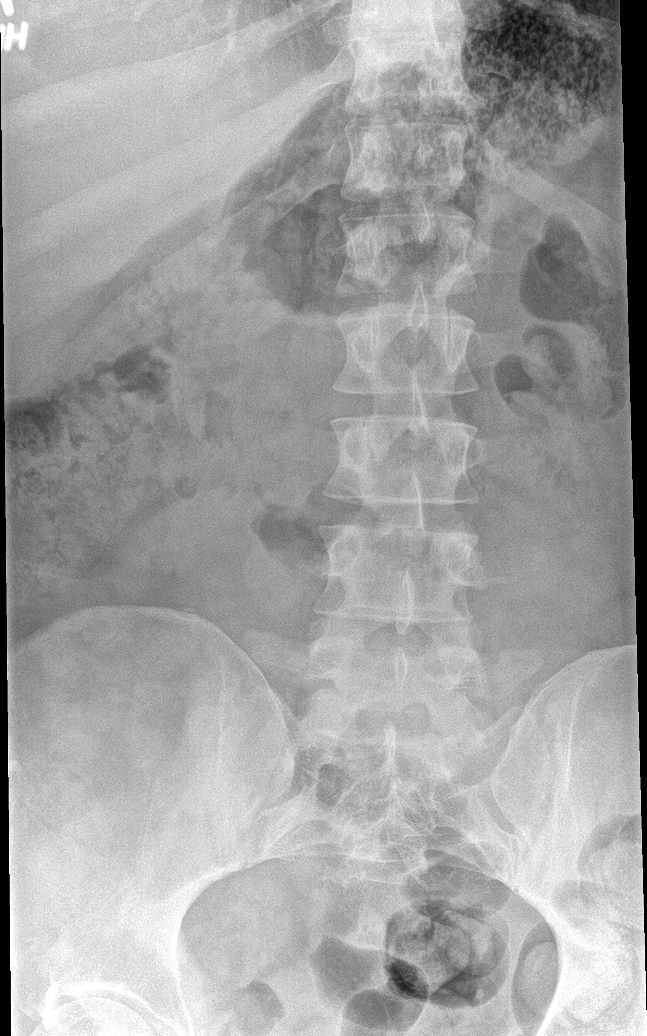

[l-spine obl (1 of 2)]
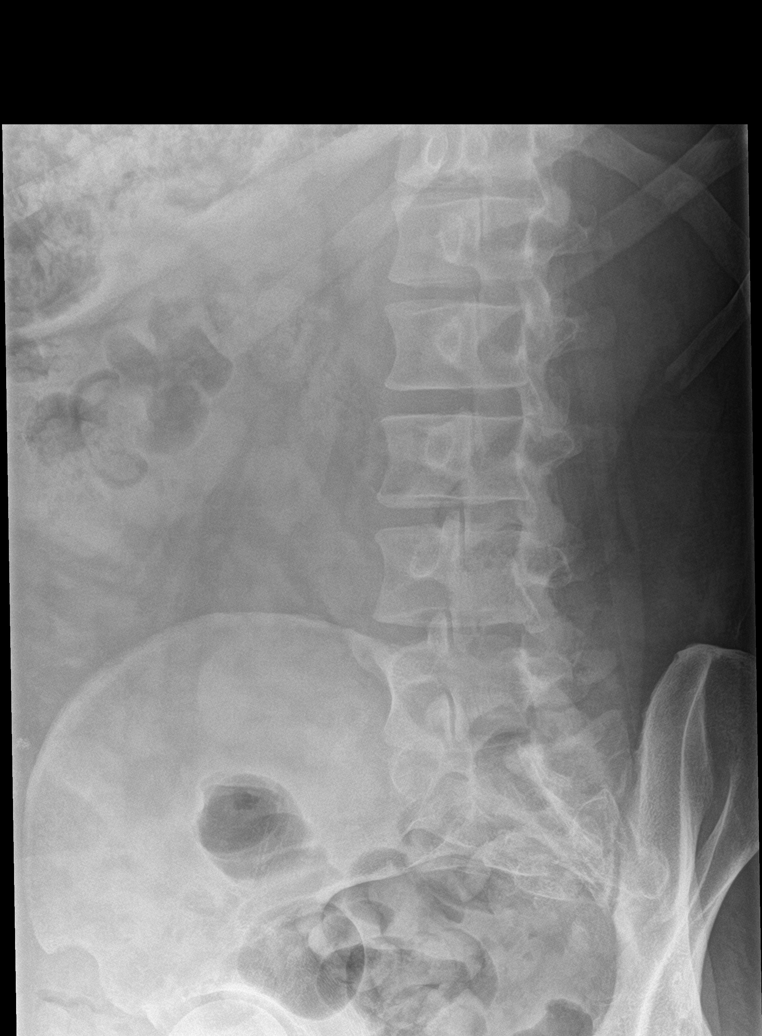

[l-spine obl (2 of 2)]
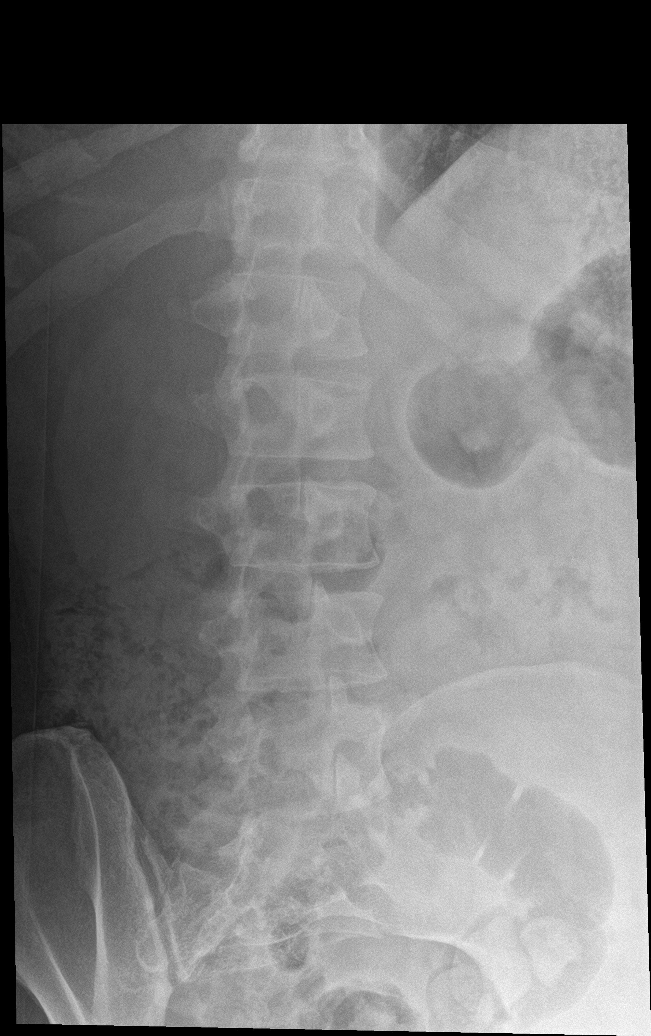

[l-spine lat]
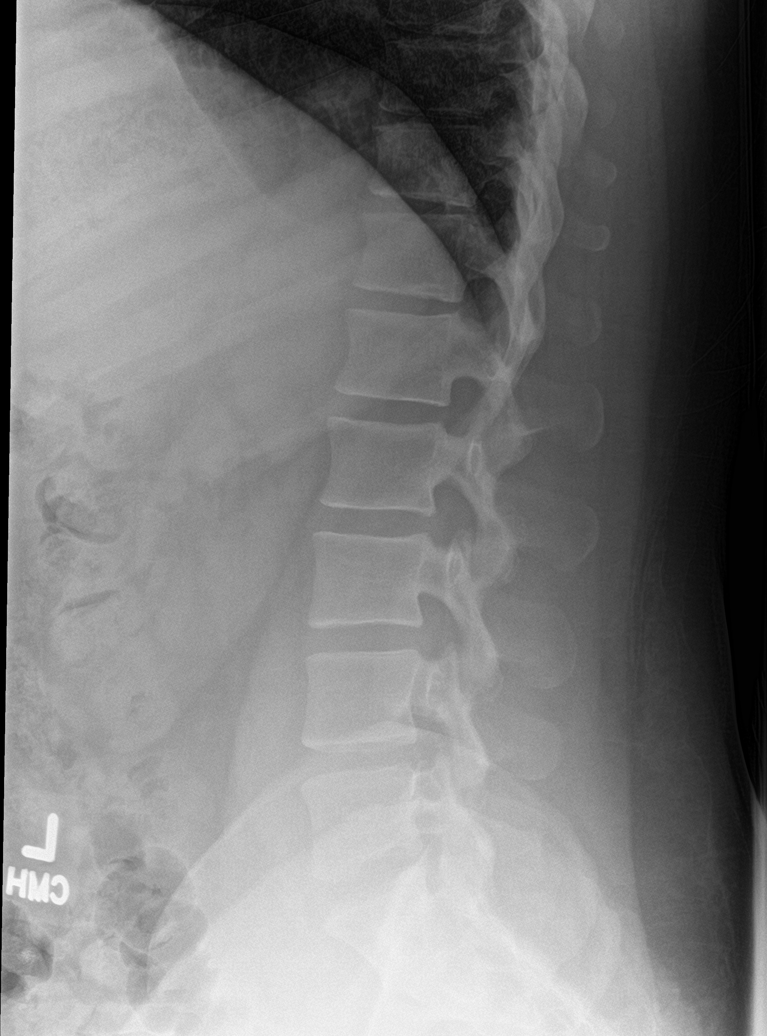

[l-spine spot]
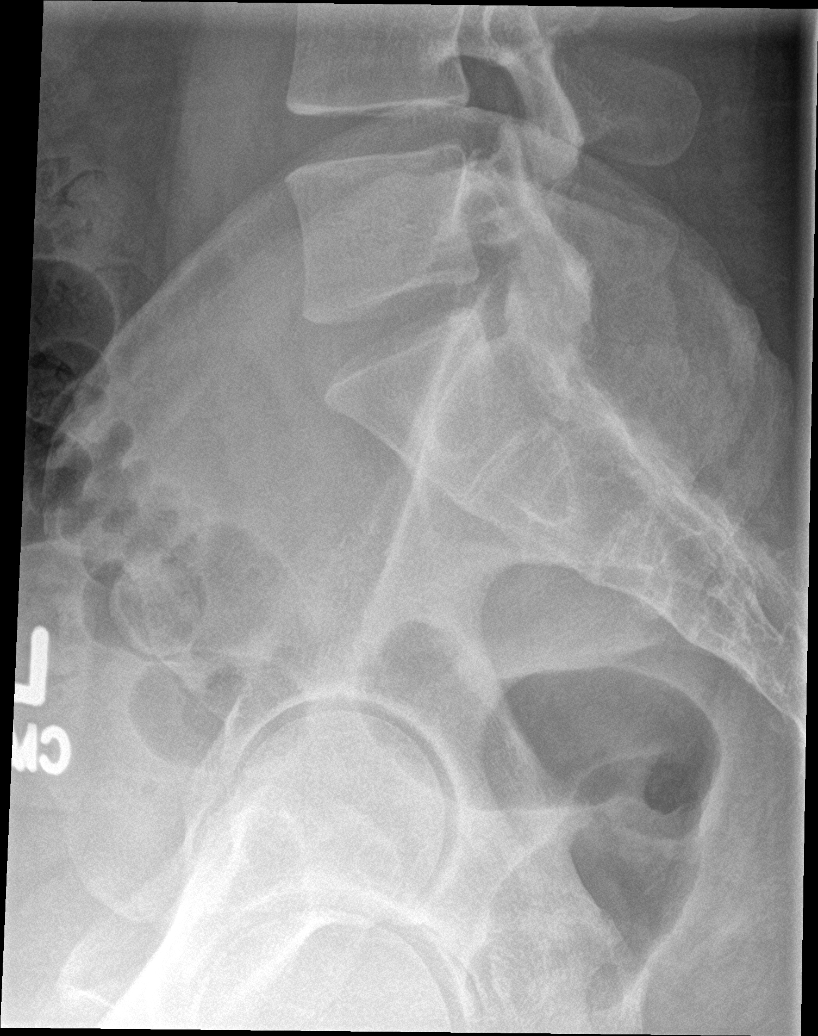

[5 of 5 positions shown; findings below may reference images not displayed]

FINDINGS: Chronic straightening of lumbar lordosis is stable since [WA] along
with subtle retrolisthesis of L5 on S1. No pars fracture. No acute
osseous abnormality identified. Bone mineralization is within normal
limits. Visible sacrum and SI joints appear intact. Preserved disc
spaces. Negative visible abdominal and pelvic visceral contours.
IMPRESSION: Stable since [WA] and essentially negative radiographic appearance
of the lumbar spine.

## 2020-10-06 IMAGING — DX DG HIP (WITH OR WITHOUT PELVIS) 2-3V*R*
3 series · 3 of 3 positions shown · non-contrast
Comparison: CT Abdomen and Pelvis [DATE].

CLINICAL DATA: 30-year-old male with pain radiating to the right
leg for 2 months. No known injury.

EXAM:
DG HIP (WITH OR WITHOUT PELVIS) 2-3V RIGHT

[pelvis ap]
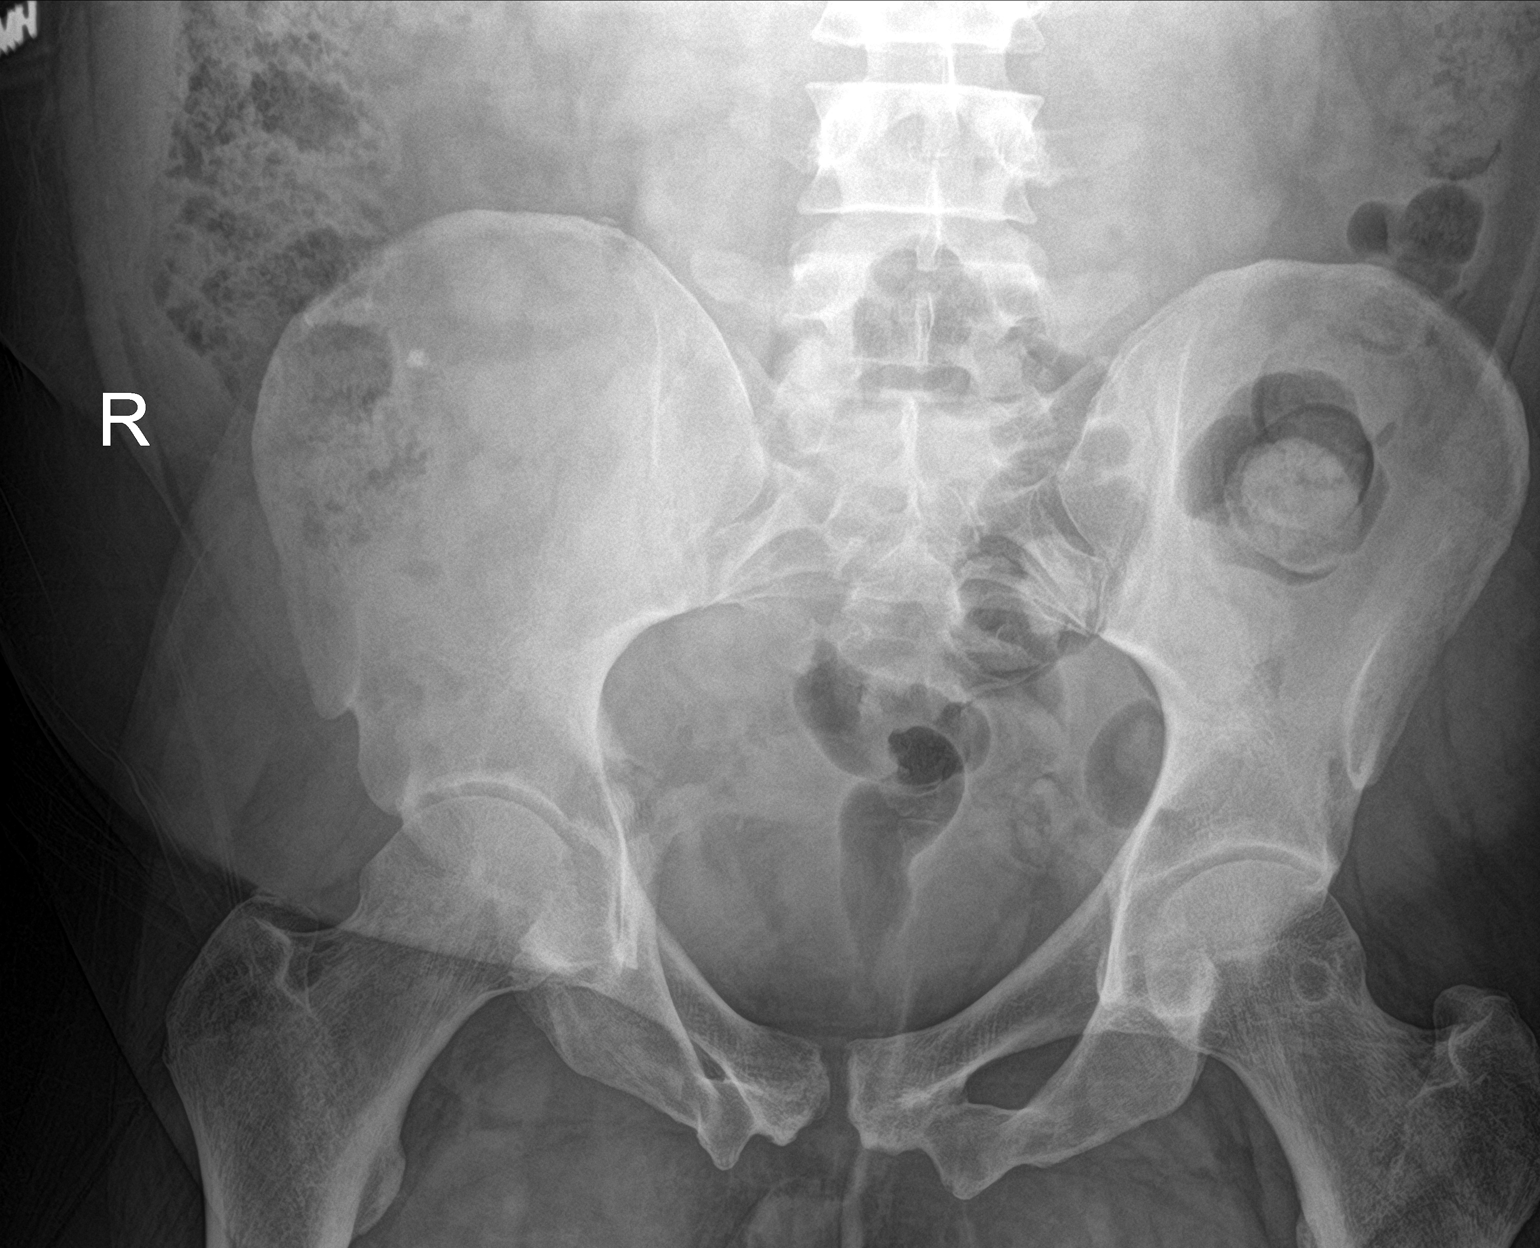

[hip ap]
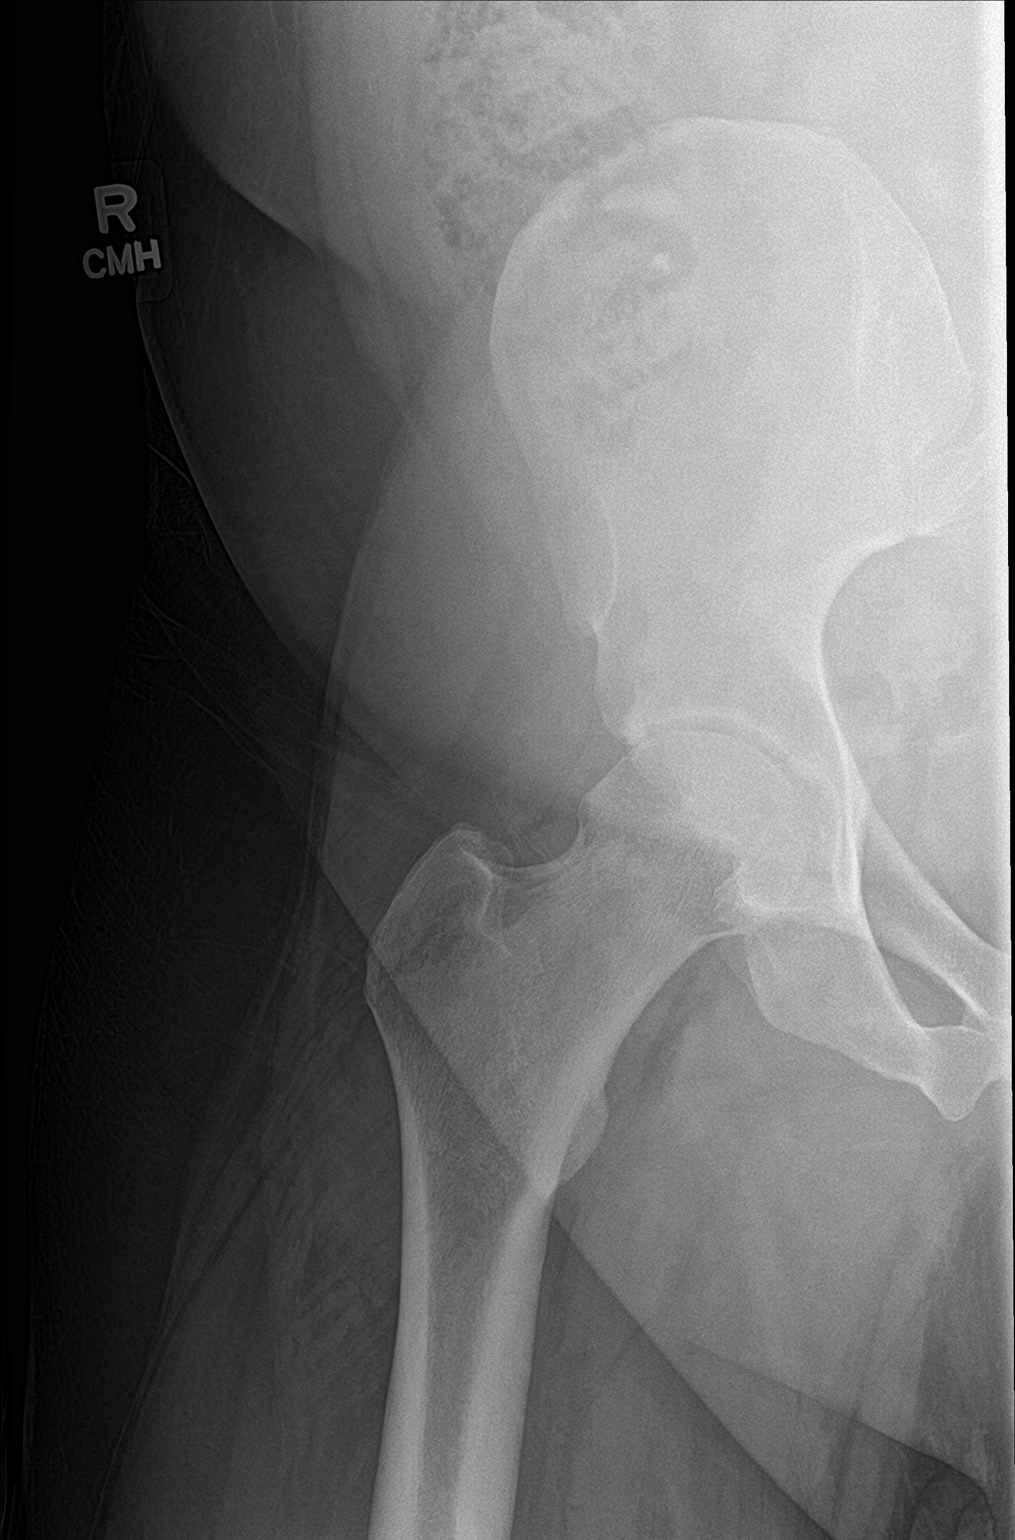

[hip frog leg]
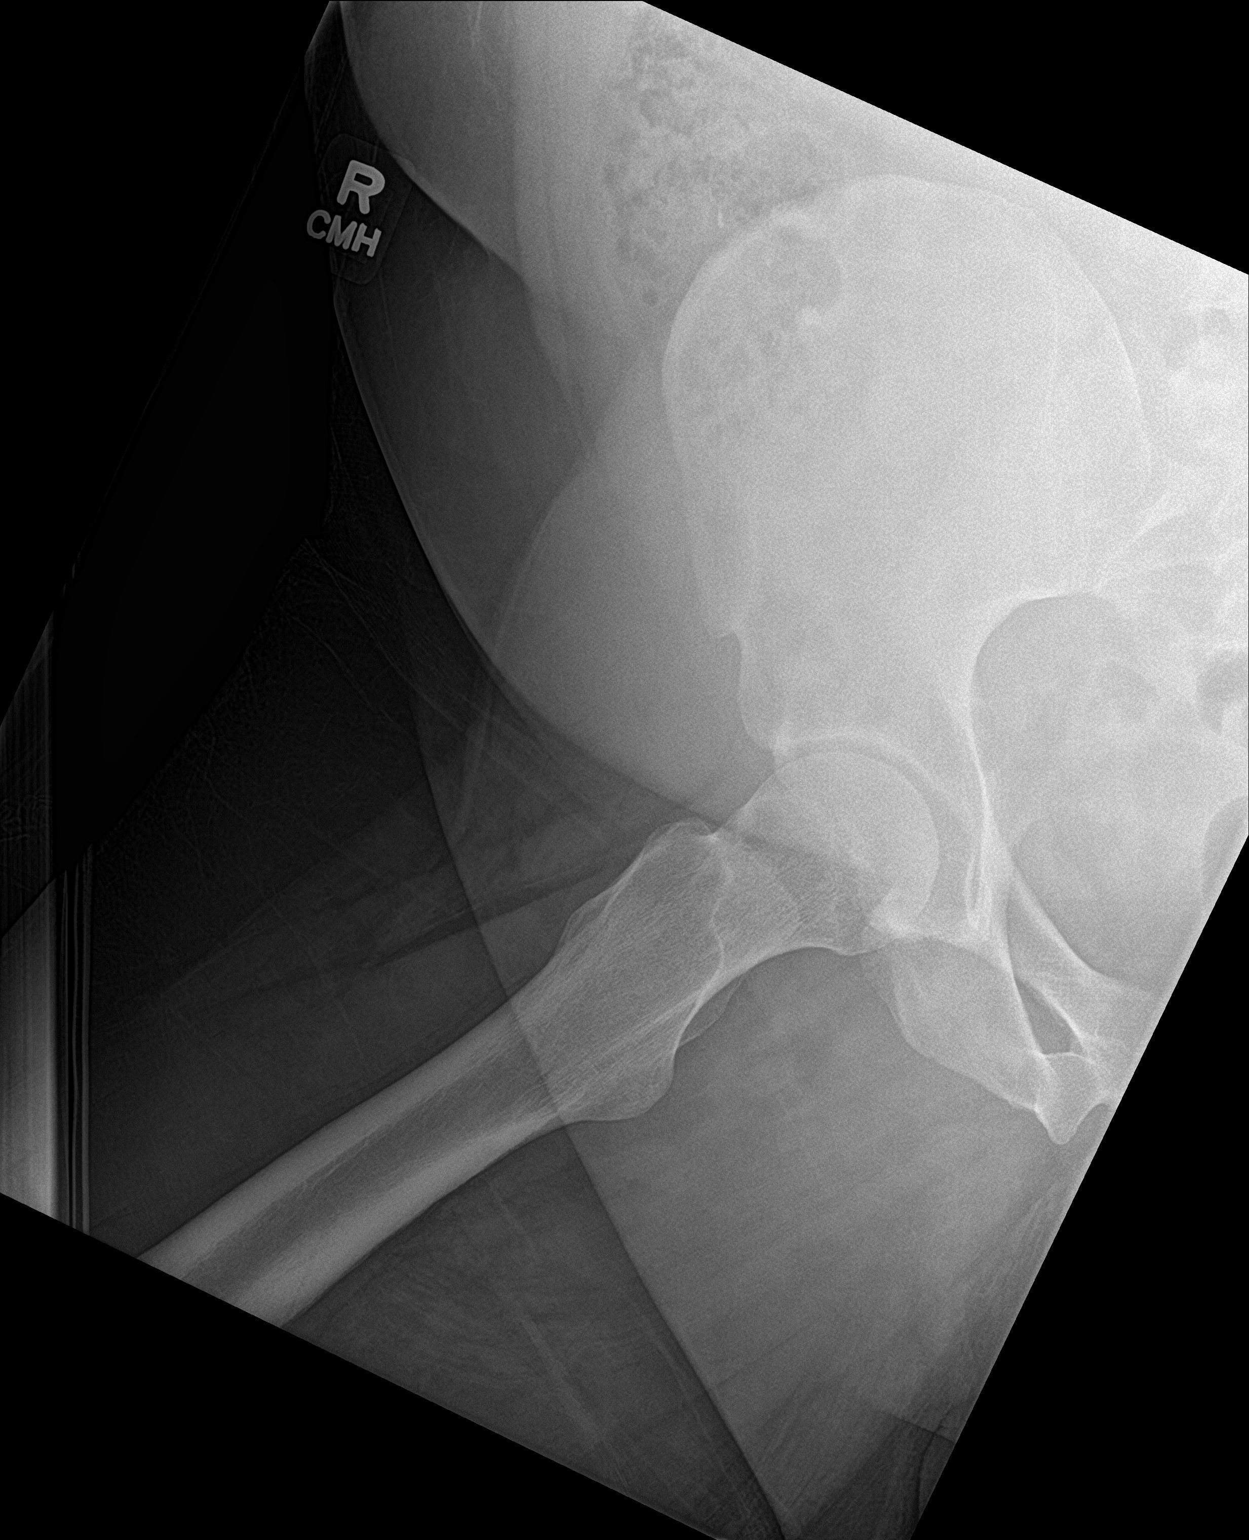

[3 of 3 positions shown; findings below may reference images not displayed]

FINDINGS: Pelvis appears stable and intact. Stable bone mineralization. Small
round circumscribed lucent area in the proximal left femur is
chronic and appears benign. Small round metallic clip or dystrophic
calcification projects over the right iliac wing. Proximal right
femur appears intact and normal. Negative visible bowel gas pattern,
pelvic visceral contours.
IMPRESSION: No acute osseous abnormality identified about the right hip or
pelvis.

## 2020-10-06 MED ORDER — HYDROMORPHONE HCL 1 MG/ML IJ SOLN
1.0000 mg | Freq: Once | INTRAMUSCULAR | Status: AC
  Administered 2020-10-06: 1 mg via INTRAMUSCULAR
  Filled 2020-10-06: qty 1

## 2020-10-06 MED ORDER — OXYCODONE-ACETAMINOPHEN 5-325 MG PO TABS: 2.0000 | ORAL_TABLET | Freq: Four times a day (QID) | ORAL | 0 refills | Status: DC | PRN

## 2020-10-06 MED ORDER — PREDNISONE 10 MG PO TABS: 20.0000 mg | ORAL_TABLET | Freq: Two times a day (BID) | ORAL | 0 refills | Status: DC

## 2020-10-06 NOTE — ED Triage Notes (Signed)
Pt c/o sciatic pain to right leg that started again tonight.

## 2020-10-06 NOTE — Discharge Instructions (Addendum)
Follow-up with Dr. Harrison or one of his partners next week 

## 2020-10-08 NOTE — ED Provider Notes (Signed)
[facility] EMERGENCY DEPARTMENT Provider Note   CSN: [id number] Arrival date & time: 10/06/20  3382     History Chief Complaint  Patient presents with   Sciatica    Herbert Walsh is a 30 y.o. male.  Patient complains of pain to his right hip and right thigh worse with ambulation  The history is provided by the patient and medical records. No language interpreter was used.  Hip Pain This is a new problem. The current episode started more than 2 days ago. The problem occurs constantly. The problem has not changed since onset.Pertinent negatives include no chest pain, no abdominal pain and no headaches. Nothing aggravates the symptoms. Nothing relieves the symptoms. He has tried nothing for the symptoms.      Past Medical History:  Diagnosis Date   Anemia    Arthritis    rheumatoid   Asthma    Depression    GERD (gastroesophageal reflux disease)    History of COVID-19 03/29/2020   Hypertension 2019   Ischemic colitis (HCC) 09/2008   MSSA (methicillin susceptible Staphylococcus aureus)    sternaoclavicular absecess drainage   Osteoarthritis    sterum and right shoulder   PTSD (post-traumatic stress disorder)    Substance use disorder    Tachycardia     Patient Active Problem List   Diagnosis Date Noted   Acute upper respiratory infection 10/06/2020   Adult-onset obesity 10/06/2020   Carbuncle and furuncle of buttock 10/06/2020   Contusion of knee 10/06/2020   Myopia 10/06/2020   Pharyngitis 10/06/2020   Screening examination for pulmonary tuberculosis 10/06/2020   Health examination of defined subpopulation 10/06/2020   Fitting and adjustment of spectacles and contact lenses 10/06/2020   COVID-19 01/22/2020   Nonhealing surgical wound 12/17/2019   Shoulder pain, right 11/19/2019   Encounter for surgical wound dressing change 09/29/2019   Encounter for postoperative wound check 09/24/2019   Change or removal of surgical wound dressing 09/03/2019   Draining  cutaneous sinus tract 08/27/2019   Open wound of chest wall with complication, right, sequela 08/20/2019   Wound check, abscess 08/06/2019   Neuropathic pain of chest 07/23/2019   Wound with infection determined by examination 07/17/2019   Open wound of chest wall 07/09/2019   Abscess of parasternal chest wall 07/02/2019   Osteomyelitis (HCC) 06/20/2019   MSSA (methicillin susceptible Staphylococcus aureus) infection 06/20/2019   Sternoclavicular (joint) (ligament) sprain 06/18/2019   Sternal osteomyelitis (HCC) 06/18/2019   Septic arthritis of knee, right (HCC) 05/31/2019   Myofascitis 05/31/2019   Prediabetes 05/31/2019   MSSA bacteremia 05/31/2019   Septic arthritis of right sternoclavicular joint (HCC) 05/30/2019   Rheumatoid arthritis (HCC) 05/30/2019   Glucosuria 05/30/2019   Tobacco use 05/05/2019   Use of energy drinks 05/05/2019   H. pylori infection 04/04/2019   Hyperplastic polyp of transverse colon 04/04/2019   Hyperplastic rectal polyp 04/04/2019   Intermittent explosive 03/19/2019   Difficulty controlling anger 03/19/2019   Colitis 03/11/2019   Gastroesophageal reflux disease 03/11/2019   NSAID long-term use 03/11/2019   Anemia 01/27/2019   Granulocytosis 01/27/2019   PTSD (post-traumatic stress disorder) 11/01/2017    Past Surgical History:  Procedure Laterality Date   APPENDECTOMY     APPLICATION OF WOUND VAC Right 06/20/2019   Procedure: APPLICATION OF WOUND VAC;  Surgeon: [name], MD;  Location: [facility] OR;  Service: Vascular;  Laterality: Right;   APPLICATION OF WOUND VAC Right 06/23/2019   Procedure: APPLICATION OF WOUND VAC;  Surgeon:  [name], MD;  Location: [facility] OR;  Service: Thoracic;  Laterality: Right;   COLONOSCOPY     x 2   I & D EXTREMITY Right 05/31/2019   Procedure: IRRIGATION AND DEBRIDEMENT LOWER EXTREMITY;  Surgeon: Tarry Kos, MD;  Location: [facility] OR;  Service: Orthopedics;  Laterality: Right;   I & D EXTREMITY Right 06/20/2019    Procedure: DEBRIDEMENT OF STERNOCLAVICULAR JOINT ABSCESS;  Surgeon: [name], MD;  Location: [facility] OR;  Service: Vascular;  Laterality: Right;   INCISION AND DRAINAGE OF WOUND Right 04/12/2020   Procedure: excision of right sternoclavicular joint infection;  Surgeon: Peggye Form, DO;  Location: [facility];  Service: Plastics;  Laterality: Right;  45 min total   STERNAL WOUND DEBRIDEMENT Right 09/19/2019   Procedure: EXCISIONAL DEBRIDEMENT AND IRRIGATION OF RIGHT STERNOCLAVICULAR JOINT WOUND;  Surgeon: [name], MD;  Location: [facility] OR;  Service: Thoracic;  Laterality: Right;   TEE WITHOUT CARDIOVERSION N/A 06/05/2019   Procedure: TRANSESOPHAGEAL ECHOCARDIOGRAM (TEE);  Surgeon: Lewayne Bunting, MD;  Location: [facility] ENDOSCOPY;  Service: Cardiovascular;  Laterality: N/A;   WOUND EXPLORATION Right 06/23/2019   Procedure: irrigation and clean out right shoulder;  Surgeon: [name], MD;  Location: [facility] OR;  Service: Thoracic;  Laterality: Right;       Family History  Problem Relation Age of Onset   Alcohol abuse Mother    Depression Mother    Drug abuse Mother    Physical abuse Mother    Sexual abuse Mother    ADD / ADHD Paternal Aunt    Alcohol abuse Maternal Grandfather    Bipolar disorder Maternal Grandmother    Schizophrenia Maternal Grandmother    Drug abuse Maternal Grandmother    Alcohol abuse Paternal Grandfather    Depression Paternal Grandfather    Depression Paternal Grandmother    Drug abuse Paternal Grandmother     Social History   Tobacco Use   Smoking status: Every Day    Packs/day: 0.50    Years: 15.00    Pack years: 7.50    Types: Cigarettes   Smokeless tobacco: Never  Vaping Use   Vaping Use: Never used  Substance Use Topics   Alcohol use: Not Currently    Comment: rare beer   Drug use: Not Currently    Types: Marijuana    Comment: cough med    Home Medications Prior to Admission medications   Medication Sig Start Date  End Date Taking? Authorizing Provider  oxyCODONE-acetaminophen (PERCOCET) 5-325 MG tablet Take 2 tablets by mouth every 6 (six) hours as needed. 10/06/20  Yes Bethann Berkshire, MD  predniSONE (DELTASONE) 10 MG tablet Take 2 tablets (20 mg total) by mouth 2 (two) times daily with a meal. 10/06/20  Yes Bethann Berkshire, MD  atorvastatin (LIPITOR) 10 MG tablet Take 10 mg by mouth at bedtime.  09/25/17   [provider]  Cholecalciferol (VITAMIN D3) 50 MCG (2000 UT) TABS Take 2,000 mg by mouth daily.    [provider]  cloNIDine (CATAPRES) 0.2 MG tablet Take 1 tablet (0.2 mg total) by mouth 3 (three) times daily. 06/28/20 12/25/20  Neysa Hotter, MD  doxycycline (VIBRAMYCIN) 100 MG capsule Take 100 mg by mouth 2 (two) times daily. 06/16/20   [provider]  folic acid (FOLVITE) 1 MG tablet Take 1 mg by mouth daily. 06/10/20   [provider]  lisinopril (PRINIVIL,ZESTRIL) 10 MG tablet Take 10 mg by mouth daily.  [provider]  lithium carbonate (ESKALITH) 450 MG CR tablet Take 1 tablet (450 mg total) by mouth at bedtime. Take with 300 mg to equal 750 mg at bedtime 09/27/20 12/26/20  Neysa Hotter, MD  metroNIDAZOLE (FLAGYL) 500 MG tablet Take 500 mg by mouth 3 (three) times daily.    [provider]  Multiple Vitamin (MULTIVITAMIN WITH MINERALS) TABS tablet Take 1 tablet by mouth daily.    [provider]  naltrexone (DEPADE) 50 MG tablet Take 0.5 tablets (25 mg total) by mouth at bedtime. 10/03/20 11/02/20  Neysa Hotter, MD  omeprazole (PRILOSEC) 40 MG capsule Take 1 capsule by mouth every morning. 05/25/20   [provider]  risperidone (RISPERDAL) 4 MG tablet Take 1 tablet (4 mg total) by mouth at bedtime. 06/28/20 12/25/20  Neysa Hotter, MD  sertraline (ZOLOFT) 100 MG tablet Take 2 tablets (200 mg total) by mouth daily. 09/27/20 03/26/21  Neysa Hotter, MD  traZODone (DESYREL) 100 MG tablet Take 2 tablets (200 mg total) by mouth at  bedtime. 06/28/20 12/25/20  Neysa Hotter, MD  zinc gluconate 50 MG tablet Take 50 mg by mouth daily.    [provider]    Allergies    Patient has no known allergies.  Review of Systems   Review of Systems  Constitutional:  Negative for appetite change and fatigue.  HENT:  Negative for congestion, ear discharge and sinus pressure.   Eyes:  Negative for discharge.  Respiratory:  Negative for cough.   Cardiovascular:  Negative for chest pain.  Gastrointestinal:  Negative for abdominal pain and diarrhea.  Genitourinary:  Negative for frequency and hematuria.  Musculoskeletal:  Negative for back pain.       Right hip pain  Skin:  Negative for rash.  Neurological:  Negative for seizures and headaches.  Psychiatric/Behavioral:  Negative for hallucinations.    Physical Exam Updated Vital Signs BP 122/81   Pulse 90   Temp 98 F (36.7 C)   Resp 16   Ht 6' (1.829 m)   Wt 124.7 kg   SpO2 97%   BMI 37.30 kg/m   Physical Exam Vitals and nursing note reviewed.  Constitutional:      Appearance: He is well-developed.  HENT:     Head: Normocephalic.     Nose: Nose normal.  Eyes:     General: No scleral icterus.    Conjunctiva/sclera: Conjunctivae normal.  Neck:     Thyroid: No thyromegaly.  Cardiovascular:     Rate and Rhythm: Normal rate and regular rhythm.     Heart sounds: No murmur heard.   No friction rub. No gallop.  Pulmonary:     Breath sounds: No stridor. No wheezing or rales.  Chest:     Chest wall: No tenderness.  Abdominal:     General: There is no distension.     Tenderness: There is no abdominal tenderness. There is no rebound.  Musculoskeletal:     Cervical back: Neck supple.     Comments: Tenderness to right hip and right lateral thigh  Lymphadenopathy:     Cervical: No cervical adenopathy.  Skin:    Findings: No erythema or rash.  Neurological:     Mental Status: He is alert and oriented to person, place, and time.     Motor: No abnormal  muscle tone.     Coordination: Coordination normal.  Psychiatric:        Behavior: Behavior normal.    ED Results / Procedures /  Treatments   Labs (all labs ordered are listed, but only abnormal results are displayed) Labs Reviewed - No data to display  EKG None  Radiology DG Lumbar Spine Complete  Result Date: 10/06/2020 CLINICAL DATA:  30 year old male with pain radiating to the right leg for 2 months. No known injury. EXAM: LUMBAR SPINE - COMPLETE 4+ VIEW COMPARISON:  CT Abdomen and Pelvis 01/29/2018. Normal lumbar segmentation. FINDINGS: Chronic straightening of lumbar lordosis is stable since 2019 along with subtle retrolisthesis of L5 on S1. No pars fracture. No acute osseous abnormality identified. Bone mineralization is within normal limits. Visible sacrum and SI joints appear intact. Preserved disc spaces. Negative visible abdominal and pelvic visceral contours. IMPRESSION: Stable since 2019 and essentially negative radiographic appearance of the lumbar spine. Electronically Signed   By: Odessa Fleming M.D.   On: 10/06/2020 08:27   DG Hip Unilat W or Wo Pelvis 2-3 Views Right  Result Date: 10/06/2020 CLINICAL DATA:  30 year old male with pain radiating to the right leg for 2 months. No known injury. EXAM: DG HIP (WITH OR WITHOUT PELVIS) 2-3V RIGHT COMPARISON:  CT Abdomen and Pelvis 01/29/2018. FINDINGS: Pelvis appears stable and intact. Stable bone mineralization. Small round circumscribed lucent area in the proximal left femur is chronic and appears benign. Small round metallic clip or dystrophic calcification projects over the right iliac wing. Proximal right femur appears intact and normal. Negative visible bowel gas pattern, pelvic visceral contours. IMPRESSION: No acute osseous abnormality identified about the right hip or pelvis. Electronically Signed   By: Odessa Fleming M.D.   On: 10/06/2020 08:25    Procedures Procedures   Medications Ordered in ED Medications  HYDROmorphone  (DILAUDID) injection 1 mg (1 mg Intramuscular Given 10/06/20 0819)  HYDROmorphone (DILAUDID) injection 1 mg (1 mg Intramuscular Given 10/06/20 1202)    ED Course  I have reviewed the triage vital signs and the nursing notes.  Pertinent labs & imaging results that were available during my care of the patient were reviewed by me and considered in my medical decision making (see chart for details).    MDM Rules/Calculators/A&P                           Musculoskeletal pain in right hip.  X-rays unremarkable.  Patient will follow up with Dr. Romeo Apple. Final Clinical Impression(s) / ED Diagnoses Final diagnoses:  Acute right hip pain    Rx / DC Orders ED Discharge Orders          Ordered    oxyCODONE-acetaminophen (PERCOCET) 5-325 MG tablet  Every 6 hours PRN        10/06/20 1214    predniSONE (DELTASONE) 10 MG tablet  2 times daily with meals        10/06/20 1214             Bethann Berkshire, MD 10/08/20 [phone]
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
## 2020-10-15 ENCOUNTER — Telehealth (INDEPENDENT_AMBULATORY_CARE_PROVIDER_SITE_OTHER): Payer: Medicaid Other | Admitting: Psychiatry

## 2020-10-15 ENCOUNTER — Other Ambulatory Visit: Payer: Self-pay

## 2020-10-15 ENCOUNTER — Encounter: Payer: Self-pay | Admitting: Psychiatry

## 2020-10-15 DIAGNOSIS — F4310 Post-traumatic stress disorder, unspecified: Secondary | ICD-10-CM | POA: Diagnosis not present

## 2020-10-15 DIAGNOSIS — F6381 Intermittent explosive disorder: Secondary | ICD-10-CM

## 2020-10-15 DIAGNOSIS — G4700 Insomnia, unspecified: Secondary | ICD-10-CM | POA: Diagnosis not present

## 2020-10-15 DIAGNOSIS — F330 Major depressive disorder, recurrent, mild: Secondary | ICD-10-CM | POA: Diagnosis not present

## 2020-10-15 NOTE — Patient Instructions (Signed)
1. Continue sertraline 200 mg daily  2. Continue risperidone 4 mg at night  3. Continue lithium 450 mg at night  4. Continue clonidine 0.2 mg three times a day  6. Continue Trazodone 200 mg daily 7. I would advise to restart naltrexone 25 mg at night  7. Next appointment:  10/27 at 11 AM  8. Recheck labs- lithium, BMP

## 2020-10-28 ENCOUNTER — Other Ambulatory Visit: Payer: Self-pay

## 2020-10-28 ENCOUNTER — Ambulatory Visit (INDEPENDENT_AMBULATORY_CARE_PROVIDER_SITE_OTHER): Payer: Medicaid Other | Admitting: Licensed Clinical Social Worker

## 2020-10-28 DIAGNOSIS — F4310 Post-traumatic stress disorder, unspecified: Secondary | ICD-10-CM

## 2020-10-28 NOTE — Progress Notes (Signed)
Virtual Visit via Video Note  I connected with Herbert Walsh on 10/28/20 at  3:00 PM EDT by a video enabled telemedicine application and verified that I am speaking with the correct person using two identifiers.  Location: Patient: home Provider: [facility]   I discussed the limitations of evaluation and management by telemedicine and the availability of in person appointments. The patient expressed understanding and agreed to proceed.  I discussed the assessment and treatment plan with the patient. The patient was provided an opportunity to ask questions and all were answered. The patient agreed with the plan and demonstrated an understanding of the instructions.   The patient was advised to call back or seek an in-person evaluation if the symptoms worsen or if the condition fails to improve as anticipated.  I provided 30 minutes of non-face-to-face time during this encounter.   Bedelia Pong R Leandrea Ackley, LCSW   THERAPIST PROGRESS NOTE  Session Time: 3-3:30  Participation Level: Active  Behavioral Response: DisheveledAlertDepressed  Type of Therapy: Individual Therapy  Treatment Goals addressed: Coping  Interventions: CBT and Supportive  Summary: Herbert Walsh is a 30 y.o. male who presents with symptoms consistent with depression diagnosis. Pt reports that overall mood is stable and that he is managing stress and anxiety well. Pt reports continuing sleep restlessness.   Allowed pt safe space to explore thoughts and feelings about recent external stressors and life events. Pt reports that he is continuing to have stress about his overall physical and dental health. Pt reports that he is spending a lot of time in the bed and playing on tablet. Pt reports he enjoys listening to music as well. Pt is engaging socially when playing poker on the weekends with friends.   Reviewed depression-management and CBT-based coping skills to help manage symptoms.   Suicidal/Homicidal: No  Therapist  Response: Herbert Walsh reports that  depression symptoms are better than they have been in the past. Pt reports that symptoms are continuing to come and go, which is reflective of fluctuating/intermittent progress. Treatment to continue as indicated.   Plan: Return again in 4 weeks.  Diagnosis: Axis I: PTSD    Axis II: No diagnosis    Ernest Haber Evangaline Jou, LCSW 10/28/2020

## 2020-11-01 NOTE — Plan of Care (Signed)
Developed  

## 2020-12-06 ENCOUNTER — Other Ambulatory Visit: Payer: Self-pay

## 2020-12-06 ENCOUNTER — Ambulatory Visit (INDEPENDENT_AMBULATORY_CARE_PROVIDER_SITE_OTHER): Payer: Medicaid Other | Admitting: Licensed Clinical Social Worker

## 2020-12-06 DIAGNOSIS — F4310 Post-traumatic stress disorder, unspecified: Secondary | ICD-10-CM | POA: Diagnosis not present

## 2020-12-06 NOTE — Progress Notes (Signed)
Virtual Visit via Video Note  I connected with Herbert Walsh on 12/06/20 at  2:00 PM EDT by a video enabled telemedicine application and verified that I am speaking with the correct person using two identifiers.  Location: Patient: home Provider: remote office [place], [place])   I discussed the limitations of evaluation and management by telemedicine and the availability of in person appointments. The patient expressed understanding and agreed to proceed.  I discussed the assessment and treatment plan with the patient. The patient was provided an opportunity to ask questions and all were answered. The patient agreed with the plan and demonstrated an understanding of the instructions.   The patient was advised to call back or seek an in-person evaluation if the symptoms worsen or if the condition fails to improve as anticipated.  I provided 30 minutes of non-face-to-face time during this encounter.   Herbert Bily R [name], LCSW  THERAPIST PROGRESS NOTE  Session Time: 2-230p  Participation Level: Active  Behavioral Response: DisheveledAlertDepressed  Type of Therapy: Individual Therapy  Treatment Goals addressed:  Goal: LTG: Reduce frequency, intensity, and duration of depression symptoms as evidenced by: pt self report Outcome: Not Progressing  Note: Pt reports continuing symptoms Goal: STG: @PREFFIRSTNAME @ will participate in at least 80% of scheduled individual psychotherapy sessions Outcome: Progressing  Interventions:  Intervention: Give positive reinforcement and praise  Intervention: Perform motivational interviewing regarding physical activity  Intervention: Assess emotional status and coping mechanisms  Intervention: REVIEW PLEASE SKILLS (TREAT PHYSICAL ILLNESS, BALANCE EATING, AVOID MOOD-ALTERING SUBSTANCES, BALANCE SLEEP AND GET EXERCISE) WITH Herbert Walsh   Summary: Herbert Walsh is a 30 y.o. male who presents with continuing depression symptoms. Pt reports that  overall mood has been stable and that he doesn't feel suicidal. Pt reports that he is not motivated to find work at this time because he is waiting to see if his disability is going to go through. Pt reports that he is not being physically active or making any active changes to help manage depression symptoms outside of taking medication.   Continued recommendations are as follows: self care behaviors, positive social engagements, focusing on overall work/home/life balance, and focusing on positive physical and emotional wellness.  .   Suicidal/Homicidal: No  Therapist Response: Pt is continuing to apply interventions learned in session into daily life situations. Pt is currently on track to meet goals utilizing interventions mentioned above. Personal growth and progress noted. Treatment to continue as indicated.   Plan: Return again in 4 weeks.  Diagnosis: Axis I: PTSD    Axis II: No diagnosis    34 Herbert Naill, LCSW 12/06/2020

## 2020-12-06 NOTE — Plan of Care (Signed)
  Problem: Depression CCP Problem  1 Decrease depressive symptoms and improve levels of effective functioning Goal: LTG: Reduce frequency, intensity, and duration of depression symptoms as evidenced by: pt self report Outcome: Not Progressing Note: Pt reports continuing symptoms Goal: STG: @PREFFIRSTNAME @ will participate in at least 80% of scheduled individual psychotherapy sessions Outcome: Progressing Intervention: Give positive reinforcement and praise Intervention: Perform motivational interviewing regarding physical activity Intervention: Assess emotional status and coping mechanisms Intervention: REVIEW PLEASE SKILLS (TREAT PHYSICAL ILLNESS, BALANCE EATING, AVOID MOOD-ALTERING SUBSTANCES, BALANCE SLEEP AND GET EXERCISE) WITH 
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
## 2020-12-09 ENCOUNTER — Encounter: Payer: Self-pay | Admitting: Psychiatry

## 2020-12-09 ENCOUNTER — Other Ambulatory Visit: Payer: Self-pay

## 2020-12-09 ENCOUNTER — Telehealth (INDEPENDENT_AMBULATORY_CARE_PROVIDER_SITE_OTHER): Payer: Medicaid Other | Admitting: Psychiatry

## 2020-12-09 DIAGNOSIS — F4310 Post-traumatic stress disorder, unspecified: Secondary | ICD-10-CM

## 2020-12-09 DIAGNOSIS — F6381 Intermittent explosive disorder: Secondary | ICD-10-CM | POA: Diagnosis not present

## 2020-12-09 DIAGNOSIS — G4700 Insomnia, unspecified: Secondary | ICD-10-CM | POA: Diagnosis not present

## 2020-12-09 DIAGNOSIS — F330 Major depressive disorder, recurrent, mild: Secondary | ICD-10-CM

## 2020-12-09 MED ORDER — LITHIUM CARBONATE ER 450 MG PO TBCR: 450.0000 mg | EXTENDED_RELEASE_TABLET | Freq: Every day | ORAL | 0 refills | Status: DC

## 2020-12-09 MED ORDER — TRAZODONE HCL 100 MG PO TABS: 200.0000 mg | ORAL_TABLET | Freq: Every day | ORAL | 1 refills | Status: DC

## 2020-12-09 MED ORDER — CLONIDINE HCL 0.2 MG PO TABS: 0.2000 mg | ORAL_TABLET | Freq: Three times a day (TID) | ORAL | 1 refills | Status: DC

## 2020-12-09 MED ORDER — RISPERIDONE 4 MG PO TABS: 4.0000 mg | ORAL_TABLET | Freq: Every day | ORAL | 1 refills | Status: DC

## 2020-12-09 NOTE — Patient Instructions (Signed)
Continue sertraline 200 mg daily Continue Risperdal 4 mg at night Continue lithium 450 mg at night Continue clonidine 0.2 mg 3 times a day Continue trazodone 200 mg at night as needed for insomnia Obtain lab (lithium, BMP) Next appointment: 1/5 at 11:30 for

## 2020-12-17 ENCOUNTER — Encounter: Payer: Self-pay | Admitting: Psychiatry

## 2020-12-17 LAB — BASIC METABOLIC PANEL
BUN/Creatinine Ratio: 10 (ref 9–20)
BUN: 8 mg/dL (ref 6–20)
CO2: 21 mmol/L (ref 20–29)
Calcium: 9.1 mg/dL (ref 8.7–10.2)
Chloride: 98 mmol/L (ref 96–106)
Creatinine, Ser: 0.84 mg/dL (ref 0.76–1.27)
Glucose: 352 mg/dL — ABNORMAL HIGH (ref 70–99)
Potassium: 4.6 mmol/L (ref 3.5–5.2)
Sodium: 135 mmol/L (ref 134–144)
eGFR: 119 mL/min/{1.73_m2} (ref >59)

## 2020-12-17 LAB — LITHIUM LEVEL: Lithium Lvl: 0.3 mmol/L — ABNORMAL LOW (ref 0.5–1.2)

## 2020-12-17 NOTE — Progress Notes (Signed)
Could you contact him and notify that his blood sugar is higher than normal range. I would advise that he follow up with his primary care. Lithium, kidney function is in acceptable range/no need to change the dose of lithium.

## 2020-12-21 ENCOUNTER — Telehealth: Payer: Self-pay | Admitting: Psychiatry

## 2020-12-21 NOTE — Telephone Encounter (Signed)
Attempted to contact patient to discuss his most recent labs-glucose level elevated at 352.  Lithium level-subtherapeutic at 0.3.  However phone kept ringing and patient was not reachable.  Unable to leave a voicemail.  Will have Shanda Bumps CMA try to contact this patient again to discuss.  We will route this message to his primary care provider as well as his primary psychiatrist-Dr. Vanetta Shawl to address.

## 2020-12-22 NOTE — Telephone Encounter (Signed)
could not reach anyone so i fax copy to the PCP and I sent a letter along with a copy of the labwork results and ask to reach out to PCP that labwork was already faxed to provider.

## 2021-01-13 ENCOUNTER — Other Ambulatory Visit: Payer: Self-pay

## 2021-01-13 ENCOUNTER — Ambulatory Visit (INDEPENDENT_AMBULATORY_CARE_PROVIDER_SITE_OTHER): Payer: Medicaid Other | Admitting: Licensed Clinical Social Worker

## 2021-01-13 DIAGNOSIS — F4310 Post-traumatic stress disorder, unspecified: Secondary | ICD-10-CM

## 2021-01-13 NOTE — Progress Notes (Signed)
Virtual Visit via Video Note  I connected with Herbert Walsh on 01/13/21 at  2:00 PM EST by a video enabled telemedicine application and verified that I am speaking with the correct person using two identifiers.  Location: Patient: home Provider: remote office [place], [place])   I discussed the limitations of evaluation and management by telemedicine and the availability of in person appointments. The patient expressed understanding and agreed to proceed.  I discussed the assessment and treatment plan with the patient. The patient was provided an opportunity to ask questions and all were answered. The patient agreed with the plan and demonstrated an understanding of the instructions.   The patient was advised to call back or seek an in-person evaluation if the symptoms worsen or if the condition fails to improve as anticipated.  I provided 18 minutes of non-face-to-face time during this encounter.   Herbert Steagall R Vanesha Athens, LCSW  THERAPIST PROGRESS NOTE  Session Time: 2-218p  Participation Level: Active  Behavioral Response: DisheveledAlertDepressed  Type of Therapy: Individual Therapy  Treatment Goals addressed:  Goal: LTG: Reduce frequency, intensity, and duration of depression symptoms as evidenced by: pt self report Outcome: Not Progressing  Note: Pt reports continuing symptoms Goal: STG: @PREFFIRSTNAME @ will participate in at least 80% of scheduled individual psychotherapy sessions Outcome: Progressing  Interventions:  Intervention: Give positive reinforcement and praise  Intervention: Perform motivational interviewing regarding physical activity  Intervention: Assess emotional status and coping mechanisms  Intervention: REVIEW PLEASE SKILLS (TREAT PHYSICAL ILLNESS, BALANCE EATING, AVOID MOOD-ALTERING SUBSTANCES, BALANCE SLEEP AND GET EXERCISE) WITH Herbert Walsh   Summary: Herbert Walsh is a 30 y.o. male who presents with continuing depression symptoms. Pt reports that  overall mood has been stable and that he doesn't feel suicidal. Pt reports inconsistent quality and quantity of sleep--pt reporting that he feels he needs an increase in trazodone but doesn't feel his psychiatrist would be  Pt reports that he is not being physically active or making any active changes to help manage depression symptoms outside of taking medication. Discussed lack of motivation, negative thoughts about self/others, and other depression symptoms. Pt reports that other than taking medication, he is not engaging in a lot of stimulating activities. Reviewed CBT-based behaviors/strategies that could assist in symptom management.   Pt seemed rushed--ended session early.  Continued recommendations are as follows: self care behaviors, positive social engagements, focusing on overall work/home/life balance, and focusing on positive physical and emotional wellness.  .   Suicidal/Homicidal: No  Therapist Response: Pt is continuing to apply interventions learned in session into daily life situations. Pt is currently on track to meet goals utilizing interventions mentioned above. Personal growth and progress noted. Treatment to continue as indicated.   Plan: Return again in 4 weeks.  Diagnosis: Axis I: PTSD    Axis II: No diagnosis    34 Herbert Harbaugh, LCSW 01/13/2021

## 2021-01-28 ENCOUNTER — Other Ambulatory Visit: Payer: Self-pay
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
## 2021-02-17 ENCOUNTER — Encounter: Payer: Self-pay | Admitting: Psychiatry

## 2021-02-17 ENCOUNTER — Telehealth (INDEPENDENT_AMBULATORY_CARE_PROVIDER_SITE_OTHER): Payer: Medicaid Other | Admitting: Psychiatry

## 2021-02-17 ENCOUNTER — Telehealth: Payer: Medicaid Other | Admitting: Psychiatry

## 2021-02-17 ENCOUNTER — Other Ambulatory Visit: Payer: Self-pay

## 2021-02-17 DIAGNOSIS — F6381 Intermittent explosive disorder: Secondary | ICD-10-CM

## 2021-02-17 DIAGNOSIS — G4700 Insomnia, unspecified: Secondary | ICD-10-CM

## 2021-02-17 DIAGNOSIS — F4310 Post-traumatic stress disorder, unspecified: Secondary | ICD-10-CM

## 2021-02-17 DIAGNOSIS — F330 Major depressive disorder, recurrent, mild: Secondary | ICD-10-CM

## 2021-02-17 MED ORDER — LITHIUM CARBONATE ER 450 MG PO TBCR: 450.0000 mg | EXTENDED_RELEASE_TABLET | Freq: Every day | ORAL | 0 refills | Status: DC

## 2021-02-17 MED ORDER — SERTRALINE HCL 100 MG PO TABS: 200.0000 mg | ORAL_TABLET | Freq: Every day | ORAL | 1 refills | Status: DC

## 2021-02-17 NOTE — Patient Instructions (Addendum)
Continue sertraline 200 mg daily Continue Risperdal 4 mg at night Continue lithium 450 mg at night Continue clonidine 0.2 mg 3 times a day Continue trazodone 200 mg at night as needed for insomnia Next appointment: 3/7 at 10:30, video

## 2021-02-18 ENCOUNTER — Encounter: Payer: Self-pay | Admitting: Psychiatry
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
## 2021-02-28 ENCOUNTER — Telehealth: Payer: Self-pay | Admitting: Licensed Clinical Social Worker

## 2021-02-28 ENCOUNTER — Ambulatory Visit (INDEPENDENT_AMBULATORY_CARE_PROVIDER_SITE_OTHER): Payer: Self-pay | Admitting: Licensed Clinical Social Worker

## 2021-02-28 ENCOUNTER — Other Ambulatory Visit: Payer: Self-pay

## 2021-02-28 DIAGNOSIS — Z91199 Patient's noncompliance with other medical treatment and regimen due to unspecified reason: Secondary | ICD-10-CM

## 2021-02-28 NOTE — Telephone Encounter (Signed)
LCSW counselor tried to connect with patient for scheduled appointment via MyChart video text request x 2 and email request; also tried to connect via phone without success. LCSW counselor left message for patient to call office number to reschedule OPT appointment.  Attempt 1 text and email:: 2:06p Attempt 2 text and email:  2:12p Phone attempt: 2:16p

## 2021-02-28 NOTE — Progress Notes (Signed)
LCSW counselor tried to connect with patient for scheduled appointment via MyChart video text request x 2 and email request; also tried to connect via phone without success. LCSW counselor left message for patient to call office number to reschedule OPT appointment. ° °Attempt 1 text and email:: 2:06p °Attempt 2 text and email:  2:12p °Phone attempt: 2:16p °

## 2021-04-13 ENCOUNTER — Other Ambulatory Visit: Payer: Self-pay

## 2021-04-13 ENCOUNTER — Encounter: Payer: Self-pay | Admitting: Licensed Clinical Social Worker

## 2021-04-13 ENCOUNTER — Telehealth: Payer: Self-pay | Admitting: Licensed Clinical Social Worker

## 2021-04-13 ENCOUNTER — Ambulatory Visit (INDEPENDENT_AMBULATORY_CARE_PROVIDER_SITE_OTHER): Payer: Self-pay | Admitting: Licensed Clinical Social Worker

## 2021-04-13 DIAGNOSIS — Z91199 Patient's noncompliance with other medical treatment and regimen due to unspecified reason: Secondary | ICD-10-CM

## 2021-04-13 NOTE — Progress Notes (Signed)
LCSW counselor tried to connect with patient for scheduled appointment via MyChart video text request x 2 and email request; also tried to connect via phone without success. LCSW counselor left message for patient to call office number to reschedule OPT appointment.  ? ?Attempt 1: Text and email: 1:06p ? ?Attempt 2: Text and email: 1:11p ? ?Attempt 3: phone call: 1:16pm left message ? ?Visit will be coded as a no show ? ?This is No show#3 for pt. Will recommend dismissal letter.  ?

## 2021-04-13 NOTE — Telephone Encounter (Signed)
LCSW counselor tried to connect with patient for scheduled appointment via MyChart video text request x 2 and email request; also tried to connect via phone without success. LCSW counselor left message for patient to call office number to reschedule OPT appointment.  ? ?Attempt 1: Text and email: 1:06p ? ?Attempt 2: Text and email: 1:11p ? ?Attempt 3: phone call: 1:16pm left message ? ?Visit will be coded as a no show ?
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
## 2021-04-19 ENCOUNTER — Telehealth: Payer: Medicaid Other | Admitting: Psychiatry

## 2021-04-19 ENCOUNTER — Encounter: Payer: Self-pay | Admitting: Psychiatry

## 2021-04-19 ENCOUNTER — Telehealth (INDEPENDENT_AMBULATORY_CARE_PROVIDER_SITE_OTHER): Payer: Medicaid Other | Admitting: Psychiatry

## 2021-04-19 ENCOUNTER — Other Ambulatory Visit: Payer: Self-pay

## 2021-04-19 DIAGNOSIS — F330 Major depressive disorder, recurrent, mild: Secondary | ICD-10-CM

## 2021-04-19 DIAGNOSIS — G4700 Insomnia, unspecified: Secondary | ICD-10-CM

## 2021-04-19 DIAGNOSIS — F4310 Post-traumatic stress disorder, unspecified: Secondary | ICD-10-CM

## 2021-04-19 DIAGNOSIS — F6381 Intermittent explosive disorder: Secondary | ICD-10-CM | POA: Diagnosis not present

## 2021-04-19 MED ORDER — LITHIUM CARBONATE ER 450 MG PO TBCR: 450.0000 mg | EXTENDED_RELEASE_TABLET | Freq: Every day | ORAL | 0 refills | Status: DC

## 2021-04-19 MED ORDER — TRAZODONE HCL 100 MG PO TABS: 200.0000 mg | ORAL_TABLET | Freq: Every day | ORAL | 0 refills | Status: DC

## 2021-04-19 MED ORDER — CLONIDINE HCL 0.2 MG PO TABS: 0.2000 mg | ORAL_TABLET | Freq: Three times a day (TID) | ORAL | 0 refills | Status: DC

## 2021-04-19 MED ORDER — RISPERIDONE 4 MG PO TABS: 4.0000 mg | ORAL_TABLET | Freq: Every day | ORAL | 0 refills | Status: DC

## 2021-04-19 NOTE — Patient Instructions (Signed)
Continue sertraline 200 mg daily ?Continue Risperdal 4 mg at night ?Continue lithium 450 mg at night ?Continue clonidine 0.2 mg 3 times a day ?Continue trazodone 200 mg at night as needed for insomnia ?Next appointment: 5/23 at 10 AM, video ?
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
## 2021-07-05 ENCOUNTER — Encounter: Payer: Self-pay | Admitting: Psychiatry

## 2021-07-05 ENCOUNTER — Telehealth (INDEPENDENT_AMBULATORY_CARE_PROVIDER_SITE_OTHER): Payer: Medicaid Other | Admitting: Psychiatry

## 2021-07-05 DIAGNOSIS — G4700 Insomnia, unspecified: Secondary | ICD-10-CM

## 2021-07-05 DIAGNOSIS — F6381 Intermittent explosive disorder: Secondary | ICD-10-CM

## 2021-07-05 DIAGNOSIS — F4310 Post-traumatic stress disorder, unspecified: Secondary | ICD-10-CM | POA: Diagnosis not present

## 2021-07-05 DIAGNOSIS — F330 Major depressive disorder, recurrent, mild: Secondary | ICD-10-CM | POA: Diagnosis not present

## 2021-07-05 NOTE — Patient Instructions (Signed)
Continue sertraline 200 mg daily Continue Risperdal 4 mg at night Continue lithium 450 mg at night Continue clonidine 0.2 mg 3 times a day Continue trazodone 200 mg at night as needed for insomnia Obtain labs (lithium, TSH, BMP) Next appointment: 7/19 at 11 AM

## 2021-08-04 ENCOUNTER — Telehealth: Payer: Self-pay
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
## 2021-08-10 ENCOUNTER — Encounter: Payer: Self-pay | Admitting: Psychiatry

## 2021-08-10 LAB — BASIC METABOLIC PANEL
BUN/Creatinine Ratio: 12 (ref 9–20)
BUN: 8 mg/dL (ref 6–20)
CO2: 19 mmol/L — ABNORMAL LOW (ref 20–29)
Calcium: 9.2 mg/dL (ref 8.7–10.2)
Chloride: 102 mmol/L (ref 96–106)
Creatinine, Ser: 0.67 mg/dL — ABNORMAL LOW (ref 0.76–1.27)
Glucose: 190 mg/dL — ABNORMAL HIGH (ref 70–99)
Potassium: 4.3 mmol/L (ref 3.5–5.2)
Sodium: 137 mmol/L (ref 134–144)
eGFR: 126 mL/min/{1.73_m2} (ref >59)

## 2021-08-10 LAB — LITHIUM LEVEL: Lithium Lvl: 0.3 mmol/L — ABNORMAL LOW (ref 0.5–1.2)

## 2021-08-10 LAB — TSH: TSH: 2.9 u[IU]/mL (ref 0.450–4.500)
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
## 2021-08-31 ENCOUNTER — Encounter: Payer: Self-pay | Admitting: Psychiatry

## 2021-08-31 ENCOUNTER — Telehealth (INDEPENDENT_AMBULATORY_CARE_PROVIDER_SITE_OTHER): Payer: Medicaid Other | Admitting: Psychiatry

## 2021-08-31 DIAGNOSIS — F6381 Intermittent explosive disorder: Secondary | ICD-10-CM

## 2021-08-31 DIAGNOSIS — F4310 Post-traumatic stress disorder, unspecified: Secondary | ICD-10-CM | POA: Diagnosis not present

## 2021-08-31 DIAGNOSIS — F331 Major depressive disorder, recurrent, moderate: Secondary | ICD-10-CM | POA: Diagnosis not present

## 2021-08-31 DIAGNOSIS — G4700 Insomnia, unspecified: Secondary | ICD-10-CM

## 2021-08-31 MED ORDER — RISPERIDONE 4 MG PO TABS: 4.0000 mg | ORAL_TABLET | Freq: Every day | ORAL | 0 refills | Status: DC

## 2021-08-31 MED ORDER — LITHIUM CARBONATE 600 MG PO CAPS: 600.0000 mg | ORAL_CAPSULE | Freq: Every day | ORAL | 0 refills | Status: DC

## 2021-08-31 MED ORDER — SERTRALINE HCL 100 MG PO TABS: 200.0000 mg | ORAL_TABLET | Freq: Every day | ORAL | 1 refills | Status: DC

## 2021-08-31 MED ORDER — TRAZODONE HCL 100 MG PO TABS: 200.0000 mg | ORAL_TABLET | Freq: Every day | ORAL | 1 refills | Status: DC

## 2021-08-31 MED ORDER — CLONIDINE HCL 0.2 MG PO TABS: 0.2000 mg | ORAL_TABLET | Freq: Three times a day (TID) | ORAL | 0 refills | Status: DC

## 2021-08-31 NOTE — Patient Instructions (Signed)
Continue sertraline 200 mg daily Continue Risperdal 4 mg at night Increase lithium 600 mg at night Obtain labs (lithium, BMP) a week after taking higher dose of lithium Continue clonidine 0.2 mg 3 times a day Continue trazodone 200 mg at night as needed for insomnia Next appointment: 8/16 at 8 AM

## 2021-09-10 LAB — BASIC METABOLIC PANEL
BUN/Creatinine Ratio: 9 (ref 9–20)
BUN: 7 mg/dL (ref 6–20)
CO2: 17 mmol/L — ABNORMAL LOW (ref 20–29)
Calcium: 9.2 mg/dL (ref 8.7–10.2)
Chloride: 98 mmol/L (ref 96–106)
Creatinine, Ser: 0.81 mg/dL (ref 0.76–1.27)
Glucose: 282 mg/dL — ABNORMAL HIGH (ref 70–99)
Potassium: 4.4 mmol/L (ref 3.5–5.2)
Sodium: 134 mmol/L (ref 134–144)
eGFR: 119 mL/min/{1.73_m2} (ref >59)

## 2021-09-10 LAB — LITHIUM LEVEL: Lithium Lvl: 0.5 mmol/L (ref 0.5–1.2)

## 2021-09-11 ENCOUNTER — Encounter: Payer: Self-pay | Admitting: Psychiatry

## 2021-09-12 ENCOUNTER — Emergency Department (HOSPITAL_COMMUNITY)
Admission: EM | Admit: 2021-09-12 | Discharge: 2021-09-13 | Disposition: A | Discharge status: Home / Self Care | Payer: Medicaid Other | Attending: Emergency Medicine | Admitting: Emergency Medicine

## 2021-09-12 ENCOUNTER — Encounter (HOSPITAL_COMMUNITY): Payer: Self-pay | Admitting: Emergency Medicine

## 2021-09-12 ENCOUNTER — Other Ambulatory Visit: Payer: Self-pay

## 2021-09-12 DIAGNOSIS — H9201 Otalgia, right ear: Secondary | ICD-10-CM | POA: Diagnosis not present

## 2021-09-12 DIAGNOSIS — R200 Anesthesia of skin: Secondary | ICD-10-CM | POA: Diagnosis not present

## 2021-09-12 DIAGNOSIS — R519 Headache, unspecified: Secondary | ICD-10-CM | POA: Diagnosis not present

## 2021-09-12 NOTE — ED Triage Notes (Signed)
Pt c/o right ear and temple pain/numbness x 2 months. Pt states he is worried it is a csf leak

## 2021-09-13 NOTE — ED Provider Notes (Signed)
[facility] EMERGENCY DEPARTMENT Provider Note   CSN: [id number] Arrival date & time: 09/12/21  2319     History  Chief Complaint  Patient presents with   Ear Pain    Herbert Walsh is a 31 y.o. male.  Presents with right-sided headaches and pain around the right ear with numbness of the cartilage of the ear that has been ongoing for 2 months.  He has been seen by neurology, had an outpatient MRI that was negative.  Patient now concerned that he may have a CSF leak because there is sometimes swelling in front of the ear.       Home Medications Prior to Admission medications   Medication Sig Start Date End Date Taking? Authorizing Provider  atorvastatin (LIPITOR) 10 MG tablet Take 10 mg by mouth at bedtime.  09/25/17   [provider]  Cholecalciferol (VITAMIN D3) 50 MCG (2000 UT) TABS Take 2,000 mg by mouth daily.    [provider]  cloNIDine (CATAPRES) 0.2 MG tablet Take 1 tablet (0.2 mg total) by mouth 3 (three) times daily. 09/23/21 12/22/21  Neysa Hotter, MD  doxycycline (VIBRAMYCIN) 100 MG capsule Take 100 mg by mouth 2 (two) times daily. 06/16/20   [provider]  folic acid (FOLVITE) 1 MG tablet Take 1 mg by mouth daily. 06/10/20   [provider]  lisinopril (PRINIVIL,ZESTRIL) 10 MG tablet Take 10 mg by mouth daily.    [provider]  lithium 600 MG capsule Take 1 capsule (600 mg total) by mouth at bedtime. 08/31/21 09/30/21  Neysa Hotter, MD  metFORMIN (GLUCOPHAGE) 500 MG tablet Take 500 mg by mouth daily.    [provider]  metroNIDAZOLE (FLAGYL) 500 MG tablet Take 500 mg by mouth 3 (three) times daily.    [provider]  Multiple Vitamin (MULTIVITAMIN WITH MINERALS) TABS tablet Take 1 tablet by mouth daily.    [provider]  omeprazole (PRILOSEC) 40 MG capsule Take 1 capsule by mouth every morning. 05/25/20   [provider]  oxyCODONE-acetaminophen (PERCOCET) 5-325 MG tablet Take 2  tablets by mouth every 6 (six) hours as needed. 10/06/20   Bethann Berkshire, MD  predniSONE (DELTASONE) 10 MG tablet Take 2 tablets (20 mg total) by mouth 2 (two) times daily with a meal. 10/06/20   Bethann Berkshire, MD  risperidone (RISPERDAL) 4 MG tablet Take 1 tablet (4 mg total) by mouth at bedtime. 09/24/21 12/23/21  Neysa Hotter, MD  sertraline (ZOLOFT) 100 MG tablet Take 2 tablets (200 mg total) by mouth daily. 09/24/21 03/23/22  Neysa Hotter, MD  traZODone (DESYREL) 100 MG tablet Take 2 tablets (200 mg total) by mouth at bedtime. 09/24/21 03/23/22  Neysa Hotter, MD  zinc gluconate 50 MG tablet Take 50 mg by mouth daily.    [provider]      Allergies    Patient has no known allergies.    Review of Systems   Review of Systems  Physical Exam Updated Vital Signs BP (!) 119/92   Pulse (!) 112   Temp 98 F (36.7 C)   Ht 5' 11.5" (1.816 m)   Wt 124.7 kg   SpO2 96%   BMI 37.82 kg/m  Physical Exam Vitals and nursing note reviewed.  Constitutional:      General: He is not in acute distress.    Appearance: He is well-developed.  HENT:     Head: Normocephalic and atraumatic.     Right Ear: Tympanic membrane and ear  canal normal.     Mouth/Throat:     Mouth: Mucous membranes are moist.  Eyes:     General: Vision grossly intact. Gaze aligned appropriately.     Extraocular Movements: Extraocular movements intact.     Conjunctiva/sclera: Conjunctivae normal.  Cardiovascular:     Rate and Rhythm: Normal rate and regular rhythm.     Pulses: Normal pulses.     Heart sounds: Normal heart sounds, S1 normal and S2 normal. No murmur heard.    No friction rub. No gallop.  Pulmonary:     Effort: Pulmonary effort is normal. No respiratory distress.     Breath sounds: Normal breath sounds.  Abdominal:     Palpations: Abdomen is soft.     Tenderness: There is no abdominal tenderness. There is no guarding or rebound.     Hernia: No hernia is present.  Musculoskeletal:         General: No swelling.     Cervical back: Full passive range of motion without pain, normal range of motion and neck supple. No pain with movement, spinous process tenderness or muscular tenderness. Normal range of motion.     Right lower leg: No edema.     Left lower leg: No edema.  Skin:    General: Skin is warm and dry.     Capillary Refill: Capillary refill takes less than 2 seconds.     Findings: No ecchymosis, erythema, lesion or wound.  Neurological:     Mental Status: He is alert and oriented to person, place, and time.     GCS: GCS eye subscore is 4. GCS verbal subscore is 5. GCS motor subscore is 6.     Cranial Nerves: Cranial nerves 2-12 are intact.     Sensory: Sensation is intact.     Motor: Motor function is intact. No weakness or abnormal muscle tone.     Coordination: Coordination is intact.  Psychiatric:        Mood and Affect: Mood normal.        Speech: Speech normal.        Behavior: Behavior normal.     ED Results / Procedures / Treatments   Labs (all labs ordered are listed, but only abnormal results are displayed) Labs Reviewed - No data to display  EKG None  Radiology No results found.  Procedures Procedures    Medications Ordered in ED Medications - No data to display  ED Course/ Medical Decision Making/ A&P                           Medical Decision Making  Patient with frequent headaches that are currently being evaluated by neurology.  Patient had outpatient MRI that did not show any acute findings.  Patient concerned about CSF leak.  He has not had any recent trauma.  Tympanic membrane is normal, intact.  He thinks that the swelling around the scalp and ear could be CSF which is anatomically does not make sense.  Reassured the patient, he does not need any further work-up today.  Continue to follow-up with neurology.        Final Clinical Impression(s) / ED Diagnoses Final diagnoses:  Bad headache    Rx / DC Orders ED Discharge  Orders     None         Izzak Fries, Canary Brim, MD 09/13/21 [phone]
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
## 2021-09-28 ENCOUNTER — Telehealth: Payer: Self-pay | Admitting: Psychiatry

## 2021-09-28 ENCOUNTER — Telehealth (INDEPENDENT_AMBULATORY_CARE_PROVIDER_SITE_OTHER): Payer: Medicaid Other | Admitting: Psychiatry

## 2021-09-28 ENCOUNTER — Encounter: Payer: Self-pay | Admitting: Psychiatry

## 2021-09-28 DIAGNOSIS — F6381 Intermittent explosive disorder: Secondary | ICD-10-CM | POA: Diagnosis not present

## 2021-09-28 DIAGNOSIS — F331 Major depressive disorder, recurrent, moderate: Secondary | ICD-10-CM | POA: Diagnosis not present

## 2021-09-28 DIAGNOSIS — F4310 Post-traumatic stress disorder, unspecified: Secondary | ICD-10-CM

## 2021-09-28 MED ORDER — LITHIUM CARBONATE 600 MG PO CAPS: 600.0000 mg | ORAL_CAPSULE | Freq: Every day | ORAL | 0 refills | Status: DC

## 2021-09-28 NOTE — Telephone Encounter (Signed)
Contacted Crystal,  case manger at [employer] [phone]. Left message to contact back.   When she calls [place], could you ask about CST referral we made- whether he will be getting into that program. Thanks.

## 2021-09-29 ENCOUNTER — Telehealth: Payer: Self-pay

## 2021-09-29 NOTE — Telephone Encounter (Signed)
[facility] called please call [phone] returning your call

## 2021-09-29 NOTE — Telephone Encounter (Signed)
Left voice message to call back. We did receive that he is unable to get in to CST program. Could you ask the reason of this when she calls back? Thanks.
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
## 2021-11-23 ENCOUNTER — Telehealth (INDEPENDENT_AMBULATORY_CARE_PROVIDER_SITE_OTHER): Payer: Medicaid Other | Admitting: Psychiatry

## 2021-11-23 ENCOUNTER — Encounter: Payer: Self-pay | Admitting: Psychiatry

## 2021-11-23 DIAGNOSIS — G4700 Insomnia, unspecified: Secondary | ICD-10-CM

## 2021-11-23 DIAGNOSIS — F331 Major depressive disorder, recurrent, moderate: Secondary | ICD-10-CM

## 2021-11-23 DIAGNOSIS — F4310 Post-traumatic stress disorder, unspecified: Secondary | ICD-10-CM | POA: Diagnosis not present

## 2021-11-23 DIAGNOSIS — F6381 Intermittent explosive disorder: Secondary | ICD-10-CM | POA: Diagnosis not present

## 2021-11-23 DIAGNOSIS — F17210 Nicotine dependence, cigarettes, uncomplicated: Secondary | ICD-10-CM

## 2021-11-23 MED ORDER — RISPERIDONE 4 MG PO TABS: 4.0000 mg | ORAL_TABLET | Freq: Every day | ORAL | 0 refills | Status: DC

## 2021-11-23 MED ORDER — CLONIDINE HCL 0.2 MG PO TABS: 0.2000 mg | ORAL_TABLET | Freq: Three times a day (TID) | ORAL | 1 refills | Status: DC

## 2021-11-23 NOTE — Patient Instructions (Signed)
Continue sertraline 200 mg daily Continue Risperdal 4 mg at night Continue lithium 600 mg at night Continue clonidine 0.2 mg 3 times a day Continue trazodone 200 mg at night as needed for insomnia Referral to [facility] for therapy  Next appointment: 12/12 at 10:30

## 2021-11-30 LAB — LITHIUM LEVEL: Lithium Lvl: 0.5 mmol/L (ref 0.5–1.2)

## 2021-12-01 ENCOUNTER — Encounter: Payer: Self-pay | Admitting: Psychiatry

## 2021-12-01 NOTE — Progress Notes (Signed)
Could you contact the lab if they can add BMP? If not, please contact him to get this lab as well. Please also notify him that lithium level is within acceptable range.

## 2021-12-09 NOTE — Progress Notes (Signed)
Pt was told that additional testing will need to be done

## 2021-12-28 ENCOUNTER — Other Ambulatory Visit: Payer: Self-pay | Admitting: Psychiatry
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
## 2022-01-24 ENCOUNTER — Other Ambulatory Visit
Admission: RE | Admit: 2022-01-24 | Discharge: 2022-01-24 | Disposition: A | Discharge status: Home / Self Care | Payer: Medicaid Other | Source: Ambulatory Visit | Attending: Psychiatry | Admitting: Psychiatry

## 2022-01-24 ENCOUNTER — Telehealth: Payer: Self-pay | Admitting: Psychiatry

## 2022-01-24 ENCOUNTER — Encounter: Payer: Self-pay | Admitting: Psychiatry

## 2022-01-24 ENCOUNTER — Ambulatory Visit (INDEPENDENT_AMBULATORY_CARE_PROVIDER_SITE_OTHER): Payer: Medicaid Other | Admitting: Psychiatry

## 2022-01-24 VITALS — BP 125/86 | HR 112 | Temp 97.8°F | Ht 71.5 in | Wt 268.2 lb

## 2022-01-24 DIAGNOSIS — F6381 Intermittent explosive disorder: Secondary | ICD-10-CM | POA: Diagnosis not present

## 2022-01-24 DIAGNOSIS — R200 Anesthesia of skin: Secondary | ICD-10-CM

## 2022-01-24 DIAGNOSIS — F4310 Post-traumatic stress disorder, unspecified: Secondary | ICD-10-CM | POA: Diagnosis not present

## 2022-01-24 DIAGNOSIS — F331 Major depressive disorder, recurrent, moderate: Secondary | ICD-10-CM

## 2022-01-24 DIAGNOSIS — G4700 Insomnia, unspecified: Secondary | ICD-10-CM

## 2022-01-24 LAB — BASIC METABOLIC PANEL
Anion gap: 8 (ref 5–15)
BUN: 8 mg/dL (ref 6–20)
CO2: 21 mmol/L — ABNORMAL LOW (ref 22–32)
Calcium: 9.3 mg/dL (ref 8.9–10.3)
Chloride: 105 mmol/L (ref 98–111)
Creatinine, Ser: 0.84 mg/dL (ref 0.61–1.24)
GFR, Estimated: >60 mL/min (ref >60)
Glucose, Bld: 267 mg/dL — ABNORMAL HIGH (ref 70–99)
Potassium: 4.1 mmol/L (ref 3.5–5.1)
Sodium: 134 mmol/L — ABNORMAL LOW (ref 135–145)

## 2022-01-24 LAB — LITHIUM LEVEL: Lithium Lvl: 0.35 mmol/L — ABNORMAL LOW (ref 0.60–1.20)

## 2022-01-24 NOTE — Addendum Note (Signed)
Addended by: Lonell Face C on: 01/24/2022 11:20 AM   Modules accepted: Orders

## 2022-01-24 NOTE — Telephone Encounter (Signed)
Patient called after being seen. He forgot to mention if you are able to write a generalized letter stating his overall mental condition that he is unable to work. When asked to whom it will go to, he just stated it would be "To whom it may concern." For further questions call him. Indicated to him that [employer] typically sends the request but states he just needs a letter to help get disability.

## 2022-01-24 NOTE — Patient Instructions (Addendum)
Continue sertraline 200 mg daily Continue Risperdal 4 mg at night Continue lithium 600 mg at night ( Continue clonidine 0.2 mg 3 times a day Hold Trazodone Referral to [facility] for therapy  Obtain labs  Obtain EKG  Referral to therapy  Next appointment: 2/6 at 11:30

## 2022-02-04 ENCOUNTER — Emergency Department (HOSPITAL_COMMUNITY): Payer: Medicaid Other

## 2022-02-04 ENCOUNTER — Other Ambulatory Visit: Payer: Self-pay

## 2022-02-04 ENCOUNTER — Encounter (HOSPITAL_COMMUNITY): Payer: Self-pay | Admitting: Emergency Medicine

## 2022-02-04 ENCOUNTER — Emergency Department (HOSPITAL_COMMUNITY)
Admission: EM | Admit: 2022-02-04 | Discharge: 2022-02-05 | Discharge status: Against Medical Advice | Payer: Medicaid Other | Attending: Emergency Medicine | Admitting: Emergency Medicine

## 2022-02-04 DIAGNOSIS — Z1152 Encounter for screening for COVID-19: Secondary | ICD-10-CM | POA: Insufficient documentation

## 2022-02-04 DIAGNOSIS — R059 Cough, unspecified: Secondary | ICD-10-CM | POA: Diagnosis not present

## 2022-02-04 DIAGNOSIS — R202 Paresthesia of skin: Secondary | ICD-10-CM | POA: Insufficient documentation

## 2022-02-04 DIAGNOSIS — R0602 Shortness of breath: Secondary | ICD-10-CM | POA: Insufficient documentation

## 2022-02-04 DIAGNOSIS — Z5321 Procedure and treatment not carried out due to patient leaving prior to being seen by health care provider: Secondary | ICD-10-CM | POA: Diagnosis not present

## 2022-02-04 LAB — RESP PANEL BY RT-PCR (RSV, FLU A&B, COVID)  RVPGX2
Influenza A by PCR: NEGATIVE
Influenza B by PCR: NEGATIVE
Resp Syncytial Virus by PCR: NEGATIVE
SARS Coronavirus 2 by RT PCR: NEGATIVE

## 2022-02-04 LAB — CBC WITH DIFFERENTIAL/PLATELET
Abs Immature Granulocytes: 0.02 10*3/uL (ref 0.00–0.07)
Basophils Absolute: 0 10*3/uL (ref 0.0–0.1)
Basophils Relative: 0 %
Eosinophils Absolute: 0.2 10*3/uL (ref 0.0–0.5)
Eosinophils Relative: 3 %
HCT: 43.1 % (ref 39.0–52.0)
Hemoglobin: 13.5 g/dL (ref 13.0–17.0)
Immature Granulocytes: 0 %
Lymphocytes Relative: 24 %
Lymphs Abs: 1.2 10*3/uL (ref 0.7–4.0)
MCH: 24.6 pg — ABNORMAL LOW (ref 26.0–34.0)
MCHC: 31.3 g/dL (ref 30.0–36.0)
MCV: 78.6 fL — ABNORMAL LOW (ref 80.0–100.0)
Monocytes Absolute: 0.3 10*3/uL (ref 0.1–1.0)
Monocytes Relative: 6 %
Neutro Abs: 3.4 10*3/uL (ref 1.7–7.7)
Neutrophils Relative %: 67 %
Platelets: 210 10*3/uL (ref 150–400)
RBC: 5.48 MIL/uL (ref 4.22–5.81)
RDW: 17.2 % — ABNORMAL HIGH (ref 11.5–15.5)
WBC: 5.2 10*3/uL (ref 4.0–10.5)
nRBC: 0 % (ref 0.0–0.2)

## 2022-02-04 LAB — BASIC METABOLIC PANEL
Anion gap: 11 (ref 5–15)
BUN: 5 mg/dL — ABNORMAL LOW (ref 6–20)
CO2: 22 mmol/L (ref 22–32)
Calcium: 8.7 mg/dL — ABNORMAL LOW (ref 8.9–10.3)
Chloride: 100 mmol/L (ref 98–111)
Creatinine, Ser: 0.93 mg/dL (ref 0.61–1.24)
GFR, Estimated: >60 mL/min (ref >60)
Glucose, Bld: 307 mg/dL — ABNORMAL HIGH (ref 70–99)
Potassium: 3.9 mmol/L (ref 3.5–5.1)
Sodium: 133 mmol/L — ABNORMAL LOW (ref 135–145)

## 2022-02-04 NOTE — ED Provider Triage Note (Addendum)
Emergency Medicine Provider Triage Evaluation Note  Herbert Walsh , a 31 y.o. male  was evaluated in triage.  Pt complains of cough and sob  x 1 week. Started doxycycline from PCP last week to treat pneumonia after virtual visit. Concerned for RSV and pneumonia due to positive sick contact at Christmas party. Nonspecific tingling to bilateral sides of face. Intermittent for at least 2 weeks. No fever, nausea, vomiting, diarrhea, abdominal pain, headache, focal weakness.  Review of Systems  Positive: See HPI Negative: See HPI  Physical Exam  BP 138/83   Pulse (!) 117   Temp 98.4 F (36.9 C) (Oral)   Resp 20   SpO2 99%  Gen:   Awake, no distress   Resp:  Normal effort, LCTA, coarse nonproductive cough MSK:   Moves extremities without difficulty, no LE edema Other:  Pressured speech, tachycardia with speaking but improves at rest, Equal strength 5/5 bilateral UE and LE, CNI, inconsistent subjective reports of decreased sensation alternating between each side when tested, alert and oriented x 4  Medical Decision Making  Medically screening exam initiated at 6:02 PM.  Appropriate orders placed.  [name] was informed that the remainder of the evaluation will be completed by another provider, this initial triage assessment does not replace that evaluation, and the importance of remaining in the ED until their evaluation is complete.     Tonette Lederer, PA-C 02/04/22 1812    Tonette Lederer, PA-C 02/04/22 1813

## 2022-02-04 NOTE — ED Triage Notes (Signed)
Patient here suspecting he may have PNA or RSV. Patient also complaining that he is having visual field changes that he noticed yesterday at 7 p.m. He states he feels like half of his vision to the L side is cut in half. Patient complains of shortness of breath stating "I feel like my left lung is there but not there" this has been ongoing for a week after he attended a christmas party where he had a sick contact. Contacted his MD and he was given doxy for suspected PNA. Also has a cough which he is treating with dayquil. Patient requesting MRI w/ contrast of his brain.

## 2022-02-21 NOTE — Telephone Encounter (Signed)
Message closed have left 2 message and no return call back.

## 2022-03-01 ENCOUNTER — Ambulatory Visit: Payer: Medicaid Other | Admitting: Licensed Clinical Social Worker
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
## 2022-03-21 ENCOUNTER — Encounter: Payer: Self-pay | Admitting: Psychiatry

## 2022-03-21 ENCOUNTER — Ambulatory Visit (INDEPENDENT_AMBULATORY_CARE_PROVIDER_SITE_OTHER): Payer: No Typology Code available for payment source | Admitting: Psychiatry

## 2022-03-21 VITALS — BP 140/86 | HR 100 | Temp 98.5°F | Ht 71.5 in | Wt 268.8 lb

## 2022-03-21 DIAGNOSIS — F331 Major depressive disorder, recurrent, moderate: Secondary | ICD-10-CM

## 2022-03-21 DIAGNOSIS — F6381 Intermittent explosive disorder: Secondary | ICD-10-CM

## 2022-03-21 DIAGNOSIS — F4310 Post-traumatic stress disorder, unspecified: Secondary | ICD-10-CM

## 2022-03-21 NOTE — Patient Instructions (Signed)
Continue sertraline 200 mg daily Continue Risperdal 4 mg at night  Continue lithium 600 mg at night  Continue clonidine 0.2 mg 3 times a day Referral to therapy  (watilist) Next appointment: 3/27 at 3 PM

## 2022-03-29 ENCOUNTER — Other Ambulatory Visit: Payer: Self-pay | Admitting: Psychiatry

## 2022-04-17 ENCOUNTER — Other Ambulatory Visit: Payer: Self-pay | Admitting: Psychiatry

## 2022-04-17 DIAGNOSIS — F6381 Intermittent explosive disorder: Secondary | ICD-10-CM

## 2022-04-24 ENCOUNTER — Telehealth: Payer: Self-pay | Admitting: Licensed Clinical Social Worker

## 2022-04-24 ENCOUNTER — Ambulatory Visit (INDEPENDENT_AMBULATORY_CARE_PROVIDER_SITE_OTHER): Payer: No Typology Code available for payment source | Admitting: Licensed Clinical Social Worker

## 2022-04-24 DIAGNOSIS — Z91199 Patient's noncompliance with other medical treatment and regimen due to unspecified reason: Secondary | ICD-10-CM

## 2022-04-24 NOTE — Telephone Encounter (Signed)
LCSW called pt at 2:04PM and left a vm inquring about pt attendance at today's appointment. Pt has not called back. Pt encouraged to call Otila Kluver to reschedule. Pt marked as no show.

## 2022-04-24 NOTE — Progress Notes (Signed)
LCSW called pt at 2:04PM and left a vm inquring about pt attendance at today's appointment. Pt has not called back. Pt encouraged to call Otila Kluver to reschedule. Pt marked as no show.
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
## 2022-05-10 ENCOUNTER — Encounter: Payer: Self-pay | Admitting: Psychiatry

## 2022-05-10 ENCOUNTER — Telehealth (INDEPENDENT_AMBULATORY_CARE_PROVIDER_SITE_OTHER): Payer: No Typology Code available for payment source | Admitting: Psychiatry

## 2022-05-10 DIAGNOSIS — F331 Major depressive disorder, recurrent, moderate: Secondary | ICD-10-CM | POA: Diagnosis not present

## 2022-05-10 DIAGNOSIS — F6381 Intermittent explosive disorder: Secondary | ICD-10-CM | POA: Diagnosis not present

## 2022-05-10 DIAGNOSIS — F4310 Post-traumatic stress disorder, unspecified: Secondary | ICD-10-CM

## 2022-05-10 NOTE — Patient Instructions (Addendum)
Continue sertraline 200 mg daily Continue Risperdal 4 mg at night  Continue lithium 600 mg at night  Continue clonidine 0.2 mg 3 times a day Obtain labs - tsh, BMP, lithium Next appointment: 5/22 at 2 30

## 2022-05-12 ENCOUNTER — Encounter: Payer: Self-pay | Admitting: Psychiatry

## 2022-05-12 LAB — BASIC METABOLIC PANEL
BUN/Creatinine Ratio: 5 — ABNORMAL LOW (ref 9–20)
BUN: 4 mg/dL — ABNORMAL LOW (ref 6–20)
CO2: 18 mmol/L — ABNORMAL LOW (ref 20–29)
Calcium: 9.7 mg/dL (ref 8.7–10.2)
Chloride: 99 mmol/L (ref 96–106)
Creatinine, Ser: 0.86 mg/dL (ref 0.76–1.27)
Glucose: 216 mg/dL — ABNORMAL HIGH (ref 70–99)
Potassium: 4.3 mmol/L (ref 3.5–5.2)
Sodium: 135 mmol/L (ref 134–144)
eGFR: 117 mL/min/{1.73_m2} (ref >59)

## 2022-05-12 LAB — LITHIUM LEVEL: Lithium Lvl: 0.5 mmol/L (ref 0.5–1.2)

## 2022-05-12 LAB — TSH: TSH: 2.91 u[IU]/mL (ref 0.450–4.500)

## 2022-05-24 ENCOUNTER — Ambulatory Visit: Payer: Medicaid Other | Admitting: Diagnostic Neuroimaging

## 2022-05-24 ENCOUNTER — Encounter: Payer: Self-pay | Admitting: Diagnostic Neuroimaging

## 2022-05-24 VITALS — BP 103/72 | HR 90 | Ht 71.0 in | Wt 271.2 lb

## 2022-05-24 DIAGNOSIS — G4440 Drug-induced headache, not elsewhere classified, not intractable: Secondary | ICD-10-CM

## 2022-05-24 DIAGNOSIS — F1510 Other stimulant abuse, uncomplicated: Secondary | ICD-10-CM | POA: Diagnosis not present

## 2022-05-24 DIAGNOSIS — R4189 Other symptoms and signs involving cognitive functions and awareness: Secondary | ICD-10-CM

## 2022-05-24 NOTE — Patient Instructions (Signed)
BRAIN FOG / HEADACHES / INSOMNIA - likely related to underlying caffeine overuse, recreational drug abuse (cough medication), bipolar disorder, polypharmacy - follow up with psychiatry

## 2022-05-24 NOTE — Progress Notes (Signed)
[facility]  PATIENT: Herbert Walsh DOB: 1990-12-09  REFERRING CLINICIAN: Neysa Hotter, MD HISTORY FROM: patient  REASON FOR VISIT: new consult    HISTORICAL  CHIEF COMPLAINT:  Chief Complaint  Patient presents with   New Patient (Initial Visit)    Patient in room #7 and alone. Patient states he's been having headaches that travels from the right side to his left. Pt states sometime it hard for him to think and fogs at times. Pt state he has hx of drugs like: cough cold Rx. and It hard for him to sleep at night. Patient family members want the Dr to know there hx of POTS in the family.    HISTORY OF PRESENT ILLNESS:   32 year old male here for evaluation of brain fog, headaches, memory loss, insomnia, stress.  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  He has discussed this with his psychiatrist but he is not ready to quit.  Some days he stays in a fog and high from this medication all day.  For past 2 years has had some issues with headaches on the right side spreading to the left side.  Sometimes associated with blurred vision and nausea.  Patient also drinks 1 to 3 L of Pepsi per day.  Patient has PTSD, bipolar disorder, management psychiatry.  He lives at home.  He does not work outside the home.  Sometimes he feels hopeless without any purpose.   REVIEW OF SYSTEMS: Full 14 system review of systems performed and negative with exception of: as per HPI.  ALLERGIES: No Known Allergies  HOME MEDICATIONS: Outpatient Medications Prior to Visit  Medication Sig Dispense Refill   allopurinol (ZYLOPRIM) 300 MG tablet Take 300 mg by mouth daily.     atorvastatin (LIPITOR) 10 MG tablet Take 10 mg by mouth at bedtime.   12    Cholecalciferol (VITAMIN D3) 50 MCG (2000 UT) TABS Take 2,000 mg by mouth daily.     cloNIDine (CATAPRES) 0.2 MG tablet Take 1 tablet (0.2 mg total) by mouth 3 (three) times daily. 270 tablet 1   folic acid (FOLVITE) 1 MG tablet Take 1 mg by mouth daily.     glipiZIDE-metformin (METAGLIP) 5-500 MG tablet Take 1 tablet by mouth daily.     HUMIRA 40 MG/0.4ML PSKT SMARTSIG:40 Milligram(s) SUB-Q Once a Week     lisinopril (PRINIVIL,ZESTRIL) 10 MG tablet Take 10 mg by mouth daily.     lithium 600 MG capsule Take 1 capsule (600 mg total) by mouth at bedtime. 90 capsule 0   metroNIDAZOLE (FLAGYL) 500 MG tablet Take 500 mg by mouth 3 (three) times daily.     naproxen (NAPROSYN) 500 MG tablet Take 500 mg by mouth 2 (two) times daily with a meal.     omeprazole (PRILOSEC) 40 MG capsule Take 1 capsule by mouth every morning.     risperidone (RISPERDAL) 4 MG tablet TAKE 1 TABLET BY MOUTH AT BEDTIME 90 tablet 0   sertraline (ZOLOFT) 100 MG tablet Take 2 tablets (200 mg total) by mouth daily. 180 tablet 1   zinc gluconate 50 MG tablet Take 50 mg by mouth daily.     metFORMIN (GLUCOPHAGE) 500 MG tablet Take 500 mg by mouth daily. (Patient not taking: Reported on  05/24/2022)     No facility-administered medications prior to visit.    PAST MEDICAL HISTORY: Past Medical History:  Diagnosis Date   Anemia    Arthritis    rheumatoid   Asthma    Depression    GERD (gastroesophageal reflux disease)    History of COVID-19 03/29/2020   Hypertension 2019   Ischemic colitis 09/2008   MSSA (methicillin susceptible Staphylococcus aureus)    sternaoclavicular absecess drainage   Osteoarthritis    sterum and right shoulder   PTSD (post-traumatic stress disorder)    Substance use disorder    Tachycardia     PAST SURGICAL HISTORY: Past Surgical History:  Procedure Laterality Date   APPENDECTOMY     APPLICATION OF WOUND VAC Right 06/20/2019   Procedure: APPLICATION OF WOUND VAC;  Surgeon: Kerin PernaVan Trigt, Peter,  MD;  [facility];  Service: Vascular;  Laterality: Right;   APPLICATION OF WOUND VAC Right 06/23/2019   Procedure: APPLICATION OF WOUND VAC;  Surgeon: Kerin PernaVan Trigt, Peter, MD;  [facility];  Service: Thoracic;  Laterality: Right;   COLONOSCOPY     x 2   I & D EXTREMITY Right 05/31/2019   Procedure: IRRIGATION AND DEBRIDEMENT LOWER EXTREMITY;  Surgeon: Tarry KosXu, Naiping M, MD;  Location: [facility] OR;  Service: Orthopedics;  Laterality: Right;   I & D EXTREMITY Right 06/20/2019   Procedure: DEBRIDEMENT OF STERNOCLAVICULAR JOINT ABSCESS;  Surgeon: Kerin PernaVan Trigt, Peter, MD;  [facility];  Service: Vascular;  Laterality: Right;   INCISION AND DRAINAGE OF WOUND Right 04/12/2020   Procedure: excision of right sternoclavicular joint infection;  Surgeon: Peggye Formillingham, Claire S, DO;  Location: [facility];  Service: Plastics;  Laterality: Right;  45 min total   STERNAL WOUND DEBRIDEMENT Right 09/19/2019   Procedure: EXCISIONAL DEBRIDEMENT AND IRRIGATION OF RIGHT STERNOCLAVICULAR JOINT WOUND;  Surgeon: Kerin PernaVan Trigt, Peter, MD;  [facility];  Service: Thoracic;  Laterality: Right;   TEE WITHOUT CARDIOVERSION N/A 06/05/2019   Procedure: TRANSESOPHAGEAL ECHOCARDIOGRAM (TEE);  Surgeon: Lewayne Buntingrenshaw, Brian S, MD;  [facility];  Service: Cardiovascular;  Laterality: N/A;   WOUND EXPLORATION Right 06/23/2019   Procedure: irrigation and clean out right shoulder;  Surgeon: Kerin PernaVan Trigt, Peter, MD;  [facility];  Service: Thoracic;  Laterality: Right;    FAMILY HISTORY: Family History  Problem Relation Age of Onset   Alcohol abuse Mother    Depression Mother    Drug abuse Mother    Physical abuse Mother    Sexual abuse Mother    ADD / ADHD Paternal Aunt    Alcohol abuse Maternal Grandfather    Bipolar disorder Maternal Grandmother    Schizophrenia Maternal Grandmother    Drug abuse Maternal Grandmother    Alcohol abuse Paternal Grandfather    Depression Paternal Grandfather    Depression Paternal  Grandmother    Drug abuse Paternal Grandmother     SOCIAL HISTORY: Social History   Socioeconomic History   Marital status: Legally Separated    Spouse name: Not on file   Number of children: 0   Years of education: Not on file   Highest education level: Some college, no degree  Occupational History   Not on file  Tobacco Use   Smoking status: Every Day    Packs/day: 1.00    Years: 15.00    Additional pack years: 0.00    Total pack years: 15.00    Types: Cigarettes   Smokeless tobacco: Never  Vaping Use  Vaping Use: Never used  Substance and Sexual Activity   Alcohol use: Not Currently    Comment: rare beer   Drug use: Not Currently    Types: Marijuana    Comment: cough med   Sexual activity: Not Currently  Other Topics Concern   Not on file  Social History Narrative   Not on file   Social Determinants of Health   Financial Resource Strain: Not on file  Food Insecurity: Not on file  Transportation Needs: Not on file  Physical Activity: Not on file  Stress: Not on file  Social Connections: Not on file  Intimate Partner Violence: Not on file     PHYSICAL EXAM  GENERAL EXAM/CONSTITUTIONAL: Vitals:  Vitals:   05/24/22 0835  BP: 103/72  Pulse: 90  Weight: 271 lb 3.2 oz (123 kg)  Height: 5\' 11"  (1.803 m)   Body mass index is 37.82 kg/m. Wt Readings from Last 3 Encounters:  05/24/22 271 lb 3.2 oz (123 kg)  03/21/22 268 lb 12.8 oz (121.9 kg)  01/24/22 268 lb 3.2 oz (121.7 kg)   Patient is in no distress; well developed, nourished and groomed; neck is supple  CARDIOVASCULAR: Examination of carotid arteries is normal; no carotid bruits Regular rate and rhythm, no murmurs Examination of peripheral vascular system by observation and palpation is normal  EYES: Ophthalmoscopic exam of optic discs and posterior segments is normal; no papilledema or hemorrhages No results found.  MUSCULOSKELETAL: Gait, strength, tone, movements noted in Neurologic exam  below  NEUROLOGIC: MENTAL STATUS:      No data to display         awake, alert, oriented to person, place and time recent and remote memory intact normal attention and concentration language fluent, comprehension intact, naming intact fund of knowledge appropriate  CRANIAL NERVE:  2nd - no papilledema on fundoscopic exam 2nd, 3rd, 4th, 6th - pupils equal and reactive to light, visual fields full to confrontation, extraocular muscles intact, no nystagmus 5th - facial sensation symmetric 7th - facial strength symmetric 8th - hearing intact 9th - palate elevates symmetrically, uvula midline 11th - shoulder shrug symmetric 12th - tongue protrusion midline  MOTOR:  normal bulk and tone, full strength in the BUE, BLE  SENSORY:  normal and symmetric to light touch, pinprick, temperature, vibration  COORDINATION:  finger-nose-finger, fine finger movements normal  REFLEXES:  deep tendon reflexes present and symmetric  GAIT/STATION:  narrow based gait; able to walk on toes, heels and tandem; romberg is negative     DIAGNOSTIC DATA (LABS, IMAGING, TESTING) - I reviewed patient records, labs, notes, testing and imaging myself where available.  Lab Results  Component Value Date   WBC 5.2 02/04/2022   HGB 13.5 02/04/2022   HCT 43.1 02/04/2022   MCV 78.6 (L) 02/04/2022   PLT 210 02/04/2022      Component Value Date/Time   NA 135 05/11/2022 1104   K 4.3 05/11/2022 1104   CL 99 05/11/2022 1104   CO2 18 (L) 05/11/2022 1104   GLUCOSE 216 (H) 05/11/2022 1104   GLUCOSE 307 (H) 02/04/2022 1810   BUN 4 (L) 05/11/2022 1104   CREATININE 0.86 05/11/2022 1104   CREATININE 0.98 12/16/2018 1028   CALCIUM 9.7 05/11/2022 1104   PROT 7.8 07/19/2020 1731   ALBUMIN 4.0 07/19/2020 1731   AST 33 07/19/2020 1731   ALT 18 07/19/2020 1731   ALKPHOS 107 07/19/2020 1731   BILITOT 0.4 07/19/2020 1731   GFRNONAA >60 02/04/2022 1810  GFRNONAA 111 08/06/2018 1522   GFRAA >60 09/19/2019  0716   GFRAA 129 08/06/2018 1522   Lab Results  Component Value Date   CHOL 189 07/07/2016   HDL 37 (L) 07/07/2016   LDLCALC 120 (H) 07/07/2016   TRIG 158 (H) 07/07/2016   CHOLHDL 5.1 07/07/2016   Lab Results  Component Value Date   HGBA1C 6.2 (H) 05/31/2019   No results found for: "VITAMINB12" Lab Results  Component Value Date   TSH 2.910 05/11/2022    08/12/21 MRI brain  1.  No acute intracranial abnormality. No structural abnormality identified to account for the patient's reported symptoms. A postcontrast MRI brain or face would improve sensitivity for pathology along the trigeminal nerve if clinically indicated.  2.  Small or atelectatic left maxillary sinus which is opacified. Correlate for sinusitis.     ASSESSMENT AND PLAN  32 y.o. year old male here with:   Dx:  1. Brain fog   2. Caffeine abuse   3. Analgesic overuse headache     PLAN:  BRAIN FOG / HEADACHES / INSOMNIA - likely related to underlying caffeine overuse, substance abuse (dextromethorphan), mood disorder, PTSD, polypharmacy - encouraged patient to think about and improve his substance abuse, mood disorder, nutrition, exercise, activities and sleep hygiene - follow up with psychiatry and PCP  Return for return to referring provider, pending if symptoms worsen or fail to improve.    Suanne Marker, MD 05/24/2022, 9:37 AM Certified in Neurology, Neurophysiology and Neuroimaging  [facility] [address], [address] [place], [place] [address] [phone]

## 2022-06-19 ENCOUNTER — Other Ambulatory Visit: Payer: Self-pay

## 2022-06-19 ENCOUNTER — Emergency Department (HOSPITAL_COMMUNITY): Payer: Medicaid Other

## 2022-06-19 ENCOUNTER — Encounter (HOSPITAL_COMMUNITY): Payer: Self-pay | Admitting: Emergency Medicine

## 2022-06-19 ENCOUNTER — Observation Stay (HOSPITAL_COMMUNITY)
Admission: EM | Admit: 2022-06-19 | Discharge: 2022-06-20 | Disposition: A | Discharge status: Home / Self Care | Payer: Medicaid Other | Attending: Family Medicine | Admitting: Family Medicine

## 2022-06-19 DIAGNOSIS — Z794 Long term (current) use of insulin: Secondary | ICD-10-CM | POA: Diagnosis not present

## 2022-06-19 DIAGNOSIS — F17210 Nicotine dependence, cigarettes, uncomplicated: Secondary | ICD-10-CM | POA: Diagnosis not present

## 2022-06-19 DIAGNOSIS — J45909 Unspecified asthma, uncomplicated: Secondary | ICD-10-CM | POA: Insufficient documentation

## 2022-06-19 DIAGNOSIS — J3489 Other specified disorders of nose and nasal sinuses: Secondary | ICD-10-CM | POA: Insufficient documentation

## 2022-06-19 DIAGNOSIS — Z7984 Long term (current) use of oral hypoglycemic drugs: Secondary | ICD-10-CM | POA: Insufficient documentation

## 2022-06-19 DIAGNOSIS — Z79899 Other long term (current) drug therapy: Secondary | ICD-10-CM | POA: Diagnosis not present

## 2022-06-19 DIAGNOSIS — I10 Essential (primary) hypertension: Secondary | ICD-10-CM | POA: Diagnosis not present

## 2022-06-19 DIAGNOSIS — R200 Anesthesia of skin: Principal | ICD-10-CM | POA: Diagnosis present

## 2022-06-19 DIAGNOSIS — Z8616 Personal history of COVID-19: Secondary | ICD-10-CM | POA: Insufficient documentation

## 2022-06-19 DIAGNOSIS — E119 Type 2 diabetes mellitus without complications: Secondary | ICD-10-CM | POA: Diagnosis not present

## 2022-06-19 DIAGNOSIS — F6381 Intermittent explosive disorder: Secondary | ICD-10-CM

## 2022-06-19 LAB — CBC
HCT: 43 % (ref 39.0–52.0)
Hemoglobin: 14.1 g/dL (ref 13.0–17.0)
MCH: 25.3 pg — ABNORMAL LOW (ref 26.0–34.0)
MCHC: 32.8 g/dL (ref 30.0–36.0)
MCV: 77.1 fL — ABNORMAL LOW (ref 80.0–100.0)
Platelets: 253 10*3/uL (ref 150–400)
RBC: 5.58 MIL/uL (ref 4.22–5.81)
RDW: 16.9 % — ABNORMAL HIGH (ref 11.5–15.5)
WBC: 11.3 10*3/uL — ABNORMAL HIGH (ref 4.0–10.5)
nRBC: 0 % (ref 0.0–0.2)

## 2022-06-19 LAB — DIFFERENTIAL
Abs Immature Granulocytes: 0.04 10*3/uL (ref 0.00–0.07)
Basophils Absolute: 0 10*3/uL (ref 0.0–0.1)
Basophils Relative: 0 %
Eosinophils Absolute: 0.3 10*3/uL (ref 0.0–0.5)
Eosinophils Relative: 2 %
Immature Granulocytes: 0 %
Lymphocytes Relative: 11 %
Lymphs Abs: 1.2 10*3/uL (ref 0.7–4.0)
Monocytes Absolute: 0.5 10*3/uL (ref 0.1–1.0)
Monocytes Relative: 5 %
Neutro Abs: 9.2 10*3/uL — ABNORMAL HIGH (ref 1.7–7.7)
Neutrophils Relative %: 82 %

## 2022-06-19 LAB — COMPREHENSIVE METABOLIC PANEL
ALT: 27 U/L (ref 0–44)
AST: 28 U/L (ref 15–41)
Albumin: 4.3 g/dL (ref 3.5–5.0)
Alkaline Phosphatase: 92 U/L (ref 38–126)
Anion gap: 12 (ref 5–15)
BUN: 7 mg/dL (ref 6–20)
CO2: 19 mmol/L — ABNORMAL LOW (ref 22–32)
Calcium: 9.5 mg/dL (ref 8.9–10.3)
Chloride: 101 mmol/L (ref 98–111)
Creatinine, Ser: 0.91 mg/dL (ref 0.61–1.24)
GFR, Estimated: >60 mL/min (ref >60)
Glucose, Bld: 199 mg/dL — ABNORMAL HIGH (ref 70–99)
Potassium: 3.8 mmol/L (ref 3.5–5.1)
Sodium: 132 mmol/L — ABNORMAL LOW (ref 135–145)
Total Bilirubin: 0.5 mg/dL (ref 0.3–1.2)
Total Protein: 7.8 g/dL (ref 6.5–8.1)

## 2022-06-19 LAB — PROTIME-INR
INR: 1 (ref 0.8–1.2)
Prothrombin Time: 13.4 seconds (ref 11.4–15.2)

## 2022-06-19 LAB — URINALYSIS, ROUTINE W REFLEX MICROSCOPIC
Bilirubin Urine: NEGATIVE
Glucose, UA: 50 mg/dL — AB
Hgb urine dipstick: NEGATIVE
Ketones, ur: NEGATIVE mg/dL
Leukocytes,Ua: NEGATIVE
Nitrite: NEGATIVE
Protein, ur: NEGATIVE mg/dL
Specific Gravity, Urine: 1.005 (ref 1.005–1.030)
pH: 6 (ref 5.0–8.0)

## 2022-06-19 LAB — RAPID URINE DRUG SCREEN, HOSP PERFORMED
Amphetamines: NOT DETECTED
Barbiturates: NOT DETECTED
Benzodiazepines: NOT DETECTED
Cocaine: NOT DETECTED
Opiates: NOT DETECTED
Tetrahydrocannabinol: NOT DETECTED

## 2022-06-19 LAB — ETHANOL: Alcohol, Ethyl (B): <10 mg/dL (ref <10)

## 2022-06-19 LAB — MAGNESIUM: Magnesium: 1.7 mg/dL (ref 1.7–2.4)

## 2022-06-19 LAB — CBG MONITORING, ED: Glucose-Capillary: 252 mg/dL — ABNORMAL HIGH (ref 70–99)

## 2022-06-19 MED ORDER — ASPIRIN 325 MG PO TABS
325.0000 mg | ORAL_TABLET | Freq: Once | ORAL | Status: AC
  Administered 2022-06-19: 325 mg via ORAL
  Filled 2022-06-19: qty 1

## 2022-06-19 MED ORDER — SALINE SPRAY 0.65 % NA SOLN: 1.0000 | NASAL | Status: DC | PRN

## 2022-06-19 MED ORDER — FLUTICASONE PROPIONATE 50 MCG/ACT NA SUSP
2.0000 | Freq: Every day | NASAL | Status: DC
  Administered 2022-06-20: 2 via NASAL
  Filled 2022-06-19: qty 16

## 2022-06-19 MED ORDER — AZITHROMYCIN 250 MG PO TABS
500.0000 mg | ORAL_TABLET | Freq: Once | ORAL | Status: AC
  Administered 2022-06-19: 500 mg via ORAL
  Filled 2022-06-19: qty 2

## 2022-06-19 MED ORDER — IOHEXOL 350 MG/ML SOLN
100.0000 mL | Freq: Once | INTRAVENOUS | Status: AC | PRN
  Administered 2022-06-19: 100 mL via INTRAVENOUS

## 2022-06-19 NOTE — Progress Notes (Addendum)
CODE STROKE  Elert @ 2026 Patient in CT already  Per bedside nurse Wilkie Aye, patient presents with left side weakness and numbness. GEXB@ 1800 per mother. mRs 0 No thinners No recent surgeries Patient already in CT (2028 per bedside RN) Back from CT @ 2033 Paged [facility] @ 2033 Dr. Manson Passey [facility] on screen @ 2039 NCCT read @ 2039 Relayed NCCT results to Dr. Manson Passey @ 2048 negative for ICH with ASPECTS 10 Dr. Manson Passey off screen @ 2052 to follow-up with EDP, to order advanced imaging Patient to CT @ 2100 after IV placement  Relayed CTA/CTP read to Dr. Manson Passey @ 2131

## 2022-06-19 NOTE — H&P (Signed)
TRH H&P    Patient Demographics:    Herbert Walsh, is a 32 y.o. male  MRN: [id number]  DOB - 08-26-1990  Admit Date - 06/19/2022  Referring MD/NP/PA: Gloris Manchester  Outpatient Primary MD for the patient is Ignatius Specking, MD  Patient coming from: Home  Chief complaint-left-sided facial numbness   HPI:    Herbert Walsh  is a 32 y.o. male, with history of rheumatoid arthritis, asthma, substance abuse, hypertension presented with left-sided facial numbness.  Patient also states that he has developed weakness of left side of the face.  Patient states that he takes dextromethorphan 30 mg tablets, every day.  He took 32 tablets in the past 24 hours.  Patient says that since that time he has developed numbness of left side of the face. Denies focal weakness of upper or lower extremities.  Denies blurred vision.  Denies numbness of extremities. In the ED code stroke was called, teleneurology evaluated the patient.  CT of the head was unremarkable, CTA head and neck did not show large vessel occlusion. Patient denies chest pain or shortness of breath Had injury to left maxillary sinus, CT head showed opacification of left maxillary sinus. Denies nausea vomiting or diarrhea Complains of left upper quadrant pain which has resolved.    Review of systems:    In addition to the HPI above,   All other systems reviewed and are negative.    Past History of the following :    Past Medical History:  Diagnosis Date   Anemia    Arthritis    rheumatoid   Asthma    Depression    GERD (gastroesophageal reflux disease)    History of COVID-19 03/29/2020   Hypertension 2019   Ischemic colitis (HCC) 09/2008   MSSA (methicillin susceptible Staphylococcus aureus)    sternaoclavicular absecess drainage   Osteoarthritis    sterum and right shoulder   PTSD (post-traumatic stress disorder)    Substance use disorder    Tachycardia        Past Surgical History:  Procedure Laterality Date   APPENDECTOMY     APPLICATION OF WOUND VAC Right 06/20/2019   Procedure: APPLICATION OF WOUND VAC;  Surgeon: [name], MD;  Location: [facility] OR;  Service: Vascular;  Laterality: Right;   APPLICATION OF WOUND VAC Right 06/23/2019   Procedure: APPLICATION OF WOUND VAC;  Surgeon: [name], MD;  Location: [facility] OR;  Service: Thoracic;  Laterality: Right;   COLONOSCOPY     x 2   I & D EXTREMITY Right 05/31/2019   Procedure: IRRIGATION AND DEBRIDEMENT LOWER EXTREMITY;  Surgeon: Tarry Kos, MD;  Location: [facility] OR;  Service: Orthopedics;  Laterality: Right;   I & D EXTREMITY Right 06/20/2019   Procedure: DEBRIDEMENT OF STERNOCLAVICULAR JOINT ABSCESS;  Surgeon: [name], MD;  Location: [facility] OR;  Service: Vascular;  Laterality: Right;   INCISION AND DRAINAGE OF WOUND Right 04/12/2020   Procedure: excision of right sternoclavicular joint infection;  Surgeon: Peggye Form, DO;  Location: [facility];  Service: Plastics;  Laterality: Right;  45 min total   STERNAL WOUND DEBRIDEMENT Right 09/19/2019   Procedure: EXCISIONAL DEBRIDEMENT AND IRRIGATION OF RIGHT STERNOCLAVICULAR JOINT WOUND;  Surgeon: [name], MD;  Location: [facility] OR;  Service: Thoracic;  Laterality: Right;   TEE WITHOUT CARDIOVERSION N/A 06/05/2019   Procedure: TRANSESOPHAGEAL ECHOCARDIOGRAM (TEE);  Surgeon: Lewayne Bunting, MD;  Location: [facility] ENDOSCOPY;  Service: Cardiovascular;  Laterality: N/A;   WOUND EXPLORATION Right 06/23/2019   Procedure: irrigation and clean out right shoulder;  Surgeon: [name], MD;  Location: [facility] OR;  Service: Thoracic;  Laterality: Right;      Social History:      Social History   Tobacco Use   Smoking status: Every Day    Packs/day: 1.00    Years: 15.00    Additional pack years: 0.00    Total pack years: 15.00    Types: Cigarettes   Smokeless tobacco: Never  Substance Use Topics   Alcohol use: Not  Currently    Comment: rare beer       Family History :     Family History  Problem Relation Age of Onset   Alcohol abuse Mother    Depression Mother    Drug abuse Mother    Physical abuse Mother    Sexual abuse Mother    ADD / ADHD Paternal Aunt    Alcohol abuse Maternal Grandfather    Bipolar disorder Maternal Grandmother    Schizophrenia Maternal Grandmother    Drug abuse Maternal Grandmother    Alcohol abuse Paternal Grandfather    Depression Paternal Grandfather    Depression Paternal Grandmother    Drug abuse Paternal Grandmother       Home Medications:   Prior to Admission medications   Medication Sig Start Date End Date Taking? Authorizing Provider  allopurinol (ZYLOPRIM) 300 MG tablet Take 300 mg by mouth daily. 10/31/21   [provider]  atorvastatin (LIPITOR) 10 MG tablet Take 10 mg by mouth at bedtime.  09/25/17   [provider]  Cholecalciferol (VITAMIN D3) 50 MCG (2000 UT) TABS Take 2,000 mg by mouth daily.    [provider]  cloNIDine (CATAPRES) 0.2 MG tablet Take 1 tablet (0.2 mg total) by mouth 3 (three) times daily. 12/23/21 06/21/22  Neysa Hotter, MD  folic acid (FOLVITE) 1 MG tablet Take 1 mg by mouth daily. 06/10/20   [provider]  glipiZIDE-metformin (METAGLIP) 5-500 MG tablet Take 1 tablet by mouth daily.    [provider]  HUMIRA 40 MG/0.4ML PSKT SMARTSIG:40 Milligram(s) SUB-Q Once a Week    [provider]  lisinopril (PRINIVIL,ZESTRIL) 10 MG tablet Take 10 mg by mouth daily.    [provider]  lithium 600 MG capsule Take 1 capsule (600 mg total) by mouth at bedtime. 03/29/22 06/27/22  Neysa Hotter, MD  metFORMIN (GLUCOPHAGE) 500 MG tablet Take 500 mg by mouth daily. Patient not taking: Reported on 05/24/2022    [provider]  metroNIDAZOLE (FLAGYL) 500 MG tablet Take 500 mg by mouth 3 (three) times daily.    [provider]  naproxen (NAPROSYN) 500 MG tablet Take  500 mg by mouth 2 (two) times daily with a meal.    [provider]  omeprazole (PRILOSEC) 40 MG capsule Take 1 capsule by mouth every morning. 05/25/20   [provider]  risperidone (RISPERDAL) 4 MG tablet TAKE 1 TABLET BY MOUTH AT BEDTIME 04/17/22   Eappen, Levin Bacon,  MD  sertraline (ZOLOFT) 100 MG tablet Take 2 tablets (200 mg total) by mouth daily. 03/29/22 09/25/22  Neysa Hotter, MD  zinc gluconate 50 MG tablet Take 50 mg by mouth daily.    [provider]     Allergies:    No Known Allergies   Physical Exam:   Vitals  Blood pressure (!) 137/92, pulse (!) 114, temperature 98.9 F (37.2 C), temperature source Oral, resp. rate (!) 34, height 5\' 11"  (1.803 m), weight 117.9 kg, SpO2 96 %.  1.  General: Appears in no acute distress  2. Psychiatric: Alert, oriented x 3, intact insight and judgment  3. Neurologic: Cranial nerves II through XII grossly intact,  left facial numbness, motor strength 5/5 in all extremities  4. HEENMT:  Atraumatic normocephalic, extraocular muscles are intact  5. Respiratory : Lungs are clear to auscultation bilaterally  6. Cardiovascular : S1-S2, regular, no murmur auscultated  7. Gastrointestinal:  Abdomen is soft, nontender, no organomegaly  8. Skin:  No rashes noted      Data Review:    CBC Recent Labs  Lab 06/19/22 2041  WBC 11.3*  HGB 14.1  HCT 43.0  PLT 253  MCV 77.1*  MCH 25.3*  MCHC 32.8  RDW 16.9*  LYMPHSABS 1.2  MONOABS 0.5  EOSABS 0.3  BASOSABS 0.0   ------------------------------------------------------------------------------------------------------------------  Results for orders placed or performed during the hospital encounter of 06/19/22 (from the past 48 hour(s))  POC CBG, ED     Status: Abnormal   Collection Time: 06/19/22  8:19 PM  Result Value Ref Range   Glucose-Capillary 252 (H) 70 - 99 mg/dL    Comment: Glucose reference range applies only to samples taken after fasting for  at least 8 hours.  Protime-INR     Status: None   Collection Time: 06/19/22  8:41 PM  Result Value Ref Range   Prothrombin Time 13.4 11.4 - 15.2 seconds   INR 1.0 0.8 - 1.2    Comment: (NOTE) INR goal varies based on device and disease states. Performed at [facility], [address] [place], [place] [address]   CBC     Status: Abnormal   Collection Time: 06/19/22  8:41 PM  Result Value Ref Range   WBC 11.3 (H) 4.0 - 10.5 K/uL   RBC 5.58 4.22 - 5.81 MIL/uL   Hemoglobin 14.1 13.0 - 17.0 g/dL   HCT 60.4 54.0 - 98.1 %   MCV 77.1 (L) 80.0 - 100.0 fL   MCH 25.3 (L) 26.0 - 34.0 pg   MCHC 32.8 30.0 - 36.0 g/dL   RDW 19.1 (H) 47.8 - 29.5 %   Platelets 253 150 - 400 K/uL   nRBC 0.0 0.0 - 0.2 %    Comment: Performed at [facility], [address], [place], [place] [address]  Differential     Status: Abnormal   Collection Time: 06/19/22  8:41 PM  Result Value Ref Range   Neutrophils Relative % 82 %   Neutro Abs 9.2 (H) 1.7 - 7.7 K/uL   Lymphocytes Relative 11 %   Lymphs Abs 1.2 0.7 - 4.0 K/uL   Monocytes Relative 5 %   Monocytes Absolute 0.5 0.1 - 1.0 K/uL   Eosinophils Relative 2 %   Eosinophils Absolute 0.3 0.0 - 0.5 K/uL   Basophils Relative 0 %   Basophils Absolute 0.0 0.0 - 0.1 K/uL   Immature Granulocytes 0 %   Abs Immature Granulocytes 0.04 0.00 - 0.07 K/uL  Comment: Performed at [facility], [address], [place], [place] [address]  Comprehensive metabolic panel     Status: Abnormal   Collection Time: 06/19/22  8:41 PM  Result Value Ref Range   Sodium 132 (L) 135 - 145 mmol/L   Potassium 3.8 3.5 - 5.1 mmol/L   Chloride 101 98 - 111 mmol/L   CO2 19 (L) 22 - 32 mmol/L   Glucose, Bld 199 (H) 70 - 99 mg/dL    Comment: Glucose reference range applies only to samples taken after fasting for at least 8 hours.   BUN 7 6 - 20 mg/dL   Creatinine, Ser 6.04 0.61 - 1.24 mg/dL   Calcium 9.5 8.9 - 54.0 mg/dL   Total Protein 7.8 6.5 - 8.1 g/dL   Albumin 4.3 3.5 - 5.0 g/dL    AST 28 15 - 41 U/L   ALT 27 0 - 44 U/L   Alkaline Phosphatase 92 38 - 126 U/L   Total Bilirubin 0.5 0.3 - 1.2 mg/dL   GFR, Estimated >98 >11 mL/min    Comment: (NOTE) Calculated using the CKD-EPI Creatinine Equation (2021)    Anion gap 12 5 - 15    Comment: Performed at [facility], [address], [place], [place] [address]  Ethanol     Status: None   Collection Time: 06/19/22  8:41 PM  Result Value Ref Range   Alcohol, Ethyl (B) <10 <10 mg/dL    Comment: (NOTE) Lowest detectable limit for serum alcohol is 10 mg/dL.  For medical purposes only. Performed at [facility], [address], [place], [place] [address]   Urine rapid drug screen (hosp performed)     Status: None   Collection Time: 06/19/22  8:41 PM  Result Value Ref Range   Opiates NONE DETECTED NONE DETECTED   Cocaine NONE DETECTED NONE DETECTED   Benzodiazepines NONE DETECTED NONE DETECTED   Amphetamines NONE DETECTED NONE DETECTED   Tetrahydrocannabinol NONE DETECTED NONE DETECTED   Barbiturates NONE DETECTED NONE DETECTED    Comment: (NOTE) DRUG SCREEN FOR MEDICAL PURPOSES ONLY.  IF CONFIRMATION IS NEEDED FOR ANY PURPOSE, NOTIFY LAB WITHIN 5 DAYS.  LOWEST DETECTABLE LIMITS FOR URINE DRUG SCREEN Drug Class                     Cutoff (ng/mL) Amphetamine and metabolites    1000 Barbiturate and metabolites    200 Benzodiazepine                 200 Opiates and metabolites        300 Cocaine and metabolites        300 THC                            50 Performed at [facility], [address] [place], [place] [address]   Urinalysis, Routine w reflex microscopic -Urine, Clean Catch     Status: Abnormal   Collection Time: 06/19/22  8:41 PM  Result Value Ref Range   Color, Urine STRAW (A) YELLOW   APPearance CLEAR CLEAR   Specific Gravity, Urine 1.005 1.005 - 1.030   pH 6.0 5.0 - 8.0   Glucose, UA 50 (A) NEGATIVE mg/dL   Hgb urine dipstick NEGATIVE NEGATIVE   Bilirubin Urine NEGATIVE NEGATIVE   Ketones, ur  NEGATIVE NEGATIVE mg/dL   Protein, ur NEGATIVE NEGATIVE mg/dL   Nitrite NEGATIVE NEGATIVE   Leukocytes,Ua NEGATIVE NEGATIVE  Comment: Performed at [facility], [address], [place], [place] [address]  Magnesium     Status: None   Collection Time: 06/19/22  8:41 PM  Result Value Ref Range   Magnesium 1.7 1.7 - 2.4 mg/dL    Comment: Performed at [facility], [address] [place], [place] [address]    Chemistries  Recent Labs  Lab 06/19/22 2041  NA 132*  K 3.8  CL 101  CO2 19*  GLUCOSE 199*  BUN 7  CREATININE 0.91  CALCIUM 9.5  MG 1.7  AST 28  ALT 27  ALKPHOS 92  BILITOT 0.5   ------------------------------------------------------------------------------------------------------------------  ------------------------------------------------------------------------------------------------------------------ GFR: Estimated Creatinine Clearance: 149.3 mL/min (by C-G formula based on SCr of 0.91 mg/dL). Liver Function Tests: Recent Labs  Lab 06/19/22 2041  AST 28  ALT 27  ALKPHOS 92  BILITOT 0.5  PROT 7.8  ALBUMIN 4.3   No results for input(s): "LIPASE", "AMYLASE" in the last 168 hours. No results for input(s): "AMMONIA" in the last 168 hours. Coagulation Profile: Recent Labs  Lab 06/19/22 2041  INR 1.0   Cardiac Enzymes: No results for input(s): "CKTOTAL", "CKMB", "CKMBINDEX", "TROPONINI" in the last 168 hours. BNP (last 3 results) No results for input(s): "PROBNP" in the last 8760 hours. HbA1C: No results for input(s): "HGBA1C" in the last 72 hours. CBG: Recent Labs  Lab 06/19/22 2019  GLUCAP 252*   Lipid Profile: No results for input(s): "CHOL", "HDL", "LDLCALC", "TRIG", "CHOLHDL", "LDLDIRECT" in the last 72 hours. Thyroid Function Tests: No results for input(s): "TSH", "T4TOTAL", "FREET4", "T3FREE", "THYROIDAB" in the last 72 hours. Anemia Panel: No results for input(s): "VITAMINB12", "FOLATE", "FERRITIN", "TIBC", "IRON", "RETICCTPCT" in the  last 72 hours.  --------------------------------------------------------------------------------------------------------------- Urine analysis:    Component Value Date/Time   COLORURINE STRAW (A) 06/19/2022 2041   APPEARANCEUR CLEAR 06/19/2022 2041   LABSPEC 1.005 06/19/2022 2041   PHURINE 6.0 06/19/2022 2041   GLUCOSEU 50 (A) 06/19/2022 2041   HGBUR NEGATIVE 06/19/2022 2041   BILIRUBINUR NEGATIVE 06/19/2022 2041   KETONESUR NEGATIVE 06/19/2022 2041   PROTEINUR NEGATIVE 06/19/2022 2041   UROBILINOGEN 0.2 07/04/2010 2016   NITRITE NEGATIVE 06/19/2022 2041   LEUKOCYTESUR NEGATIVE 06/19/2022 2041      Imaging Results:    CT ANGIO HEAD NECK W WO CM W PERF (CODE STROKE)  Result Date: 06/19/2022 CLINICAL DATA:  Left-sided numbness EXAM: CT ANGIOGRAPHY HEAD AND NECK CT PERFUSION BRAIN TECHNIQUE: Multidetector CT imaging of the head and neck was performed using the standard protocol during bolus administration of intravenous contrast. Multiplanar CT image reconstructions and MIPs were obtained to evaluate the vascular anatomy. Carotid stenosis measurements (when applicable) are obtained utilizing NASCET criteria, using the distal internal carotid diameter as the denominator. Multiphase CT imaging of the brain was performed following IV bolus contrast injection. Subsequent parametric perfusion maps were calculated using RAPID software. RADIATION DOSE REDUCTION: This exam was performed according to the departmental dose-optimization program which includes automated exposure control, adjustment of the mA and/or kV according to patient size and/or use of iterative reconstruction technique. CONTRAST:  OMNIPAQUE IOHEXOL 350 MG/ML SOLN COMPARISON:  None Available. FINDINGS: Foot CTA NECK FINDINGS SKELETON: There is no bony spinal canal stenosis. No lytic or blastic lesion. OTHER NECK: Normal pharynx, larynx and major salivary glands. No cervical lymphadenopathy. Unremarkable thyroid gland. UPPER  CHEST: No pneumothorax or pleural effusion. No nodules or masses. AORTIC ARCH: There is no calcific atherosclerosis of the aortic arch. There is no aneurysm, dissection or hemodynamically  significant stenosis of the visualized portion of the aorta. Conventional 3 vessel aortic branching pattern. The visualized proximal subclavian arteries are widely patent. RIGHT CAROTID SYSTEM: Normal without aneurysm, dissection or stenosis. LEFT CAROTID SYSTEM: Normal without aneurysm, dissection or stenosis. VERTEBRAL ARTERIES: Left dominant configuration. Both origins are clearly patent. There is no dissection, occlusion or flow-limiting stenosis to the skull base (V1-V3 segments). CTA HEAD FINDINGS POSTERIOR CIRCULATION: --Vertebral arteries: Normal V4 segments. --Inferior cerebellar arteries: Normal. --Basilar artery: Normal. --Superior cerebellar arteries: Normal. --Posterior cerebral arteries (PCA): Normal. ANTERIOR CIRCULATION: --Intracranial internal carotid arteries: Normal. --Anterior cerebral arteries (ACA): Normal. Both A1 segments are present. Patent anterior communicating artery (a-comm). --Middle cerebral arteries (MCA): Normal. VENOUS SINUSES: As permitted by contrast timing, patent. ANATOMIC VARIANTS: None Review of the MIP images confirms the above findings. CT Brain Perfusion Findings: ASPECTS: 10 CBF (<30%) Volume: 0mL Perfusion (Tmax>6.0s) volume: 0mL Mismatch Volume: 0mL Infarction Location:None IMPRESSION: 1. No emergent large vessel occlusion or high-grade stenosis of the intracranial arteries. 2. Normal CT perfusion. Electronically Signed   By: Deatra Robinson M.D.   On: 06/19/2022 21:27   CT HEAD CODE STROKE WO CONTRAST  Result Date: 06/19/2022 CLINICAL DATA:  Code stroke.  Left-sided weakness EXAM: CT HEAD WITHOUT CONTRAST TECHNIQUE: Contiguous axial images were obtained from the base of the skull through the vertex without intravenous contrast. RADIATION DOSE REDUCTION: This exam was performed  according to the departmental dose-optimization program which includes automated exposure control, adjustment of the mA and/or kV according to patient size and/or use of iterative reconstruction technique. COMPARISON:  None Available. FINDINGS: Brain: There is no mass, hemorrhage or extra-axial collection. The size and configuration of the ventricles and extra-axial CSF spaces are normal. The brain parenchyma is normal, without evidence of acute or chronic infarction. Vascular: No abnormal hyperdensity of the major intracranial arteries or dural venous sinuses. No intracranial atherosclerosis. Skull: The visualized skull base, calvarium and extracranial soft tissues are normal. Sinuses/Orbits: Maxillary sinus mucosal thickening. The orbits are normal. ASPECTS [facility] Stroke Program Early CT Score) - Ganglionic level infarction (caudate, lentiform nuclei, internal capsule, insula, M1-M3 cortex): 7 - Supraganglionic infarction (M4-M6 cortex): 3 Total score (0-10 with 10 being normal): 10 IMPRESSION: 1. No acute intracranial abnormality. 2. ASPECTS is 10. These results were called by telephone at the time of interpretation on 06/19/2022 at 8:39 pm to provider Gloris Manchester , who verbally acknowledged these results. Electronically Signed   By: Deatra Robinson M.D.   On: 06/19/2022 20:39    My personal review of EKG: Rhythm NSR, no ST-T changes   Assessment & Plan:    Principal Problem:   Facial numbness   Left facial numbness-concern for stroke, teleneurology evaluation done.  Recommend MRI brain.  CTA head and neck was negative for large vessel occlusion.  If MRI brain shows stroke then consider TTE.  Aspirin 325 mg p.o. x 1 given in the ED. Left maxillary sinus disease-CT head shows opacification of left maxillary sinus, patient will be started on Zithromax and Flonase for sinusitis. Substance abuse-patient states that he usually takes dextromethorphan tablets daily.  He took 32 tablets in last 24 hours.  TOC  consult for substance abuse counseling. Rheumatoid arthritis-patient says that he takes Humira as outpatient. Hypertension-will hold lisinopril, clonidine for permissive hypertension.  If blood pressure starts to rise, consider restarting clonidine. Diabetes mellitus type 2-hold glipizide/metformin.  Will start sliding scale insulin with NovoLog.    DVT Prophylaxis-   Lovenox   AM Labs Ordered, also  please review Full Orders  Family Communication: Admission, patients condition and plan of care including tests being ordered have been discussed with the patient and who indicate understanding and agree with the plan and Code Status.  Code Status: Full code  Admission status: Observation     Time spent in minutes : 60 min   Henreitta Spittler S Shawniece Oyola M.D

## 2022-06-19 NOTE — ED Triage Notes (Addendum)
Pt with c/o L sided numbness "from face to toes" for awhile and states it started to get worse today. Was seen by MD today. Pt also c/o blurred vision and a headache. Mom thinks pt has undiagnosed migraines. LKW was 6pm. Mom states pt has these symptoms frequently. Dr Durwin Nora in triage to evaluate pt.

## 2022-06-19 NOTE — Consult Note (Addendum)
[facility] TeleNeurology Consult Services   Patient Name:   Herbert Walsh, Herbert Walsh Date of Birth:   Oct 11, 1990 Identification Number:   MRN - [id number] Date of Service:   06/19/2022 20:33:59  Diagnosis:       R44.8 - Other and unspecified symptoms and signs involving general sensations and perceptions  Impression:      PT is a 89 y male with DM, HTN HLP who presented to the ED with left sided numbness since 18:00. NIHSS was 1 for left sided numbness. GIven pt's non-disabling deficits, the risks of iv-thrombolytics was felt to outweigh the potential benefit. CTA head and neck show no obvious LVO, but will follow-up with the final results.  Recommend admission for stroke work-up including MRI brain without contrast, telemetry, TTE if MRI brain shows a stroke, lipid panel and hemoglobin AIC and inpatient neurology consult. . Would start ASA 81 mg daily and allow permissive HTN up to 220/120 x 24 hours.    Our recommendations are outlined below.  Recommendations:        Stroke/Telemetry Floor       Neuro Checks       Bedside Swallow Eval       DVT Prophylaxis       IV Fluids, Normal Saline       Head of Bed 30 Degrees       Euglycemia and Avoid Hyperthermia (PRN Acetaminophen)       Initiate or continue Aspirin 81 MG daily       Antihypertensives PRN if Blood pressure is greater than 220/120 or there is a concern for End organ damage/contraindications for permissive HTN. If blood pressure is greater than 220/120 give labetalol PO or IV or Vasotec IV with a goal of 15% reduction in BP during the first 24 hours.       MRI brain without contrast       TTE if MRI brain shows a stroke.       Lipid panel       Hemoglobin AIC  Sign Out:       Discussed with Emergency Department Provider    ------------------------------------------------------------------------------  Advanced Imaging: CTA Head and Neck Completed.  CTP Completed.  LVO:No  Patient in not a candidate for  NIR   Metrics: Last Known Well: 06/19/2022 18:00:00 [facility] Notification Time: 06/19/2022 20:33:59 Arrival Time: 06/19/2022 19:35:00 Stamp Time: 06/19/2022 20:33:59 Initial Response Time: 06/19/2022 20:38:04 Symptoms: Left sided numbness. . Initial patient interaction: 06/19/2022 20:40:19 NIHSS Assessment Completed: 06/19/2022 20:44:05 Patient is not a candidate for Thrombolytic. Thrombolytic Medical Decision: 06/19/2022 20:47:23 Patient was not deemed candidate for Thrombolytic because of following reasons: No disabling symptoms.  CT head showed no acute hemorrhage or acute core infarct.  Primary Provider Notified of Diagnostic Impression and Management Plan on: 06/19/2022 21:10:55    ------------------------------------------------------------------------------  History of Present Illness: Patient is a 32 year old Male.  Patient was brought by private transportation with symptoms of Left sided numbness. . PT is a 53 y male with DM, HTN HLP who presented to the ED with left sided numbness since 18:00. PT reports that he was in his USOH until 18:00 when he developed numbness of his left, face and leg. HE reports he felt that his left side was weak as well like it was "disconnected" but he was still able to walk and use his arm. He also report a headache and blurred vision, no change in speech. PT has had episodes of numbness before which have been associated  with headache as well as overdosing on OTC cough medicine which he reports using today. PT was able to walk in triage.    Past Medical History:      Hypertension      Diabetes Mellitus      Migraine Headaches      There is no history of Coronary Artery Disease      There is no history of Stroke  Medications:  No Anticoagulant use  No Antiplatelet use Reviewed EMR for current medications  Allergies:  Reviewed  Social History: Smoking: Yes  Family History:  There is no family history of premature  cerebrovascular disease pertinent to this consultation  ROS : 14 Points Review of Systems was performed and was negative except mentioned in HPI.  Past Surgical History: There Is No Surgical History Contributory To Today's Visit     Examination: BP(142/87), Pulse(125), 1A: Level of Consciousness - Alert; keenly responsive + 0 1B: Ask Month and Age - Both Questions Right + 0 1C: Blink Eyes & Squeeze Hands - Performs Both Tasks + 0 2: Test Horizontal Extraocular Movements - Normal + 0 3: Test Visual Fields - No Visual Loss + 0 4: Test Facial Palsy (Use Grimace if Obtunded) - Normal symmetry + 0 5A: Test Left Arm Motor Drift - No Drift for 10 Seconds + 0 5B: Test Right Arm Motor Drift - No Drift for 10 Seconds + 0 6A: Test Left Leg Motor Drift - No Drift for 5 Seconds + 0 6B: Test Right Leg Motor Drift - No Drift for 5 Seconds + 0 7: Test Limb Ataxia (FNF/Heel-Shin) - No Ataxia + 0 8: Test Sensation - Mild-Moderate Loss: Less Sharp/More Dull + 1 9: Test Language/Aphasia - Normal; No aphasia + 0 10: Test Dysarthria - Normal + 0 11: Test Extinction/Inattention - No abnormality + 0  NIHSS Score: 1  NIHSS Free Text : NIHSS was 1 for left sided numbness.  Pre-Morbid Modified Rankin Scale: 0 Points = No symptoms at all  Spoke with : Gloris Manchester, M.D>  Patient/Family was informed the Neurology Consult would occur via TeleHealth consult by way of interactive audio and video telecommunications and consented to receiving care in this manner.   Patient is being evaluated for possible acute neurologic impairment and high probability of imminent or life-threatening deterioration. I spent total of 35 minutes providing care to this patient, including time for face to face visit via telemedicine, review of medical records, imaging studies and discussion of findings with providers, the patient and/or family.   Dr Jacky Kindle   [facility] For Inpatient follow-up with [facility]  physician please call [facility] [phone]. This is not an outpatient service. Post hospital discharge, please contact hospital directly.  Please do not communicate with [facility] physicians via secure chat. If you have any questions, Please contact [facility]. Please call or reconsult our service if there are any clinical or diagnostic changes.

## 2022-06-19 NOTE — ED Provider Notes (Signed)
[place] EMERGENCY DEPARTMENT AT [facility] Provider Note   CSN: [id number] Arrival date & time: 06/19/22  1935     History  Chief Complaint  Patient presents with   L sided numbness    Herbert Walsh is a 32 y.o. male.  HPI Patient presents for strokelike symptoms.  Medical history includes HTN, anemia, arthritis, asthma, GERD, substance use.  Approximately 2 hours prior to arrival, patient had onset of left hemibody numbness.  He experienced what he describes as weakness in the left side of his face.  Symptoms have persisted since onset.  Patient endorses daily use of over-the-counter cough medicine, including today.  He denies any other current substance use.    Home Medications Prior to Admission medications   Medication Sig Start Date End Date Taking? Authorizing Provider  allopurinol (ZYLOPRIM) 300 MG tablet Take 300 mg by mouth daily. 10/31/21   [provider]  atorvastatin (LIPITOR) 10 MG tablet Take 10 mg by mouth at bedtime.  09/25/17   [provider]  Cholecalciferol (VITAMIN D3) 50 MCG (2000 UT) TABS Take 2,000 mg by mouth daily.    [provider]  cloNIDine (CATAPRES) 0.2 MG tablet Take 1 tablet (0.2 mg total) by mouth 3 (three) times daily. 12/23/21 06/21/22  Neysa Hotter, MD  folic acid (FOLVITE) 1 MG tablet Take 1 mg by mouth daily. 06/10/20   [provider]  glipiZIDE-metformin (METAGLIP) 5-500 MG tablet Take 1 tablet by mouth daily.    [provider]  HUMIRA 40 MG/0.4ML PSKT SMARTSIG:40 Milligram(s) SUB-Q Once a Week    [provider]  lisinopril (PRINIVIL,ZESTRIL) 10 MG tablet Take 10 mg by mouth daily.    [provider]  lithium 600 MG capsule Take 1 capsule (600 mg total) by mouth at bedtime. 03/29/22 06/27/22  Neysa Hotter, MD  metFORMIN (GLUCOPHAGE) 500 MG tablet Take 500 mg by mouth daily. Patient not taking: Reported on 05/24/2022    [provider]  metroNIDAZOLE (FLAGYL)  500 MG tablet Take 500 mg by mouth 3 (three) times daily.    [provider]  naproxen (NAPROSYN) 500 MG tablet Take 500 mg by mouth 2 (two) times daily with a meal.    [provider]  omeprazole (PRILOSEC) 40 MG capsule Take 1 capsule by mouth every morning. 05/25/20   [provider]  risperidone (RISPERDAL) 4 MG tablet TAKE 1 TABLET BY MOUTH AT BEDTIME 04/17/22   Jomarie Longs, MD  sertraline (ZOLOFT) 100 MG tablet Take 2 tablets (200 mg total) by mouth daily. 03/29/22 09/25/22  Neysa Hotter, MD  zinc gluconate 50 MG tablet Take 50 mg by mouth daily.    [provider]      Allergies    Patient has no known allergies.    Review of Systems   Review of Systems  Neurological:  Positive for weakness and numbness.  All other systems reviewed and are negative.   Physical Exam Updated Vital Signs BP 127/88   Pulse (!) 119   Temp 98.9 F (37.2 C) (Oral)   Resp (!) 29   Ht 5\' 11"  (1.803 m)   Wt 117.9 kg   SpO2 98%   BMI 36.26 kg/m  Physical Exam Vitals and nursing note reviewed.  Constitutional:      General: He is not in acute distress.    Appearance: Normal appearance. He is well-developed. He is not ill-appearing, toxic-appearing or diaphoretic.  HENT:     Head: Normocephalic and  atraumatic.     Right Ear: External ear normal.     Left Ear: External ear normal.     Nose: Nose normal.     Mouth/Throat:     Mouth: Mucous membranes are moist.  Eyes:     Extraocular Movements: Extraocular movements intact.     Conjunctiva/sclera: Conjunctivae normal.  Cardiovascular:     Rate and Rhythm: Normal rate and regular rhythm.  Pulmonary:     Effort: Pulmonary effort is normal. No respiratory distress.  Abdominal:     General: There is no distension.     Palpations: Abdomen is soft.     Tenderness: There is no abdominal tenderness.  Musculoskeletal:        General: No swelling. Normal range of motion.     Cervical back: Normal range of motion  and neck supple.  Skin:    General: Skin is warm and dry.     Coloration: Skin is not jaundiced or pale.  Neurological:     Mental Status: He is alert.     Cranial Nerves: No cranial nerve deficit, dysarthria or facial asymmetry.     Sensory: Sensory deficit present.     Motor: Motor function is intact. No weakness, abnormal muscle tone or pronator drift.     Coordination: Coordination is intact. Finger-Nose-Finger Test normal.  Psychiatric:        Mood and Affect: Mood normal.        Behavior: Behavior normal.     ED Results / Procedures / Treatments   Labs (all labs ordered are listed, but only abnormal results are displayed) Labs Reviewed  CBC - Abnormal; Notable for the following components:      Result Value   WBC 11.3 (*)    MCV 77.1 (*)    MCH 25.3 (*)    RDW 16.9 (*)    All other components within normal limits  DIFFERENTIAL - Abnormal; Notable for the following components:   Neutro Abs 9.2 (*)    All other components within normal limits  COMPREHENSIVE METABOLIC PANEL - Abnormal; Notable for the following components:   Sodium 132 (*)    CO2 19 (*)    Glucose, Bld 199 (*)    All other components within normal limits  URINALYSIS, ROUTINE W REFLEX MICROSCOPIC - Abnormal; Notable for the following components:   Color, Urine STRAW (*)    Glucose, UA 50 (*)    All other components within normal limits  CBG MONITORING, ED - Abnormal; Notable for the following components:   Glucose-Capillary 252 (*)    All other components within normal limits  PROTIME-INR  ETHANOL  RAPID URINE DRUG SCREEN, HOSP PERFORMED  MAGNESIUM  I-STAT CHEM 8, ED    EKG None  Radiology CT ANGIO HEAD NECK W WO CM W PERF (CODE STROKE)  Result Date: 06/19/2022 CLINICAL DATA:  Left-sided numbness EXAM: CT ANGIOGRAPHY HEAD AND NECK CT PERFUSION BRAIN TECHNIQUE: Multidetector CT imaging of the head and neck was performed using the standard protocol during bolus administration of intravenous  contrast. Multiplanar CT image reconstructions and MIPs were obtained to evaluate the vascular anatomy. Carotid stenosis measurements (when applicable) are obtained utilizing NASCET criteria, using the distal internal carotid diameter as the denominator. Multiphase CT imaging of the brain was performed following IV bolus contrast injection. Subsequent parametric perfusion maps were calculated using RAPID software. RADIATION DOSE REDUCTION: This exam was performed according to the departmental dose-optimization program which includes automated exposure control, adjustment of the mA  and/or kV according to patient size and/or use of iterative reconstruction technique. CONTRAST:  OMNIPAQUE IOHEXOL 350 MG/ML SOLN COMPARISON:  None Available. FINDINGS: Foot CTA NECK FINDINGS SKELETON: There is no bony spinal canal stenosis. No lytic or blastic lesion. OTHER NECK: Normal pharynx, larynx and major salivary glands. No cervical lymphadenopathy. Unremarkable thyroid gland. UPPER CHEST: No pneumothorax or pleural effusion. No nodules or masses. AORTIC ARCH: There is no calcific atherosclerosis of the aortic arch. There is no aneurysm, dissection or hemodynamically significant stenosis of the visualized portion of the aorta. Conventional 3 vessel aortic branching pattern. The visualized proximal subclavian arteries are widely patent. RIGHT CAROTID SYSTEM: Normal without aneurysm, dissection or stenosis. LEFT CAROTID SYSTEM: Normal without aneurysm, dissection or stenosis. VERTEBRAL ARTERIES: Left dominant configuration. Both origins are clearly patent. There is no dissection, occlusion or flow-limiting stenosis to the skull base (V1-V3 segments). CTA HEAD FINDINGS POSTERIOR CIRCULATION: --Vertebral arteries: Normal V4 segments. --Inferior cerebellar arteries: Normal. --Basilar artery: Normal. --Superior cerebellar arteries: Normal. --Posterior cerebral arteries (PCA): Normal. ANTERIOR CIRCULATION: --Intracranial internal  carotid arteries: Normal. --Anterior cerebral arteries (ACA): Normal. Both A1 segments are present. Patent anterior communicating artery (a-comm). --Middle cerebral arteries (MCA): Normal. VENOUS SINUSES: As permitted by contrast timing, patent. ANATOMIC VARIANTS: None Review of the MIP images confirms the above findings. CT Brain Perfusion Findings: ASPECTS: 10 CBF (<30%) Volume: 0mL Perfusion (Tmax>6.0s) volume: 0mL Mismatch Volume: 0mL Infarction Location:None IMPRESSION: 1. No emergent large vessel occlusion or high-grade stenosis of the intracranial arteries. 2. Normal CT perfusion. Electronically Signed   By: Deatra Robinson M.D.   On: 06/19/2022 21:27   CT HEAD CODE STROKE WO CONTRAST  Result Date: 06/19/2022 CLINICAL DATA:  Code stroke.  Left-sided weakness EXAM: CT HEAD WITHOUT CONTRAST TECHNIQUE: Contiguous axial images were obtained from the base of the skull through the vertex without intravenous contrast. RADIATION DOSE REDUCTION: This exam was performed according to the departmental dose-optimization program which includes automated exposure control, adjustment of the mA and/or kV according to patient size and/or use of iterative reconstruction technique. COMPARISON:  None Available. FINDINGS: Brain: There is no mass, hemorrhage or extra-axial collection. The size and configuration of the ventricles and extra-axial CSF spaces are normal. The brain parenchyma is normal, without evidence of acute or chronic infarction. Vascular: No abnormal hyperdensity of the major intracranial arteries or dural venous sinuses. No intracranial atherosclerosis. Skull: The visualized skull base, calvarium and extracranial soft tissues are normal. Sinuses/Orbits: Maxillary sinus mucosal thickening. The orbits are normal. ASPECTS [facility] Stroke Program Early CT Score) - Ganglionic level infarction (caudate, lentiform nuclei, internal capsule, insula, M1-M3 cortex): 7 - Supraganglionic infarction (M4-M6 cortex): 3 Total  score (0-10 with 10 being normal): 10 IMPRESSION: 1. No acute intracranial abnormality. 2. ASPECTS is 10. These results were called by telephone at the time of interpretation on 06/19/2022 at 8:39 pm to provider Gloris Manchester , who verbally acknowledged these results. Electronically Signed   By: Deatra Robinson M.D.   On: 06/19/2022 20:39    Procedures Procedures    Medications Ordered in ED Medications  aspirin tablet 325 mg (has no administration in time range)  iohexol (OMNIPAQUE) 350 MG/ML injection 100 mL (100 mLs Intravenous Contrast Given 06/19/22 2106)    ED Course/ Medical Decision Making/ A&P                             Medical Decision Making Amount and/or Complexity of Data Reviewed Labs:  ordered. Radiology: ordered.   This patient presents to the ED for concern of strokelike symptoms, this involves an extensive number of treatment options, and is a complaint that carries with it a high risk of complications and morbidity.  The differential diagnosis includes CVA, TIA, complex migraine, substance abuse, hypoglycemia   Co morbidities that complicate the patient evaluation  HTN, anemia, arthritis, asthma, GERD, substance use   Additional history obtained:  Additional history obtained from patient's mother External records from outside source obtained and reviewed including EMR   Lab Tests:  I Ordered, and personally interpreted labs.  The pertinent results include: Mild hyperglycemia with mild non-anion gap metabolic acidosis; slight leukocytosis; lab work otherwise unremarkable   Imaging Studies ordered:  I ordered imaging studies including CT head, CTA head and neck I independently visualized and interpreted imaging which showed no acute findings I agree with the radiologist interpretation   Cardiac Monitoring: / EKG:  The patient was maintained on a cardiac monitor.  I personally viewed and interpreted the cardiac monitored which showed an underlying rhythm of:  Sinus rhythm   Consultations Obtained:  I requested consultation with the neurologist, Dr. Manson Passey,  and discussed lab and imaging findings as well as pertinent plan - they recommend: Admission for stroke workup and ASA   Problem List / ED Course / Critical interventions / Medication management  Patient presents for left-sided numbness.  He also endorses left-sided facial weakness.  Onset of the symptoms was 6:30 PM.  He has no history of CVA.  He does have risk factors of HTN and DM.  On arrival in the ED, vital signs are notable for tachycardia.  On exam, I do not appreciate any focal weakness.  He does have subjective diminished sensation to left V1-V3 as well as left upper and lower extremities.  Given symptoms and timeline, stroke stroke was initiated.  Patient underwent noncontrasted CT scan of head which did not show any acute findings.  I discussed with neurologist on-call, Dr. Manson Passey who recommends CTA head and neck with perfusion study.  If negative, patient to be admitted for observation overnight, MRI in the morning, and neurology consult tomorrow.  Patient's lab work is notable for mild hyperglycemia and leukocytosis.  CTA head and neck with perfusion study was negative for acute findings.  Patient was admitted to medicine for further management. I ordered medication including ASA for concern of CVA Reevaluation of the patient after these medicines showed that the patient stayed the same I have reviewed the patients home medicines and have made adjustments as needed   Social Determinants of Health:  History of substance abuse, current abuse of OTC cough medicine         Final Clinical Impression(s) / ED Diagnoses Final diagnoses:  Left sided numbness    Rx / DC Orders ED Discharge Orders     None         Gloris Manchester, MD 06/19/22 2151

## 2022-06-20 ENCOUNTER — Observation Stay (HOSPITAL_COMMUNITY): Payer: Medicaid Other

## 2022-06-20 DIAGNOSIS — R200 Anesthesia of skin: Secondary | ICD-10-CM | POA: Diagnosis not present

## 2022-06-20 LAB — CBC
HCT: 43.2 % (ref 39.0–52.0)
Hemoglobin: 13.9 g/dL (ref 13.0–17.0)
MCH: 25.1 pg — ABNORMAL LOW (ref 26.0–34.0)
MCHC: 32.2 g/dL (ref 30.0–36.0)
MCV: 78.1 fL — ABNORMAL LOW (ref 80.0–100.0)
Platelets: 229 10*3/uL (ref 150–400)
RBC: 5.53 MIL/uL (ref 4.22–5.81)
RDW: 17.1 % — ABNORMAL HIGH (ref 11.5–15.5)
WBC: 9.9 10*3/uL (ref 4.0–10.5)
nRBC: 0 % (ref 0.0–0.2)

## 2022-06-20 LAB — COMPREHENSIVE METABOLIC PANEL
ALT: 25 U/L (ref 0–44)
AST: 24 U/L (ref 15–41)
Albumin: 4 g/dL (ref 3.5–5.0)
Alkaline Phosphatase: 86 U/L (ref 38–126)
Anion gap: 10 (ref 5–15)
BUN: 8 mg/dL (ref 6–20)
CO2: 24 mmol/L (ref 22–32)
Calcium: 9.2 mg/dL (ref 8.9–10.3)
Chloride: 100 mmol/L (ref 98–111)
Creatinine, Ser: 0.85 mg/dL (ref 0.61–1.24)
GFR, Estimated: >60 mL/min (ref >60)
Glucose, Bld: 142 mg/dL — ABNORMAL HIGH (ref 70–99)
Potassium: 4 mmol/L (ref 3.5–5.1)
Sodium: 134 mmol/L — ABNORMAL LOW (ref 135–145)
Total Bilirubin: 0.7 mg/dL (ref 0.3–1.2)
Total Protein: 7.1 g/dL (ref 6.5–8.1)

## 2022-06-20 LAB — GLUCOSE, CAPILLARY: Glucose-Capillary: 194 mg/dL — ABNORMAL HIGH (ref 70–99)

## 2022-06-20 LAB — HIV ANTIBODY (ROUTINE TESTING W REFLEX): HIV Screen 4th Generation wRfx: NONREACTIVE

## 2022-06-20 MED ORDER — ONDANSETRON HCL 4 MG PO TABS: 4.0000 mg | ORAL_TABLET | Freq: Four times a day (QID) | ORAL | Status: DC | PRN

## 2022-06-20 MED ORDER — ONDANSETRON HCL 4 MG/2ML IJ SOLN: 4.0000 mg | Freq: Four times a day (QID) | INTRAMUSCULAR | Status: DC | PRN

## 2022-06-20 MED ORDER — SODIUM CHLORIDE 0.9% FLUSH
3.0000 mL | Freq: Two times a day (BID) | INTRAVENOUS | Status: DC
  Administered 2022-06-20 (×2): 3 mL via INTRAVENOUS

## 2022-06-20 MED ORDER — ACETAMINOPHEN 325 MG PO TABS
650.0000 mg | ORAL_TABLET | Freq: Four times a day (QID) | ORAL | Status: DC | PRN
  Administered 2022-06-20: 650 mg via ORAL
  Filled 2022-06-20: qty 2

## 2022-06-20 MED ORDER — VITAMIN D3 50 MCG (2000 UT) PO TABS: 1.0000 | ORAL_TABLET | Freq: Every day | ORAL | 2 refills | Status: DC

## 2022-06-20 MED ORDER — ENOXAPARIN SODIUM 40 MG/0.4ML IJ SOSY
40.0000 mg | PREFILLED_SYRINGE | INTRAMUSCULAR | Status: DC
  Administered 2022-06-20: 40 mg via SUBCUTANEOUS
  Filled 2022-06-20: qty 0.4

## 2022-06-20 MED ORDER — SODIUM CHLORIDE 0.9% FLUSH: 3.0000 mL | INTRAVENOUS | Status: DC | PRN

## 2022-06-20 MED ORDER — ACETAMINOPHEN 325 MG PO TABS: 650.0000 mg | ORAL_TABLET | Freq: Four times a day (QID) | ORAL | 0 refills | Status: DC | PRN

## 2022-06-20 MED ORDER — SERTRALINE HCL 100 MG PO TABS: 200.0000 mg | ORAL_TABLET | Freq: Every day | ORAL | 2 refills | Status: DC

## 2022-06-20 MED ORDER — RISPERIDONE 4 MG PO TABS
4.0000 mg | ORAL_TABLET | Freq: Every day | ORAL | 3 refills | Status: DC
Start: 2022-06-20 — End: 2023-03-26

## 2022-06-20 MED ORDER — SODIUM CHLORIDE 0.9 % IV SOLN: 250.0000 mL | INTRAVENOUS | Status: DC | PRN

## 2022-06-20 MED ORDER — ACETAMINOPHEN 650 MG RE SUPP: 650.0000 mg | Freq: Four times a day (QID) | RECTAL | Status: DC | PRN

## 2022-06-20 MED ORDER — AZITHROMYCIN 250 MG PO TABS
250.0000 mg | ORAL_TABLET | Freq: Every day | ORAL | Status: DC
  Administered 2022-06-20: 250 mg via ORAL
  Filled 2022-06-20: qty 1

## 2022-06-20 NOTE — Inpatient Diabetes Management (Signed)
Inpatient Diabetes Program Recommendations  AACE/ADA: New Consensus Statement on Inpatient Glycemic Control (2015)  Target Ranges:  Prepandial:   less than 140 mg/dL      Peak postprandial:   less than 180 mg/dL (1-2 hours)      Critically ill patients:  140 - 180 mg/dL   Lab Results  Component Value Date   GLUCAP 252 (H) 06/19/2022   HGBA1C 6.2 (H) 05/31/2019    Review of Glycemic Control  Diabetes history: DM2 Outpatient Diabetes medications: Metaglip 5-500 mg qd Current orders for Inpatient glycemic control: None  Inpatient Diabetes Program Recommendations:   Please consider: -Glycemic control order set with 0-9 units tid, 0-5 units hs  Thank you, Darel Hong E. Riva Sesma, RN, MSN, CDE  Diabetes Coordinator Inpatient Glycemic Control Team Team Pager [phone] (8am-5pm) 06/20/2022 12:12 PM

## 2022-06-20 NOTE — Progress Notes (Signed)
Patient discharged home today, transported home by family. Discharge paperwork went over with patient, patient verbalized understanding. Belongings sent home with patient.  ?

## 2022-06-20 NOTE — TOC Progression Note (Signed)
  Transition of Care [facility]) Screening Note   Patient Details  Name: Herbert Walsh Date of Birth: 1990-07-28   Transition of Care [facility]) CM/SW Contact:    Elliot Gault, LCSW Phone Number: 06/20/2022, 10:26 AM    Resource information added to AVS for SDOH needs.  Transition of Care Department [facility]) has reviewed patient and no TOC needs have been identified at this time. We will continue to monitor patient advancement through interdisciplinary progression rounds. If new patient transition needs arise, please place a TOC consult.

## 2022-06-20 NOTE — Discharge Instructions (Addendum)
1)Complete Abstinence from Illicit drugs and excessive amount of cough medicines strongly advised-- 2) wean off heavily caffeinated drinks over time discussed 3) outpatient or inpatient drug rehab programs including [facility]   Food Resources:  Agency Name: [facility] Address: [address] [place], [place] [address] Phone: [phone] Website: www.adtsrc.org Service(s) Offered: Meals on [employer] and Meals with Friends.  Home care, at home assisted living, volunteer services, Center  for Active Retirement, transportation Agency Name: [facility] Address: Sites vary. Much call first. Food Pantry locations: [address] [place], [place] [address] [employer] Phone: [phone] Website: none Service(s) Offered: Museum/gallery curator, utility assistance if funds available Careers adviser for all of [place], [employer], [employer], Office manager and [employer] for [employer] area only) Walk-in  current Id and current address verification required. WedThurs: 9:30-12:00 Agency Name: [facility] Address: [address] [place], [place] [address] Phone: [phone] or [phone] Website: none Service(s) Offered: Food assistance. Agency Name: [facility] Address: [address] [place], [place] [address] Phone: [phone] Website: BirthdayFever.at Service(s) Offered: Food assistance Monday Nights 5:00PM-5:30PM Agency Name: [employer] Address: [address], [place], [place] [address] Phone: [phone] or [phone] Website: none Service(s) Offered: Serves 1 hot meal a day at 11:00 am Monday-Sunday and 5 pm  on the second and fourth Sunday of each month Agency Name: [facility] Address: [address], [place], [place] [address] Phone: [phone] Website: [email or web address].[email or web address] Service(s) Offered: Customer service manager, WIC program. Agency Name: Holiday representative of [facility] Address: [address] [place], [place] / [address] [place], [place]  June 05, 2022 7 Phone: [phone] [place] / [phone] [place] Website: OpinionTrades.tn NetworkAffair.co.za  Service(s) Offered: Civil Service fast streamer, food pantry, soup kitchen ([place]) emergency financial  assistance, thrift stores, showers & hygiene products ([place]),  Christmas Assistance, spiritual help. Agency Name: [facility] Address: [address] [place], [place] [address] Phone: [phone] Website: www.[place].[email or web address] Service(s) Offered: Food pantry Tuesday, Wednesday and Thursday 9am-11:30am

## 2022-06-20 NOTE — Discharge Summary (Signed)
Herbert Walsh, is a 32 y.o. male  DOB 08-Jan-1991  MRN [id number].  Admission date:  06/19/2022  Admitting Physician  Meredeth Ide, MD  Discharge Date:  06/20/2022   Primary MD  Ignatius Specking, MD  Recommendations for primary care physician for things to follow:  1)Complete Abstinence from Illicit drugs and excessive amount of cough medicines strongly advised-- 2) wean off heavily caffeinated drinks over time discussed 3) outpatient or inpatient drug rehab programs including [facility]  Admission Diagnosis  Facial numbness [R20.0] Left sided numbness [R20.0]   Discharge Diagnosis  Facial numbness [R20.0] Left sided numbness [R20.0]    Principal Problem:   Facial numbness      Past Medical History:  Diagnosis Date   Anemia    Arthritis    rheumatoid   Asthma    Depression    GERD (gastroesophageal reflux disease)    History of COVID-19 03/29/2020   Hypertension 2019   Ischemic colitis (HCC) 09/2008   MSSA (methicillin susceptible Staphylococcus aureus)    sternaoclavicular absecess drainage   Osteoarthritis    sterum and right shoulder   PTSD (post-traumatic stress disorder)    Substance use disorder    Tachycardia     Past Surgical History:  Procedure Laterality Date   APPENDECTOMY     APPLICATION OF WOUND VAC Right 06/20/2019   Procedure: APPLICATION OF WOUND VAC;  Surgeon: [name], MD;  Location: [facility] OR;  Service: Vascular;  Laterality: Right;   APPLICATION OF WOUND VAC Right 06/23/2019   Procedure: APPLICATION OF WOUND VAC;  Surgeon: [name], MD;  Location: [facility] OR;  Service: Thoracic;  Laterality: Right;   COLONOSCOPY     x 2   I & D EXTREMITY Right 05/31/2019   Procedure: IRRIGATION AND DEBRIDEMENT LOWER EXTREMITY;  Surgeon: Tarry Kos, MD;  Location: [facility] OR;  Service: Orthopedics;  Laterality: Right;   I & D EXTREMITY Right 06/20/2019   Procedure: DEBRIDEMENT OF  STERNOCLAVICULAR JOINT ABSCESS;  Surgeon: [name], MD;  Location: [facility] OR;  Service: Vascular;  Laterality: Right;   INCISION AND DRAINAGE OF WOUND Right 04/12/2020   Procedure: excision of right sternoclavicular joint infection;  Surgeon: Peggye Form, DO;  Location: [facility];  Service: Plastics;  Laterality: Right;  45 min total   STERNAL WOUND DEBRIDEMENT Right 09/19/2019   Procedure: EXCISIONAL DEBRIDEMENT AND IRRIGATION OF RIGHT STERNOCLAVICULAR JOINT WOUND;  Surgeon: [name], MD;  Location: [facility] OR;  Service: Thoracic;  Laterality: Right;   TEE WITHOUT CARDIOVERSION N/A 06/05/2019   Procedure: TRANSESOPHAGEAL ECHOCARDIOGRAM (TEE);  Surgeon: Lewayne Bunting, MD;  Location: [facility] ENDOSCOPY;  Service: Cardiovascular;  Laterality: N/A;   WOUND EXPLORATION Right 06/23/2019   Procedure: irrigation and clean out right shoulder;  Surgeon: [name], MD;  Location: [facility] OR;  Service: Thoracic;  Laterality: Right;       HPI  from the history and physical done on the day of admission:   Herbert Walsh  is a 32 y.o. male, with history of rheumatoid arthritis, asthma, substance abuse, hypertension presented with left-sided facial numbness.  Patient also states that he has developed weakness of left side of the face.  Patient states that he takes dextromethorphan 30 mg tablets, every day.  He took 32 tablets in the past 24 hours.  Patient says that since that time he has developed numbness of left side of the face. Denies focal weakness of upper or lower extremities.  Denies blurred vision.  Denies numbness of extremities. In the ED code stroke was called, teleneurology evaluated the patient.  CT of the head was unremarkable, CTA head and neck did not show large vessel occlusion. Patient denies chest pain or shortness of breath Had injury to left maxillary sinus, CT head showed opacification of left maxillary sinus. Denies nausea vomiting or diarrhea Complains of left  upper quadrant pain which has resolved.       Hospital Course:    Assessment and Plan: 1)Left facial numbness--- stroke workup including CT head, MRI brain and CTA head and neck without significant abnormalities -Suspect patient sensory deficits were related to illicit drug use rather than acute stroke -Neurology input appreciated  2)Left maxillary sinus disease-CT head shows opacification of left maxillary sinus,  -patient received Zithromax and Flonase for sinusitis. -No sinus area tenderness on exam -OTC decongestants/mucolytic's and Flonase advised  3)Substance abuse-patient states that he usually takes dextromethorphan tablets daily.  He took 32 tablets in last 24 hours.  TOC consult for substance abuse counseling -Patient is not interested in drug rehab -He has been incarcerated previously for stealing OTC and that uses an order medications that he abuses from local pharmacies and stores . 4)Rheumatoid arthritis-patient says that he takes Humira as outpatient. -Follow-up with rheumatologist  5)Hypertension-resume clonidine and lisinopril  6)Diabetes mellitus type 2-no recent A1c, okay to hold glipizide/metformin combo for couple of days due to CTA study -Resume after that  Discharge Condition: Stable, neurosymptoms resolved  Follow UP   Follow-up Information     Vyas, Dhruv B, MD. Schedule an appointment as soon as possible for a visit in 1 week(s).   Specialty: Internal Medicine Contact information: [address] [place] [address] [phone]                  Consults obtained -neurology  Diet and Activity recommendation:  As advised  Discharge Instructions    Discharge Instructions     Call MD for:  difficulty breathing, headache or visual disturbances   Complete by: As directed    Call MD for:  persistant dizziness or light-headedness   Complete by: As directed    Call MD for:  persistant nausea and vomiting   Complete by: As directed    Call  MD for:  severe uncontrolled pain   Complete by: As directed    Call MD for:  temperature >100.4   Complete by: As directed    Diet - low sodium heart healthy   Complete by: As directed    Discharge instructions   Complete by: As directed    1)Complete Abstinence from Illicit drugs and excessive amount of cough medicines strongly advised-- 2) wean off heavily caffeinated drinks over time discussed 3) outpatient or inpatient drug rehab programs including [facility]   Increase activity slowly   Complete by: As directed        Discharge Medications     Allergies as of 06/20/2022   No Known Allergies  Medication List     STOP taking these medications    metFORMIN 500 MG tablet Commonly known as: GLUCOPHAGE   metroNIDAZOLE 500 MG tablet Commonly known as: FLAGYL   naproxen 500 MG tablet Commonly known as: NAPROSYN   zinc gluconate 50 MG tablet       TAKE these medications    acetaminophen 325 MG tablet Commonly known as: TYLENOL Take 2 tablets (650 mg total) by mouth every 6 (six) hours as needed for mild pain (or Fever >/= 101).   allopurinol 300 MG tablet Commonly known as: ZYLOPRIM Take 300 mg by mouth daily.   atorvastatin 10 MG tablet Commonly known as: LIPITOR Take 10 mg by mouth at bedtime.   cloNIDine 0.2 MG tablet Commonly known as: CATAPRES Take 1 tablet (0.2 mg total) by mouth 3 (three) times daily.   folic acid 1 MG tablet Commonly known as: FOLVITE Take 1 mg by mouth daily.   glipiZIDE-metformin 5-500 MG tablet Commonly known as: METAGLIP Take 1 tablet by mouth daily.   Humira (2 Syringe) 40 MG/0.4ML Pskt Generic drug: Adalimumab Inject 40 mg into the skin once a week.   lisinopril 10 MG tablet Commonly known as: ZESTRIL Take 10 mg by mouth daily.   lithium 600 MG capsule Take 1 capsule (600 mg total) by mouth at bedtime.   methotrexate 2.5 MG tablet Commonly known as: RHEUMATREX Take 6 tablets by mouth once a week.    omeprazole 40 MG capsule Commonly known as: PRILOSEC Take 1 capsule by mouth every morning.   risperidone 4 MG tablet Commonly known as: RISPERDAL Take 1 tablet (4 mg total) by mouth at bedtime.   sertraline 100 MG tablet Commonly known as: ZOLOFT Take 2 tablets (200 mg total) by mouth daily.   Vitamin D3 50 MCG (2000 UT) Tabs Take 1 tablet (50 mcg total) by mouth daily. What changed: how much to take        Major procedures and Radiology Reports - PLEASE review detailed and final reports for all details, in brief -  MR BRAIN WO CONTRAST  Result Date: 06/20/2022 CLINICAL DATA:  Neuro deficit, acute, stroke suspected. EXAM: MRI HEAD WITHOUT CONTRAST TECHNIQUE: Multiplanar, multiecho pulse sequences of the brain and surrounding structures were obtained without intravenous contrast. COMPARISON:  Head CT and CTA head/neck 06/19/2022. FINDINGS: Brain: No acute infarct or hemorrhage. No mass or midline shift. No hydrocephalus or extra-axial collection. No abnormal susceptibility. Vascular: Normal flow voids. Skull and upper cervical spine: Normal marrow signal. Sinuses/Orbits: Moderate mucosal disease in the bilateral maxillary sinuses. Orbits are unremarkable. Other: None. IMPRESSION: Normal brain MRI. Electronically Signed   By: Orvan Falconer M.D.   On: 06/20/2022 09:31   CT ANGIO HEAD NECK W WO CM W PERF (CODE STROKE)  Result Date: 06/19/2022 CLINICAL DATA:  Left-sided numbness EXAM: CT ANGIOGRAPHY HEAD AND NECK CT PERFUSION BRAIN TECHNIQUE: Multidetector CT imaging of the head and neck was performed using the standard protocol during bolus administration of intravenous contrast. Multiplanar CT image reconstructions and MIPs were obtained to evaluate the vascular anatomy. Carotid stenosis measurements (when applicable) are obtained utilizing NASCET criteria, using the distal internal carotid diameter as the denominator. Multiphase CT imaging of the brain was performed following IV bolus  contrast injection. Subsequent parametric perfusion maps were calculated using RAPID software. RADIATION DOSE REDUCTION: This exam was performed according to the departmental dose-optimization program which includes automated exposure control, adjustment of the mA and/or kV according to patient size and/or use  of iterative reconstruction technique. CONTRAST:  OMNIPAQUE IOHEXOL 350 MG/ML SOLN COMPARISON:  None Available. FINDINGS: Foot CTA NECK FINDINGS SKELETON: There is no bony spinal canal stenosis. No lytic or blastic lesion. OTHER NECK: Normal pharynx, larynx and major salivary glands. No cervical lymphadenopathy. Unremarkable thyroid gland. UPPER CHEST: No pneumothorax or pleural effusion. No nodules or masses. AORTIC ARCH: There is no calcific atherosclerosis of the aortic arch. There is no aneurysm, dissection or hemodynamically significant stenosis of the visualized portion of the aorta. Conventional 3 vessel aortic branching pattern. The visualized proximal subclavian arteries are widely patent. RIGHT CAROTID SYSTEM: Normal without aneurysm, dissection or stenosis. LEFT CAROTID SYSTEM: Normal without aneurysm, dissection or stenosis. VERTEBRAL ARTERIES: Left dominant configuration. Both origins are clearly patent. There is no dissection, occlusion or flow-limiting stenosis to the skull base (V1-V3 segments). CTA HEAD FINDINGS POSTERIOR CIRCULATION: --Vertebral arteries: Normal V4 segments. --Inferior cerebellar arteries: Normal. --Basilar artery: Normal. --Superior cerebellar arteries: Normal. --Posterior cerebral arteries (PCA): Normal. ANTERIOR CIRCULATION: --Intracranial internal carotid arteries: Normal. --Anterior cerebral arteries (ACA): Normal. Both A1 segments are present. Patent anterior communicating artery (a-comm). --Middle cerebral arteries (MCA): Normal. VENOUS SINUSES: As permitted by contrast timing, patent. ANATOMIC VARIANTS: None Review of the MIP images confirms the above findings.  CT Brain Perfusion Findings: ASPECTS: 10 CBF (<30%) Volume: 0mL Perfusion (Tmax>6.0s) volume: 0mL Mismatch Volume: 0mL Infarction Location:None IMPRESSION: 1. No emergent large vessel occlusion or high-grade stenosis of the intracranial arteries. 2. Normal CT perfusion. Electronically Signed   By: Deatra Robinson M.D.   On: 06/19/2022 21:27   CT HEAD CODE STROKE WO CONTRAST  Result Date: 06/19/2022 CLINICAL DATA:  Code stroke.  Left-sided weakness EXAM: CT HEAD WITHOUT CONTRAST TECHNIQUE: Contiguous axial images were obtained from the base of the skull through the vertex without intravenous contrast. RADIATION DOSE REDUCTION: This exam was performed according to the departmental dose-optimization program which includes automated exposure control, adjustment of the mA and/or kV according to patient size and/or use of iterative reconstruction technique. COMPARISON:  None Available. FINDINGS: Brain: There is no mass, hemorrhage or extra-axial collection. The size and configuration of the ventricles and extra-axial CSF spaces are normal. The brain parenchyma is normal, without evidence of acute or chronic infarction. Vascular: No abnormal hyperdensity of the major intracranial arteries or dural venous sinuses. No intracranial atherosclerosis. Skull: The visualized skull base, calvarium and extracranial soft tissues are normal. Sinuses/Orbits: Maxillary sinus mucosal thickening. The orbits are normal. ASPECTS [facility] Stroke Program Early CT Score) - Ganglionic level infarction (caudate, lentiform nuclei, internal capsule, insula, M1-M3 cortex): 7 - Supraganglionic infarction (M4-M6 cortex): 3 Total score (0-10 with 10 being normal): 10 IMPRESSION: 1. No acute intracranial abnormality. 2. ASPECTS is 10. These results were called by telephone at the time of interpretation on 06/19/2022 at 8:39 pm to provider Gloris Manchester , who verbally acknowledged these results. Electronically Signed   By: Deatra Robinson M.D.   On:  06/19/2022 20:39    Today   Subjective    Herbert Walsh today has no new complaints      Mother at bedside, questions answered  -Neurosymptoms resolved   Patient has been seen and examined prior to discharge   Objective   Blood pressure 115/88, pulse 95, temperature 97.8 F (36.6 C), temperature source Oral, resp. rate 18, height 5\' 11"  (1.803 m), weight 120.4 kg, SpO2 97 %.  No intake or output data in the 24 hours ending 06/20/22 1515  Exam Gen:- Awake Alert, no acute distress,  obese HEENT:- [place].AT, No sclera icterus, no significant tenderness over sinuses, no significant nasal drainage at this time Neck-Supple Neck,No JVD,.  Lungs-  CTAB , good air movement bilaterally CV- S1, S2 normal, regular Abd-  +ve B.Sounds, Abd Soft, No tenderness, increased truncal adiposity Extremity/Skin:- No  edema,   good pulses Psych-affect is appropriate, oriented x3 Neuro-reassuring neuroexam, no new focal deficits, no tremors    Data Review   CBC w Diff:  Lab Results  Component Value Date   WBC 9.9 06/20/2022   HGB 13.9 06/20/2022   HCT 43.2 06/20/2022   PLT 229 06/20/2022   LYMPHOPCT 11 06/19/2022   MONOPCT 5 06/19/2022   EOSPCT 2 06/19/2022   BASOPCT 0 06/19/2022    CMP:  Lab Results  Component Value Date   NA 134 (L) 06/20/2022   NA 135 05/11/2022   K 4.0 06/20/2022   CL 100 06/20/2022   CO2 24 06/20/2022   BUN 8 06/20/2022   BUN 4 (L) 05/11/2022   CREATININE 0.85 06/20/2022   CREATININE 0.98 12/16/2018   PROT 7.1 06/20/2022   ALBUMIN 4.0 06/20/2022   BILITOT 0.7 06/20/2022   ALKPHOS 86 06/20/2022   AST 24 06/20/2022   ALT 25 06/20/2022  .  Total Discharge time is about 33 minutes  Shon Hale M.D on 06/20/2022 at 3:15 PM  Go to [email or web address] -  for contact info  [facility] - Office  [phone]

## 2022-06-23 ENCOUNTER — Other Ambulatory Visit: Payer: Self-pay | Admitting: Psychiatry

## 2022-06-26 ENCOUNTER — Other Ambulatory Visit: Payer: Self-pay | Admitting: Psychiatry

## 2022-06-26 MED ORDER — LITHIUM CARBONATE 600 MG PO CAPS: 600.0000 mg | ORAL_CAPSULE | Freq: Every day | ORAL | 0 refills | Status: DC

## 2022-06-26 NOTE — Telephone Encounter (Signed)
received fax requesting a refill on the lithium. pt was last seen on 3-27 next appt 5-22 pt will not have enough to get to next appt.

## 2022-06-26 NOTE — Telephone Encounter (Signed)
Ordered
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
## 2022-07-05 ENCOUNTER — Encounter: Payer: Self-pay | Admitting: Psychiatry

## 2022-07-05 ENCOUNTER — Telehealth (INDEPENDENT_AMBULATORY_CARE_PROVIDER_SITE_OTHER): Payer: No Typology Code available for payment source | Admitting: Psychiatry

## 2022-07-05 DIAGNOSIS — F4310 Post-traumatic stress disorder, unspecified: Secondary | ICD-10-CM

## 2022-07-05 DIAGNOSIS — F6381 Intermittent explosive disorder: Secondary | ICD-10-CM

## 2022-07-05 DIAGNOSIS — F1994 Other psychoactive substance use, unspecified with psychoactive substance-induced mood disorder: Secondary | ICD-10-CM | POA: Diagnosis not present

## 2022-07-05 DIAGNOSIS — F330 Major depressive disorder, recurrent, mild: Secondary | ICD-10-CM

## 2022-07-05 MED ORDER — LITHIUM CARBONATE 300 MG PO CAPS: 300.0000 mg | ORAL_CAPSULE | Freq: Every day | ORAL | 0 refills | Status: DC

## 2022-07-05 MED ORDER — CLONIDINE HCL 0.2 MG PO TABS
0.2000 mg | ORAL_TABLET | Freq: Three times a day (TID) | ORAL | 0 refills | Status: DC
Start: 2022-07-05 — End: 2022-09-06

## 2022-08-09 ENCOUNTER — Other Ambulatory Visit: Payer: Self-pay

## 2022-08-09 ENCOUNTER — Encounter: Payer: Self-pay | Admitting: Emergency Medicine

## 2022-08-09 ENCOUNTER — Ambulatory Visit
Admission: EM | Admit: 2022-08-09 | Discharge: 2022-08-09 | Disposition: A | Discharge status: Home / Self Care | Payer: Medicaid Other | Attending: Nurse Practitioner | Admitting: Nurse Practitioner

## 2022-08-09 DIAGNOSIS — G8929 Other chronic pain: Secondary | ICD-10-CM | POA: Diagnosis not present

## 2022-08-09 DIAGNOSIS — L739 Follicular disorder, unspecified: Secondary | ICD-10-CM

## 2022-08-09 DIAGNOSIS — R519 Headache, unspecified: Secondary | ICD-10-CM

## 2022-08-09 MED ORDER — KETOROLAC TROMETHAMINE 30 MG/ML IJ SOLN
15.0000 mg | Freq: Once | INTRAMUSCULAR | Status: AC
  Administered 2022-08-09: 15 mg via INTRAMUSCULAR

## 2022-08-09 MED ORDER — MUPIROCIN 2 % EX OINT: 1.0000 | TOPICAL_OINTMENT | Freq: Two times a day (BID) | CUTANEOUS | 0 refills | Status: DC

## 2022-08-09 NOTE — ED Provider Notes (Signed)
[facility]    CSN: [id number] Arrival date & time: 08/09/22  1151      History   Chief Complaint Chief Complaint  Patient presents with   Headache    HPI Herbert Walsh is a 32 y.o. male.   Patient presents today for right-sided headache since last night.  Reports the pain onset was gradual, is currently severe.  He states the pain is a 10 out of 10, however this is not the worst headache of his life.  Reports history of similar headaches.  He is concerned for temporal arteritis or blood clot in his head.  He has a lump on the right side of his head that he has been picking and scratching at reports he has not pain shooting down his face and his face is going numb intermittently.  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  He denies blurred vision, double vision, or other changes in his vision.  No floaters in the vision.  No dizziness or lightheadedness.  He endorses light and sound sensitivity that is baseline for his headaches.  Has tried naproxen and Motrin without improvement in headache, reports this normally does help.  No upper extremity or lower extremity weakness, recent fall, or head injury.  He denies trouble with speech, trouble with memory, change in behavior, or emotional changes since the headache began.  No difficulty swallowing or drooling.  Reports he has been seen for the same symptoms previously in the ER when they did a CAT scan of his head that was negative for stroke.  He states over and over "I know this is not a stroke."  He has a neurologist that he follows with, last visit was about 1 month ago.    Past Medical History:  Diagnosis Date   Anemia    Arthritis    rheumatoid   Asthma    Depression    GERD (gastroesophageal reflux disease)    History of COVID-19 03/29/2020   Hypertension 2019   Ischemic colitis (HCC) 09/2008   MSSA (methicillin susceptible  Staphylococcus aureus)    sternaoclavicular absecess drainage   Osteoarthritis    sterum and right shoulder   PTSD (post-traumatic stress disorder)    Substance use disorder    Tachycardia     Patient Active Problem List   Diagnosis Date Noted   Facial numbness 06/19/2022   Acute upper respiratory infection 10/06/2020   Adult-onset obesity 10/06/2020   Carbuncle and furuncle of buttock 10/06/2020   Contusion of knee 10/06/2020   Myopia 10/06/2020   Pharyngitis 10/06/2020   Screening examination for pulmonary tuberculosis 10/06/2020   Health examination of defined subpopulation 10/06/2020   Fitting and adjustment of spectacles and contact lenses 10/06/2020   COVID-19 01/22/2020   Nonhealing surgical wound 12/17/2019   Shoulder pain, right 11/19/2019   Encounter for surgical wound dressing change 09/29/2019   Encounter for postoperative wound check 09/24/2019   Change or removal of surgical wound dressing 09/03/2019   Draining cutaneous sinus tract 08/27/2019   Open wound of chest wall with complication, right, sequela 08/20/2019   Wound check, abscess 08/06/2019   Neuropathic pain of chest 07/23/2019   Wound with infection determined by examination 07/17/2019   Open wound of chest wall 07/09/2019   Abscess of parasternal chest wall 07/02/2019   Osteomyelitis (HCC) 06/20/2019   MSSA (methicillin susceptible Staphylococcus aureus) infection 06/20/2019  Sternoclavicular (joint) (ligament) sprain 06/18/2019   Sternal osteomyelitis (HCC) 06/18/2019   Septic arthritis of knee, right (HCC) 05/31/2019   Myofascitis 05/31/2019   Prediabetes 05/31/2019   MSSA bacteremia 05/31/2019   Septic arthritis of right sternoclavicular joint (HCC) 05/30/2019   Rheumatoid arthritis (HCC) 05/30/2019   Glucosuria 05/30/2019   Tobacco use 05/05/2019   Use of energy drinks 05/05/2019   H. pylori infection 04/04/2019   Hyperplastic polyp of transverse colon 04/04/2019   Hyperplastic rectal  polyp 04/04/2019   Intermittent explosive 03/19/2019   Difficulty controlling anger 03/19/2019   Colitis 03/11/2019   Gastroesophageal reflux disease 03/11/2019   NSAID long-term use 03/11/2019   Anemia 01/27/2019   Granulocytosis 01/27/2019   PTSD (post-traumatic stress disorder) 11/01/2017    Past Surgical History:  Procedure Laterality Date   APPENDECTOMY     APPLICATION OF WOUND VAC Right 06/20/2019   Procedure: APPLICATION OF WOUND VAC;  Surgeon: [name], MD;  Location: [facility] OR;  Service: Vascular;  Laterality: Right;   APPLICATION OF WOUND VAC Right 06/23/2019   Procedure: APPLICATION OF WOUND VAC;  Surgeon: [name], MD;  Location: [facility] OR;  Service: Thoracic;  Laterality: Right;   COLONOSCOPY     x 2   I & D EXTREMITY Right 05/31/2019   Procedure: IRRIGATION AND DEBRIDEMENT LOWER EXTREMITY;  Surgeon: Tarry Kos, MD;  Location: [facility] OR;  Service: Orthopedics;  Laterality: Right;   I & D EXTREMITY Right 06/20/2019   Procedure: DEBRIDEMENT OF STERNOCLAVICULAR JOINT ABSCESS;  Surgeon: [name], MD;  [facility];  Service: Vascular;  Laterality: Right;   INCISION AND DRAINAGE OF WOUND Right 04/12/2020   Procedure: excision of right sternoclavicular joint infection;  Surgeon: Peggye Form, DO;  Location: [facility];  Service: Plastics;  Laterality: Right;  45 min total   STERNAL WOUND DEBRIDEMENT Right 09/19/2019   Procedure: EXCISIONAL DEBRIDEMENT AND IRRIGATION OF RIGHT STERNOCLAVICULAR JOINT WOUND;  Surgeon: [name], MD;  Location: [facility] OR;  Service: Thoracic;  Laterality: Right;   TEE WITHOUT CARDIOVERSION N/A 06/05/2019   Procedure: TRANSESOPHAGEAL ECHOCARDIOGRAM (TEE);  Surgeon: Lewayne Bunting, MD;  Location: [facility] ENDOSCOPY;  Service: Cardiovascular;  Laterality: N/A;   WOUND EXPLORATION Right 06/23/2019   Procedure: irrigation and clean out right shoulder;  Surgeon: [name], MD;  Location[facility] OR;  Service: Thoracic;   Laterality: Right;       Home Medications    Prior to Admission medications   Medication Sig Start Date End Date Taking? Authorizing Provider  mupirocin ointment (BACTROBAN) 2 % Apply 1 Application topically 2 (two) times daily. 08/09/22  Yes Valentino Nose, NP  acetaminophen (TYLENOL) 325 MG tablet Take 2 tablets (650 mg total) by mouth every 6 (six) hours as needed for mild pain (or Fever >/= 101). 06/20/22   Shon Hale, MD  allopurinol (ZYLOPRIM) 300 MG tablet Take 300 mg by mouth daily. 10/31/21   [provider]  atorvastatin (LIPITOR) 10 MG tablet Take 10 mg by mouth at bedtime.  09/25/17   [provider]  Cholecalciferol (VITAMIN D3) 50 MCG (2000 UT) TABS Take 1 tablet (50 mcg total) by mouth daily. 06/20/22   Shon Hale, MD  cloNIDine (CATAPRES) 0.2 MG tablet Take 1 tablet (0.2 mg total) by mouth 3 (three) times daily. 07/05/22 10/03/22  Neysa Hotter, MD  folic acid (FOLVITE) 1 MG tablet Take 1 mg by mouth daily. 06/10/20   [provider]  glipiZIDE-metformin (METAGLIP) 5-500 MG tablet Take 1 tablet by mouth daily.    [provider]  HUMIRA 40 MG/0.4ML PSKT Inject 40 mg into the skin once a week.    [provider]  lisinopril (PRINIVIL,ZESTRIL) 10 MG tablet Take 10 mg by mouth daily.    [provider]  lithium 600 MG capsule Take 1 capsule (600 mg total) by mouth at bedtime. 06/26/22 09/24/22  Neysa Hotter, MD  lithium carbonate 300 MG capsule Take 1 capsule (300 mg total) by mouth at bedtime for 7 days. 07/05/22 07/12/22  Neysa Hotter, MD  methotrexate (RHEUMATREX) 2.5 MG tablet Take 6 tablets by mouth once a week. 02/07/22   [provider]  omeprazole (PRILOSEC) 40 MG capsule Take 1 capsule by mouth every morning. 05/25/20   [provider]  risperidone (RISPERDAL) 4 MG tablet Take 1 tablet (4 mg total) by mouth at bedtime. 06/20/22   Shon Hale, MD  sertraline (ZOLOFT) 100 MG tablet Take 2  tablets (200 mg total) by mouth daily. 06/20/22 06/20/23  Shon Hale, MD    Family History Family History  Problem Relation Age of Onset   Alcohol abuse Mother    Depression Mother    Drug abuse Mother    Physical abuse Mother    Sexual abuse Mother    ADD / ADHD Paternal Aunt    Alcohol abuse Maternal Grandfather    Bipolar disorder Maternal Grandmother    Schizophrenia Maternal Grandmother    Drug abuse Maternal Grandmother    Alcohol abuse Paternal Grandfather    Depression Paternal Grandfather    Depression Paternal Grandmother    Drug abuse Paternal Grandmother     Social History Social History   Tobacco Use   Smoking status: Every Day    Packs/day: 1.00    Years: 15.00    Additional pack years: 0.00    Total pack years: 15.00    Types: Cigarettes   Smokeless tobacco: Never  Vaping Use   Vaping Use: Never used  Substance Use Topics   Alcohol use: Not Currently    Comment: rare beer   Drug use: Not Currently    Types: Marijuana    Comment: cough med     Allergies   Patient has no known allergies.   Review of Systems Review of Systems Per HPI  Physical Exam Triage Vital Signs ED Triage Vitals  Enc Vitals Group     BP 08/09/22 1207 (!) 132/92     Pulse Rate 08/09/22 1207 (!) 104     Resp 08/09/22 1207 20     Temp 08/09/22 1207 97.9 F (36.6 C)     Temp Source 08/09/22 1207 Oral     SpO2 08/09/22 1207 96 %     Weight --      Height --      Head Circumference --      Peak Flow --      Pain Score 08/09/22 1206 10     Pain Loc --      Pain Edu? --      Excl. in GC? --    No data found.  Updated Vital Signs BP (!) 132/92 (BP Location: Right Arm)   Pulse (!) 104   Temp 97.9 F (36.6 C) (Oral)   Resp 20   SpO2 96%   Visual Acuity Right Eye Distance:   Left Eye Distance:   Bilateral Distance:    Right Eye Near:   Left Eye Near:  Bilateral Near:     Physical Exam Vitals and nursing note reviewed.  Constitutional:      General:  He is not in acute distress.    Appearance: He is well-developed. He is not toxic-appearing.  HENT:     Head: Normocephalic and atraumatic.      Comments: Patient points to area marked as area of pain.  There is a raised nodule that is consistent with folliculitis.  No surrounding erythema or warmth.    Mouth/Throat:     Mouth: Mucous membranes are moist.     Pharynx: Oropharynx is clear.  Eyes:     General: No scleral icterus.    Extraocular Movements: Extraocular movements intact.     Right eye: Normal extraocular motion and no nystagmus.     Left eye: Normal extraocular motion and no nystagmus.     Pupils: Pupils are equal, round, and reactive to light. Pupils are equal.  Cardiovascular:     Rate and Rhythm: Normal rate and regular rhythm.  Pulmonary:     Effort: Pulmonary effort is normal. No respiratory distress.  Musculoskeletal:        General: No swelling or tenderness. Normal range of motion.     Cervical back: Normal range of motion. No rigidity.  Lymphadenopathy:     Cervical: No cervical adenopathy.  Skin:    General: Skin is warm and dry.     Coloration: Skin is not cyanotic or pale.     Findings: No rash.  Neurological:     Mental Status: He is alert and oriented to person, place, and time.     Cranial Nerves: No cranial nerve deficit.     Motor: No weakness.     Coordination: Coordination normal.  Psychiatric:        Attention and Perception: He is inattentive.        Speech: Speech is rapid and pressured and tangential.        Behavior: Behavior is agitated.      UC Treatments / Results  Labs (all labs ordered are listed, but only abnormal results are displayed) Labs Reviewed - No data to display  EKG   Radiology No results found.  Procedures Procedures (including critical care time)  Medications Ordered in UC Medications  ketorolac (TORADOL) 30 MG/ML injection 15 mg (15 mg Intramuscular Given 08/09/22 1235)    Initial Impression / Assessment  and Plan / UC Course  I have reviewed the triage vital signs and the nursing notes.  Pertinent labs & imaging results that were available during my care of the patient were reviewed by me and considered in my medical decision making (see chart for details).   Patient is well-appearing, afebrile, not tachypneic, oxygenating well on room air.  Patient is mildly hypertensive and tachycardic in triage today.  1. Chronic intractable headache, unspecified headache type Will treat headache with Toradol 15 mg IM today in urgent care Neurologic exam is unremarkable I discussed with patient that I very low suspicion for temporal arteritis or a blood clot in his head, however if he remains concerned about this, he needs to be seen in the emergency room the patient verbalized understanding If Toradol does not improve headache, recommend follow-up with PCP  2. Folliculitis Treat with mupirocin ointment 2 times daily, stop picking at the area Seek care for persistent/worsening symptoms despite treatment  The patient was given the opportunity to ask questions.  All questions answered to their satisfaction.  The patient is in agreement  to this plan.    Final Clinical Impressions(s) / UC Diagnoses   Final diagnoses:  Chronic intractable headache, unspecified headache type  Folliculitis     Discharge Instructions      We have given you a shot of Toradol today to help with the headache.  If this does not help, please follow-up with your primary care provider.  Stop picking at the bump in your scalp.  You can use the topical mupirocin if sent to the pharmacy to help with folliculitis.  I do not believe you have temporal arteritis or blood clot in your head, but if you are concerned about temporal arteritis or blood clot in your head, you need to be seen in the emergency room.    ED Prescriptions     Medication Sig Dispense Auth. Provider   mupirocin ointment (BACTROBAN) 2 % Apply 1 Application  topically 2 (two) times daily. 22 g Valentino Nose, NP      PDMP not reviewed this encounter.   Valentino Nose, NP 08/09/22 1428

## 2022-08-09 NOTE — Discharge Instructions (Addendum)
We have given you a shot of Toradol today to help with the headache.  If this does not help, please follow-up with your primary care provider.  Stop picking at the bump in your scalp.  You can use the topical mupirocin if sent to the pharmacy to help with folliculitis.  I do not believe you have temporal arteritis or blood clot in your head, but if you are concerned about temporal arteritis or blood clot in your head, you need to be seen in the emergency room.

## 2022-08-09 NOTE — ED Triage Notes (Signed)
Pt reports right sided headache since last night. Reports intermittent history of similar off and on for last several months. Pt reports "I have been seen for same in the past and its never a stroke but I think its temporal arteritis due the bulge on my right temple."   Pt reports light and sound sensitivity.

## 2022-08-14 ENCOUNTER — Telehealth: Payer: Self-pay | Admitting: Psychiatry

## 2022-08-14 DIAGNOSIS — F4310 Post-traumatic stress disorder, unspecified: Secondary | ICD-10-CM

## 2022-08-14 NOTE — Telephone Encounter (Signed)
Patient's mother called and requested a referral be sent to [facility] [employer] ([phone]). Patient was previously referred out to [facility] recovery services-Please Advice

## 2022-08-14 NOTE — Telephone Encounter (Signed)
I will route this message to the nurse for this referral. Please advise the patient that it will likely be next week, as the nurse is out this week. In the meantime, please advise them to contact their office directly to see if they can schedule the appointment.

## 2022-08-15 NOTE — Telephone Encounter (Signed)
Message is routed regarding the referral request.

## 2022-08-21 NOTE — Telephone Encounter (Signed)
referral was sent to [facility]

## 2022-09-06 ENCOUNTER — Other Ambulatory Visit: Payer: Self-pay | Admitting: Psychiatry

## 2022-09-06 ENCOUNTER — Telehealth: Payer: Self-pay | Admitting: Psychiatry

## 2022-09-06 DIAGNOSIS — F4310 Post-traumatic stress disorder, unspecified: Secondary | ICD-10-CM

## 2022-09-06 DIAGNOSIS — F6381 Intermittent explosive disorder: Secondary | ICD-10-CM

## 2022-09-06 MED ORDER — CLONIDINE HCL 0.2 MG PO TABS
0.2000 mg | ORAL_TABLET | Freq: Three times a day (TID) | ORAL | 0 refills | Status: AC
Start: 2022-10-03 — End: ?

## 2022-09-06 NOTE — Telephone Encounter (Signed)
even thou i have send 2x they still don;t have referral. so I emailed and faxed again. she is going to call me when she gets it

## 2022-09-06 NOTE — Telephone Encounter (Signed)
Referral was emailed and mailed.  Received fax confirmation that referral fax was received.  I tried to call the office to confirm and I keep getting voicemail I have left 2 voicemail message requesting a call back if they received or did not received the referral

## 2022-09-06 NOTE — Telephone Encounter (Signed)
Please send the referral information to them and contact them as needed. Additionally, update the patient regarding this. I have ordered another 90 days of clonidine as a bridge. It appears that other medications were ordered by another provider, which should last several months.

## 2022-09-06 NOTE — Telephone Encounter (Signed)
Patient called stating [facility] in [place] has not received referral information. He called first of July stating they have not received and now again.  Patient states it needs to be faxed to [phone] attn: Tiffany.  He is almost out of medication. Please contact patient to advise it has been sent

## 2022-09-24 ENCOUNTER — Emergency Department (HOSPITAL_COMMUNITY): Payer: MEDICAID

## 2022-09-24 ENCOUNTER — Emergency Department (HOSPITAL_COMMUNITY)
Admission: EM | Admit: 2022-09-24 | Discharge: 2022-09-25 | Disposition: A | Discharge status: Home / Self Care | Payer: MEDICAID | Attending: Emergency Medicine | Admitting: Emergency Medicine

## 2022-09-24 ENCOUNTER — Other Ambulatory Visit: Payer: Self-pay

## 2022-09-24 DIAGNOSIS — H538 Other visual disturbances: Secondary | ICD-10-CM | POA: Insufficient documentation

## 2022-09-24 DIAGNOSIS — M25512 Pain in left shoulder: Secondary | ICD-10-CM | POA: Insufficient documentation

## 2022-09-24 DIAGNOSIS — R110 Nausea: Secondary | ICD-10-CM | POA: Diagnosis not present

## 2022-09-24 DIAGNOSIS — R531 Weakness: Secondary | ICD-10-CM | POA: Diagnosis not present

## 2022-09-24 DIAGNOSIS — F1910 Other psychoactive substance abuse, uncomplicated: Secondary | ICD-10-CM

## 2022-09-24 DIAGNOSIS — I10 Essential (primary) hypertension: Secondary | ICD-10-CM | POA: Insufficient documentation

## 2022-09-24 DIAGNOSIS — Z79899 Other long term (current) drug therapy: Secondary | ICD-10-CM | POA: Insufficient documentation

## 2022-09-24 DIAGNOSIS — R200 Anesthesia of skin: Secondary | ICD-10-CM | POA: Diagnosis not present

## 2022-09-24 DIAGNOSIS — F419 Anxiety disorder, unspecified: Secondary | ICD-10-CM | POA: Insufficient documentation

## 2022-09-24 DIAGNOSIS — R519 Headache, unspecified: Secondary | ICD-10-CM | POA: Diagnosis present

## 2022-09-24 LAB — CBC WITH DIFFERENTIAL/PLATELET
Abs Immature Granulocytes: 0.04 10*3/uL (ref 0.00–0.07)
Basophils Absolute: 0 10*3/uL (ref 0.0–0.1)
Basophils Relative: 0 %
Eosinophils Absolute: 0.4 10*3/uL (ref 0.0–0.5)
Eosinophils Relative: 4 %
HCT: 42.3 % (ref 39.0–52.0)
Hemoglobin: 13.8 g/dL (ref 13.0–17.0)
Immature Granulocytes: 0 %
Lymphocytes Relative: 15 %
Lymphs Abs: 1.8 10*3/uL (ref 0.7–4.0)
MCH: 26.1 pg (ref 26.0–34.0)
MCHC: 32.6 g/dL (ref 30.0–36.0)
MCV: 80.1 fL (ref 80.0–100.0)
Monocytes Absolute: 0.5 10*3/uL (ref 0.1–1.0)
Monocytes Relative: 5 %
Neutro Abs: 8.8 10*3/uL — ABNORMAL HIGH (ref 1.7–7.7)
Neutrophils Relative %: 76 %
Platelets: 118 10*3/uL — ABNORMAL LOW (ref 150–400)
RBC: 5.28 MIL/uL (ref 4.22–5.81)
RDW: 16.9 % — ABNORMAL HIGH (ref 11.5–15.5)
WBC: 11.6 10*3/uL — ABNORMAL HIGH (ref 4.0–10.5)
nRBC: 0 % (ref 0.0–0.2)

## 2022-09-24 LAB — COMPREHENSIVE METABOLIC PANEL
ALT: 40 U/L (ref 0–44)
AST: 34 U/L (ref 15–41)
Albumin: 3.7 g/dL (ref 3.5–5.0)
Alkaline Phosphatase: 85 U/L (ref 38–126)
Anion gap: 12 (ref 5–15)
BUN: 6 mg/dL (ref 6–20)
CO2: 22 mmol/L (ref 22–32)
Calcium: 9.1 mg/dL (ref 8.9–10.3)
Chloride: 101 mmol/L (ref 98–111)
Creatinine, Ser: 0.76 mg/dL (ref 0.61–1.24)
GFR, Estimated: >60 mL/min (ref >60)
Glucose, Bld: 206 mg/dL — ABNORMAL HIGH (ref 70–99)
Potassium: 4 mmol/L (ref 3.5–5.1)
Sodium: 135 mmol/L (ref 135–145)
Total Bilirubin: 0.7 mg/dL (ref 0.3–1.2)
Total Protein: 6.4 g/dL — ABNORMAL LOW (ref 6.5–8.1)

## 2022-09-24 LAB — ACETAMINOPHEN LEVEL: Acetaminophen (Tylenol), Serum: <10 ug/mL — ABNORMAL LOW (ref 10–30)

## 2022-09-24 LAB — ETHANOL: Alcohol, Ethyl (B): <10 mg/dL (ref <10)

## 2022-09-24 LAB — SALICYLATE LEVEL: Salicylate Lvl: <7 mg/dL — ABNORMAL LOW (ref 7.0–30.0)

## 2022-09-24 MED ORDER — SODIUM CHLORIDE 0.9 % IV BOLUS
1000.0000 mL | Freq: Once | INTRAVENOUS | Status: AC
  Administered 2022-09-24: 1000 mL via INTRAVENOUS

## 2022-09-24 MED ORDER — ONDANSETRON HCL 4 MG/2ML IJ SOLN
4.0000 mg | Freq: Once | INTRAMUSCULAR | Status: AC
  Administered 2022-09-24: 4 mg via INTRAVENOUS
  Filled 2022-09-24: qty 2

## 2022-09-24 MED ORDER — DIAZEPAM 5 MG PO TABS
5.0000 mg | ORAL_TABLET | Freq: Once | ORAL | Status: AC
  Administered 2022-09-24: 5 mg via ORAL
  Filled 2022-09-24: qty 1

## 2022-09-24 NOTE — Discharge Instructions (Addendum)
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  Please see the phone number for the psychiatrist above, call in the morning for next fillable appointment  Substance Abuse Treatment Programs  Intensive Outpatient Programs [facility]     [address]      [place], [place]                   [phone]       [facility] [address] [place] [address] [place], [place] [phone]  [facility]     (Inpatient and outpatient)     [address]           [phone]    [facility] [phone] (Suboxone and Methadone)  [address]      [place], [place] [address]      [phone]       [address] [place], [place] [phone]  [facility] (Outpatient/Inpatient, [facility])    (insurance only) [phone]             [facility] (Groups & Residential) [place], [place] [phone]     [facility]     [address]     [place], [place]      [phone]       [facility] (for caregivers and family) [phone] [address] [place], [place] [phone]      Residential Treatment Programs [facility]      [address], [place], [place] [address]  [phone]       [facility] [address] [place], [place] [address] [phone]  [facility] [place]        [phone]       [facility] [phone]  [facility]             [address]                                         [place], [place]                                                [phone] or [phone]                               [facility] [place] [address] [place], [address] [phone]  [facility]    [address]      [place], [place]     [phone]       [facility] [address] [place], [place] [phone]  [facility]   [address] [place], [place] [address]     [phone]      Admissions: 8am-3pm M-F  [facility] ([facility]) [address] [place], [place] [phone]  [facility]: Residential Program [phone] Days)   [place], [place]      [phone] or [phone]     [facility]: [address] [facility] [place], [place] (Walk  in Hours over the weekend or by referral)  [facility] [address] [place], [place], [place] [address] [phone]  Crisis Mobile: [facility]:  [phone] (for crisis response 24 hours a day) [facility] Hotline:      [phone] Outpatient Psychiatry and Counseling  [facility]: Mobile Crisis Management 24 hours:  [phone]  [facility] [employer] sliding scale fee and walk in schedule: M-F 8am-12pm/1pm-3pm [address]  [place], [place] [address] [phone]  [facility] [address] [place], [place] [address] [phone]  [facility] [employer] new patient walk-in appointments available Monday - Friday 8am -3pm.          [address] [place], [place] [address] [phone] or crisis line- [phone]  [facility] Services/ Intensive Outpatient Therapy Program [address] [place], [place] [address] [phone]  [facility]                  Crisis Services      [phone] [address]     [place], [place] [address]                 [facility]   [facility] [phone]. [address] [place], [place] [address]   [employer] [facility]          [address]  [place], [place] [address]       [phone]  [facility] [address] [place], [place]  [address] [phone]  [facility]    [address]    [place], [place] [address]     [phone]       Andee Poles, MD     [address]     [place] [address]     [phone]       [facility] [address] [place] [address]  [facility]     [address] [place], [place]      [phone]       [facility] Eulogio Ditch, MD [address] [place], [place] [address] [phone]  [facility]     [address]     [place], [place] [address]     [phone]       [facility] [address] [place], [place] [address] [phone] Resources for Temporary Residential Assistance/Crisis Centers  [facility]) M-F 8am-3pm   [address] [place], [place] [address]   [phone] Services include: laundry, barbering, support groups, case management, phone  & computer access, showers, AA/NA mtgs, mental health/substance abuse nurse, job skills class, disability information, VA assistance, spiritual classes, etc.   HOMELESS SHELTERS  [facility]     [facility]   [address], [place]     [phone]              [employer] (women and children)       [address] [place], [place] [address] [phone] [email or web address] for application and process Application Required  [facility]   [address]    [place] [address]     [phone]  [employer] [place] [address] [place], [place] [address] [phone] application appt.) Application Required  [facility] (women only)    [address]     [place], [place] [address]     [phone]      Intake starts 6pm daily Need valid ID, SSC, & Police report [employer] [address] [place], [place] [phone] Application Required  [employer] (men only)     [address]      [place], [place]     [phone]       [facility] [place] (Pregnant women only) [address] [place], [place] [phone]  [facility]      [address]      [place], [place] [address]     [phone]             [facility] [address] [place], [place] [phone] 90 day commitment/SA/Application process  [facility](men only)     [address]     [place], [place]     [phone]       Check-in at [facility] [address] [place], [place] [address] [phone] Men/Women/Women and Children must be there by 7 pm  [facility] [place], [place] [phone]

## 2022-09-24 NOTE — ED Triage Notes (Signed)
Pt states he has taking 5-6 boxes of cough and cold medicine over the last 2 days, now c/o headache. 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

## 2022-09-24 NOTE — ED Provider Notes (Signed)
[facility] AT [facility] Provider Note   CSN: [id number] Arrival date & time: 09/24/22  2102     History  Chief Complaint  Patient presents with   Headache    Shinji Meeker III is a 32 y.o. male presenting with ingestion of [employer] Cough and Cold HBP. Pt states he took 3 boxes today and some Naproxen. Now he is anxiuos, nausea, and has a HA. HA started 3 hrs ago,pt reports pain is left sided and referred into left shoulder. Reports associated numbness and feeling of weakness on the left side of face and blurry vision. Denies SI/HI, denies thoughts of harming himself. Denies recent trauma or falls.    Headache      Home Medications Prior to Admission medications   Medication Sig Start Date End Date Taking? Authorizing Provider  acetaminophen (TYLENOL) 325 MG tablet Take 2 tablets (650 mg total) by mouth every 6 (six) hours as needed for mild pain (or Fever >/= 101). 06/20/22   Shon Hale, MD  allopurinol (ZYLOPRIM) 300 MG tablet Take 300 mg by mouth daily. 10/31/21   [provider]  atorvastatin (LIPITOR) 10 MG tablet Take 10 mg by mouth at bedtime.  09/25/17   [provider]  Cholecalciferol (VITAMIN D3) 50 MCG (2000 UT) TABS Take 1 tablet (50 mcg total) by mouth daily. 06/20/22   Shon Hale, MD  cloNIDine (CATAPRES) 0.2 MG tablet Take 1 tablet (0.2 mg total) by mouth 3 (three) times daily. 10/03/22 01/01/23  Neysa Hotter, MD  folic acid (FOLVITE) 1 MG tablet Take 1 mg by mouth daily. 06/10/20   [provider]  glipiZIDE-metformin (METAGLIP) 5-500 MG tablet Take 1 tablet by mouth daily.    [provider]  HUMIRA 40 MG/0.4ML PSKT Inject 40 mg into the skin once a week.    [provider]  lisinopril (PRINIVIL,ZESTRIL) 10 MG tablet Take 10 mg by mouth daily.    [provider]  lithium 600 MG capsule Take 1 capsule (600 mg total) by mouth at bedtime. 06/26/22 09/24/22  Neysa Hotter, MD   lithium carbonate 300 MG capsule Take 1 capsule (300 mg total) by mouth at bedtime for 7 days. 07/05/22 07/12/22  Neysa Hotter, MD  methotrexate (RHEUMATREX) 2.5 MG tablet Take 6 tablets by mouth once a week. 02/07/22   [provider]  mupirocin ointment (BACTROBAN) 2 % Apply 1 Application topically 2 (two) times daily. 08/09/22   Valentino Nose, NP  omeprazole (PRILOSEC) 40 MG capsule Take 1 capsule by mouth every morning. 05/25/20   [provider]  risperidone (RISPERDAL) 4 MG tablet Take 1 tablet (4 mg total) by mouth at bedtime. 06/20/22   Shon Hale, MD  sertraline (ZOLOFT) 100 MG tablet Take 2 tablets (200 mg total) by mouth daily. 06/20/22 06/20/23  Shon Hale, MD      Allergies    Patient has no known allergies.    Review of Systems   Review of Systems  Respiratory: Negative.    Cardiovascular: Negative.   Gastrointestinal: Negative.   Neurological:  Positive for headaches.  Psychiatric/Behavioral:  Positive for agitation. The patient is nervous/anxious.     Physical Exam Updated Vital Signs BP (!) 163/101   Pulse (!) 115   Temp 98.9 F (37.2 C) (Oral)   Resp 19   Wt 120 kg   SpO2 98%   BMI 36.90 kg/m  Physical Exam Vitals and nursing note reviewed.  Constitutional:  General: He is not in acute distress.    Appearance: He is diaphoretic. He is not toxic-appearing.  HENT:     Head: Normocephalic and atraumatic.  Eyes:     General: No scleral icterus.    Conjunctiva/sclera: Conjunctivae normal.  Cardiovascular:     Rate and Rhythm: Normal rate and regular rhythm.     Pulses: Normal pulses.     Heart sounds: Normal heart sounds.  Pulmonary:     Effort: Pulmonary effort is normal. No respiratory distress.     Breath sounds: Normal breath sounds.  Abdominal:     General: Abdomen is flat. Bowel sounds are normal.     Palpations: Abdomen is soft.     Tenderness: There is no abdominal tenderness.  Skin:    General: Skin is warm.      Findings: No lesion.  Neurological:     Mental Status: He is alert and oriented to person, place, and time.     Sensory: Sensory deficit present.     Motor: No weakness.     Gait: Gait normal.     Deep Tendon Reflexes: Reflexes normal.  Psychiatric:        Mood and Affect: Mood is anxious.     ED Results / Procedures / Treatments   Labs (all labs ordered are listed, but only abnormal results are displayed) Labs Reviewed  COMPREHENSIVE METABOLIC PANEL - Abnormal; Notable for the following components:      Result Value   Glucose, Bld 206 (*)    Total Protein 6.4 (*)    All other components within normal limits  CBC WITH DIFFERENTIAL/PLATELET - Abnormal; Notable for the following components:   WBC 11.6 (*)    RDW 16.9 (*)    Platelets 118 (*)    Neutro Abs 8.8 (*)    All other components within normal limits  SALICYLATE LEVEL - Abnormal; Notable for the following components:   Salicylate Lvl <7.0 (*)    All other components within normal limits  ACETAMINOPHEN LEVEL - Abnormal; Notable for the following components:   Acetaminophen (Tylenol), Serum <10 (*)    All other components within normal limits  ETHANOL  RAPID URINE DRUG SCREEN, HOSP PERFORMED    EKG EKG Interpretation Date/Time:  Sunday September 24 2022 22:09:54 EDT Ventricular Rate:  96 PR Interval:  149 QRS Duration:  114 QT Interval:  385 QTC Calculation: 487 R Axis:   88  Text Interpretation: Sinus rhythm Borderline intraventricular conduction delay Borderline prolonged QT interval Confirmed by Eber Hong ([id number]) on 09/24/2022 10:11:23 PM  Radiology CT Head Wo Contrast  Result Date: 09/24/2022 CLINICAL DATA:  Neurologic deficit. EXAM: CT HEAD WITHOUT CONTRAST TECHNIQUE: Contiguous axial images were obtained from the base of the skull through the vertex without intravenous contrast. RADIATION DOSE REDUCTION: This exam was performed according to the departmental dose-optimization program which includes  automated exposure control, adjustment of the mA and/or kV according to patient size and/or use of iterative reconstruction technique. COMPARISON:  Brain MRI dated 06/20/2022. FINDINGS: Brain: The ventricles and sulci are appropriate size for the patient's age. The gray-white matter discrimination is preserved. There is no acute intracranial hemorrhage. No mass effect or midline shift. No extra-axial fluid collection. Vascular: No hyperdense vessel or unexpected calcification. Skull: Normal. Negative for fracture or focal lesion. Sinuses/Orbits: No acute finding. Other: None IMPRESSION: No acute intracranial pathology. Electronically Signed   By: Elgie Collard M.D.   On: 09/24/2022 22:46    Procedures Procedures  Medications Ordered in ED Medications  diazepam (VALIUM) tablet 5 mg (has no administration in time range)  ondansetron (ZOFRAN) injection 4 mg (has no administration in time range)    ED Course/ Medical Decision Making/ A&P                                 Medical Decision Making Amount and/or Complexity of Data Reviewed Labs: ordered. Radiology: ordered.  Risk Prescription drug management.   This patient presents to the ED for concern of HA after ingestion excessive cough and cold medicine containing Dextromethorphan and Chlorpheniramine, reports he took approximately 48 pills today, this involves an extensive number of treatment options, and is a complaint that carries with it a high risk of complications and morbidity.  The differential diagnosis includes overdose, tension headache, migraine, CVA   Co morbidities that complicate the patient evaluation  Mood disorder Substance abuse  HTN    Additional history obtained:  Additional history obtained from chart review    Lab Tests:  I Ordered, and personally interpreted labs.  The pertinent results include:   Acet level unremarkable  Cbc unremarkable  Cmp unremarkable  Salicylate unremarkable  UD pending   Ethanol unremarkable    Imaging Studies ordered:  I ordered imaging studies including CT head   I independently visualized and interpreted imaging which showed no acute intracranial abnormalities  I agree with the radiologist interpretation   Cardiac Monitoring: / EKG:  The patient was maintained on a cardiac monitor.  I personally viewed and interpreted the cardiac monitored which showed an underlying rhythm of: sinus, borderline prolonged QT   Consultations Obtained:  Consult not obtained   Problem List / ED Course / Critical interventions / Medication management  At the time of presentation pt is HTN, tachycardic and anxious reporting HA and ingestion of ocugh and cold medicine containing Dextromethorphan and Chlorpheniramine.  I ordered medication including Diazepam and Zofran  for anxiety and nausea   Reevaluation of the patient after these medicines showed that the patient improved I have reviewed the patients home medicines and have made adjustments as needed   Social Determinants of Health:  Substance abuse Poor follow up  Reports not following with psychology currently    Test / Admission - Considered:     Plan Pt feels better after receiving reassuring CT scan and lab work. He is beginning to feel better after receiving medications and fluid in the ER.   Patient will be discharged home with information for substance abuse disorder as well as a referral to [facility]. Pt agrees to reach out to [facility] and will also f/u w/ PCP.  Pt educated on the risk of substance abuse and encouraged to seek help. Pt will return to ED if sx continue or worsen.         Final Clinical Impression(s) / ED Diagnoses Final diagnoses:  None    Rx / DC Orders ED Discharge Orders     None         Raford Pitcher Evalee Jefferson 09/24/22 [id number]    Eber Hong, MD 09/25/22 [phone]

## 2022-10-03 ENCOUNTER — Telehealth: Payer: Self-pay

## 2022-10-03 NOTE — Telephone Encounter (Signed)
Noted. Please send a referral to [facility].

## 2022-10-03 NOTE — Telephone Encounter (Signed)
received status check on patient referral to [facility]- patient needs a higher level of care.

## 2022-10-05 ENCOUNTER — Encounter (HOSPITAL_COMMUNITY): Payer: Self-pay | Admitting: Emergency Medicine

## 2022-10-05 ENCOUNTER — Emergency Department (HOSPITAL_COMMUNITY)
Admission: EM | Admit: 2022-10-05 | Discharge: 2022-10-06 | Disposition: A | Discharge status: Home / Self Care | Payer: MEDICAID | Attending: Emergency Medicine | Admitting: Emergency Medicine

## 2022-10-05 ENCOUNTER — Other Ambulatory Visit: Payer: Self-pay

## 2022-10-05 DIAGNOSIS — F1910 Other psychoactive substance abuse, uncomplicated: Secondary | ICD-10-CM

## 2022-10-05 DIAGNOSIS — F332 Major depressive disorder, recurrent severe without psychotic features: Secondary | ICD-10-CM | POA: Diagnosis not present

## 2022-10-05 DIAGNOSIS — F32A Depression, unspecified: Secondary | ICD-10-CM | POA: Diagnosis present

## 2022-10-05 DIAGNOSIS — R000 Tachycardia, unspecified: Secondary | ICD-10-CM | POA: Diagnosis not present

## 2022-10-05 DIAGNOSIS — R519 Headache, unspecified: Secondary | ICD-10-CM | POA: Insufficient documentation

## 2022-10-05 DIAGNOSIS — Z79899 Other long term (current) drug therapy: Secondary | ICD-10-CM | POA: Insufficient documentation

## 2022-10-05 DIAGNOSIS — Z794 Long term (current) use of insulin: Secondary | ICD-10-CM | POA: Diagnosis not present

## 2022-10-05 LAB — COMPREHENSIVE METABOLIC PANEL
ALT: 56 U/L — ABNORMAL HIGH (ref 0–44)
AST: 45 U/L — ABNORMAL HIGH (ref 15–41)
Albumin: 4.4 g/dL (ref 3.5–5.0)
Alkaline Phosphatase: 93 U/L (ref 38–126)
Anion gap: 13 (ref 5–15)
BUN: 10 mg/dL (ref 6–20)
CO2: 22 mmol/L (ref 22–32)
Calcium: 9.2 mg/dL (ref 8.9–10.3)
Chloride: 96 mmol/L — ABNORMAL LOW (ref 98–111)
Creatinine, Ser: 0.82 mg/dL (ref 0.61–1.24)
GFR, Estimated: >60 mL/min (ref >60)
Glucose, Bld: 238 mg/dL — ABNORMAL HIGH (ref 70–99)
Potassium: 3.6 mmol/L (ref 3.5–5.1)
Sodium: 131 mmol/L — ABNORMAL LOW (ref 135–145)
Total Bilirubin: 0.6 mg/dL (ref 0.3–1.2)
Total Protein: 7.5 g/dL (ref 6.5–8.1)

## 2022-10-05 LAB — RAPID URINE DRUG SCREEN, HOSP PERFORMED
Amphetamines: NOT DETECTED
Barbiturates: NOT DETECTED
Benzodiazepines: NOT DETECTED
Cocaine: NOT DETECTED
Opiates: POSITIVE — AB
Tetrahydrocannabinol: NOT DETECTED

## 2022-10-05 LAB — CBC WITH DIFFERENTIAL/PLATELET
Abs Immature Granulocytes: 0.04 10*3/uL (ref 0.00–0.07)
Basophils Absolute: 0 10*3/uL (ref 0.0–0.1)
Basophils Relative: 0 %
Eosinophils Absolute: 0.4 10*3/uL (ref 0.0–0.5)
Eosinophils Relative: 3 %
HCT: 44 % (ref 39.0–52.0)
Hemoglobin: 14.6 g/dL (ref 13.0–17.0)
Immature Granulocytes: 0 %
Lymphocytes Relative: 17 %
Lymphs Abs: 1.8 10*3/uL (ref 0.7–4.0)
MCH: 26.1 pg (ref 26.0–34.0)
MCHC: 33.2 g/dL (ref 30.0–36.0)
MCV: 78.7 fL — ABNORMAL LOW (ref 80.0–100.0)
Monocytes Absolute: 0.7 10*3/uL (ref 0.1–1.0)
Monocytes Relative: 7 %
Neutro Abs: 7.8 10*3/uL — ABNORMAL HIGH (ref 1.7–7.7)
Neutrophils Relative %: 73 %
Platelets: 253 10*3/uL (ref 150–400)
RBC: 5.59 MIL/uL (ref 4.22–5.81)
RDW: 16.1 % — ABNORMAL HIGH (ref 11.5–15.5)
WBC: 10.8 10*3/uL — ABNORMAL HIGH (ref 4.0–10.5)
nRBC: 0 % (ref 0.0–0.2)

## 2022-10-05 LAB — ETHANOL: Alcohol, Ethyl (B): <10 mg/dL (ref <10)

## 2022-10-05 LAB — SALICYLATE LEVEL: Salicylate Lvl: <7 mg/dL — ABNORMAL LOW (ref 7.0–30.0)

## 2022-10-05 LAB — ACETAMINOPHEN LEVEL: Acetaminophen (Tylenol), Serum: <10 ug/mL — ABNORMAL LOW (ref 10–30)

## 2022-10-05 MED ORDER — SODIUM CHLORIDE 0.9 % IV BOLUS
1000.0000 mL | Freq: Once | INTRAVENOUS | Status: AC
  Administered 2022-10-05: 1000 mL via INTRAVENOUS

## 2022-10-05 NOTE — ED Notes (Signed)
Patient states he has a addiction problem and  takes 3 boxes a day of cough and cold coricidin hpb. States he has only had 16 of these pills so far today.

## 2022-10-05 NOTE — ED Notes (Signed)
Patient reports to this nurse that he does not take the medicine to harm himself but just can't stop taking it and it is more like an addiction. Denies wanting to fall asleep and not wake up. Reports he does want to live

## 2022-10-05 NOTE — ED Triage Notes (Signed)
Pt states he has on and off headaches for the last year that cause a lot of pressure from his ears to the forehead and down through his sinuses. Pt is anxious and tachycardic.

## 2022-10-05 NOTE — Telephone Encounter (Signed)
referral was faxed and confirmed to [facility] per dr. Vanetta Shawl request.

## 2022-10-06 LAB — LITHIUM LEVEL: Lithium Lvl: <0.06 mmol/L — ABNORMAL LOW (ref 0.60–1.20)

## 2022-10-06 MED ORDER — KETOROLAC TROMETHAMINE 30 MG/ML IJ SOLN
30.0000 mg | Freq: Once | INTRAMUSCULAR | Status: AC
  Administered 2022-10-06: 30 mg via INTRAVENOUS
  Filled 2022-10-06: qty 1

## 2022-10-06 MED ORDER — DIAZEPAM 5 MG/ML IJ SOLN
5.0000 mg | Freq: Once | INTRAMUSCULAR | Status: AC
  Administered 2022-10-06: 5 mg via INTRAVENOUS
  Filled 2022-10-06: qty 2

## 2022-10-06 MED ORDER — PROCHLORPERAZINE EDISYLATE 10 MG/2ML IJ SOLN
10.0000 mg | Freq: Once | INTRAMUSCULAR | Status: AC
  Administered 2022-10-06: 10 mg via INTRAVENOUS
  Filled 2022-10-06: qty 2

## 2022-10-06 NOTE — ED Notes (Signed)
Pt continues to deny si/hi/avh/wishing to be dead

## 2022-10-06 NOTE — ED Notes (Signed)
Pt sleeping. Telesitter at bedside. Pt snoring
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
## 2022-10-06 NOTE — Progress Notes (Signed)
LCSW Progress Note  [id number]   Devynn Chess III  10/06/2022  2:20 AM    Inpatient Behavioral Health Placement  Pt meets inpatient criteria per Rockney Ghee, NP.  Pt informed that he is connected to the [place] due to his time served. CSW sent referral for review. 1st shift CSW to [facility] with [facility].  Destination  Service Provider Address Phone Fax  [facility]  [address], [place] [address] [phone]  [facility]  [place] [phone]  [place] [phone]  [facility] [place] [phone]  [facility]  [address], [place] [address] [phone]  [facility] (after hours)  [address] [place] [address] [phone]  [place] [phone]  [facility]  [address], [place] [address] [phone]    Situation ongoing,  CSW will follow up.    Maryjean Ka, MSW, LCSWA 10/06/2022 2:20 AM

## 2022-10-06 NOTE — ED Provider Notes (Signed)
Emergency Medicine Observation Re-evaluation Note  Herbert Walsh is a 32 y.o. male, seen on rounds today.  Pt initially presented to the ED for complaints of Suicidal (Migraine) Currently, the patient is sleeping.  Physical Exam  BP 120/78 (BP Location: Right Arm)   Pulse 95   Temp 98.5 F (36.9 C) (Oral)   Resp 17   Ht 5\' 11"  (1.803 m)   Wt 122.5 kg   SpO2 95%   BMI 37.66 kg/m  Physical Exam General: No acute distress Cardiac: Well-perfused Lungs: Nonlabored Psych: Calm  ED Course / MDM  EKG:EKG Interpretation Date/Time:  Thursday October 05 2022 20:11:08 EDT Ventricular Rate:  116 PR Interval:  144 QRS Duration:  107 QT Interval:  341 QTC Calculation: 474 R Axis:   90  Text Interpretation: Sinus tachycardia Borderline right axis deviation Nonspecific T abnormalities, anterior leads Borderline prolonged QT interval Confirmed by Gilda Crease [phone]) on 10/06/2022 2:34:10 AM  I have reviewed the labs performed to date as well as medications administered while in observation.  Recent changes in the last 24 hours include medical clearance and psychiatric evaluation.  Plan  Current plan is for inpatient psychiatric placement.  11 AM.  I was informed that psychiatry has now reevaluated patient and feels he does not need acute hospitalization.  Patient is asking to be discharged.  He denies SI or HI.    Terrilee Files, MD 10/06/22 [phone]

## 2022-10-06 NOTE — ED Notes (Signed)
Pt awoke stating he is ready to go home. Pt denies any si/hi/avh or wanting to die. [facility] aware and will get back to me on decision of possible reassessment. Pt a/o. Breakfast given. Telesitter at bedside as well.

## 2022-10-06 NOTE — Progress Notes (Signed)
8:26 AM - CSW spoke with April, transfer coordinator, at [facility] via phone call regarding pt and bed availability. Per April, pt is not fully service-connected with [facility] benefits, therefore pt insurance does not coverage inpatient treatment. CSW will continue to assist with placement.  Cathie Beams, [place]  10/06/2022 8:29 AM

## 2022-10-06 NOTE — ED Provider Notes (Incomplete)
[facility] EMERGENCY DEPARTMENT AT [facility] Provider Note   CSN: [id number] Arrival date & time: 10/05/22  1842     History {Add pertinent medical, surgical, social history, OB history to HPI:1} Chief Complaint  Patient presents with  . Suicidal    Migraine    Herbert Walsh is a 32 y.o. male.  HPI Patient presents with substance use and depression.  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  Uses the generic brand of Coricidin HBP.  that is a mix of chlorpheniramine and dextromethorphan.  Has been seen at outside hospitals for the same. States he has had headaches.  States his face will get numb.  Reviewing notes has had extensive workup without a clear cause.  He thinks it is due to the medicines that he has been taking.  States he does not care if he lives.  States he does not Nestl want to live but is taking the medicine more to get high. does have history of depression.  States he is compliant with his medicines.    Home Medications Prior to Admission medications   Medication Sig Start Date End Date Taking? Authorizing Provider  naproxen (NAPROSYN) 500 MG tablet Take 500 mg by mouth 2 (two) times daily. 09/19/22  Yes [provider]  predniSONE (DELTASONE) 20 MG tablet Take 60 mg by mouth daily. 08/09/22  Yes [provider]  promethazine (PHENERGAN) 25 MG tablet Take 25 mg by mouth every 6 (six) hours as needed. 08/09/22  Yes [provider]  acetaminophen (TYLENOL) 325 MG tablet Take 2 tablets (650 mg total) by mouth every 6 (six) hours as needed for mild pain (or Fever >/= 101). 06/20/22   Shon Hale, MD  allopurinol (ZYLOPRIM) 300 MG tablet Take 300 mg by mouth daily. 10/31/21   [provider]  atorvastatin (LIPITOR) 10 MG tablet Take 10 mg by mouth at bedtime.  09/25/17   [provider]  Cholecalciferol (VITAMIN D3) 50 MCG (2000 UT) TABS Take 1 tablet (50 mcg total) by mouth daily. 06/20/22    Shon Hale, MD  cloNIDine (CATAPRES) 0.2 MG tablet Take 1 tablet (0.2 mg total) by mouth 3 (three) times daily. 10/03/22 01/01/23  Neysa Hotter, MD  folic acid (FOLVITE) 1 MG tablet Take 1 mg by mouth daily. 06/10/20   [provider]  glipiZIDE-metformin (METAGLIP) 5-500 MG tablet Take 1 tablet by mouth daily.    [provider]  HUMIRA 40 MG/0.4ML PSKT Inject 40 mg into the skin once a week.    [provider]  ibuprofen (ADVIL) 400 MG tablet Take 400 mg by mouth every 6 (six) hours as needed for mild pain or moderate pain. 08/09/22   [provider]  lisinopril (PRINIVIL,ZESTRIL) 10 MG tablet Take 10 mg by mouth daily.    [provider]  lithium 600 MG capsule Take 1 capsule (600 mg total) by mouth at bedtime. 06/26/22 09/24/22  Neysa Hotter, MD  lithium carbonate 300 MG capsule Take 1 capsule (300 mg total) by mouth at bedtime for 7 days. 07/05/22 07/12/22  Neysa Hotter, MD  methotrexate (RHEUMATREX) 2.5 MG tablet Take 6 tablets by mouth once a week. 02/07/22   [provider]  mupirocin ointment (BACTROBAN) 2 % Apply 1 Application topically 2 (two) times daily. 08/09/22   Valentino Nose, NP  omeprazole (PRILOSEC) 40 MG capsule Take 1 capsule by mouth every morning. 05/25/20   [provider]  risperidone (  RISPERDAL) 4 MG tablet Take 1 tablet (4 mg total) by mouth at bedtime. 06/20/22   Shon Hale, MD  sertraline (ZOLOFT) 100 MG tablet Take 2 tablets (200 mg total) by mouth daily. 06/20/22 06/20/23  Shon Hale, MD      Allergies    Patient has no known allergies.    Review of Systems   Review of Systems  Physical Exam Updated Vital Signs BP 137/85 (BP Location: Right Arm)   Pulse (!) 103   Temp 98.5 F (36.9 C) (Oral)   Resp (!) 21   Ht 5\' 11"  (1.803 m)   Wt 122.5 kg   SpO2 94%   BMI 37.66 kg/m  Physical Exam  ED Results / Procedures / Treatments   Labs (all labs ordered are listed, but only abnormal  results are displayed) Labs Reviewed  COMPREHENSIVE METABOLIC PANEL - Abnormal; Notable for the following components:      Result Value   Sodium 131 (*)    Chloride 96 (*)    Glucose, Bld 238 (*)    AST 45 (*)    ALT 56 (*)    All other components within normal limits  ACETAMINOPHEN LEVEL - Abnormal; Notable for the following components:   Acetaminophen (Tylenol), Serum <10 (*)    All other components within normal limits  SALICYLATE LEVEL - Abnormal; Notable for the following components:   Salicylate Lvl <7.0 (*)    All other components within normal limits  CBC WITH DIFFERENTIAL/PLATELET - Abnormal; Notable for the following components:   WBC 10.8 (*)    MCV 78.7 (*)    RDW 16.1 (*)    Neutro Abs 7.8 (*)    All other components within normal limits  RAPID URINE DRUG SCREEN, HOSP PERFORMED - Abnormal; Notable for the following components:   Opiates POSITIVE (*)    All other components within normal limits  ETHANOL    EKG None  Radiology No results found.  Procedures Procedures  {Document cardiac monitor, telemetry assessment procedure when appropriate:1}  Medications Ordered in ED Medications  sodium chloride 0.9 % bolus 1,000 mL (0 mLs Intravenous Stopped 10/05/22 2140)    ED Course/ Medical Decision Making/ A&P   {   Click here for ABCD2, HEART and other calculatorsREFRESH Note before signing :1}                              Medical Decision Making Amount and/or Complexity of Data Reviewed Labs: ordered.   ***  {Document critical care time when appropriate:1} {Document review of labs and clinical decision tools ie heart score, Chads2Vasc2 etc:1}  {Document your independent review of radiology images, and any outside records:1} {Document your discussion with family members, caretakers, and with consultants:1} {Document social determinants of health affecting pt's care:1} {Document your decision making why or why not admission, treatments were  needed:1} Final Clinical Impression(s) / ED Diagnoses Final diagnoses:  None    Rx / DC Orders ED Discharge Orders     None

## 2022-10-06 NOTE — ED Notes (Signed)
Patient evaluated by TTS in patient room

## 2022-10-06 NOTE — ED Notes (Signed)
Confirmed with Jasmine December, telesitter, visual of patient

## 2022-10-06 NOTE — ED Provider Notes (Signed)
[place] EMERGENCY DEPARTMENT AT [facility] Provider Note   CSN: [id number] Arrival date & time: 10/05/22  1842     History  Chief Complaint  Patient presents with   Suicidal    Migraine    Herbert Walsh is a 32 y.o. male.  HPI Patient presents with substance use and depression.  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  Uses the generic brand of Coricidin HBP.  that is a mix of chlorpheniramine and dextromethorphan.  Has been seen at outside hospitals for the same. States he has had headaches.  States his face will get numb.  Reviewing notes has had extensive workup without a clear cause.  He thinks it is due to the medicines that he has been taking.  States he does not care if he lives.  States he does not Nestl want to live but is taking the medicine more to get high. does have history of depression.  States he is compliant with his medicines.    Home Medications Prior to Admission medications   Medication Sig Start Date End Date Taking? Authorizing Provider  naproxen (NAPROSYN) 500 MG tablet Take 500 mg by mouth 2 (two) times daily. 09/19/22  Yes [provider]  predniSONE (DELTASONE) 20 MG tablet Take 60 mg by mouth daily. 08/09/22  Yes [provider]  promethazine (PHENERGAN) 25 MG tablet Take 25 mg by mouth every 6 (six) hours as needed. 08/09/22  Yes [provider]  acetaminophen (TYLENOL) 325 MG tablet Take 2 tablets (650 mg total) by mouth every 6 (six) hours as needed for mild pain (or Fever >/= 101). 06/20/22   Shon Hale, MD  allopurinol (ZYLOPRIM) 300 MG tablet Take 300 mg by mouth daily. 10/31/21   [provider]  atorvastatin (LIPITOR) 10 MG tablet Take 10 mg by mouth at bedtime.  09/25/17   [provider]  Cholecalciferol (VITAMIN D3) 50 MCG (2000 UT) TABS Take 1 tablet (50 mcg total) by mouth daily. 06/20/22   Shon Hale, MD  cloNIDine (CATAPRES) 0.2 MG tablet Take 1  tablet (0.2 mg total) by mouth 3 (three) times daily. 10/03/22 01/01/23  Neysa Hotter, MD  folic acid (FOLVITE) 1 MG tablet Take 1 mg by mouth daily. 06/10/20   [provider]  glipiZIDE-metformin (METAGLIP) 5-500 MG tablet Take 1 tablet by mouth daily.    [provider]  HUMIRA 40 MG/0.4ML PSKT Inject 40 mg into the skin once a week.    [provider]  ibuprofen (ADVIL) 400 MG tablet Take 400 mg by mouth every 6 (six) hours as needed for mild pain or moderate pain. 08/09/22   [provider]  lisinopril (PRINIVIL,ZESTRIL) 10 MG tablet Take 10 mg by mouth daily.    [provider]  lithium 600 MG capsule Take 1 capsule (600 mg total) by mouth at bedtime. 06/26/22 09/24/22  Neysa Hotter, MD  lithium carbonate 300 MG capsule Take 1 capsule (300 mg total) by mouth at bedtime for 7 days. 07/05/22 07/12/22  Neysa Hotter, MD  methotrexate (RHEUMATREX) 2.5 MG tablet Take 6 tablets by mouth once a week. 02/07/22   [provider]  mupirocin ointment (BACTROBAN) 2 % Apply 1 Application topically 2 (two) times daily. 08/09/22   Valentino Nose, NP  omeprazole (PRILOSEC) 40 MG capsule Take 1 capsule by mouth every morning. 05/25/20   [provider]  risperidone (RISPERDAL) 4 MG tablet Take 1 tablet (4 mg  total) by mouth at bedtime. 06/20/22   Shon Hale, MD  sertraline (ZOLOFT) 100 MG tablet Take 2 tablets (200 mg total) by mouth daily. 06/20/22 06/20/23  Shon Hale, MD      Allergies    Patient has no known allergies.    Review of Systems   Review of Systems  Physical Exam Updated Vital Signs BP 137/85 (BP Location: Right Arm)   Pulse (!) 103   Temp 98.5 F (36.9 C) (Oral)   Resp (!) 21   Ht 5\' 11"  (1.803 m)   Wt 122.5 kg   SpO2 94%   BMI 37.66 kg/m  Physical Exam Vitals and nursing note reviewed.  HENT:     Head: Atraumatic.  Cardiovascular:     Rate and Rhythm: Tachycardia present.  Pulmonary:     Effort: Pulmonary  effort is normal.  Abdominal:     Tenderness: There is no abdominal tenderness.  Musculoskeletal:        General: No tenderness.  Skin:    General: Skin is warm.  Neurological:     Mental Status: He is alert and oriented to person, place, and time.     ED Results / Procedures / Treatments   Labs (all labs ordered are listed, but only abnormal results are displayed) Labs Reviewed  COMPREHENSIVE METABOLIC PANEL - Abnormal; Notable for the following components:      Result Value   Sodium 131 (*)    Chloride 96 (*)    Glucose, Bld 238 (*)    AST 45 (*)    ALT 56 (*)    All other components within normal limits  ACETAMINOPHEN LEVEL - Abnormal; Notable for the following components:   Acetaminophen (Tylenol), Serum <10 (*)    All other components within normal limits  SALICYLATE LEVEL - Abnormal; Notable for the following components:   Salicylate Lvl <7.0 (*)    All other components within normal limits  CBC WITH DIFFERENTIAL/PLATELET - Abnormal; Notable for the following components:   WBC 10.8 (*)    MCV 78.7 (*)    RDW 16.1 (*)    Neutro Abs 7.8 (*)    All other components within normal limits  RAPID URINE DRUG SCREEN, HOSP PERFORMED - Abnormal; Notable for the following components:   Opiates POSITIVE (*)    All other components within normal limits  ETHANOL  LITHIUM LEVEL    EKG None  Radiology No results found.  Procedures Procedures    Medications Ordered in ED Medications  sodium chloride 0.9 % bolus 1,000 mL (0 mLs Intravenous Stopped 10/05/22 2140)    ED Course/ Medical Decision Making/ A&P                                 Medical Decision Making Amount and/or Complexity of Data Reviewed Labs: ordered.   Patient presents with substance use.  Uses chlorpheniramine and dextromethorphan.  Has been using for years.  Also depressed and does not care if he dies and does not really want to be alive.  Although states is not actively attempting to hurt  himself.  Has had chronic headaches and has had previous workup do not think we need more at this time.  Blood work done and reassuring although I will not add a lithium level since he appears to be on it.  Patient is medically cleared.  Will have patient seen by TTS.  I have been unable  to verify the medicines that he is on currently.        Final Clinical Impression(s) / ED Diagnoses Final diagnoses:  Polysubstance abuse (HCC)  Depression, unspecified depression type    Rx / DC Orders ED Discharge Orders     None         Benjiman Core, MD 10/06/22 0008

## 2022-10-06 NOTE — Consult Note (Signed)
Patient declines offer of inpatient psychiatric hospitalization at this time and is requesting discharge.  He is not currently under IVC.  He has consistently maintained that he is not actively suicidal and there is currently no evidence in the chart that indicates the patient meets criteria for IVC.  Psychiatry remains available to be consulted if patient condition changes.

## 2022-11-13 ENCOUNTER — Emergency Department (HOSPITAL_COMMUNITY)
Admission: EM | Admit: 2022-11-13 | Discharge: 2022-11-14 | Disposition: A | Discharge status: Home / Self Care | Payer: MEDICAID | Attending: Emergency Medicine | Admitting: Emergency Medicine

## 2022-11-13 ENCOUNTER — Encounter (HOSPITAL_COMMUNITY): Payer: Self-pay | Admitting: Emergency Medicine

## 2022-11-13 ENCOUNTER — Other Ambulatory Visit: Payer: Self-pay

## 2022-11-13 DIAGNOSIS — I10 Essential (primary) hypertension: Secondary | ICD-10-CM | POA: Insufficient documentation

## 2022-11-13 DIAGNOSIS — J45909 Unspecified asthma, uncomplicated: Secondary | ICD-10-CM | POA: Insufficient documentation

## 2022-11-13 DIAGNOSIS — K047 Periapical abscess without sinus: Secondary | ICD-10-CM | POA: Diagnosis not present

## 2022-11-13 DIAGNOSIS — R220 Localized swelling, mass and lump, head: Secondary | ICD-10-CM | POA: Diagnosis present

## 2022-11-13 NOTE — ED Triage Notes (Signed)
Pt c/o abscess and swelling to his left cheek due to a zit.

## 2022-11-14 MED ORDER — PENICILLIN V POTASSIUM 250 MG PO TABS
500.0000 mg | ORAL_TABLET | Freq: Once | ORAL | Status: AC
  Administered 2022-11-14: 500 mg via ORAL
  Filled 2022-11-14: qty 2

## 2022-11-14 MED ORDER — PENICILLIN V POTASSIUM 500 MG PO TABS: 500.0000 mg | ORAL_TABLET | Freq: Four times a day (QID) | ORAL | 0 refills | Status: AC

## 2022-11-14 NOTE — ED Provider Notes (Signed)
[place] EMERGENCY DEPARTMENT AT [facility] Provider Note   CSN: [id number] Arrival date & time: 11/13/22  2029     History  Chief Complaint  Patient presents with   Abscess    Herbert Walsh is a 32 y.o. male.  The history is provided by the patient.  Abscess Patient presents he is concerned about swelling in his left face.  Patient reports he has previous history of multiple facial injuries in the past.  He reports earlier tonight he noticed some pain in his left lower face right above his lips.  He reports it feels swollen.  He is concerned about an abscess.  Patient is requesting a CT scan of his face.    Past Medical History:  Diagnosis Date   Anemia    Arthritis    rheumatoid   Asthma    Depression    GERD (gastroesophageal reflux disease)    History of COVID-19 03/29/2020   Hypertension 2019   Ischemic colitis (HCC) 09/2008   MSSA (methicillin susceptible Staphylococcus aureus)    sternaoclavicular absecess drainage   Osteoarthritis    sterum and right shoulder   PTSD (post-traumatic stress disorder)    Substance use disorder    Tachycardia     Home Medications Prior to Admission medications   Medication Sig Start Date End Date Taking? Authorizing Provider  penicillin v potassium (VEETID) 500 MG tablet Take 1 tablet (500 mg total) by mouth 4 (four) times daily for 7 days. 11/14/22 11/21/22 Yes Zadie Rhine, MD  acetaminophen (TYLENOL) 325 MG tablet Take 2 tablets (650 mg total) by mouth every 6 (six) hours as needed for mild pain (or Fever >/= 101). 06/20/22   Shon Hale, MD  allopurinol (ZYLOPRIM) 300 MG tablet Take 300 mg by mouth daily. 10/31/21   [provider]  atorvastatin (LIPITOR) 10 MG tablet Take 10 mg by mouth at bedtime.  09/25/17   [provider]  Cholecalciferol (VITAMIN D3) 50 MCG (2000 UT) TABS Take 1 tablet (50 mcg total) by mouth daily. 06/20/22   Shon Hale, MD  cloNIDine (CATAPRES) 0.2 MG tablet  Take 1 tablet (0.2 mg total) by mouth 3 (three) times daily. 10/03/22 01/01/23  Neysa Hotter, MD  folic acid (FOLVITE) 1 MG tablet Take 1 mg by mouth daily. 06/10/20   [provider]  glipiZIDE-metformin (METAGLIP) 5-500 MG tablet Take 1 tablet by mouth daily.    [provider]  HUMIRA 40 MG/0.4ML PSKT Inject 40 mg into the skin once a week.    [provider]  ibuprofen (ADVIL) 400 MG tablet Take 400 mg by mouth every 6 (six) hours as needed for mild pain or moderate pain. 08/09/22   [provider]  lisinopril (PRINIVIL,ZESTRIL) 10 MG tablet Take 10 mg by mouth daily.    [provider]  lithium 600 MG capsule Take 1 capsule (600 mg total) by mouth at bedtime. Patient not taking: Reported on 10/06/2022 06/26/22 10/06/22  Neysa Hotter, MD  lithium carbonate 300 MG capsule Take 1 capsule (300 mg total) by mouth at bedtime for 7 days. 07/05/22 10/06/22  Neysa Hotter, MD  methotrexate (RHEUMATREX) 2.5 MG tablet Take 6 tablets by mouth once a week. 02/07/22   [provider]  mupirocin ointment (BACTROBAN) 2 % Apply 1 Application topically 2 (two) times daily. Patient taking differently: Apply 1 Application topically 2 (two) times daily as needed (Dermatitis). 08/09/22   Valentino Nose, NP  naproxen (NAPROSYN) 500 MG tablet  Take 500 mg by mouth 2 (two) times daily as needed for moderate pain. 09/19/22   [provider]  omeprazole (PRILOSEC) 40 MG capsule Take 1 capsule by mouth every morning. 05/25/20   [provider]  risperidone (RISPERDAL) 4 MG tablet Take 1 tablet (4 mg total) by mouth at bedtime. 06/20/22   Shon Hale, MD  sertraline (ZOLOFT) 100 MG tablet Take 2 tablets (200 mg total) by mouth daily. 06/20/22 06/20/23  Shon Hale, MD      Allergies    Patient has no known allergies.    Review of Systems   Review of Systems  Physical Exam Updated Vital Signs BP (!) 151/118 (BP Location: Left Arm)   Pulse (!)  124   Temp 98.7 F (37.1 C) (Oral)   Resp 18   Ht 1.829 m (6')   Wt 122.5 kg   SpO2 97%   BMI 36.62 kg/m  Physical Exam CONSTITUTIONAL: Anxious and disheveled HEAD AND FACE: Normocephalic/atraumatic EYES: EOMI/PERRL, no proptosis ENMT: Mucous membranes moist.  Very poor dentition. He has tenderness to palpation of the gingiva around tooth #10. Mild tenderness to palpation of the face just below the nose and above the lip on the left side.  There is no external abscess.  No angioedema, no drooling or stridor.  No dysphonia. No trismus NECK: supple no meningeal signs NEURO: Pt is awake/alert, moves all extremitiesx4    ED Results / Procedures / Treatments   Labs (all labs ordered are listed, but only abnormal results are displayed) Labs Reviewed - No data to display  EKG None  Radiology No results found.  Procedures Procedures    Medications Ordered in ED Medications  penicillin v potassium (VEETID) tablet 500 mg (500 mg Oral Given 11/14/22 0151)    ED Course/ Medical Decision Making/ A&P                                 Medical Decision Making Risk Prescription drug management.   Patient with very poor dentition presenting with facial pain and gingival pain.  He has an obvious dental abscess but is not amenable to bedside I&D.  There is no significant swelling noted to his face.  He will need to be started on oral antibiotics.  Patient reports he has dental follow-up in about 48 hours.  However he is demanding CT face.  I advised him this is not indicated at this time.  Patient reports he is upset because he just got pulled over by the police on the way to the hospital   Patient is tachycardic but this is likely due to pain and anxiety.  He is overall safe for discharge home.  There is no signs of impending dental emergency or any other life threat emergency at this time       Final Clinical Impression(s) / ED Diagnoses Final diagnoses:  Dental abscess     Rx / DC Orders ED Discharge Orders          Ordered    penicillin v potassium (VEETID) 500 MG tablet  4 times daily        11/14/22 0144              Zadie Rhine, MD 11/14/22 [phone]

## 2023-01-25 ENCOUNTER — Telehealth: Payer: Self-pay | Admitting: Diagnostic Neuroimaging

## 2023-01-25 NOTE — Telephone Encounter (Signed)
Pt called to r/s appt due to no transportation. Rescheduled.

## 2023-01-26 ENCOUNTER — Institutional Professional Consult (permissible substitution): Payer: MEDICAID | Admitting: Diagnostic Neuroimaging

## 2023-01-28 ENCOUNTER — Encounter (HOSPITAL_COMMUNITY): Payer: Self-pay

## 2023-01-28 ENCOUNTER — Emergency Department (HOSPITAL_COMMUNITY): Payer: MEDICAID

## 2023-01-28 ENCOUNTER — Emergency Department (HOSPITAL_COMMUNITY)
Admission: EM | Admit: 2023-01-28 | Discharge: 2023-01-28 | Disposition: A | Discharge status: Home / Self Care | Payer: MEDICAID | Attending: Emergency Medicine | Admitting: Emergency Medicine

## 2023-01-28 ENCOUNTER — Other Ambulatory Visit: Payer: Self-pay

## 2023-01-28 DIAGNOSIS — Z79899 Other long term (current) drug therapy: Secondary | ICD-10-CM | POA: Diagnosis not present

## 2023-01-28 DIAGNOSIS — Z7982 Long term (current) use of aspirin: Secondary | ICD-10-CM | POA: Diagnosis not present

## 2023-01-28 DIAGNOSIS — E119 Type 2 diabetes mellitus without complications: Secondary | ICD-10-CM | POA: Insufficient documentation

## 2023-01-28 DIAGNOSIS — Z7984 Long term (current) use of oral hypoglycemic drugs: Secondary | ICD-10-CM | POA: Insufficient documentation

## 2023-01-28 DIAGNOSIS — I10 Essential (primary) hypertension: Secondary | ICD-10-CM | POA: Diagnosis not present

## 2023-01-28 DIAGNOSIS — K0889 Other specified disorders of teeth and supporting structures: Secondary | ICD-10-CM | POA: Insufficient documentation

## 2023-01-28 DIAGNOSIS — R200 Anesthesia of skin: Secondary | ICD-10-CM | POA: Diagnosis present

## 2023-01-28 DIAGNOSIS — L0212 Furuncle of neck: Secondary | ICD-10-CM | POA: Insufficient documentation

## 2023-01-28 LAB — URINALYSIS, W/ REFLEX TO CULTURE (INFECTION SUSPECTED)
Bacteria, UA: NONE SEEN
Bilirubin Urine: NEGATIVE
Glucose, UA: NEGATIVE mg/dL
Hgb urine dipstick: NEGATIVE
Ketones, ur: NEGATIVE mg/dL
Leukocytes,Ua: NEGATIVE
Nitrite: NEGATIVE
Protein, ur: NEGATIVE mg/dL
Specific Gravity, Urine: 1.003 — ABNORMAL LOW (ref 1.005–1.030)
pH: 6 (ref 5.0–8.0)

## 2023-01-28 LAB — COMPREHENSIVE METABOLIC PANEL
ALT: 16 U/L (ref 0–44)
AST: 17 U/L (ref 15–41)
Albumin: 4.3 g/dL (ref 3.5–5.0)
Alkaline Phosphatase: 98 U/L (ref 38–126)
Anion gap: 12 (ref 5–15)
BUN: 12 mg/dL (ref 6–20)
CO2: 22 mmol/L (ref 22–32)
Calcium: 9.4 mg/dL (ref 8.9–10.3)
Chloride: 102 mmol/L (ref 98–111)
Creatinine, Ser: 0.87 mg/dL (ref 0.61–1.24)
GFR, Estimated: >60 mL/min (ref >60)
Glucose, Bld: 158 mg/dL — ABNORMAL HIGH (ref 70–99)
Potassium: 3.8 mmol/L (ref 3.5–5.1)
Sodium: 136 mmol/L (ref 135–145)
Total Bilirubin: 0.4 mg/dL (ref <1.2)
Total Protein: 7.4 g/dL (ref 6.5–8.1)

## 2023-01-28 LAB — CBC WITH DIFFERENTIAL/PLATELET
Abs Immature Granulocytes: 0.01 10*3/uL (ref 0.00–0.07)
Basophils Absolute: 0 10*3/uL (ref 0.0–0.1)
Basophils Relative: 0 %
Eosinophils Absolute: 0.1 10*3/uL (ref 0.0–0.5)
Eosinophils Relative: 2 %
HCT: 45 % (ref 39.0–52.0)
Hemoglobin: 14.4 g/dL (ref 13.0–17.0)
Immature Granulocytes: 0 %
Lymphocytes Relative: 17 %
Lymphs Abs: 1.2 10*3/uL (ref 0.7–4.0)
MCH: 25.5 pg — ABNORMAL LOW (ref 26.0–34.0)
MCHC: 32 g/dL (ref 30.0–36.0)
MCV: 79.6 fL — ABNORMAL LOW (ref 80.0–100.0)
Monocytes Absolute: 0.3 10*3/uL (ref 0.1–1.0)
Monocytes Relative: 5 %
Neutro Abs: 5.3 10*3/uL (ref 1.7–7.7)
Neutrophils Relative %: 76 %
Platelets: 284 10*3/uL (ref 150–400)
RBC: 5.65 MIL/uL (ref 4.22–5.81)
RDW: 15.6 % — ABNORMAL HIGH (ref 11.5–15.5)
WBC: 7 10*3/uL (ref 4.0–10.5)
nRBC: 0 % (ref 0.0–0.2)

## 2023-01-28 LAB — PROTIME-INR
INR: 1 (ref 0.8–1.2)
Prothrombin Time: 13.1 s (ref 11.4–15.2)

## 2023-01-28 LAB — RAPID URINE DRUG SCREEN, HOSP PERFORMED
Amphetamines: NOT DETECTED
Barbiturates: NOT DETECTED
Benzodiazepines: NOT DETECTED
Cocaine: NOT DETECTED
Opiates: NOT DETECTED
Tetrahydrocannabinol: NOT DETECTED

## 2023-01-28 LAB — LACTIC ACID, PLASMA
Lactic Acid, Venous: 2.7 mmol/L (ref 0.5–1.9)
Lactic Acid, Venous: 1.3 mmol/L (ref 0.5–1.9)

## 2023-01-28 LAB — APTT: aPTT: 30 s (ref 24–36)

## 2023-01-28 MED ORDER — MUPIROCIN 2 % EX OINT: 1.0000 | TOPICAL_OINTMENT | Freq: Two times a day (BID) | CUTANEOUS | 0 refills | Status: DC

## 2023-01-28 MED ORDER — AMOXICILLIN-POT CLAVULANATE 875-125 MG PO TABS
1.0000 | ORAL_TABLET | Freq: Once | ORAL | Status: AC
  Administered 2023-01-28: 1 via ORAL
  Filled 2023-01-28: qty 1

## 2023-01-28 MED ORDER — KETOROLAC TROMETHAMINE 15 MG/ML IJ SOLN
15.0000 mg | Freq: Once | INTRAMUSCULAR | Status: AC
  Administered 2023-01-28: 15 mg via INTRAVENOUS
  Filled 2023-01-28: qty 1

## 2023-01-28 MED ORDER — SODIUM CHLORIDE 0.9 % IV BOLUS
1000.0000 mL | Freq: Once | INTRAVENOUS | Status: AC
  Administered 2023-01-28: 1000 mL via INTRAVENOUS

## 2023-01-28 NOTE — ED Provider Notes (Signed)
[place] EMERGENCY DEPARTMENT AT [facility] Provider Note   CSN: [id number] Arrival date & time: 01/28/23  1032     History  Chief Complaint  Patient presents with   Dental Pain   Tingling    Herbert Walsh is a 32 y.o. male.  Has PMH of diabetes, HTN, MSSA bacteremia, intermittent explosive disorder, RA on Humira, polysubstance abuse, chronic dental infections.  He presents ER today stating this morning he "started feeling septic".  He states his veins in his arms and legs "feel hard" and his whole body feels numb and tingly.  He reports he has been having intermittent left-sided facial numbness for months, this started in May and was admitted to the hospital at time had CT of the head CTA of his head and neck and MRI all of which were normal but he states symptoms tend to come and go.  Notes he has chronic dental infections but having chronic right lower jaw pain but denies any facial swelling.  Denies fevers or chills but states he just feels unwell, his mother left him home to go to work today and he does not have a vehicle and since he was feeling unwell he decided to call 911.  He reports he supposed to have dental surgery tomorrow to finally extract his problematic teeth, however states he does not know if he can go if he is still feeling this poorly.   Dental Pain      Home Medications Prior to Admission medications   Medication Sig Start Date End Date Taking? Authorizing Provider  amoxicillin (AMOXIL) 500 MG capsule Take 500 mg by mouth 3 (three) times daily.   Yes [provider]  aspirin 325 MG tablet Take 650 mg by mouth daily.   Yes [provider]  acetaminophen (TYLENOL) 325 MG tablet Take 2 tablets (650 mg total) by mouth every 6 (six) hours as needed for mild pain (or Fever >/= 101). 06/20/22   Shon Hale, MD  allopurinol (ZYLOPRIM) 300 MG tablet Take 300 mg by mouth daily. 10/31/21  Yes [provider]   amoxicillin-clavulanate (AUGMENTIN) 875-125 MG tablet Take 1 tablet by mouth 2 (two) times daily.    [provider]  atorvastatin (LIPITOR) 10 MG tablet Take 10 mg by mouth at bedtime.  09/25/17   [provider]  Cholecalciferol (VITAMIN D3) 50 MCG (2000 UT) TABS Take 1 tablet (50 mcg total) by mouth daily. 06/20/22  Yes Emokpae, Courage, MD  cloNIDine (CATAPRES) 0.2 MG tablet Take 1 tablet (0.2 mg total) by mouth 3 (three) times daily. 10/03/22 01/28/23 Yes Hisada, Barbee Cough, MD  folic acid (FOLVITE) 1 MG tablet Take 1 mg by mouth daily. 06/10/20  Yes [provider]  glipiZIDE-metformin (METAGLIP) 5-500 MG tablet Take 1 tablet by mouth daily.   Yes [provider]  HUMIRA 40 MG/0.4ML PSKT Inject 40 mg into the skin once a week.    [provider]  ibuprofen (ADVIL) 400 MG tablet Take 400 mg by mouth every 6 (six) hours as needed for mild pain or moderate pain. 08/09/22   [provider]  lisinopril (PRINIVIL,ZESTRIL) 10 MG tablet Take 10 mg by mouth daily.   Yes [provider]  lithium 600 MG capsule Take 1 capsule (600 mg total) by mouth at bedtime. Patient not taking: Reported on 10/06/2022 06/26/22 10/06/22  Neysa Hotter, MD  lithium carbonate 300 MG capsule Take 1 capsule (300 mg total) by mouth at bedtime for 7 days.  07/05/22 10/06/22  Neysa Hotter, MD  methotrexate (RHEUMATREX) 2.5 MG tablet Take 6 tablets by mouth once a week. 02/07/22  Yes [provider]  mupirocin ointment (BACTROBAN) 2 % Apply 1 Application topically 2 (two) times daily. 01/28/23   Carmel Sacramento A, PA-C  naproxen (NAPROSYN) 500 MG tablet Take 500 mg by mouth 2 (two) times daily as needed for moderate pain. 09/19/22  Yes [provider]  omeprazole (PRILOSEC) 40 MG capsule Take 1 capsule by mouth every morning. 05/25/20  Yes [provider]  risperidone (RISPERDAL) 4 MG tablet Take 1 tablet (4 mg total) by mouth at bedtime. 06/20/22   Shon Hale, MD  sertraline (ZOLOFT) 100 MG tablet Take 2 tablets (200 mg total) by mouth daily. 06/20/22 06/20/23 Yes Shon Hale, MD      Allergies    Patient has no known allergies.    Review of Systems   Review of Systems  Physical Exam Updated Vital Signs BP (!) 127/93   Pulse 81   Temp 98.9 F (37.2 C) (Oral)   Resp 18   Ht 6' (1.829 m)   Wt 118.8 kg   SpO2 100%   BMI 35.53 kg/m  Physical Exam Vitals and nursing note reviewed.  Constitutional:      General: He is not in acute distress.    Appearance: He is well-developed.  HENT:     Head: Normocephalic and atraumatic.     Mouth/Throat:     Mouth: Mucous membranes are moist.     Pharynx: No posterior oropharyngeal erythema.     Comments: Very poor dentition, pain right lower molars, no drainable collection  Eyes:     Extraocular Movements: Extraocular movements intact.     Conjunctiva/sclera: Conjunctivae normal.     Pupils: Pupils are equal, round, and reactive to light.  Cardiovascular:     Rate and Rhythm: Normal rate and regular rhythm.     Heart sounds: No murmur heard. Pulmonary:     Effort: Pulmonary effort is normal. No respiratory distress.     Breath sounds: Normal breath sounds.  Abdominal:     Palpations: Abdomen is soft.     Tenderness: There is no abdominal tenderness.  Musculoskeletal:        General: No swelling or tenderness. Normal range of motion.     Cervical back: Neck supple.  Skin:    General: Skin is warm and dry.     Capillary Refill: Capillary refill takes less than 2 seconds.     Comments: Small tender pustule left posterior neck with no drainage, fluctuance or induration  Neurological:     General: No focal deficit present.     Mental Status: He is alert and oriented to person, place, and time.     GCS: GCS eye subscore is 4. GCS verbal subscore is 5. GCS motor subscore is 6.     Cranial Nerves: No cranial nerve deficit, dysarthria or facial asymmetry.     Motor: No weakness.      Coordination: Coordination normal.     Gait: Gait normal.  Psychiatric:        Mood and Affect: Mood normal.        Thought Content: Thought content normal.     Comments: Pt noted to have pressured speech     ED Results / Procedures / Treatments   Labs (all labs ordered are listed, but only abnormal results are displayed) Labs Reviewed  LACTIC ACID, PLASMA - Abnormal; Notable for  the following components:      Result Value   Lactic Acid, Venous 2.7 (*)    All other components within normal limits  COMPREHENSIVE METABOLIC PANEL - Abnormal; Notable for the following components:   Glucose, Bld 158 (*)    All other components within normal limits  CBC WITH DIFFERENTIAL/PLATELET - Abnormal; Notable for the following components:   MCV 79.6 (*)    MCH 25.5 (*)    RDW 15.6 (*)    All other components within normal limits  URINALYSIS, W/ REFLEX TO CULTURE (INFECTION SUSPECTED) - Abnormal; Notable for the following components:   Color, Urine COLORLESS (*)    Specific Gravity, Urine 1.003 (*)    All other components within normal limits  CULTURE, BLOOD (ROUTINE X 2)  CULTURE, BLOOD (ROUTINE X 2)  LACTIC ACID, PLASMA  PROTIME-INR  APTT  RAPID URINE DRUG SCREEN, HOSP PERFORMED    EKG EKG Interpretation Date/Time:  Sunday January 28 2023 11:26:58 EST Ventricular Rate:  105 PR Interval:  141 QRS Duration:  104 QT Interval:  318 QTC Calculation: 421 R Axis:   121  Text Interpretation: Right and left arm electrode reversal, interpretation assumes no reversal Sinus tachycardia Right axis deviation Confirmed by Meridee Score [phone]) on 01/28/2023 11:29:52 AM  Radiology DG Chest Port 1 View Result Date: 01/28/2023 CLINICAL DATA:  32 year old male with possible sepsis. EXAM: PORTABLE CHEST 1 VIEW COMPARISON:  Chest radiographs 02/04/2022 and earlier. FINDINGS: Portable AP view at 1149 hours. Lower lung volumes. Normal cardiac size and mediastinal contours. Visualized tracheal air  column is within normal limits. Allowing for portable technique the lungs are clear. No pneumothorax or pleural effusion. Paucity of bowel gas. No acute osseous abnormality identified. IMPRESSION: Low lung volumes, otherwise no acute cardiopulmonary abnormality. Electronically Signed   By: Odessa Fleming M.D.   On: 01/28/2023 12:19    Procedures Procedures    Medications Ordered in ED Medications  sodium chloride 0.9 % bolus 1,000 mL (0 mLs Intravenous Stopped 01/28/23 1418)  ketorolac (TORADOL) 15 MG/ML injection 15 mg (15 mg Intravenous Given 01/28/23 1128)  amoxicillin-clavulanate (AUGMENTIN) 875-125 MG per tablet 1 tablet (1 tablet Oral Given 01/28/23 1417)    ED Course/ Medical Decision Making/ A&P Clinical Course as of 01/28/23 1443  Sun Jan 28, 2023  1109 Patient presents for feeling unwell and states he is "feeling septic".  He does have history of bacteremia, very poor dentition.  Noted to be tachycardic but afebrile and nontoxic in appearance will get screening labs.  His symptoms are all very vague and nonspecific stating he feels like his veins are hard in his arms and legs.  Reports his legs feel heavy but easily lifts both legs off the bed several feet to demonstrate.  Reports sensation of numbness all over his body but can feel me touching him on exam and states he has had intermittent left facial numbness ongoing since May and is already had full workup with this including CT, CTA and MRI which were all unremarkable and symptoms have been unchanged.  I do not feel he needs further workup at this time especially given there is no facial asymmetry and symptoms are intermittent.  Plan to obtain labs, give IV fluids and Toradol for pain and reassess.  He has chronic right lower dental pain, has very poor dentition but no sublingual or submental swelling or tenderness or facial swelling or redness.  Do not feel he needs imaging of his face or neck at this  time.  He speaks in full and clear  sentences and handles oral secretions well. [CB]    Clinical Course User Index [CB] Ma Rings, PA-C                                 Medical Decision Making This patient presents to the ED for concern of dental pain, numbness all over his body, this involves an extensive number of treatment options, and is a complaint that carries with it a high risk of complications and morbidity.  The differential diagnosis includes apical abscess, dental caries, Ludwig's angina, sepsis, anxiety,   Co morbidities that complicate the patient evaluation  History of bacteremia, plus is obvious, poor dentition   Additional history obtained:  Additional history obtained from EMR External records from outside source obtained and reviewed including prior notes, prior labs and imaging   Lab Tests:  I Ordered, and personally interpreted labs.  The pertinent results include: Lactic acid 2.7>>1.3, no leukocytosis, no anemia, CMP normal, drug screen negative, no UTI   Imaging Studies ordered:  I ordered imaging studies including chest x-ray I independently visualized and interpreted imaging which showed no pulmonary edema or infiltrate or pneumothorax I agree with the radiologist interpretation     Problem List / ED Course / Critical interventions / Medication management  Patient comes in with multiple vague complaints today he is complaining of dental pain he does have poor dentition and is planning on having extractions but is having somewhat worse pain in the left lower jaw with no swelling or signs of deep space abscess.  Speaks in full clear sentences and handles oral secretions well.  Need for imaging will give Augmentin.  He was stating "I feel like I am getting septic" so did get labs given his initial tachycardia which were additionally normal except for slightly elevated lactic acid which resolved after liter of fluids.  He is never febrile.  Feel a lot of the tachycardia was related to  anxiety and discomfort from his tooth pain.  He was also complaining of numbness over his whole body and of his entire head which has been going on initially he stated for months but states it truly started 5 or 6 years ago.  Use was to have a neurology appointment this past week but did not have transportation so missed his appointment.  He has had neuroimaging this year for same complaint.  He has no focal deficits, ambulating in the ED without difficulty do not feel he needs further neurologic workup and can follow-up as an outpatient.  Plan of care and discharge.  Prior to discharge she complained of a bump on his neck, is a very small pustule, no sign of abscess or significant cellulitis, will give mupirocin. I ordered medication including Toradol for pain Reevaluation of the patient after these medicines showed that the patient improved I have reviewed the patients home medicines and have made adjustments as needed    Amount and/or Complexity of Data Reviewed Labs: ordered. Radiology: ordered. ECG/medicine tests: ordered.  Risk Prescription drug management.           Final Clinical Impression(s) / ED Diagnoses Final diagnoses:  Pain, dental    Rx / DC Orders ED Discharge Orders          Ordered    mupirocin ointment (BACTROBAN) 2 %  2 times daily        01/28/23 1432  Ma Rings, [place] 01/28/23 1443    Terrilee Files, MD 01/28/23 1739

## 2023-01-28 NOTE — Discharge Instructions (Signed)
It was a pleasure taking care of you today.  You are seen in the emergency department for dental pain and numbness all over your body.  Your workup was overall very reassuring.  We are to treat you with antibiotics for your possible dental infection.  It is really important that you follow-up with your dentist next week for your scheduled extraction.  You should also follow-up with neurology, I understand that you missed your appointment you will need to call to reschedule for this ongoing numbness you have been having for several months.  Come back to the ER if you have any new or worsening symptoms especially if you develop fevers or chills.  We did collect blood cultures and you will get a call back if these are positive

## 2023-01-28 NOTE — ED Triage Notes (Signed)
Pt is on abt for infection in mouth on the right side. Pt states he is tingling all over his whole body, mainly on the left side of head and face. Pt request no one calls his emergency contact. Denies fevers.

## 2023-02-02 LAB — CULTURE, BLOOD (ROUTINE X 2)
Culture: NO GROWTH
Culture: NO GROWTH
Special Requests: ADEQUATE
Special Requests: ADEQUATE

## 2023-03-07 ENCOUNTER — Other Ambulatory Visit: Payer: Self-pay

## 2023-03-07 ENCOUNTER — Emergency Department (HOSPITAL_COMMUNITY)
Admission: EM | Admit: 2023-03-07 | Discharge: 2023-03-08 | Disposition: A | Discharge status: Home / Self Care | Payer: MEDICAID | Attending: Emergency Medicine | Admitting: Emergency Medicine

## 2023-03-07 ENCOUNTER — Emergency Department (HOSPITAL_COMMUNITY): Payer: MEDICAID

## 2023-03-07 ENCOUNTER — Encounter (HOSPITAL_COMMUNITY): Payer: Self-pay

## 2023-03-07 DIAGNOSIS — Z7984 Long term (current) use of oral hypoglycemic drugs: Secondary | ICD-10-CM | POA: Diagnosis not present

## 2023-03-07 DIAGNOSIS — Z794 Long term (current) use of insulin: Secondary | ICD-10-CM | POA: Insufficient documentation

## 2023-03-07 DIAGNOSIS — E119 Type 2 diabetes mellitus without complications: Secondary | ICD-10-CM | POA: Diagnosis not present

## 2023-03-07 DIAGNOSIS — Z7982 Long term (current) use of aspirin: Secondary | ICD-10-CM | POA: Insufficient documentation

## 2023-03-07 DIAGNOSIS — R519 Headache, unspecified: Secondary | ICD-10-CM | POA: Insufficient documentation

## 2023-03-07 LAB — BASIC METABOLIC PANEL
Anion gap: 15 (ref 5–15)
BUN: 6 mg/dL (ref 6–20)
CO2: 23 mmol/L (ref 22–32)
Calcium: 9.5 mg/dL (ref 8.9–10.3)
Chloride: 101 mmol/L (ref 98–111)
Creatinine, Ser: 0.87 mg/dL (ref 0.61–1.24)
GFR, Estimated: >60 mL/min (ref >60)
Glucose, Bld: 141 mg/dL — ABNORMAL HIGH (ref 70–99)
Potassium: 3.9 mmol/L (ref 3.5–5.1)
Sodium: 139 mmol/L (ref 135–145)

## 2023-03-07 LAB — CBC WITH DIFFERENTIAL/PLATELET
Abs Immature Granulocytes: 0.02 10*3/uL (ref 0.00–0.07)
Basophils Absolute: 0 10*3/uL (ref 0.0–0.1)
Basophils Relative: 0 %
Eosinophils Absolute: 0.2 10*3/uL (ref 0.0–0.5)
Eosinophils Relative: 2 %
HCT: 43.7 % (ref 39.0–52.0)
Hemoglobin: 14 g/dL (ref 13.0–17.0)
Immature Granulocytes: 0 %
Lymphocytes Relative: 15 %
Lymphs Abs: 1.4 10*3/uL (ref 0.7–4.0)
MCH: 24.6 pg — ABNORMAL LOW (ref 26.0–34.0)
MCHC: 32 g/dL (ref 30.0–36.0)
MCV: 76.8 fL — ABNORMAL LOW (ref 80.0–100.0)
Monocytes Absolute: 0.5 10*3/uL (ref 0.1–1.0)
Monocytes Relative: 5 %
Neutro Abs: 7.6 10*3/uL (ref 1.7–7.7)
Neutrophils Relative %: 78 %
Platelets: 275 10*3/uL (ref 150–400)
RBC: 5.69 MIL/uL (ref 4.22–5.81)
RDW: 14.1 % (ref 11.5–15.5)
WBC: 9.8 10*3/uL (ref 4.0–10.5)
nRBC: 0 % (ref 0.0–0.2)

## 2023-03-07 NOTE — ED Triage Notes (Addendum)
Pt arrived POV for left sided facial & head numbness that has been going for several months, but today got worse while in [employer], pt feels he has a slight facial droop, smile is symmetrical, pt also reports sinus drainage with post-nasal drip. Pt also reports worsening vision to left eye over the past few weeks, but suppose to wear corrected lens. Pt very fidgety in triage, continually rubbing left side of face and unable to sit still. Reports has been seen for same in past and feels "everyone is dismissing me, I know I am not stupid, I get frustrated very easily now, and my BP has been elevated, but I know it's from the stress". PT stated "I know something fuck up with the left side of me, it is driving me crazy, I think I rub out a blood clot on the side of my face". Denies SI/HI. Recent use of ABX for dental extraction 01/2023. Also reporting he thinks something is wrong with my left ear, "it's unbearable at this point".  Hx of polysubstance abuse, last use was a month ago, reports over use of cough medications.

## 2023-03-07 NOTE — ED Notes (Signed)
Urine specimen sent to lab if needed for an add on later

## 2023-03-07 NOTE — ED Notes (Signed)
PA-C Upstill at bedside for MSE and to advised on protocols to start in triage.

## 2023-03-08 MED ORDER — GABAPENTIN 100 MG PO CAPS: 100.0000 mg | ORAL_CAPSULE | Freq: Three times a day (TID) | ORAL | 0 refills | Status: DC | PRN

## 2023-03-08 MED ORDER — GABAPENTIN 100 MG PO CAPS
100.0000 mg | ORAL_CAPSULE | Freq: Once | ORAL | Status: AC
  Administered 2023-03-08: 100 mg via ORAL
  Filled 2023-03-08: qty 1

## 2023-03-08 NOTE — ED Provider Notes (Signed)
[place] EMERGENCY DEPARTMENT AT [facility] Provider Note   CSN: [id number] Arrival date & time: 03/07/23  2003     History  Chief Complaint  Patient presents with   Multiple Complaints     Herbert Walsh is a 33 y.o. male.  The history is provided by the patient and medical records.  Herbert Walsh is a 33 y.o. male who presents to the Emergency Department complaining of facial pain.  He presents to the emergency department for evaluation of pain to the left side of his face, particularly his ear for several months, worsening over the last several hours.  He describes it as a numb feeling where he cannot feel that the left hemiface and left neck as well as sharp and razor blade sensations that last for seconds at a time in the left face, ear.  He feels like his ear is clogged and that there is water in his ear.  He is concerned that there might be a blood clot decrease in the flow to the left side of his head.  No clear alleviating or worsening factors.  No reported injuries.  No fevers.  No symptoms involving his arms or legs.  He has a history of rheumatoid arthritis and takes Biologics but has not been on them for several weeks.  He did have oral surgery December 16 and was on antibiotics for 2 weeks.  Also has a history of diabetes, depression and anxiety as well as PTSD.      Home Medications Prior to Admission medications   Medication Sig Start Date End Date Taking? Authorizing Provider  gabapentin (NEURONTIN) 100 MG capsule Take 1 capsule (100 mg total) by mouth 3 (three) times daily as needed. 03/08/23  Yes Tilden Fossa, MD  acetaminophen (TYLENOL) 325 MG tablet Take 2 tablets (650 mg total) by mouth every 6 (six) hours as needed for mild pain (or Fever >/= 101). 06/20/22   Shon Hale, MD  allopurinol (ZYLOPRIM) 300 MG tablet Take 300 mg by mouth daily. 10/31/21   [provider]  amoxicillin (AMOXIL) 500 MG capsule Take 500 mg by mouth 3 (three) times  daily.    [provider]  amoxicillin-clavulanate (AUGMENTIN) 875-125 MG tablet Take 1 tablet by mouth 2 (two) times daily.    [provider]  aspirin 325 MG tablet Take 650 mg by mouth daily.    [provider]  atorvastatin (LIPITOR) 10 MG tablet Take 10 mg by mouth at bedtime.  09/25/17   [provider]  Cholecalciferol (VITAMIN D3) 50 MCG (2000 UT) TABS Take 1 tablet (50 mcg total) by mouth daily. 06/20/22   Shon Hale, MD  cloNIDine (CATAPRES) 0.2 MG tablet Take 1 tablet (0.2 mg total) by mouth 3 (three) times daily. 10/03/22 01/28/23  Neysa Hotter, MD  folic acid (FOLVITE) 1 MG tablet Take 1 mg by mouth daily. 06/10/20   [provider]  glipiZIDE-metformin (METAGLIP) 5-500 MG tablet Take 1 tablet by mouth daily.    [provider]  HUMIRA 40 MG/0.4ML PSKT Inject 40 mg into the skin once a week.    [provider]  ibuprofen (ADVIL) 400 MG tablet Take 400 mg by mouth every 6 (six) hours as needed for mild pain or moderate pain. 08/09/22   [provider]  lisinopril (PRINIVIL,ZESTRIL) 10 MG tablet Take 10 mg by mouth daily.    [provider]  lithium 600 MG capsule Take 1 capsule (600 mg total) by  mouth at bedtime. Patient not taking: Reported on 10/06/2022 06/26/22 10/06/22  Neysa Hotter, MD  lithium carbonate 300 MG capsule Take 1 capsule (300 mg total) by mouth at bedtime for 7 days. 07/05/22 10/06/22  Neysa Hotter, MD  methotrexate (RHEUMATREX) 2.5 MG tablet Take 6 tablets by mouth once a week. 02/07/22   [provider]  mupirocin ointment (BACTROBAN) 2 % Apply 1 Application topically 2 (two) times daily. 01/28/23   Carmel Sacramento A, PA-C  naproxen (NAPROSYN) 500 MG tablet Take 500 mg by mouth 2 (two) times daily as needed for moderate pain. 09/19/22   [provider]  omeprazole (PRILOSEC) 40 MG capsule Take 1 capsule by mouth every morning. 05/25/20   [provider]  risperidone  (RISPERDAL) 4 MG tablet Take 1 tablet (4 mg total) by mouth at bedtime. 06/20/22   Shon Hale, MD  sertraline (ZOLOFT) 100 MG tablet Take 2 tablets (200 mg total) by mouth daily. 06/20/22 06/20/23  Shon Hale, MD      Allergies    Patient has no known allergies.    Review of Systems   Review of Systems  All other systems reviewed and are negative.   Physical Exam Updated Vital Signs BP (!) 120/93 (BP Location: Left Arm)   Pulse 98   Temp 97.8 F (36.6 C)   Resp 18   Ht 5' 11.5" (1.816 m)   Wt 122.5 kg   SpO2 96%   BMI 37.13 kg/m  Physical Exam Vitals and nursing note reviewed.  Constitutional:      Appearance: He is well-developed.  HENT:     Head: Normocephalic and atraumatic.     Comments: Edentulous without any swelling or erythema in the oropharynx    Right Ear: Tympanic membrane normal.     Left Ear: Tympanic membrane normal.     Nose: Nose normal.     Mouth/Throat:     Mouth: Mucous membranes are moist.  Eyes:     General:        Right eye: No discharge.        Left eye: No discharge.     Extraocular Movements: Extraocular movements intact.     Pupils: Pupils are equal, round, and reactive to light.  Cardiovascular:     Rate and Rhythm: Normal rate and regular rhythm.     Heart sounds: No murmur heard. Pulmonary:     Effort: Pulmonary effort is normal. No respiratory distress.     Breath sounds: Normal breath sounds.  Abdominal:     Palpations: Abdomen is soft.     Tenderness: There is no abdominal tenderness. There is no guarding or rebound.  Musculoskeletal:        General: No tenderness.     Cervical back: Normal range of motion and neck supple.  Lymphadenopathy:     Cervical: No cervical adenopathy.  Skin:    General: Skin is warm and dry.  Neurological:     Mental Status: He is alert and oriented to person, place, and time.     Comments: No asymmetry of facial movements.  Visual fields grossly intact.  There is altered sensation to light  touch in the left face.  He is able to spiel throughout the left and right face.  5 out of 5 strength in all 4 extremities  Psychiatric:        Behavior: Behavior normal.     ED Results / Procedures / Treatments   Labs (all labs ordered are listed,  but only abnormal results are displayed) Labs Reviewed  BASIC METABOLIC PANEL - Abnormal; Notable for the following components:      Result Value   Glucose, Bld 141 (*)    All other components within normal limits  CBC WITH DIFFERENTIAL/PLATELET - Abnormal; Notable for the following components:   MCV 76.8 (*)    MCH 24.6 (*)    All other components within normal limits    EKG None  Radiology CT Head Wo Contrast Result Date: 03/07/2023 CLINICAL DATA:  Left-sided facial and hand numbness for several months, facial droop EXAM: CT HEAD WITHOUT CONTRAST TECHNIQUE: Contiguous axial images were obtained from the base of the skull through the vertex without intravenous contrast. RADIATION DOSE REDUCTION: This exam was performed according to the departmental dose-optimization program which includes automated exposure control, adjustment of the mA and/or kV according to patient size and/or use of iterative reconstruction technique. COMPARISON:  09/24/2022 FINDINGS: Brain: No acute infarct or hemorrhage. Lateral ventricles and midline structures are unremarkable. No acute extra-axial fluid collections. No mass effect. Vascular: No hyperdense vessel or unexpected calcification. Skull: Normal. Negative for fracture or focal lesion. Sinuses/Orbits: Chronic left maxillary sinus disease with complete opacification. Remaining paranasal sinuses are clear. Other: None. IMPRESSION: 1. Chronic left maxillary sinus disease. 2. No acute intracranial process. Electronically Signed   By: Sharlet Salina M.D.   On: 03/07/2023 22:55    Procedures Procedures    Medications Ordered in ED Medications  gabapentin (NEURONTIN) capsule 100 mg (has no administration in time  range)    ED Course/ Medical Decision Making/ A&P                                 Medical Decision Making Risk Prescription drug management.   Patient here for evaluation of several months of left sided facial and ear pain.  He is nontoxic-appearing on evaluation.  No evidence of mastoiditis, acute otitis media.  CT head is negative for acute abnormality.  Presentation is not consistent with bacterial sinusitis, odontogenic infection, temporal arteritis, space-occupying lesion.  Patient does have follow-up with neurology scheduled.  He is also followed by psychiatry, rheumatology.  Discussed with patient unclear source of symptoms, presentation does not fully fit with trigeminal neuralgia.  Will provide trial of gabapentin to see if he gets some symptomatic relief.  Discussed that he will need PCP, neurology, psychiatry follow-up for further evaluation of his ongoing symptoms.        Final Clinical Impression(s) / ED Diagnoses Final diagnoses:  Facial pain    Rx / DC Orders ED Discharge Orders          Ordered    gabapentin (NEURONTIN) 100 MG capsule  3 times daily PRN        03/08/23 0226              Tilden Fossa, MD 03/08/23 [phone]

## 2023-03-24 ENCOUNTER — Emergency Department (HOSPITAL_COMMUNITY)
Admission: EM | Admit: 2023-03-24 | Discharge: 2023-03-24 | Disposition: A | Discharge status: Home / Self Care | Payer: MEDICAID | Attending: Emergency Medicine | Admitting: Emergency Medicine

## 2023-03-24 DIAGNOSIS — I10 Essential (primary) hypertension: Secondary | ICD-10-CM | POA: Diagnosis not present

## 2023-03-24 DIAGNOSIS — L089 Local infection of the skin and subcutaneous tissue, unspecified: Secondary | ICD-10-CM | POA: Diagnosis present

## 2023-03-24 MED ORDER — MUPIROCIN CALCIUM 2 % EX CREA: 1.0000 | TOPICAL_CREAM | Freq: Three times a day (TID) | CUTANEOUS | 0 refills | Status: DC

## 2023-03-24 MED ORDER — CLINDAMYCIN HCL 150 MG PO CAPS
300.0000 mg | ORAL_CAPSULE | Freq: Once | ORAL | Status: AC
  Administered 2023-03-24: 300 mg via ORAL
  Filled 2023-03-24: qty 2

## 2023-03-24 MED ORDER — HYDROCODONE-ACETAMINOPHEN 5-325 MG PO TABS
1.0000 | ORAL_TABLET | Freq: Once | ORAL | Status: AC
Start: 2023-03-24 — End: 2023-03-24
  Administered 2023-03-24: 1 via ORAL
  Filled 2023-03-24: qty 1

## 2023-03-24 MED ORDER — ACYCLOVIR 400 MG PO TABS: 800.0000 mg | ORAL_TABLET | Freq: Every day | ORAL | 0 refills | Status: DC

## 2023-03-24 NOTE — ED Triage Notes (Signed)
 Pt c/o L sided facial swelling to his cheek area that started Thursday night into Friday morning. Pt denies known trauma to the area. Pt states that he tried draining the area without success. No dental pain.

## 2023-03-24 NOTE — Discharge Instructions (Signed)
 Take the medication as directed.  Avoid squeezing your face or sticking pins in it.  Keep your hands away from your face.  Clean with antibacterial soap and water.  Apply the ointment as directed.  Take the prescription medication for 5 days.  Please follow-up with your primary care provider or neurologist for recheck.

## 2023-03-26 ENCOUNTER — Other Ambulatory Visit: Payer: Self-pay

## 2023-03-26 ENCOUNTER — Encounter (HOSPITAL_COMMUNITY): Payer: Self-pay

## 2023-03-26 ENCOUNTER — Emergency Department (HOSPITAL_COMMUNITY): Payer: MEDICAID

## 2023-03-26 ENCOUNTER — Emergency Department (HOSPITAL_COMMUNITY)
Admission: EM | Admit: 2023-03-26 | Discharge: 2023-03-27 | Discharge status: Against Medical Advice | Payer: MEDICAID | Attending: Emergency Medicine | Admitting: Emergency Medicine

## 2023-03-26 DIAGNOSIS — R200 Anesthesia of skin: Secondary | ICD-10-CM | POA: Insufficient documentation

## 2023-03-26 DIAGNOSIS — I10 Essential (primary) hypertension: Secondary | ICD-10-CM | POA: Diagnosis not present

## 2023-03-26 DIAGNOSIS — Z79899 Other long term (current) drug therapy: Secondary | ICD-10-CM | POA: Insufficient documentation

## 2023-03-26 DIAGNOSIS — J45909 Unspecified asthma, uncomplicated: Secondary | ICD-10-CM | POA: Insufficient documentation

## 2023-03-26 LAB — RAPID URINE DRUG SCREEN, HOSP PERFORMED
Amphetamines: NOT DETECTED
Barbiturates: NOT DETECTED
Benzodiazepines: NOT DETECTED
Cocaine: NOT DETECTED
Opiates: NOT DETECTED
Tetrahydrocannabinol: NOT DETECTED

## 2023-03-26 LAB — DIFFERENTIAL
Abs Immature Granulocytes: 0.04 10*3/uL (ref 0.00–0.07)
Basophils Absolute: 0 10*3/uL (ref 0.0–0.1)
Basophils Relative: 0 %
Eosinophils Absolute: 0.2 10*3/uL (ref 0.0–0.5)
Eosinophils Relative: 2 %
Immature Granulocytes: 0 %
Lymphocytes Relative: 16 %
Lymphs Abs: 1.7 10*3/uL (ref 0.7–4.0)
Monocytes Absolute: 0.5 10*3/uL (ref 0.1–1.0)
Monocytes Relative: 4 %
Neutro Abs: 7.8 10*3/uL — ABNORMAL HIGH (ref 1.7–7.7)
Neutrophils Relative %: 78 %

## 2023-03-26 LAB — ETHANOL: Alcohol, Ethyl (B): 13 mg/dL — ABNORMAL HIGH (ref <10)

## 2023-03-26 LAB — I-STAT CHEM 8, ED
BUN: 5 mg/dL — ABNORMAL LOW (ref 6–20)
Calcium, Ion: 1.22 mmol/L (ref 1.15–1.40)
Chloride: 104 mmol/L (ref 98–111)
Creatinine, Ser: 1.1 mg/dL (ref 0.61–1.24)
Glucose, Bld: 95 mg/dL (ref 70–99)
HCT: 40 % (ref 39.0–52.0)
Hemoglobin: 13.6 g/dL (ref 13.0–17.0)
Potassium: 4 mmol/L (ref 3.5–5.1)
Sodium: 139 mmol/L (ref 135–145)
TCO2: 25 mmol/L (ref 22–32)

## 2023-03-26 LAB — CBC
HCT: 43.1 % (ref 39.0–52.0)
Hemoglobin: 14 g/dL (ref 13.0–17.0)
MCH: 24.1 pg — ABNORMAL LOW (ref 26.0–34.0)
MCHC: 32.5 g/dL (ref 30.0–36.0)
MCV: 74.1 fL — ABNORMAL LOW (ref 80.0–100.0)
Platelets: 302 10*3/uL (ref 150–400)
RBC: 5.82 MIL/uL — ABNORMAL HIGH (ref 4.22–5.81)
RDW: 15.1 % (ref 11.5–15.5)
WBC: 10.2 10*3/uL (ref 4.0–10.5)
nRBC: 0 % (ref 0.0–0.2)

## 2023-03-26 LAB — URINALYSIS, ROUTINE W REFLEX MICROSCOPIC
Bilirubin Urine: NEGATIVE
Glucose, UA: NEGATIVE mg/dL
Hgb urine dipstick: NEGATIVE
Ketones, ur: NEGATIVE mg/dL
Leukocytes,Ua: NEGATIVE
Nitrite: NEGATIVE
Protein, ur: NEGATIVE mg/dL
Specific Gravity, Urine: 1.001 — ABNORMAL LOW (ref 1.005–1.030)
pH: 6 (ref 5.0–8.0)

## 2023-03-26 LAB — COMPREHENSIVE METABOLIC PANEL
ALT: 26 U/L (ref 0–44)
AST: 21 U/L (ref 15–41)
Albumin: 4.2 g/dL (ref 3.5–5.0)
Alkaline Phosphatase: 103 U/L (ref 38–126)
Anion gap: 12 (ref 5–15)
BUN: 7 mg/dL (ref 6–20)
CO2: 24 mmol/L (ref 22–32)
Calcium: 9.4 mg/dL (ref 8.9–10.3)
Chloride: 101 mmol/L (ref 98–111)
Creatinine, Ser: 0.82 mg/dL (ref 0.61–1.24)
GFR, Estimated: >60 mL/min (ref >60)
Glucose, Bld: 100 mg/dL — ABNORMAL HIGH (ref 70–99)
Potassium: 4 mmol/L (ref 3.5–5.1)
Sodium: 137 mmol/L (ref 135–145)
Total Bilirubin: 0.4 mg/dL (ref 0.0–1.2)
Total Protein: 7.5 g/dL (ref 6.5–8.1)

## 2023-03-26 LAB — MAGNESIUM: Magnesium: 1.8 mg/dL (ref 1.7–2.4)

## 2023-03-26 MED ORDER — IOHEXOL 350 MG/ML SOLN
75.0000 mL | Freq: Once | INTRAVENOUS | Status: AC | PRN
  Administered 2023-03-26: 75 mL via INTRAVENOUS

## 2023-03-26 MED ORDER — DIAZEPAM 5 MG/ML IJ SOLN
5.0000 mg | Freq: Once | INTRAMUSCULAR | Status: AC
  Administered 2023-03-26: 5 mg via INTRAVENOUS
  Filled 2023-03-26 (×2): qty 2

## 2023-03-26 MED ORDER — LACTATED RINGERS IV BOLUS
1000.0000 mL | Freq: Once | INTRAVENOUS | Status: AC
  Administered 2023-03-26: 1000 mL via INTRAVENOUS

## 2023-03-26 NOTE — ED Notes (Signed)
 See triage notes. When touched to face/arms and legs pt states he is numb on whole left side, pressure to left side of throat/up face and behind ear x " a few months" constant per pt. Smile symmetrical. Abrasion noted to left cheek where pt states he stuck a needle in a bump on his face. C/o bump behind left ear as well x 2 days. Has had scans in past due to these symptoms. Nad. Ambulates with steady gait.

## 2023-03-26 NOTE — ED Notes (Signed)
 Patient not in room at this time.  Checked with MRI staff and patient has not been to MRI.  MRI staff reports she has "come to room to get him and he has not been there multiple times."  Patient did not identify to staff member that he was leaving.  Unable to locate patient in hospital

## 2023-03-26 NOTE — ED Provider Notes (Signed)
[place] EMERGENCY DEPARTMENT AT [facility] Provider Note   CSN: [id number] Arrival date & time: 03/26/23  1718     History  Chief Complaint  Patient presents with   Facial Pain    Herbert Walsh is a 33 y.o. male.  HPI Patient presents for multiple complaints.  Medical history includes rheumatoid arthritis, prediabetes, HTN, anemia, arthritis, asthma, depression, GERD.  He had oral surgery 4 weeks ago and was on antibiotics for the following 2 weeks.  He was seen in the ED 3 weeks ago for left-sided facial pain with numbness sensation.  He was prescribed a trial of gabapentin at the time.  He came to the ED 2 days ago for left-sided facial swelling.  He left without being seen.  He was seen by his rheumatology office today.  He states that there were delays during this appointment and this caused him some frustration.  He presents to the ED for ongoing left facial numbness which has been present for several weeks.  Over the weekend, he was picking at the left side of his face.  This was the reason for his ED visit 2 days ago.  In addition to his ongoing left facial numbness, patient feels that the left-sided neck muscles are tight to him.  He feels tingling throughout his body.  He feels that he is dehydrated.    Home Medications Prior to Admission medications   Medication Sig Start Date End Date Taking? Authorizing Provider  acyclovir (ZOVIRAX) 400 MG tablet Take 2 tablets (800 mg total) by mouth 5 (five) times daily. 03/24/23  Yes Triplett, Tammy, PA-C  allopurinol (ZYLOPRIM) 300 MG tablet Take 300 mg by mouth daily. 10/31/21  Yes [provider]  atorvastatin (LIPITOR) 10 MG tablet Take 10 mg by mouth at bedtime.  09/25/17  Yes [provider]  Cholecalciferol (VITAMIN D3) 50 MCG (2000 UT) TABS Take 1 tablet (50 mcg total) by mouth daily. 06/20/22  Yes Emokpae, Courage, MD  clindamycin (CLEOCIN) 300 MG capsule Take 300 mg by mouth every 6 (six) hours. 02/19/23   Yes [provider]  cloNIDine (CATAPRES) 0.2 MG tablet Take 1 tablet (0.2 mg total) by mouth 3 (three) times daily. 10/03/22  Yes Hisada, Barbee Cough, MD  folic acid (FOLVITE) 1 MG tablet Take 1 mg by mouth daily. 06/10/20  Yes [provider]  gabapentin (NEURONTIN) 100 MG capsule Take 1 capsule (100 mg total) by mouth 3 (three) times daily as needed. 03/08/23  Yes Tilden Fossa, MD  glipiZIDE-metformin (METAGLIP) 5-500 MG tablet Take 1 tablet by mouth daily.   Yes [provider]  HUMIRA 40 MG/0.4ML PSKT Inject 40 mg into the skin once a week.   Yes [provider]  lisinopril (PRINIVIL,ZESTRIL) 10 MG tablet Take 10 mg by mouth daily.   Yes [provider]  mupirocin cream (BACTROBAN) 2 % Apply 1 Application topically 3 (three) times daily. Apply to the affected area of your left face 03/24/23  Yes Triplett, Tammy, PA-C  naproxen (NAPROSYN) 500 MG tablet Take 500 mg by mouth 2 (two) times daily as needed for moderate pain. 09/19/22  Yes [provider]  omeprazole (PRILOSEC) 40 MG capsule Take 1 capsule by mouth every morning. 05/25/20  Yes [provider]  sertraline (ZOLOFT) 100 MG tablet Take 2 tablets (200 mg total) by mouth daily. 06/20/22 06/20/23 Yes Emokpae, Courage, MD  methotrexate (RHEUMATREX) 2.5 MG tablet Take 6 tablets by mouth once a week. 02/07/22  [provider]      Allergies    Patient has no known allergies.    Review of Systems   Review of Systems  Neurological:  Positive for numbness.  Psychiatric/Behavioral:  The patient is nervous/anxious.   All other systems reviewed and are negative.   Physical Exam Updated Vital Signs BP 122/75 (BP Location: Right Arm)   Pulse 99   Temp 98.1 F (36.7 C) (Oral)   Resp 18   Ht 5' 11.5" (1.816 m)   Wt 122.5 kg   SpO2 99%   BMI 37.13 kg/m  Physical Exam Vitals and nursing note reviewed.  Constitutional:      General: He is not in acute distress.    Appearance:  He is well-developed and normal weight. He is not toxic-appearing.  HENT:     Head: Normocephalic and atraumatic.     Comments: Nontender left postauricular lymphadenopathy.  Abrasions to left cheek.    Right Ear: External ear normal.     Left Ear: External ear normal.     Nose: Nose normal.     Mouth/Throat:     Comments: Edentulous.  No gingival swelling or erythema. Eyes:     Extraocular Movements: Extraocular movements intact.     Conjunctiva/sclera: Conjunctivae normal.  Cardiovascular:     Rate and Rhythm: Normal rate and regular rhythm.     Heart sounds: No murmur heard. Pulmonary:     Effort: Pulmonary effort is normal. No respiratory distress.  Abdominal:     General: There is no distension.     Palpations: Abdomen is soft.  Musculoskeletal:        General: No swelling. Normal range of motion.     Cervical back: Normal range of motion and neck supple.  Skin:    General: Skin is warm and dry.     Capillary Refill: Capillary refill takes less than 2 seconds.  Neurological:     Mental Status: He is alert and oriented to person, place, and time.     Sensory: Sensory deficit (Endorses diminished sensation to left side of face.) present.  Psychiatric:        Mood and Affect: Mood is anxious.        Speech: Speech is rapid and pressured.        Behavior: Behavior normal. Behavior is cooperative.     ED Results / Procedures / Treatments   Labs (all labs ordered are listed, but only abnormal results are displayed) Labs Reviewed  CBC - Abnormal; Notable for the following components:      Result Value   RBC 5.82 (*)    MCV 74.1 (*)    MCH 24.1 (*)    All other components within normal limits  DIFFERENTIAL - Abnormal; Notable for the following components:   Neutro Abs 7.8 (*)    All other components within normal limits  COMPREHENSIVE METABOLIC PANEL - Abnormal; Notable for the following components:   Glucose, Bld 100 (*)    All other components within normal limits   ETHANOL - Abnormal; Notable for the following components:   Alcohol, Ethyl (B) 13 (*)    All other components within normal limits  URINALYSIS, ROUTINE W REFLEX MICROSCOPIC - Abnormal; Notable for the following components:   Color, Urine COLORLESS (*)    Specific Gravity, Urine 1.001 (*)    All other components within normal limits  I-STAT CHEM 8, ED - Abnormal; Notable for the following components:   BUN 5 (*)  All other components within normal limits  RAPID URINE DRUG SCREEN, HOSP PERFORMED  MAGNESIUM    EKG EKG Interpretation Date/Time:  Monday March 26 2023 17:35:25 EST Ventricular Rate:  126 PR Interval:  148 QRS Duration:  98 QT Interval:  312 QTC Calculation: 451 R Axis:   89  Text Interpretation: Sinus tachycardia Incomplete right bundle branch block Confirmed by Gloris Manchester [phone]) on 03/26/2023 5:38:16 PM  Radiology CT ANGIO HEAD NECK W WO CM Result Date: 03/26/2023 CLINICAL DATA:  Neuro deficit, acute, stroke suspected. Left facial numbness, burning and pain. EXAM: CT ANGIOGRAPHY HEAD AND NECK WITH AND WITHOUT CONTRAST TECHNIQUE: Multidetector CT imaging of the head and neck was performed using the standard protocol during bolus administration of intravenous contrast. Multiplanar CT image reconstructions and MIPs were obtained to evaluate the vascular anatomy. Carotid stenosis measurements (when applicable) are obtained utilizing NASCET criteria, using the distal internal carotid diameter as the denominator. RADIATION DOSE REDUCTION: This exam was performed according to the departmental dose-optimization program which includes automated exposure control, adjustment of the mA and/or kV according to patient size and/or use of iterative reconstruction technique. CONTRAST:  75mL OMNIPAQUE IOHEXOL 350 MG/ML SOLN COMPARISON:  Head CT 03/07/2023.  MRI 06/20/2022. FINDINGS: CT HEAD FINDINGS Brain: The brain shows a normal appearance without evidence of malformation, atrophy, old  or acute small or large vessel infarction, mass lesion, hemorrhage, hydrocephalus or extra-axial collection. Vascular: No hyperdense vessel. No evidence of atherosclerotic calcification. Skull: Normal.  No traumatic finding.  No focal bone lesion. 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 Not visibly changed from the prior studies. Question silent sinus syndrome. Orbits appear normal. Mastoids are clear. Other: None significant CTA NECK FINDINGS Aortic arch: Normal Right carotid system: Common carotid artery widely patent to the bifurcation. Normal carotid bifurcation. Normal cervical ICA. Left carotid system: Common carotid artery widely patent to the bifurcation. Normal bifurcation. Normal ICA. Vertebral arteries: Both vertebral artery origins widely patent and normal. Both vertebral arteries normal through the cervical region to the foramen magnum. Skeleton: Normal Other neck: Normal Upper chest: Normal Review of the MIP images confirms the above findings CTA HEAD FINDINGS Anterior circulation: Both internal carotid arteries are widely patent through the skull base and siphon regions. The anterior and middle cerebral vessels are normal. No occlusion or stenosis. No aneurysm or vascular malformation. Posterior circulation: Both vertebral arteries widely patent through the foramen magnum to the basilar artery. No basilar stenosis. Posterior circulation branch vessels are. Venous sinuses: Patent and normal. Anatomic variants: None significant. Review of the MIP images confirms the above findings IMPRESSION: 1. Normal head CT. 2. Normal CTA of the head and neck. 3. 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 Question silent sinus syndrome. Electronically Signed   By: Paulina Fusi M.D.   On: 03/26/2023 20:13    Procedures Procedures    Medications Ordered in ED Medications  lactated ringers bolus 1,000 mL (0 mLs Intravenous Stopped 03/26/23 1936)   diazepam (VALIUM) injection 5 mg (5 mg Intravenous Given 03/26/23 1826)  iohexol (OMNIPAQUE) 350 MG/ML injection 75 mL (75 mLs Intravenous Contrast Given 03/26/23 1938)    ED Course/ Medical Decision Making/ A&P                                 Medical Decision Making Amount and/or Complexity of Data Reviewed Labs: ordered. Radiology: ordered.  Risk Prescription  drug management.   This patient presents to the ED for concern of left facial numbness, this involves an extensive number of treatment options, and is a complaint that carries with it a high risk of complications and morbidity.  The differential diagnosis includes CVA, cavernous sinus thrombosis, intoxication, metabolic derangements   Co morbidities that complicate the patient evaluation  rheumatoid arthritis, prediabetes, HTN, anemia, arthritis, asthma, depression, GERD   Additional history obtained:  Additional history obtained from N/A External records from outside source obtained and reviewed including EMR   Lab Tests:  I Ordered, and personally interpreted labs.  The pertinent results include: Normal kidney function, normal electrolytes, normal hemoglobin, no leukocytosis, negative UDS   Imaging Studies ordered:  I ordered imaging studies including CTA head and neck, MR MRV of head I independently visualized and interpreted imaging which showed CT showed chronic opacification and volume loss of left maxillary sinus.  Patient eloped prior to MRV I agree with the radiologist interpretation   Cardiac Monitoring: / EKG:  The patient was maintained on a cardiac monitor.  I personally viewed and interpreted the cardiac monitored which showed an underlying rhythm of: Sinus rhythm   Problem List / ED Course / Critical interventions / Medication management  Patient presenting for left facial numbness which has been present over the past several weeks.  He was seen in the ED previously for the same.  On arrival,  patient is hypertensive and tachycardic.  On exam, he does have rapid and pressured speech.  He has an abrasion to his left cheek which was caused by him picking at it over the weekend.  He endorses diminished sensation throughout left side of face and scalp.  Per chart review, he underwent noncontrast CT of head 3 weeks ago which showed chronic left maxillary sinus disease without any acute findings.  Patient seems to perseverate on his suspicion of blocked arteries in the left side of his face.  Patient's lab work was unremarkable.  On CT scan, he does have a chronic opacification of left maxillary sinus.  This in addition to his symptoms of left facial numbness do raise concern for cavernous sinus thrombosis.  MRV was ordered.  Patient eloped prior to the study. I ordered medication including IV fluids and Valium for tachycardia Reevaluation of the patient after these medicines showed that the patient improved I have reviewed the patients home medicines and have made adjustments as needed   Social Determinants of Health:  Frequent ED visits        Final Clinical Impression(s) / ED Diagnoses Final diagnoses:  Left facial numbness    Rx / DC Orders ED Discharge Orders     None         Gloris Manchester, MD 03/27/23 [phone]

## 2023-03-26 NOTE — ED Triage Notes (Signed)
 Pt arrived via POV c/o left facial numbness, burning and pain. Pt seen here recently for same and reports he was seen by his rheumatologist earlier today, but the facial pain was not present at his appointment earlier today. Pt reports he then went to an Urgent Care but was told he would need to come to the ER for further evaluation. Pt hypertensive, and tachycardic in Triage.

## 2023-03-26 NOTE — ED Provider Notes (Signed)
 [facility] AT [facility] Provider Note   CSN: [id number] Arrival date & time: 03/24/23  1721     History  Chief Complaint  Patient presents with   Facial Swelling    Herbert Walsh is a 33 y.o. male.  HPI      Herbert Walsh is a 33 y.o. male past medical history of hypertension, anemia, PTSD, MSSA, colitis, and substance use disorder who presents to the Emergency Department complaining of pain and swelling of his left cheek.  States symptoms began 2 days ago.  He denies any injury to the area.  He reports having some numbness and tingling to the area.  He has pain radiating into his temple, left scalp and toward his left ear.  He has been squeezing his face and attempts to express pus from the area.  Denies any dental pain, fever, difficulty swallowing or swelling of his neck.  Endorses history of multiple antibiotic use due to his MSSA and he has had previous osteomyelitis.     Home Medications Prior to Admission medications   Medication Sig Start Date End Date Taking? Authorizing Provider  acyclovir  (ZOVIRAX ) 400 MG tablet Take 2 tablets (800 mg total) by mouth 5 (five) times daily. 03/24/23  Yes Kinslea Frances, PA-C  mupirocin  cream (BACTROBAN ) 2 % Apply 1 Application topically 3 (three) times daily. Apply to the affected area of your left face 03/24/23  Yes Colyn Miron, PA-C  allopurinol (ZYLOPRIM) 300 MG tablet Take 300 mg by mouth daily. 10/31/21   [provider]  atorvastatin  (LIPITOR) 10 MG tablet Take 10 mg by mouth at bedtime.  09/25/17   [provider]  Cholecalciferol  (VITAMIN D3) 50 MCG (2000 UT) TABS Take 1 tablet (50 mcg total) by mouth daily. 06/20/22   Pearlean Manus, MD  clindamycin  (CLEOCIN ) 300 MG capsule Take 300 mg by mouth every 6 (six) hours. 02/19/23   [provider]  cloNIDine  (CATAPRES ) 0.2 MG tablet Take 1 tablet (0.2 mg total) by mouth 3 (three) times daily. 10/03/22   Vickey Mettle, MD   folic acid (FOLVITE) 1 MG tablet Take 1 mg by mouth daily. 06/10/20   [provider]  gabapentin  (NEURONTIN ) 100 MG capsule Take 1 capsule (100 mg total) by mouth 3 (three) times daily as needed. 03/08/23   Griselda Norris, MD  glipiZIDE -metformin  (METAGLIP) 5-500 MG tablet Take 1 tablet by mouth daily.    [provider]  HUMIRA 40 MG/0.4ML PSKT Inject 40 mg into the skin once a week.    [provider]  lisinopril  (PRINIVIL ,ZESTRIL ) 10 MG tablet Take 10 mg by mouth daily.    [provider]  methotrexate (RHEUMATREX) 2.5 MG tablet Take 6 tablets by mouth once a week. 02/07/22   [provider]  naproxen  (NAPROSYN ) 500 MG tablet Take 500 mg by mouth 2 (two) times daily as needed for moderate pain. 09/19/22   [provider]  omeprazole (PRILOSEC) 40 MG capsule Take 1 capsule by mouth every morning. 05/25/20   [provider]  sertraline  (ZOLOFT ) 100 MG tablet Take 2 tablets (200 mg total) by mouth daily. 06/20/22 06/20/23  Pearlean Manus, MD      Allergies    Patient has no known allergies.    Review of Systems   Review of Systems  Constitutional:  Negative for chills and fever.  HENT:  Negative for dental problem and facial swelling.        Rash swelling tenderness left  cheek area  Eyes:  Negative for pain and visual disturbance.  Respiratory:  Negative for shortness of breath.   Cardiovascular:  Negative for chest pain.  Gastrointestinal:  Negative for abdominal pain, nausea and vomiting.  Neurological:  Negative for dizziness, speech difficulty, weakness and headaches.  Psychiatric/Behavioral:  Negative for confusion.     Physical Exam Updated Vital Signs BP 114/76 (BP Location: Right Arm)   Pulse 91   Temp 98.7 F (37.1 C) (Oral)   Resp 16   SpO2 100%  Physical Exam Vitals and nursing note reviewed.  Constitutional:      General: He is not in acute distress.    Appearance: Normal appearance. He is not  toxic-appearing.  HENT:     Head:      Comments: 2 crusted areas left cheek.  No significant edema or erythema or drainage noted.  No trismus, no tenderness of the mandible or TM joint.    Right Ear: Tympanic membrane and ear canal normal.     Left Ear: Tympanic membrane and ear canal normal.     Mouth/Throat:     Mouth: Mucous membranes are moist.     Pharynx: Oropharynx is clear.     Comments: Left mouth edentulous Eyes:     Extraocular Movements: Extraocular movements intact.     Conjunctiva/sclera: Conjunctivae normal.     Pupils: Pupils are equal, round, and reactive to light.  Cardiovascular:     Rate and Rhythm: Normal rate and regular rhythm.     Pulses: Normal pulses.  Pulmonary:     Effort: Pulmonary effort is normal.  Musculoskeletal:        General: Normal range of motion.     Cervical back: Normal range of motion. No rigidity.  Lymphadenopathy:     Cervical: No cervical adenopathy.  Skin:    General: Skin is warm.     Capillary Refill: Capillary refill takes less than 2 seconds.  Neurological:     General: No focal deficit present.     Mental Status: He is alert.     Cranial Nerves: Cranial nerves 2-12 are intact.     Sensory: No sensory deficit.     Motor: No weakness.     Comments: CN II through XII grossly intact.  Speech clear.  No facial weakness.  Patient ambulatory steady gait.     ED Results / Procedures / Treatments   Labs (all labs ordered are listed, but only abnormal results are displayed) Labs Reviewed - No data to display  EKG None  Radiology CT ANGIO HEAD NECK W WO CM Result Date: 03/26/2023 CLINICAL DATA:  Neuro deficit, acute, stroke suspected. Left facial numbness, burning and pain. EXAM: CT ANGIOGRAPHY HEAD AND NECK WITH AND WITHOUT CONTRAST TECHNIQUE: Multidetector CT imaging of the head and neck was performed using the standard protocol during bolus administration of intravenous contrast. Multiplanar CT image reconstructions and MIPs  were obtained to evaluate the vascular anatomy. Carotid stenosis measurements (when applicable) are obtained utilizing NASCET criteria, using the distal internal carotid diameter as the denominator. RADIATION DOSE REDUCTION: This exam was performed according to the departmental dose-optimization program which includes automated exposure control, adjustment of the mA and/or kV according to patient size and/or use of iterative reconstruction technique. CONTRAST:  75mL OMNIPAQUE  IOHEXOL  350 MG/ML SOLN COMPARISON:  Head CT 03/07/2023.  MRI 06/20/2022. FINDINGS: CT HEAD FINDINGS Brain: The brain shows a normal appearance without evidence of malformation, atrophy, old or acute small or large vessel infarction,  mass lesion, hemorrhage, hydrocephalus or extra-axial collection. Vascular: No hyperdense vessel. No evidence of atherosclerotic calcification. Skull: Normal.  No traumatic finding.  No focal bone lesion. 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 Not visibly changed from the prior studies. Question silent sinus syndrome. Orbits appear normal. Mastoids are clear. Other: None significant CTA NECK FINDINGS Aortic arch: Normal Right carotid system: Common carotid artery widely patent to the bifurcation. Normal carotid bifurcation. Normal cervical ICA. Left carotid system: Common carotid artery widely patent to the bifurcation. Normal bifurcation. Normal ICA. Vertebral arteries: Both vertebral artery origins widely patent and normal. Both vertebral arteries normal through the cervical region to the foramen magnum. Skeleton: Normal Other neck: Normal Upper chest: Normal Review of the MIP images confirms the above findings CTA HEAD FINDINGS Anterior circulation: Both internal carotid arteries are widely patent through the skull base and siphon regions. The anterior and middle cerebral vessels are normal. No occlusion or stenosis. No aneurysm or vascular malformation. Posterior  circulation: Both vertebral arteries widely patent through the foramen magnum to the basilar artery. No basilar stenosis. Posterior circulation branch vessels are. Venous sinuses: Patent and normal. Anatomic variants: None significant. Review of the MIP images confirms the above findings IMPRESSION: 1. Normal head CT. 2. Normal CTA of the head and neck. 3. 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 Question silent sinus syndrome. Electronically Signed   By: Oneil Officer M.D.   On: 03/26/2023 20:13    Procedures Procedures    Medications Ordered in ED Medications  clindamycin  (CLEOCIN ) capsule 300 mg (300 mg Oral Given 03/24/23 1818)  HYDROcodone -acetaminophen  (NORCO/VICODIN) 5-325 MG per tablet 1 tablet (1 tablet Oral Given 03/24/23 1818)    ED Course/ Medical Decision Making/ A&P                                 Medical Decision Making   Patient here for evaluation of reported facial swelling of his left cheek with rash.  States symptoms began 2 days ago.  Endorses some paresthesias of the left upper face as well.  His exam is without evidence of facial weakness, no speech deficits.  I do not appreciate any swelling or fluctuance of the face.  No oral lesions or swelling of the submandibular area.  Handle secretions well.  Vital signs are reassuring.  On review of medical records, patient was noted to have been seen at ER 03/07/2023 for similar symptoms.  Patient initially did not report being previously evaluated for this  Amount and/or Complexity of Data Reviewed Discussion of management or test interpretation with external provider(s):   2 scabbed areas of the left cheek area without fluctuance.  I do not appreciate any facial weakness on exam.  Speech is clear.  Symptoms concerning for possible shingles.  No involvement of the eye.  I do not appreciate any lesions of the scalp.    Will treat with acyclovir  and Bactroban .  He appears appropriate for discharge  home, he will follow-up closely outpatient with PCP.  Return precautions were given.  Risk Prescription drug management.           Final Clinical Impression(s) / ED Diagnoses Final diagnoses:  Local skin infection    Rx / DC Orders ED Discharge Orders          Ordered    mupirocin  cream (BACTROBAN ) 2 %  3 times daily  03/24/23 1825    acyclovir  (ZOVIRAX ) 400 MG tablet  5 times daily        03/24/23 1825              Herlinda Milling, PA-C 03/26/23 2035    Elnor Savant A, DO 03/27/23 [phone]

## 2023-03-27 NOTE — ED Notes (Signed)
Patient eloped from department.  Did not identify to staff member that he was leaving.  Multiple attempts to locate patient were made without success.  Charge RN aware

## 2023-03-27 NOTE — ED Notes (Signed)
Attempted to call patient at number provided to check on status of IV.  No answer from number listed in chart.  Charge RN aware.

## 2023-04-03 ENCOUNTER — Encounter (HOSPITAL_COMMUNITY): Payer: Self-pay

## 2023-04-03 ENCOUNTER — Emergency Department (HOSPITAL_COMMUNITY)
Admission: EM | Admit: 2023-04-03 | Discharge: 2023-04-03 | Discharge status: Against Medical Advice | Payer: MEDICAID | Attending: Emergency Medicine | Admitting: Emergency Medicine

## 2023-04-03 ENCOUNTER — Other Ambulatory Visit: Payer: Self-pay

## 2023-04-03 DIAGNOSIS — Z5321 Procedure and treatment not carried out due to patient leaving prior to being seen by health care provider: Secondary | ICD-10-CM | POA: Diagnosis not present

## 2023-04-03 DIAGNOSIS — R519 Headache, unspecified: Secondary | ICD-10-CM | POA: Insufficient documentation

## 2023-04-03 DIAGNOSIS — R200 Anesthesia of skin: Secondary | ICD-10-CM | POA: Insufficient documentation

## 2023-04-03 NOTE — ED Triage Notes (Signed)
Pt arrived via POV c/o left sided numbness and head pain. Seen here on the 10th for same. Pt states he was supposed to have MRI but left AMA. Pt states that it began happening again today around 2 pm.

## 2023-04-03 NOTE — ED Notes (Signed)
 Pt seen leaving ED

## 2023-04-15 ENCOUNTER — Encounter (HOSPITAL_COMMUNITY): Payer: Self-pay | Admitting: Emergency Medicine

## 2023-04-15 ENCOUNTER — Other Ambulatory Visit: Payer: Self-pay

## 2023-04-15 ENCOUNTER — Emergency Department (HOSPITAL_COMMUNITY)
Admission: EM | Admit: 2023-04-15 | Discharge: 2023-04-16 | Disposition: A | Discharge status: Home / Self Care | Payer: MEDICAID | Attending: Student | Admitting: Student

## 2023-04-15 DIAGNOSIS — J320 Chronic maxillary sinusitis: Secondary | ICD-10-CM | POA: Insufficient documentation

## 2023-04-15 DIAGNOSIS — R202 Paresthesia of skin: Secondary | ICD-10-CM

## 2023-04-15 DIAGNOSIS — J45909 Unspecified asthma, uncomplicated: Secondary | ICD-10-CM | POA: Insufficient documentation

## 2023-04-15 DIAGNOSIS — I10 Essential (primary) hypertension: Secondary | ICD-10-CM | POA: Diagnosis not present

## 2023-04-15 DIAGNOSIS — F17210 Nicotine dependence, cigarettes, uncomplicated: Secondary | ICD-10-CM | POA: Insufficient documentation

## 2023-04-15 DIAGNOSIS — Z8616 Personal history of COVID-19: Secondary | ICD-10-CM | POA: Insufficient documentation

## 2023-04-15 DIAGNOSIS — R200 Anesthesia of skin: Secondary | ICD-10-CM | POA: Diagnosis not present

## 2023-04-15 DIAGNOSIS — R519 Headache, unspecified: Secondary | ICD-10-CM | POA: Diagnosis present

## 2023-04-15 DIAGNOSIS — J329 Chronic sinusitis, unspecified: Secondary | ICD-10-CM

## 2023-04-15 MED ORDER — PROCHLORPERAZINE EDISYLATE 10 MG/2ML IJ SOLN
10.0000 mg | Freq: Once | INTRAMUSCULAR | Status: AC
  Administered 2023-04-16: 10 mg via INTRAVENOUS
  Filled 2023-04-15: qty 2

## 2023-04-15 MED ORDER — DIPHENHYDRAMINE HCL 50 MG/ML IJ SOLN
25.0000 mg | Freq: Once | INTRAMUSCULAR | Status: AC
  Administered 2023-04-16: 25 mg via INTRAVENOUS
  Filled 2023-04-15: qty 1

## 2023-04-15 NOTE — ED Triage Notes (Signed)
 Pt c/o left sided numbness for months that is getting worse.

## 2023-04-16 ENCOUNTER — Emergency Department (HOSPITAL_COMMUNITY): Payer: MEDICAID

## 2023-04-16 LAB — I-STAT CHEM 8, ED
BUN: 14 mg/dL (ref 6–20)
Calcium, Ion: 1.02 mmol/L — ABNORMAL LOW (ref 1.15–1.40)
Chloride: 107 mmol/L (ref 98–111)
Creatinine, Ser: 1.1 mg/dL (ref 0.61–1.24)
Glucose, Bld: 131 mg/dL — ABNORMAL HIGH (ref 70–99)
HCT: 42 % (ref 39.0–52.0)
Hemoglobin: 14.3 g/dL (ref 13.0–17.0)
Potassium: 4.3 mmol/L (ref 3.5–5.1)
Sodium: 137 mmol/L (ref 135–145)
TCO2: 24 mmol/L (ref 22–32)

## 2023-04-16 MED ORDER — LORAZEPAM 2 MG/ML IJ SOLN
1.0000 mg | Freq: Once | INTRAMUSCULAR | Status: AC
  Administered 2023-04-16: 1 mg via INTRAVENOUS
  Filled 2023-04-16: qty 1

## 2023-04-16 MED ORDER — AMOXICILLIN-POT CLAVULANATE 875-125 MG PO TABS
1.0000 | ORAL_TABLET | Freq: Once | ORAL | Status: AC
  Administered 2023-04-16: 1 via ORAL
  Filled 2023-04-16: qty 1

## 2023-04-16 MED ORDER — KETOROLAC TROMETHAMINE 15 MG/ML IJ SOLN
15.0000 mg | Freq: Once | INTRAMUSCULAR | Status: AC
  Administered 2023-04-16: 15 mg via INTRAVENOUS
  Filled 2023-04-16: qty 1

## 2023-04-16 MED ORDER — IOHEXOL 350 MG/ML SOLN
75.0000 mL | Freq: Once | INTRAVENOUS | Status: AC | PRN
  Administered 2023-04-16: 75 mL via INTRAVENOUS

## 2023-04-16 MED ORDER — AMOXICILLIN-POT CLAVULANATE 875-125 MG PO TABS: 1.0000 | ORAL_TABLET | Freq: Two times a day (BID) | ORAL | 0 refills | Status: DC

## 2023-04-16 NOTE — ED Provider Notes (Signed)
 [place] EMERGENCY DEPARTMENT AT [facility] Provider Note  CSN: [id number] Arrival date & time: 04/15/23 2317  Chief Complaint(s) Numbness  HPI Herbert Walsh is a 33 y.o. male with PMH rheumatoid arthritis, MRSA, osteoarthritis, substance use disorder, PTSD who presents emergency department for evaluation of facial numbness and headache.  Patient has been seen multiple times over the last year for the same complaint and has had multiple CT angiography studies as well as a negative brain MRI.  Most recent imaging performed on 03/26/2023 that was negative for vascular issues but did show some sinus disease.  Patient left prior to MRV being performed as he states he had family issues at home.    Past Medical History Past Medical History:  Diagnosis Date   Anemia    Arthritis    rheumatoid   Asthma    Depression    GERD (gastroesophageal reflux disease)    History of COVID-19 03/29/2020   Hypertension 2019   Ischemic colitis (HCC) 09/2008   MSSA (methicillin susceptible Staphylococcus aureus)    sternaoclavicular absecess drainage   Osteoarthritis    sterum and right shoulder   PTSD (post-traumatic stress disorder)    Substance use disorder    Tachycardia    Patient Active Problem List   Diagnosis Date Noted   Facial numbness 06/19/2022   Acute upper respiratory infection 10/06/2020   Adult-onset obesity 10/06/2020   Carbuncle and furuncle of buttock 10/06/2020   Contusion of knee 10/06/2020   Myopia 10/06/2020   Pharyngitis 10/06/2020   Screening examination for pulmonary tuberculosis 10/06/2020   Health examination of defined subpopulation 10/06/2020   Fitting and adjustment of spectacles and contact lenses 10/06/2020   COVID-19 01/22/2020   Nonhealing surgical wound 12/17/2019   Shoulder pain, right 11/19/2019   Encounter for surgical wound dressing change 09/29/2019   Encounter for postoperative wound check 09/24/2019   Change or removal of surgical wound  dressing 09/03/2019   Draining cutaneous sinus tract 08/27/2019   Open wound of chest wall with complication, right, sequela 08/20/2019   Wound check, abscess 08/06/2019   Neuropathic pain of chest 07/23/2019   Wound with infection determined by examination 07/17/2019   Open wound of chest wall 07/09/2019   Abscess of parasternal chest wall 07/02/2019   Osteomyelitis (HCC) 06/20/2019   MSSA (methicillin susceptible Staphylococcus aureus) infection 06/20/2019   Sternoclavicular (joint) (ligament) sprain 06/18/2019   Sternal osteomyelitis (HCC) 06/18/2019   Septic arthritis of knee, right (HCC) 05/31/2019   Myofascitis 05/31/2019   Prediabetes 05/31/2019   MSSA bacteremia 05/31/2019   Septic arthritis of right sternoclavicular joint (HCC) 05/30/2019   Rheumatoid arthritis (HCC) 05/30/2019   Glucosuria 05/30/2019   Tobacco use 05/05/2019   Use of energy drinks 05/05/2019   H. pylori infection 04/04/2019   Hyperplastic polyp of transverse colon 04/04/2019   Hyperplastic rectal polyp 04/04/2019   Intermittent explosive 03/19/2019   Difficulty controlling anger 03/19/2019   Colitis 03/11/2019   Gastroesophageal reflux disease 03/11/2019   NSAID long-term use 03/11/2019   Anemia 01/27/2019   Granulocytosis 01/27/2019   PTSD (post-traumatic stress disorder) 11/01/2017   Home Medication(s) Prior to Admission medications   Medication Sig Start Date End Date Taking? Authorizing Provider  acyclovir (ZOVIRAX) 400 MG tablet Take 2 tablets (800 mg total) by mouth 5 (five) times daily. 03/24/23   Triplett, Tammy, PA-C  allopurinol (ZYLOPRIM) 300 MG tablet Take 300 mg by mouth daily. 10/31/21   [provider]  atorvastatin (LIPITOR) 10 MG  tablet Take 10 mg by mouth at bedtime.  09/25/17   [provider]  Cholecalciferol (VITAMIN D3) 50 MCG (2000 UT) TABS Take 1 tablet (50 mcg total) by mouth daily. 06/20/22   Shon Hale, MD  clindamycin (CLEOCIN) 300 MG capsule Take 300 mg  by mouth every 6 (six) hours. 02/19/23   [provider]  cloNIDine (CATAPRES) 0.2 MG tablet Take 1 tablet (0.2 mg total) by mouth 3 (three) times daily. 10/03/22   Neysa Hotter, MD  folic acid (FOLVITE) 1 MG tablet Take 1 mg by mouth daily. 06/10/20   [provider]  gabapentin (NEURONTIN) 100 MG capsule Take 1 capsule (100 mg total) by mouth 3 (three) times daily as needed. 03/08/23   Tilden Fossa, MD  glipiZIDE-metformin (METAGLIP) 5-500 MG tablet Take 1 tablet by mouth daily.    [provider]  HUMIRA 40 MG/0.4ML PSKT Inject 40 mg into the skin once a week.    [provider]  lisinopril (PRINIVIL,ZESTRIL) 10 MG tablet Take 10 mg by mouth daily.    [provider]  methotrexate (RHEUMATREX) 2.5 MG tablet Take 6 tablets by mouth once a week. 02/07/22   [provider]  mupirocin cream (BACTROBAN) 2 % Apply 1 Application topically 3 (three) times daily. Apply to the affected area of your left face 03/24/23   Triplett, Tammy, PA-C  naproxen (NAPROSYN) 500 MG tablet Take 500 mg by mouth 2 (two) times daily as needed for moderate pain. 09/19/22   [provider]  omeprazole (PRILOSEC) 40 MG capsule Take 1 capsule by mouth every morning. 05/25/20   [provider]  sertraline (ZOLOFT) 100 MG tablet Take 2 tablets (200 mg total) by mouth daily. 06/20/22 06/20/23  Shon Hale, MD                                                                                                                                    Past Surgical History Past Surgical History:  Procedure Laterality Date   APPENDECTOMY     APPLICATION OF WOUND VAC Right 06/20/2019   Procedure: APPLICATION OF WOUND VAC;  Surgeon: [name], MD;  Location: [facility] OR;  Service: Vascular;  Laterality: Right;   APPLICATION OF WOUND VAC Right 06/23/2019   Procedure: APPLICATION OF WOUND VAC;  Surgeon: [name], MD;  Location: [facility] OR;  Service: Thoracic;  Laterality: Right;    COLONOSCOPY     x 2   I & D EXTREMITY Right 05/31/2019   Procedure: IRRIGATION AND DEBRIDEMENT LOWER EXTREMITY;  Surgeon: Tarry Kos, MD;  Location: [facility] OR;  Service: Orthopedics;  Laterality: Right;   I & D EXTREMITY Right 06/20/2019   Procedure: DEBRIDEMENT OF STERNOCLAVICULAR JOINT ABSCESS;  Surgeon: [name], MD;  Location: [facility] OR;  Service: Vascular;  Laterality: Right;   INCISION AND DRAINAGE OF WOUND Right 04/12/2020   Procedure: excision of right sternoclavicular joint  infection;  Surgeon: Peggye Form, DO;  Location: [facility];  Service: Plastics;  Laterality: Right;  45 min total   STERNAL WOUND DEBRIDEMENT Right 09/19/2019   Procedure: EXCISIONAL DEBRIDEMENT AND IRRIGATION OF RIGHT STERNOCLAVICULAR JOINT WOUND;  Surgeon: [name], MD;  Location: [facility] OR;  Service: Thoracic;  Laterality: Right;   TEE WITHOUT CARDIOVERSION N/A 06/05/2019   Procedure: TRANSESOPHAGEAL ECHOCARDIOGRAM (TEE);  Surgeon: Lewayne Bunting, MD;  Location: [facility] ENDOSCOPY;  Service: Cardiovascular;  Laterality: N/A;   WOUND EXPLORATION Right 06/23/2019   Procedure: irrigation and clean out right shoulder;  Surgeon: [name], MD;  Location: [facility] OR;  Service: Thoracic;  Laterality: Right;   Family History Family History  Problem Relation Age of Onset   Alcohol abuse Mother    Depression Mother    Drug abuse Mother    Physical abuse Mother    Sexual abuse Mother    ADD / ADHD Paternal Aunt    Alcohol abuse Maternal Grandfather    Bipolar disorder Maternal Grandmother    Schizophrenia Maternal Grandmother    Drug abuse Maternal Grandmother    Alcohol abuse Paternal Grandfather    Depression Paternal Grandfather    Depression Paternal Grandmother    Drug abuse Paternal Grandmother     Social History Social History   Tobacco Use   Smoking status: Every Day    Current packs/day: 1.00    Average packs/day: 1 pack/day for 15.0 years (15.0 ttl pk-yrs)    Types:  Cigarettes   Smokeless tobacco: Never  Vaping Use   Vaping status: Never Used  Substance Use Topics   Alcohol use: Not Currently    Comment: rare beer   Drug use: Not Currently    Types: Marijuana    Comment: cough med 24 pills daily   Allergies Patient has no known allergies.  Review of Systems Review of Systems  Neurological:  Positive for numbness.    Physical Exam Vital Signs  I have reviewed the triage vital signs BP (!) 170/107   Pulse (!) 122   Temp 98.6 F (37 C) (Oral)   Resp 17   Ht 5' 11.5" (1.816 m)   Wt 122.5 kg   SpO2 96%   BMI 37.14 kg/m   Physical Exam Constitutional:      General: He is not in acute distress.    Appearance: Normal appearance.  HENT:     Head: Normocephalic and atraumatic.     Nose: No congestion or rhinorrhea.  Eyes:     General:        Right eye: No discharge.        Left eye: No discharge.     Extraocular Movements: Extraocular movements intact.     Pupils: Pupils are equal, round, and reactive to light.  Cardiovascular:     Rate and Rhythm: Normal rate and regular rhythm.     Heart sounds: No murmur heard. Pulmonary:     Effort: No respiratory distress.     Breath sounds: No wheezing or rales.  Abdominal:     General: There is no distension.     Tenderness: There is no abdominal tenderness.  Musculoskeletal:        General: Normal range of motion.     Cervical back: Normal range of motion.  Skin:    General: Skin is warm and dry.  Neurological:     General: No focal deficit present.     Mental Status: He is alert.  Sensory: Sensory deficit present.     ED Results and Treatments Labs (all labs ordered are listed, but only abnormal results are displayed) Labs Reviewed  I-STAT CHEM 8, ED                                                                                                                          Radiology No results found.  Pertinent labs & imaging results that were available during my care of  the patient were reviewed by me and considered in my medical decision making (see MDM for details).  Medications Ordered in ED Medications  prochlorperazine (COMPAZINE) injection 10 mg (has no administration in time range)  diphenhydrAMINE (BENADRYL) injection 25 mg (has no administration in time range)                                                                                                                                     Procedures Procedures  (including critical care time)  Medical Decision Making / ED Course   This patient presents to the ED for concern of facial numbness, this involves an extensive number of treatment options, and is a complaint that carries with it a high risk of complications and morbidity.  The differential diagnosis includes Bell's palsy, atypical migraine, cavernous sinus thrombosis, dural venous sinus thrombosis, CVA, anxiety  MDM: Patient seen emergency room for evaluation of facial numbness.  Physical exam with a reported sensory deficit in the V1 and V2 distribution on the left but neurologic exam otherwise unremarkable with no additional focal motor or sensory deficits, no additional cranial nerve deficits.  Laboratory evaluation largely unremarkable.  CT venogram negative for dural venous sinus thrombosis, CT maxillofacial with hypoplastic and small left maxillary sinus with chronic opacification suggesting silent sinus syndrome, 4 mm right to left nasal septal deviation with associated spur.  Patient states he did suffer facial trauma in the past which may be the underlying source of this.  We tried a headache cocktail without significant treatment of the patient's symptoms but his vital signs did significant proved.  On my reevaluation, his heart rate has improved to 105 down from 122 at presentation and blood pressures in the 150s down from 170s on arrival.  We will trial a short course of Augmentin for the patient sinus disease.  He does have  scheduled follow-up with [facility] neurology and I think this would be  of benefit to the patient.  He is asking for pain medicine multiple times throughout this visit despite stating that he is not actually in pain he just has numbness and with his previous history of substance abuse do wonder if there is an element of secondary gain here but I do not have true evidence of this and he should have a thorough neurologic evaluation in the outpatient setting.  He has had extensive imaging of the head including multiple CTs, CT angiography and MRI brain and I explained the patient he is likely maximized ER interventions for the specific complaint but if neurologic complaints change she is welcome to return to the emergency department for further evaluation.  At this time he does not meet inpatient criteria for admission and will be discharged with outpatient neurologic follow-up   Additional history obtained:  -External records from outside source obtained and reviewed including: Chart review including previous notes, labs, imaging, consultation notes   Lab Tests: -I ordered, reviewed, and interpreted labs.   The pertinent results include:   Labs Reviewed  I-STAT CHEM 8, ED      EKG   EKG Interpretation Date/Time:  Sunday April 15 2023 23:32:15 EST Ventricular Rate:  111 PR Interval:  143 QRS Duration:  109 QT Interval:  359 QTC Calculation: 488 R Axis:   99  Text Interpretation: Sinus tachycardia Confirmed by Melayna Robarts ([id number]) on 04/16/2023 12:05:51 AM         Imaging Studies ordered: I ordered imaging studies including CT venogram, CT max face I independently visualized and interpreted imaging. I agree with the radiologist interpretation   Medicines ordered and prescription drug management: Meds ordered this encounter  Medications   prochlorperazine (COMPAZINE) injection 10 mg   diphenhydrAMINE (BENADRYL) injection 25 mg    -I have reviewed the patients home medicines and  have made adjustments as needed  Critical interventions none    Cardiac Monitoring: The patient was maintained on a cardiac monitor.  I personally viewed and interpreted the cardiac monitored which showed an underlying rhythm of: NSR, sinus tachycardia  Social Determinants of Health:  Factors impacting patients care include: none   Reevaluation: After the interventions noted above, I reevaluated the patient and found that they have :stayed the same  Co morbidities that complicate the patient evaluation  Past Medical History:  Diagnosis Date   Anemia    Arthritis    rheumatoid   Asthma    Depression    GERD (gastroesophageal reflux disease)    History of COVID-19 03/29/2020   Hypertension 2019   Ischemic colitis (HCC) 09/2008   MSSA (methicillin susceptible Staphylococcus aureus)    sternaoclavicular absecess drainage   Osteoarthritis    sterum and right shoulder   PTSD (post-traumatic stress disorder)    Substance use disorder    Tachycardia       Dispostion: I considered admission for this patient, but at this time he does not meet inpatient criteria for admission and will be discharged with outpatient follow-up     Final Clinical Impression(s) / ED Diagnoses Final diagnoses:  None     @PCDICTATION @    Glendora Score, MD 04/16/23 [phone]

## 2023-04-17 ENCOUNTER — Telehealth: Payer: MEDICAID

## 2023-05-04 ENCOUNTER — Emergency Department (HOSPITAL_COMMUNITY)
Admission: EM | Admit: 2023-05-04 | Discharge: 2023-05-05 | Discharge status: Against Medical Advice | Payer: MEDICAID | Attending: Emergency Medicine | Admitting: Emergency Medicine

## 2023-05-04 DIAGNOSIS — M542 Cervicalgia: Secondary | ICD-10-CM | POA: Diagnosis present

## 2023-05-04 DIAGNOSIS — Z5321 Procedure and treatment not carried out due to patient leaving prior to being seen by health care provider: Secondary | ICD-10-CM | POA: Insufficient documentation

## 2023-05-04 DIAGNOSIS — R519 Headache, unspecified: Secondary | ICD-10-CM | POA: Diagnosis not present

## 2023-05-05 ENCOUNTER — Encounter (HOSPITAL_COMMUNITY): Payer: Self-pay | Admitting: *Deleted

## 2023-05-05 ENCOUNTER — Other Ambulatory Visit: Payer: Self-pay

## 2023-05-05 LAB — CBC
HCT: 44.8 % (ref 39.0–52.0)
Hemoglobin: 14.5 g/dL (ref 13.0–17.0)
MCH: 24.2 pg — ABNORMAL LOW (ref 26.0–34.0)
MCHC: 32.4 g/dL (ref 30.0–36.0)
MCV: 74.8 fL — ABNORMAL LOW (ref 80.0–100.0)
Platelets: 304 10*3/uL (ref 150–400)
RBC: 5.99 MIL/uL — ABNORMAL HIGH (ref 4.22–5.81)
RDW: 17.4 % — ABNORMAL HIGH (ref 11.5–15.5)
WBC: 10.6 10*3/uL — ABNORMAL HIGH (ref 4.0–10.5)
nRBC: 0 % (ref 0.0–0.2)

## 2023-05-05 LAB — COMPREHENSIVE METABOLIC PANEL
ALT: 24 U/L (ref 0–44)
AST: 21 U/L (ref 15–41)
Albumin: 4.1 g/dL (ref 3.5–5.0)
Alkaline Phosphatase: 109 U/L (ref 38–126)
Anion gap: 14 (ref 5–15)
BUN: 9 mg/dL (ref 6–20)
CO2: 20 mmol/L — ABNORMAL LOW (ref 22–32)
Calcium: 9.8 mg/dL (ref 8.9–10.3)
Chloride: 102 mmol/L (ref 98–111)
Creatinine, Ser: 0.9 mg/dL (ref 0.61–1.24)
GFR, Estimated: >60 mL/min (ref >60)
Glucose, Bld: 85 mg/dL (ref 70–99)
Potassium: 3.8 mmol/L (ref 3.5–5.1)
Sodium: 136 mmol/L (ref 135–145)
Total Bilirubin: 0.3 mg/dL (ref 0.0–1.2)
Total Protein: 7.2 g/dL (ref 6.5–8.1)

## 2023-05-05 LAB — URINALYSIS, ROUTINE W REFLEX MICROSCOPIC
Bilirubin Urine: NEGATIVE
Glucose, UA: NEGATIVE mg/dL
Hgb urine dipstick: NEGATIVE
Ketones, ur: NEGATIVE mg/dL
Leukocytes,Ua: NEGATIVE
Nitrite: NEGATIVE
Protein, ur: NEGATIVE mg/dL
Specific Gravity, Urine: <1.005 — ABNORMAL LOW (ref 1.005–1.030)
pH: 5.5 (ref 5.0–8.0)

## 2023-05-05 LAB — RESP PANEL BY RT-PCR (RSV, FLU A&B, COVID)  RVPGX2
Influenza A by PCR: NEGATIVE
Influenza B by PCR: NEGATIVE
Resp Syncytial Virus by PCR: NEGATIVE
SARS Coronavirus 2 by RT PCR: NEGATIVE

## 2023-05-05 LAB — LIPASE, BLOOD: Lipase: 24 U/L (ref 11–51)

## 2023-05-05 NOTE — ED Triage Notes (Signed)
 The pt is c/o the lt side of his neck that started on the rt side and he has a headache  he has been seen multiple times at [facility]  the  pt was driving with his mom and she reports that he stopped talking and he seemed to be unresponsive  with a rapid pulse and he came back around

## 2023-05-05 NOTE — ED Notes (Signed)
Pt name called multiple times for vitals, no response

## 2023-05-08 ENCOUNTER — Ambulatory Visit (INDEPENDENT_AMBULATORY_CARE_PROVIDER_SITE_OTHER): Payer: MEDICAID | Admitting: Diagnostic Neuroimaging

## 2023-05-08 ENCOUNTER — Encounter: Payer: Self-pay | Admitting: Diagnostic Neuroimaging

## 2023-05-08 VITALS — BP 127/76 | HR 96 | Ht 71.0 in | Wt 253.0 lb

## 2023-05-08 DIAGNOSIS — R519 Headache, unspecified: Secondary | ICD-10-CM | POA: Diagnosis not present

## 2023-05-08 DIAGNOSIS — H938X2 Other specified disorders of left ear: Secondary | ICD-10-CM | POA: Diagnosis not present

## 2023-05-08 NOTE — Patient Instructions (Signed)
 ABNORMAL SENSATIONS OF HEAD (NUMBNESS) / BRAIN FOG  - likely related to underlying caffeine overuse, substance abuse (dextromethorphan), mood disorder, PTSD, polypharmacy; some symptoms are gradually improving - continue to improve substance abuse, mood disorder, nutrition, exercise, activities and sleep hygiene - follow up with psychiatry and PCP

## 2023-05-08 NOTE — Progress Notes (Signed)
 [facility]  PATIENT: Herbert Walsh DOB: 12/22/1990  REFERRING CLINICIAN: Ignatius Specking, MD HISTORY FROM: patient  REASON FOR VISIT: follow up   HISTORICAL  CHIEF COMPLAINT:  Chief Complaint  Patient presents with   New Patient (Initial Visit)    Patient in room #7 and alone. Patient states he's been having headaches a lot and has numbness under his jaw, pressure on the back of his neck and head. Patient sore and pressure in his left ear and he hasn't slept for three days.    HISTORY OF PRESENT ILLNESS:   UPDATE (05/08/23, VRP): Since last visit, doing about the same. Continues with headache, left ear, pain and pressure. Has cut down on cough med pills (~10-15 tabs per day; none in the last 1-2 months). Continues with pepsi use, but has cut down apparently.   PRIOR HPI (05/24/22, VRP): 33 year old male here for evaluation of brain fog, headaches, memory loss, insomnia, stress.  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  He has discussed this with his psychiatrist but he is not ready to quit.  Some days he stays in a fog and high from this medication all day.  For past 2 years has had some issues with headaches on the right side spreading to the left side.  Sometimes associated with blurred vision and nausea.  Patient also drinks 1 to 3 L of Pepsi per day.  Patient has PTSD, bipolar disorder, management psychiatry.  He lives at home.  He does not work outside the home.  Sometimes he feels hopeless without any purpose.   REVIEW OF SYSTEMS: Full 14 system review of systems performed and negative with exception of: as per HPI.  ALLERGIES: No Known Allergies  HOME MEDICATIONS: Outpatient Medications Prior to Visit  Medication Sig  Dispense Refill   acyclovir (ZOVIRAX) 400 MG tablet Take 2 tablets (800 mg total) by mouth 5 (five) times daily. 50 tablet 0   allopurinol (ZYLOPRIM) 300 MG tablet Take 300 mg by mouth daily.     amoxicillin-clavulanate (AUGMENTIN) 875-125 MG tablet Take 1 tablet by mouth every 12 (twelve) hours. 14 tablet 0   atorvastatin (LIPITOR) 10 MG tablet Take 10 mg by mouth at bedtime.   12   Cholecalciferol (VITAMIN D3) 50 MCG (2000 UT) TABS Take 1 tablet (50 mcg total) by mouth daily. 30 tablet 2   cloNIDine (CATAPRES) 0.2 MG tablet Take 1 tablet (0.2 mg total) by mouth 3 (three) times daily. 270 tablet 0   folic acid (FOLVITE) 1 MG tablet Take 1 mg by mouth daily.     gabapentin (NEURONTIN) 100 MG capsule Take 1 capsule (100 mg total) by mouth 3 (three) times daily as needed. 20 capsule 0   glipiZIDE-metformin (METAGLIP) 5-500 MG tablet Take 1 tablet by mouth daily.     HUMIRA 40 MG/0.4ML PSKT Inject 40 mg into the skin once a week.     lisinopril (PRINIVIL,ZESTRIL) 10 MG tablet Take 10 mg by mouth daily.     methotrexate (RHEUMATREX) 2.5 MG tablet Take 6 tablets by mouth once a week.     mupirocin cream (BACTROBAN) 2 % Apply 1 Application topically 3 (three) times daily. Apply to the affected area of your left face 15 g 0  naproxen (NAPROSYN) 500 MG tablet Take 500 mg by mouth 2 (two) times daily as needed for moderate pain.     omeprazole (PRILOSEC) 40 MG capsule Take 1 capsule by mouth every morning.     sertraline (ZOLOFT) 100 MG tablet Take 2 tablets (200 mg total) by mouth daily. 180 tablet 2   No facility-administered medications prior to visit.    PAST MEDICAL HISTORY: Past Medical History:  Diagnosis Date   Anemia    Arthritis    rheumatoid   Asthma    Depression    GERD (gastroesophageal reflux disease)    History of COVID-19 03/29/2020   Hypertension 2019   Ischemic colitis (HCC) 09/2008   MSSA (methicillin susceptible Staphylococcus aureus)    sternaoclavicular absecess  drainage   Osteoarthritis    sterum and right shoulder   PTSD (post-traumatic stress disorder)    Substance use disorder    Tachycardia     PAST SURGICAL HISTORY: Past Surgical History:  Procedure Laterality Date   APPENDECTOMY     APPLICATION OF WOUND VAC Right 06/20/2019   Procedure: APPLICATION OF WOUND VAC;  Surgeon: [name], MD;  Location: [facility] OR;  Service: Vascular;  Laterality: Right;   APPLICATION OF WOUND VAC Right 06/23/2019   Procedure: APPLICATION OF WOUND VAC;  Surgeon: [name], MD;  Location: [facility] OR;  Service: Thoracic;  Laterality: Right;   COLONOSCOPY     x 2   I & D EXTREMITY Right 05/31/2019   Procedure: IRRIGATION AND DEBRIDEMENT LOWER EXTREMITY;  Surgeon: Tarry Kos, MD;  Location: [facility] OR;  Service: Orthopedics;  Laterality: Right;   I & D EXTREMITY Right 06/20/2019   Procedure: DEBRIDEMENT OF STERNOCLAVICULAR JOINT ABSCESS;  Surgeon: [name], MD;  Location: [facility] OR;  Service: Vascular;  Laterality: Right;   INCISION AND DRAINAGE OF WOUND Right 04/12/2020   Procedure: excision of right sternoclavicular joint infection;  Surgeon: Peggye Form, DO;  Location: [facility];  Service: Plastics;  Laterality: Right;  45 min total   STERNAL WOUND DEBRIDEMENT Right 09/19/2019   Procedure: EXCISIONAL DEBRIDEMENT AND IRRIGATION OF RIGHT STERNOCLAVICULAR JOINT WOUND;  Surgeon: [name], MD;  Location: [facility] OR;  Service: Thoracic;  Laterality: Right;   TEE WITHOUT CARDIOVERSION N/A 06/05/2019   Procedure: TRANSESOPHAGEAL ECHOCARDIOGRAM (TEE);  Surgeon: Lewayne Bunting, MD;  Location: [facility] ENDOSCOPY;  Service: Cardiovascular;  Laterality: N/A;   WOUND EXPLORATION Right 06/23/2019   Procedure: irrigation and clean out right shoulder;  Surgeon: [name], MD;  Location: [facility] OR;  Service: Thoracic;  Laterality: Right;    FAMILY HISTORY: Family History  Problem Relation Age of Onset   Alcohol abuse Mother    Depression Mother     Drug abuse Mother    Physical abuse Mother    Sexual abuse Mother    ADD / ADHD Paternal Aunt    Alcohol abuse Maternal Grandfather    Bipolar disorder Maternal Grandmother    Schizophrenia Maternal Grandmother    Drug abuse Maternal Grandmother    Alcohol abuse Paternal Grandfather    Depression Paternal Grandfather    Depression Paternal Grandmother    Drug abuse Paternal Grandmother     SOCIAL HISTORY: Social History   Socioeconomic History   Marital status: Legally Separated    Spouse name: Not on file   Number of children: 0   Years of education: Not on file   Highest education level: Some college, no degree  Occupational History   Not on file  Tobacco Use   Smoking status: Every Day    Current packs/day: 1.00    Average packs/day: 1 pack/day for 15.0 years (15.0 ttl pk-yrs)    Types: Cigarettes   Smokeless tobacco: Never  Vaping Use   Vaping status: Never Used  Substance and Sexual Activity   Alcohol use: Not Currently    Comment: rare beer   Drug use: Not Currently    Types: Marijuana    Comment: cough med 24 pills daily   Sexual activity: Not Currently  Other Topics Concern   Not on file  Social History Narrative   Not on file   Social Drivers of Health   Financial Resource Strain: Low Risk  (01/27/2019)   Received from [facility], [facility]   Overall Financial Resource Strain (CARDIA)    Difficulty of Paying Living Expenses: Not hard at all  Food Insecurity: Food Insecurity Present (06/20/2022)   Hunger Vital Sign    Worried About Running Out of Food in the Last Year: Sometimes true    Ran Out of Food in the Last Year: Sometimes true  Transportation Needs: No Transportation Needs (06/20/2022)   PRAPARE - Administrator, Civil Service (Medical): No    Lack of Transportation (Non-Medical): No  Physical Activity: Inactive (01/27/2019)   [facility]   Exercise Vital Sign    Days of Exercise per  Week: 0 days    Minutes of Exercise per Session: 0 min  Stress: No Stress Concern Present (01/27/2019)   Received from [facility], [facility] - Occupational Stress Questionnaire    Feeling of Stress : Not at all  Social Connections: Socially Isolated (01/27/2019)   Received from [facility], [facility]   Social Connection and Isolation Panel [NHANES]    Frequency of Communication with Friends and Family: More than three times a week    Frequency of Social Gatherings with Friends and Family: Once a week    Attends Religious Services: Never    Database administrator or Organizations: No    Attends Banker Meetings: Never    Marital Status: Never married  Intimate Partner Violence: Not At Risk (06/20/2022)   Humiliation, Afraid, Rape, and Kick questionnaire    Fear of Current or Ex-Partner: No    Emotionally Abused: No    Physically Abused: No    Sexually Abused: No     PHYSICAL EXAM  GENERAL EXAM/CONSTITUTIONAL: Vitals:  Vitals:   05/08/23 1100  BP: 127/76  Pulse: 96  Weight: 253 lb (114.8 kg)  Height: 5\' 11"  (1.803 m)   Body mass index is 35.29 kg/m. Wt Readings from Last 3 Encounters:  05/08/23 253 lb (114.8 kg)  05/05/23 270 lb 1 oz (122.5 kg)  04/15/23 270 lb 1 oz (122.5 kg)   Patient is in no distress; well developed, nourished and groomed; neck is supple  CARDIOVASCULAR: Examination of carotid arteries is normal; no carotid bruits Regular rate and rhythm, no murmurs Examination of peripheral vascular system by observation and palpation is normal  EYES: Ophthalmoscopic exam of optic discs and posterior segments is normal; no papilledema or hemorrhages No results found.  MUSCULOSKELETAL: Gait, strength, tone, movements noted in Neurologic exam below  NEUROLOGIC: MENTAL STATUS:      No data to display  awake, alert, oriented to person, place and time recent and remote  memory intact normal attention and concentration language fluent, comprehension intact, naming intact fund of knowledge appropriate  CRANIAL NERVE:  2nd - no papilledema on fundoscopic exam 2nd, 3rd, 4th, 6th - pupils equal and reactive to light, visual fields full to confrontation, extraocular muscles intact, no nystagmus 5th - facial sensation symmetric 7th - facial strength symmetric 8th - hearing intact 9th - palate elevates symmetrically, uvula midline 11th - shoulder shrug symmetric 12th - tongue protrusion midline  MOTOR:  normal bulk and tone, full strength in the BUE, BLE  SENSORY:  normal and symmetric to light touch, temperature, vibration  COORDINATION:  finger-nose-finger, fine finger movements normal  REFLEXES:  deep tendon reflexes present and symmetric  GAIT/STATION:  narrow based gait     DIAGNOSTIC DATA (LABS, IMAGING, TESTING) - I reviewed patient records, labs, notes, testing and imaging myself where available.  Lab Results  Component Value Date   WBC 10.6 (H) 05/05/2023   HGB 14.5 05/05/2023   HCT 44.8 05/05/2023   MCV 74.8 (L) 05/05/2023   PLT 304 05/05/2023      Component Value Date/Time   NA 136 05/05/2023 0045   NA 135 05/11/2022 1104   K 3.8 05/05/2023 0045   CL 102 05/05/2023 0045   CO2 20 (L) 05/05/2023 0045   GLUCOSE 85 05/05/2023 0045   BUN 9 05/05/2023 0045   BUN 4 (L) 05/11/2022 1104   CREATININE 0.90 05/05/2023 0045   CREATININE 0.98 12/16/2018 1028   CALCIUM 9.8 05/05/2023 0045   PROT 7.2 05/05/2023 0045   ALBUMIN 4.1 05/05/2023 0045   AST 21 05/05/2023 0045   ALT 24 05/05/2023 0045   ALKPHOS 109 05/05/2023 0045   BILITOT 0.3 05/05/2023 0045   GFRNONAA >60 05/05/2023 0045   GFRNONAA 111 08/06/2018 1522   GFRAA >60 09/19/2019 0716   GFRAA 129 08/06/2018 1522   Lab Results  Component Value Date   CHOL 189 07/07/2016   HDL 37 (L) 07/07/2016   LDLCALC 120 (H) 07/07/2016   TRIG 158 (H) 07/07/2016   CHOLHDL 5.1  07/07/2016   Lab Results  Component Value Date   HGBA1C 6.2 (H) 05/31/2019   No results found for: "VITAMINB12" Lab Results  Component Value Date   TSH 2.910 05/11/2022    08/12/21 MRI brain  1.  No acute intracranial abnormality. No structural abnormality identified to account for the patient's reported symptoms. A postcontrast MRI brain or face would improve sensitivity for pathology along the trigeminal nerve if clinically indicated.  2.  Small or atelectatic left maxillary sinus which is opacified. Correlate for sinusitis.   04/16/23 1. Normal CT venogram. No evidence for dural venous sinus thrombosis. 2. No other acute intracranial abnormality. 3. Chronic left maxillary sinus disease, better characterized on concomitant maxillofacial CT.  03/26/23  1. Normal head CT. 2. Normal CTA of the head and neck. 3. 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 Question silent sinus syndrome.  06/20/22  Normal brain MRI.     ASSESSMENT AND PLAN  33 y.o. year old male here with:  Dx:  1. Abnormal sensation in ear, left   2. Nonintractable headache, unspecified chronicity pattern, unspecified headache type     PLAN:  ABNORMAL SENSATIONS OF HEAD (NUMBNESS) / BRAIN FOG  - likely related to underlying caffeine overuse, substance abuse (dextromethorphan), mood disorder, PTSD, polypharmacy; some symptoms are gradually improving - continue to improve substance abuse, mood  disorder, nutrition, exercise, activities and sleep hygiene - follow up with psychiatry and PCP  Return for return to PCP.    Suanne Marker, MD 05/08/2023, 11:43 AM Certified in Neurology, Neurophysiology and Neuroimaging  [facility] [address] [place], [place] [address] [phone]

## 2023-05-26 ENCOUNTER — Emergency Department (HOSPITAL_COMMUNITY): Payer: MEDICAID

## 2023-05-26 ENCOUNTER — Encounter (HOSPITAL_COMMUNITY): Payer: Self-pay

## 2023-05-26 ENCOUNTER — Other Ambulatory Visit: Payer: Self-pay

## 2023-05-26 ENCOUNTER — Emergency Department (HOSPITAL_COMMUNITY)
Admission: EM | Admit: 2023-05-26 | Discharge: 2023-05-26 | Disposition: A | Discharge status: Other Acute Inpatient Hospital | Payer: MEDICAID | Source: Home / Self Care | Attending: Emergency Medicine | Admitting: Emergency Medicine

## 2023-05-26 ENCOUNTER — Emergency Department (HOSPITAL_COMMUNITY)
Admission: EM | Admit: 2023-05-26 | Discharge: 2023-05-26 | Disposition: A | Discharge status: Home / Self Care | Payer: MEDICAID | Attending: Emergency Medicine | Admitting: Emergency Medicine

## 2023-05-26 DIAGNOSIS — I10 Essential (primary) hypertension: Secondary | ICD-10-CM | POA: Insufficient documentation

## 2023-05-26 DIAGNOSIS — R462 Strange and inexplicable behavior: Secondary | ICD-10-CM

## 2023-05-26 DIAGNOSIS — F319 Bipolar disorder, unspecified: Secondary | ICD-10-CM

## 2023-05-26 DIAGNOSIS — F17210 Nicotine dependence, cigarettes, uncomplicated: Secondary | ICD-10-CM | POA: Insufficient documentation

## 2023-05-26 DIAGNOSIS — R220 Localized swelling, mass and lump, head: Secondary | ICD-10-CM | POA: Insufficient documentation

## 2023-05-26 DIAGNOSIS — Z79899 Other long term (current) drug therapy: Secondary | ICD-10-CM | POA: Insufficient documentation

## 2023-05-26 DIAGNOSIS — Z8616 Personal history of COVID-19: Secondary | ICD-10-CM | POA: Insufficient documentation

## 2023-05-26 DIAGNOSIS — F3110 Bipolar disorder, current episode manic without psychotic features, unspecified: Secondary | ICD-10-CM | POA: Insufficient documentation

## 2023-05-26 DIAGNOSIS — R519 Headache, unspecified: Secondary | ICD-10-CM | POA: Insufficient documentation

## 2023-05-26 DIAGNOSIS — E871 Hypo-osmolality and hyponatremia: Secondary | ICD-10-CM | POA: Insufficient documentation

## 2023-05-26 DIAGNOSIS — J45909 Unspecified asthma, uncomplicated: Secondary | ICD-10-CM | POA: Insufficient documentation

## 2023-05-26 DIAGNOSIS — R0602 Shortness of breath: Secondary | ICD-10-CM | POA: Insufficient documentation

## 2023-05-26 DIAGNOSIS — R42 Dizziness and giddiness: Secondary | ICD-10-CM | POA: Diagnosis present

## 2023-05-26 DIAGNOSIS — R079 Chest pain, unspecified: Secondary | ICD-10-CM | POA: Insufficient documentation

## 2023-05-26 DIAGNOSIS — R456 Violent behavior: Secondary | ICD-10-CM | POA: Insufficient documentation

## 2023-05-26 DIAGNOSIS — R739 Hyperglycemia, unspecified: Secondary | ICD-10-CM | POA: Diagnosis not present

## 2023-05-26 LAB — CBC WITH DIFFERENTIAL/PLATELET
Abs Immature Granulocytes: 0.05 10*3/uL (ref 0.00–0.07)
Abs Immature Granulocytes: 0.04 10*3/uL (ref 0.00–0.07)
Basophils Absolute: 0 10*3/uL (ref 0.0–0.1)
Basophils Absolute: 0 10*3/uL (ref 0.0–0.1)
Basophils Relative: 0 %
Basophils Relative: 0 %
Eosinophils Absolute: 0.4 10*3/uL (ref 0.0–0.5)
Eosinophils Absolute: 0.5 10*3/uL (ref 0.0–0.5)
Eosinophils Relative: 4 %
Eosinophils Relative: 5 %
HCT: 39 % (ref 39.0–52.0)
HCT: 40.9 % (ref 39.0–52.0)
Hemoglobin: 12.6 g/dL — ABNORMAL LOW (ref 13.0–17.0)
Hemoglobin: 13.1 g/dL (ref 13.0–17.0)
Immature Granulocytes: 1 %
Immature Granulocytes: 0 %
Lymphocytes Relative: 23 %
Lymphocytes Relative: 19 %
Lymphs Abs: 2.4 10*3/uL (ref 0.7–4.0)
Lymphs Abs: 2.1 10*3/uL (ref 0.7–4.0)
MCH: 24.2 pg — ABNORMAL LOW (ref 26.0–34.0)
MCH: 23.9 pg — ABNORMAL LOW (ref 26.0–34.0)
MCHC: 32.3 g/dL (ref 30.0–36.0)
MCHC: 32 g/dL (ref 30.0–36.0)
MCV: 74.9 fL — ABNORMAL LOW (ref 80.0–100.0)
MCV: 74.5 fL — ABNORMAL LOW (ref 80.0–100.0)
Monocytes Absolute: 0.5 10*3/uL (ref 0.1–1.0)
Monocytes Absolute: 0.5 10*3/uL (ref 0.1–1.0)
Monocytes Relative: 5 %
Monocytes Relative: 5 %
Neutro Abs: 6.9 10*3/uL (ref 1.7–7.7)
Neutro Abs: 8 10*3/uL — ABNORMAL HIGH (ref 1.7–7.7)
Neutrophils Relative %: 67 %
Neutrophils Relative %: 71 %
Platelets: 270 10*3/uL (ref 150–400)
Platelets: 277 10*3/uL (ref 150–400)
RBC: 5.21 MIL/uL (ref 4.22–5.81)
RBC: 5.49 MIL/uL (ref 4.22–5.81)
RDW: 18.5 % — ABNORMAL HIGH (ref 11.5–15.5)
RDW: 18.8 % — ABNORMAL HIGH (ref 11.5–15.5)
WBC: 10.3 10*3/uL (ref 4.0–10.5)
WBC: 11.2 10*3/uL — ABNORMAL HIGH (ref 4.0–10.5)
nRBC: 0 % (ref 0.0–0.2)
nRBC: 0 % (ref 0.0–0.2)

## 2023-05-26 LAB — COMPREHENSIVE METABOLIC PANEL WITH GFR
ALT: 33 U/L (ref 0–44)
AST: 30 U/L (ref 15–41)
Albumin: 3.7 g/dL (ref 3.5–5.0)
Alkaline Phosphatase: 110 U/L (ref 38–126)
Anion gap: 11 (ref 5–15)
BUN: 8 mg/dL (ref 6–20)
CO2: 23 mmol/L (ref 22–32)
Calcium: 9.6 mg/dL (ref 8.9–10.3)
Chloride: 98 mmol/L (ref 98–111)
Creatinine, Ser: 0.8 mg/dL (ref 0.61–1.24)
GFR, Estimated: >60 mL/min (ref >60)
Glucose, Bld: 228 mg/dL — ABNORMAL HIGH (ref 70–99)
Potassium: 3.9 mmol/L (ref 3.5–5.1)
Sodium: 132 mmol/L — ABNORMAL LOW (ref 135–145)
Total Bilirubin: 0.2 mg/dL (ref 0.0–1.2)
Total Protein: 6.8 g/dL (ref 6.5–8.1)

## 2023-05-26 LAB — RAPID URINE DRUG SCREEN, HOSP PERFORMED
Amphetamines: NOT DETECTED
Barbiturates: NOT DETECTED
Benzodiazepines: NOT DETECTED
Cocaine: NOT DETECTED
Opiates: NOT DETECTED
Tetrahydrocannabinol: NOT DETECTED

## 2023-05-26 LAB — BASIC METABOLIC PANEL WITH GFR
Anion gap: 11 (ref 5–15)
BUN: 7 mg/dL (ref 6–20)
CO2: 20 mmol/L — ABNORMAL LOW (ref 22–32)
Calcium: 9.4 mg/dL (ref 8.9–10.3)
Chloride: 101 mmol/L (ref 98–111)
Creatinine, Ser: 0.76 mg/dL (ref 0.61–1.24)
GFR, Estimated: >60 mL/min (ref >60)
Glucose, Bld: 255 mg/dL — ABNORMAL HIGH (ref 70–99)
Potassium: 3.6 mmol/L (ref 3.5–5.1)
Sodium: 132 mmol/L — ABNORMAL LOW (ref 135–145)

## 2023-05-26 LAB — ETHANOL
Alcohol, Ethyl (B): <10 mg/dL (ref <10)
Alcohol, Ethyl (B): <10 mg/dL (ref <10)

## 2023-05-26 MED ORDER — SERTRALINE HCL 50 MG PO TABS
50.0000 mg | ORAL_TABLET | Freq: Every day | ORAL | Status: DC
  Administered 2023-05-26: 50 mg via ORAL
  Filled 2023-05-26: qty 1

## 2023-05-26 MED ORDER — LISINOPRIL 10 MG PO TABS
10.0000 mg | ORAL_TABLET | Freq: Once | ORAL | Status: AC
  Administered 2023-05-26: 10 mg via ORAL
  Filled 2023-05-26: qty 1

## 2023-05-26 MED ORDER — KETOROLAC TROMETHAMINE 15 MG/ML IJ SOLN
15.0000 mg | Freq: Once | INTRAMUSCULAR | Status: AC
  Administered 2023-05-26: 15 mg via INTRAVENOUS
  Filled 2023-05-26: qty 1

## 2023-05-26 MED ORDER — IOHEXOL 350 MG/ML SOLN
75.0000 mL | Freq: Once | INTRAVENOUS | Status: AC | PRN
  Administered 2023-05-26: 75 mL via INTRAVENOUS

## 2023-05-26 MED ORDER — OLANZAPINE 5 MG PO TABS
5.0000 mg | ORAL_TABLET | Freq: Every day | ORAL | Status: DC
  Filled 2023-05-26: qty 1

## 2023-05-26 MED ORDER — PROCHLORPERAZINE EDISYLATE 10 MG/2ML IJ SOLN
10.0000 mg | Freq: Once | INTRAMUSCULAR | Status: AC
  Administered 2023-05-26: 10 mg via INTRAVENOUS
  Filled 2023-05-26: qty 2

## 2023-05-26 MED ORDER — HYDRALAZINE HCL 20 MG/ML IJ SOLN
10.0000 mg | Freq: Once | INTRAMUSCULAR | Status: DC
  Filled 2023-05-26: qty 1

## 2023-05-26 MED ORDER — DIPHENHYDRAMINE HCL 50 MG/ML IJ SOLN
12.5000 mg | Freq: Once | INTRAMUSCULAR | Status: AC
  Administered 2023-05-26: 12.5 mg via INTRAVENOUS
  Filled 2023-05-26: qty 1

## 2023-05-26 MED ORDER — ZIPRASIDONE MESYLATE 20 MG IM SOLR
INTRAMUSCULAR | Status: AC
  Administered 2023-05-26: 20 mg via INTRAMUSCULAR
  Filled 2023-05-26: qty 20

## 2023-05-26 MED ORDER — ZIPRASIDONE MESYLATE 20 MG IM SOLR: 20.0000 mg | Freq: Once | INTRAMUSCULAR | Status: AC

## 2023-05-26 MED ORDER — SERTRALINE HCL 20 MG/ML PO CONC
50.0000 mg | Freq: Every day | ORAL | Status: DC
  Filled 2023-05-26 (×2): qty 2.5

## 2023-05-26 MED ORDER — CLONIDINE HCL 0.2 MG PO TABS
0.2000 mg | ORAL_TABLET | Freq: Once | ORAL | Status: AC
  Administered 2023-05-26: 0.2 mg via ORAL
  Filled 2023-05-26: qty 1

## 2023-05-26 NOTE — ED Provider Notes (Signed)
 Behavioral health saw the patient and feels like he needs inpatient treatment.  We will start Zoloft 50 mg p.o. daily and Zyprexa 5 mg at bedtime.  He can also have Haldol 5 mg and Ativan 2 mg either p.o. or IM every 6 hours as needed for extreme agitation   Cheyenne Cotta, MD 05/26/23 1639

## 2023-05-26 NOTE — BH Assessment (Signed)
 This Clinical research associate contacted [facility] Care Coordinator at 1145 this date Louana Roup) to enter a consult on Macon Scala III at [facility]. Coordinator states patient to be seen at 1315 this date. Chat was initiated to include all parties.

## 2023-05-26 NOTE — ED Notes (Addendum)
 Pt started yelling and thrashing in bed. This RN and EDP went in to assess patient and pt stating the he is dying and throwing himself around the bed. Tried to redirect pt and explain that pt is going to hurt himself if he does not stop. Verbal Geodon order given by [facility] EDP. Pt threw himself in floor and stated that he can not move. When this RN went to give IM shot, pt moving all extremities and stating "do not give me a shot". Med given to pt and pt stating "I am going to die, you are going to regret it. Tell my mom I'm sorry I am going to die". Pt mattress now on floor and pt lifted onto mattress. No yelling or thrashing at this time.

## 2023-05-26 NOTE — ED Notes (Signed)
 Report given to [facility] of Adult Behavior Health at this time. Staff is made aware transport wont happen until after 2300 tonight.

## 2023-05-26 NOTE — ED Notes (Addendum)
 Pt dressed out into [facility] appropriate scrubs, clothing placed into personal belongings bag (shirt, jacket, pants, tennis shoes) and placed into patient locker 9

## 2023-05-26 NOTE — ED Notes (Addendum)
 Pt observed throwing himself over side rails of bed into floor. [facility] EDP was at bedside. Staff trying to redirect pt from throwing self into floor but was afraid for their own safety to touch the pt at that time due to pt swinging and throwing himself around, ending up in floor.

## 2023-05-26 NOTE — Progress Notes (Addendum)
 LCSW Progress Note:   MRN: [id number]  Colon Rueth III  05/26/2023 4:21 PM  According to the [facility] Telepsychiatry consult note by Sheliah Deutscher, MD, the patient meets criteria for inpatient treatment. Chart review states: 'The patient's degree of dysfunction is considerable, and he is likely to present to the ER again if discharged. Inpatient stabilization is required, on an involuntary basis if necessary.'  [facility], Linsey Strader, RN, has reviewed the patient for potential inpatient admission and requested that the EDP order the [facility] medication reconciliation and repeat vital signs, including blood pressure.  [facility] will follow up on coordinating the patient's admission to [facility], pending completion of the above requests.

## 2023-05-26 NOTE — ED Provider Notes (Signed)
 [place] EMERGENCY DEPARTMENT AT [facility] Provider Note  CSN: [id number] Arrival date & time: 05/26/23 2440  Chief Complaint(s) Headache  HPI Herbert Walsh is a 33 y.o. male history of chronic headache, bipolar disorder presenting to the emergency department with IVC.  The patient came to the emergency department for headache earlier this morning.  He reports that he has had similar headaches for some time.  He thinks it was exacerbated by a bump on his head and having his mother massage his neck.  He typically has headaches on the left side but now is having headache on the right side.  While in the emergency department, the patient became belligerent and agitated and threw himself on the ground, required IM sedation.  Patient had apparently eloped.  He was placed on IVC given his agitation and altered mental status.  He was brought back by police.  Past Medical History Past Medical History:  Diagnosis Date   Anemia    Arthritis    rheumatoid   Asthma    Depression    GERD (gastroesophageal reflux disease)    History of COVID-19 03/29/2020   Hypertension 2019   Ischemic colitis (HCC) 09/2008   MSSA (methicillin susceptible Staphylococcus aureus)    sternaoclavicular absecess drainage   Osteoarthritis    sterum and right shoulder   PTSD (post-traumatic stress disorder)    Substance use disorder    Tachycardia    Patient Active Problem List   Diagnosis Date Noted   Facial numbness 06/19/2022   Acute upper respiratory infection 10/06/2020   Adult-onset obesity 10/06/2020   Carbuncle and furuncle of buttock 10/06/2020   Contusion of knee 10/06/2020   Myopia 10/06/2020   Pharyngitis 10/06/2020   Screening examination for pulmonary tuberculosis 10/06/2020   Health examination of defined subpopulation 10/06/2020   Fitting and adjustment of spectacles and contact lenses 10/06/2020   COVID-19 01/22/2020   Nonhealing surgical wound 12/17/2019   Shoulder pain,  right 11/19/2019   Encounter for surgical wound dressing change 09/29/2019   Encounter for postoperative wound check 09/24/2019   Change or removal of surgical wound dressing 09/03/2019   Draining cutaneous sinus tract 08/27/2019   Open wound of chest wall with complication, right, sequela 08/20/2019   Wound check, abscess 08/06/2019   Neuropathic pain of chest 07/23/2019   Wound with infection determined by examination 07/17/2019   Open wound of chest wall 07/09/2019   Abscess of parasternal chest wall 07/02/2019   Osteomyelitis (HCC) 06/20/2019   MSSA (methicillin susceptible Staphylococcus aureus) infection 06/20/2019   Sternoclavicular (joint) (ligament) sprain 06/18/2019   Sternal osteomyelitis (HCC) 06/18/2019   Septic arthritis of knee, right (HCC) 05/31/2019   Myofascitis 05/31/2019   Prediabetes 05/31/2019   MSSA bacteremia 05/31/2019   Septic arthritis of right sternoclavicular joint (HCC) 05/30/2019   Rheumatoid arthritis (HCC) 05/30/2019   Glucosuria 05/30/2019   Tobacco use 05/05/2019   Use of energy drinks 05/05/2019   H. pylori infection 04/04/2019   Hyperplastic polyp of transverse colon 04/04/2019   Hyperplastic rectal polyp 04/04/2019   Intermittent explosive 03/19/2019   Difficulty controlling anger 03/19/2019   Colitis 03/11/2019   Gastroesophageal reflux disease 03/11/2019   NSAID long-term use 03/11/2019   Anemia 01/27/2019   Granulocytosis 01/27/2019   PTSD (post-traumatic stress disorder) 11/01/2017   Home Medication(s) Prior to Admission medications   Medication Sig Start Date End Date Taking? Authorizing Provider  acyclovir (ZOVIRAX) 400 MG tablet Take 2 tablets (800 mg total) by mouth  5 (five) times daily. 03/24/23   Triplett, Tammy, PA-C  allopurinol (ZYLOPRIM) 300 MG tablet Take 300 mg by mouth daily. 10/31/21   [provider]  amoxicillin-clavulanate (AUGMENTIN) 875-125 MG tablet Take 1 tablet by mouth every 12 (twelve) hours. 04/16/23    Kommor, Madison, MD  atorvastatin (LIPITOR) 10 MG tablet Take 10 mg by mouth at bedtime.  09/25/17   [provider]  Cholecalciferol (VITAMIN D3) 50 MCG (2000 UT) TABS Take 1 tablet (50 mcg total) by mouth daily. 06/20/22   Colin Dawley, MD  cloNIDine (CATAPRES) 0.2 MG tablet Take 1 tablet (0.2 mg total) by mouth 3 (three) times daily. 10/03/22   Todd Fossa, MD  folic acid (FOLVITE) 1 MG tablet Take 1 mg by mouth daily. 06/10/20   [provider]  gabapentin (NEURONTIN) 100 MG capsule Take 1 capsule (100 mg total) by mouth 3 (three) times daily as needed. 03/08/23   Kelsey Patricia, MD  glipiZIDE-metformin (METAGLIP) 5-500 MG tablet Take 1 tablet by mouth daily.    [provider]  HUMIRA 40 MG/0.4ML PSKT Inject 40 mg into the skin once a week.    [provider]  lisinopril (PRINIVIL,ZESTRIL) 10 MG tablet Take 10 mg by mouth daily.    [provider]  methotrexate (RHEUMATREX) 2.5 MG tablet Take 6 tablets by mouth once a week. 02/07/22   [provider]  mupirocin cream (BACTROBAN) 2 % Apply 1 Application topically 3 (three) times daily. Apply to the affected area of your left face 03/24/23   Triplett, Tammy, PA-C  naproxen (NAPROSYN) 500 MG tablet Take 500 mg by mouth 2 (two) times daily as needed for moderate pain. 09/19/22   [provider]  omeprazole (PRILOSEC) 40 MG capsule Take 1 capsule by mouth every morning. 05/25/20   [provider]  sertraline (ZOLOFT) 100 MG tablet Take 2 tablets (200 mg total) by mouth daily. 06/20/22 06/20/23  Colin Dawley, MD                                                                                                                                    Past Surgical History Past Surgical History:  Procedure Laterality Date   APPENDECTOMY     APPLICATION OF WOUND VAC Right 06/20/2019   Procedure: APPLICATION OF WOUND VAC;  Surgeon: Heriberto London, MD;  Location: [facility] OR;  Service: Vascular;   Laterality: Right;   APPLICATION OF WOUND VAC Right 06/23/2019   Procedure: APPLICATION OF WOUND VAC;  Surgeon: Heriberto London, MD;  Location: [facility] OR;  Service: Thoracic;  Laterality: Right;   COLONOSCOPY     x 2   I & D EXTREMITY Right 05/31/2019   Procedure: IRRIGATION AND DEBRIDEMENT LOWER EXTREMITY;  Surgeon: Wes Hamman, MD;  Location: [facility] OR;  Service: Orthopedics;  Laterality: Right;   I & D EXTREMITY Right 06/20/2019   Procedure: DEBRIDEMENT OF STERNOCLAVICULAR JOINT ABSCESS;  Surgeon: Matt Song, Donata Fryer, MD;  Location: [facility] OR;  Service: Vascular;  Laterality: Right;   INCISION AND DRAINAGE OF WOUND Right 04/12/2020   Procedure: excision of right sternoclavicular joint infection;  Surgeon: Thornell Flirt, DO;  Location: [facility];  Service: Plastics;  Laterality: Right;  45 min total   STERNAL WOUND DEBRIDEMENT Right 09/19/2019   Procedure: EXCISIONAL DEBRIDEMENT AND IRRIGATION OF RIGHT STERNOCLAVICULAR JOINT WOUND;  Surgeon: Heriberto London, MD;  Location: [facility] OR;  Service: Thoracic;  Laterality: Right;   TEE WITHOUT CARDIOVERSION N/A 06/05/2019   Procedure: TRANSESOPHAGEAL ECHOCARDIOGRAM (TEE);  Surgeon: Lenise Quince, MD;  Location: [facility] ENDOSCOPY;  Service: Cardiovascular;  Laterality: N/A;   WOUND EXPLORATION Right 06/23/2019   Procedure: irrigation and clean out right shoulder;  Surgeon: Heriberto London, MD;  Location: [facility] OR;  Service: Thoracic;  Laterality: Right;   Family History Family History  Problem Relation Age of Onset   Alcohol abuse Mother    Depression Mother    Drug abuse Mother    Physical abuse Mother    Sexual abuse Mother    ADD / ADHD Paternal Aunt    Alcohol abuse Maternal Grandfather    Bipolar disorder Maternal Grandmother    Schizophrenia Maternal Grandmother    Drug abuse Maternal Grandmother    Alcohol abuse Paternal Grandfather    Depression Paternal Grandfather    Depression Paternal Grandmother    Drug abuse Paternal Grandmother      Social History Social History   Tobacco Use   Smoking status: Every Day    Current packs/day: 1.00    Average packs/day: 1 pack/day for 15.0 years (15.0 ttl pk-yrs)    Types: Cigarettes   Smokeless tobacco: Never  Vaping Use   Vaping status: Never Used  Substance Use Topics   Alcohol use: Not Currently    Comment: rare beer   Drug use: Not Currently    Types: Marijuana    Comment: cough med 24 pills daily   Allergies Patient has no known allergies.  Review of Systems Review of Systems  All other systems reviewed and are negative.   Physical Exam Vital Signs  I have reviewed the triage vital signs BP (!) 137/97 (BP Location: Right Arm)   Pulse (!) 111   Temp 98.1 F (36.7 C)   Resp 16   Ht 5\' 11"  (1.803 m)   Wt 120.2 kg   SpO2 97%   BMI 36.96 kg/m  Physical Exam Vitals and nursing note reviewed.  Constitutional:      General: He is not in acute distress.    Appearance: Normal appearance.  HENT:     Head:     Comments: Scab on the right parietal scalp    Mouth/Throat:     Mouth: Mucous membranes are moist.  Eyes:     General: No visual field deficit.    Conjunctiva/sclera: Conjunctivae normal.  Cardiovascular:     Rate and Rhythm: Normal rate and regular rhythm.  Pulmonary:     Effort: Pulmonary effort is normal. No respiratory distress.     Breath sounds: Normal breath sounds.  Abdominal:     General: Abdomen is flat.     Palpations: Abdomen is soft.     Tenderness: There is no abdominal tenderness.  Musculoskeletal:     Cervical back: Neck supple.     Right lower leg: No edema.     Left lower leg: No edema.  Skin:    General:  Skin is warm and dry.     Capillary Refill: Capillary refill takes less than 2 seconds.  Neurological:     Mental Status: He is alert and oriented to person, place, and time. Mental status is at baseline.     Cranial Nerves: No cranial nerve deficit or facial asymmetry.     Sensory: No sensory deficit.     Motor: No  weakness.     Coordination: Coordination normal.  Psychiatric:        Mood and Affect: Mood normal. Affect is labile and angry.        Speech: Speech is rapid and pressured.        Behavior: Behavior is hyperactive.        Thought Content: Thought content is not paranoid. Thought content does not include homicidal or suicidal ideation.     ED Results and Treatments Labs (all labs ordered are listed, but only abnormal results are displayed) Labs Reviewed  COMPREHENSIVE METABOLIC PANEL WITH GFR - Abnormal; Notable for the following components:      Result Value   Sodium 132 (*)    Glucose, Bld 228 (*)    All other components within normal limits  CBC WITH DIFFERENTIAL/PLATELET - Abnormal; Notable for the following components:   WBC 11.2 (*)    MCV 74.5 (*)    MCH 23.9 (*)    RDW 18.8 (*)    Neutro Abs 8.0 (*)    All other components within normal limits  ETHANOL  RAPID URINE DRUG SCREEN, HOSP PERFORMED                                                                                                                          Radiology CT ANGIO HEAD NECK W WO CM Result Date: 05/26/2023 CLINICAL DATA:  Neck trauma, arterial injury suspected. Headache. Right-sided neck and head numbness. EXAM: CT ANGIOGRAPHY HEAD AND NECK WITH AND WITHOUT CONTRAST TECHNIQUE: Multidetector CT imaging of the head and neck was performed using the standard protocol during bolus administration of intravenous contrast. Multiplanar CT image reconstructions and MIPs were obtained to evaluate the vascular anatomy. Carotid stenosis measurements (when applicable) are obtained utilizing NASCET criteria, using the distal internal carotid diameter as the denominator. RADIATION DOSE REDUCTION: This exam was performed according to the departmental dose-optimization program which includes automated exposure control, adjustment of the mA and/or kV according to patient size and/or use of iterative reconstruction technique.  CONTRAST:  75mL OMNIPAQUE IOHEXOL 350 MG/ML SOLN COMPARISON:  Head CT 05/26/2023. CT head venogram 04/16/2023. CTA head and neck 03/26/2023. FINDINGS: CT HEAD FINDINGS Brain: There is no evidence of an acute infarct, intracranial hemorrhage, mass, midline shift, or extra-axial fluid collection. Cerebral volume is normal. The ventricles are normal in size. Vascular: No hyperdense vessel. Skull: No acute fracture or suspicious lesion. Sinuses/Orbits: Chronic left maxillary sinusitis with complete sinus opacification and atelectasis. Clear mastoid air cells. Unremarkable orbits. Other: Mild left posterior scalp soft tissue swelling, unchanged  from today's earlier CT. Review of the MIP images confirms the above findings CTA NECK FINDINGS Aortic arch: Standard branching. Widely patent brachiocephalic and subclavian arteries. Right carotid system: Patent and smooth without evidence of stenosis or dissection. Left carotid system: Patent and smooth without evidence of stenosis or dissection. Vertebral arteries: Patent and smooth without evidence of stenosis or dissection. Moderately dominant left vertebral artery. Skeleton: No acute osseous abnormality or suspicious lesion. Edentulous. Other neck: No evidence of cervical lymphadenopathy or mass. Upper chest: Clear lung apices. Review of the MIP images confirms the above findings CTA HEAD FINDINGS Anterior circulation: The internal carotid arteries are widely patent from skull base to carotid termini. ACAs and MCAs are patent without evidence of a proximal branch occlusion or significant proximal stenosis. No aneurysm is identified. Posterior circulation: The intracranial vertebral arteries are widely patent to the basilar. Patent PICA and SCA origins are visualized bilaterally. The basilar artery is widely patent. There are moderately large left and diminutive or absent right posterior communicating arteries. Both PCAs are patent without evidence of a significant proximal  stenosis. No aneurysm is identified. Venous sinuses: Patent. Anatomic variants: None. Review of the MIP images confirms the above findings IMPRESSION: Negative CTA of the head and neck. Electronically Signed   By: Aundra Lee M.D.   On: 05/26/2023 10:13   CT Head Wo Contrast Result Date: 05/26/2023 CLINICAL DATA:  Head trauma, moderate to severe EXAM: CT HEAD WITHOUT CONTRAST TECHNIQUE: Contiguous axial images were obtained from the base of the skull through the vertex without intravenous contrast. RADIATION DOSE REDUCTION: This exam was performed according to the departmental dose-optimization program which includes automated exposure control, adjustment of the mA and/or kV according to patient size and/or use of iterative reconstruction technique. COMPARISON:  03/07/2023 FINDINGS: Brain: No evidence of acute infarction, hemorrhage, hydrocephalus, extra-axial collection or mass lesion/mass effect. Vascular: No hyperdense vessel or unexpected calcification. Skull: Left posterior scalp swelling without underlying fracture. Sinuses/Orbits: No evidence of injury IMPRESSION: No evidence of intracranial injury. Electronically Signed   By: Ronnette Coke M.D.   On: 05/26/2023 04:27    Pertinent labs & imaging results that were available during my care of the patient were reviewed by me and considered in my medical decision making (see MDM for details).  Medications Ordered in ED Medications  ketorolac (TORADOL) 15 MG/ML injection 15 mg (15 mg Intravenous Given 05/26/23 0934)  prochlorperazine (COMPAZINE) injection 10 mg (10 mg Intravenous Given 05/26/23 0934)  diphenhydrAMINE (BENADRYL) injection 12.5 mg (12.5 mg Intravenous Given 05/26/23 0931)  iohexol (OMNIPAQUE) 350 MG/ML injection 75 mL (75 mLs Intravenous Contrast Given 05/26/23 0941)                                                                                                                                     Procedures Procedures  (including  critical care time)  Medical Decision Making / ED Course  MDM:  33 year old presenting to the emergency department with IVC, headache.  Patient in no acute distress.  Vitals with some tachycardia.  Patient seems manic.  Neurologic exam with no focal finding.  Unclear cause of headache, patient seems pretty manic.  Low concern for dangerous process.  Had CT scan of the head earlier this morning which is negative.  Given complaint of neck pain after some kind of manipulation will obtain CT angio head and neck to rule out dissection but overall concern for this is low.  Seems similar to previous headaches, patient is seeing neurology for this.  He denies any suicidal homicidal ideation but he seems to be quite manic, was throwing himself on the floor of the emergency department yesterday, requiring IM sedation.  Given his agitated behavior and elopement yesterday, will uphold IVC.  Will consult psychiatry once medically cleared.  If he is cleared by psychiatry then can probably be discharged   Clinical Course as of 05/26/23 1104  Sat May 26, 2023  1103 CTA head is negative.  Laboratory testing with mild leukocytosis, otherwise reassuring.  Patient is medically clear for psychiatric evaluation for his abnormal behavior. [WS]    Clinical Course User Index [WS] Isaiah Marc, Dozier Genre, MD     Additional history obtained: -Additional history obtained from police -External records from outside source obtained and reviewed including: Chart review including previous notes, labs, imaging, consultation notes including recent ER visit    Lab Tests: -I ordered, reviewed, and interpreted labs.   The pertinent results include:   Labs Reviewed  COMPREHENSIVE METABOLIC PANEL WITH GFR - Abnormal; Notable for the following components:      Result Value   Sodium 132 (*)    Glucose, Bld 228 (*)    All other components within normal limits  CBC WITH DIFFERENTIAL/PLATELET - Abnormal; Notable for the  following components:   WBC 11.2 (*)    MCV 74.5 (*)    MCH 23.9 (*)    RDW 18.8 (*)    Neutro Abs 8.0 (*)    All other components within normal limits  ETHANOL  RAPID URINE DRUG SCREEN, HOSP PERFORMED    Notable for mild hyperglycemia. Mild leukocytosis   EKG   EKG Interpretation Date/Time:  Saturday May 26 2023 08:57:45 EDT Ventricular Rate:  112 PR Interval:  134 QRS Duration:  98 QT Interval:  344 QTC Calculation: 469 R Axis:   81  Text Interpretation: Sinus tachycardia Cannot rule out Inferior infarct , age undetermined Abnormal ECG Confirmed by Hiawatha Lout ([id number]) on 05/26/2023 9:06:42 AM         Imaging Studies ordered: I ordered imaging studies including CTA head/neck On my interpretation imaging demonstrates no acute process  I independently visualized and interpreted imaging. I agree with the radiologist interpretation   Medicines ordered and prescription drug management: Meds ordered this encounter  Medications   ketorolac (TORADOL) 15 MG/ML injection 15 mg   prochlorperazine (COMPAZINE) injection 10 mg   diphenhydrAMINE (BENADRYL) injection 12.5 mg   iohexol (OMNIPAQUE) 350 MG/ML injection 75 mL    -I have reviewed the patients home medicines and have made adjustments as needed  Social Determinants of Health:  Diagnosis or treatment significantly limited by social determinants of health: obesity   Reevaluation: After the interventions noted above, I reevaluated the patient and found that their symptoms have improved  Co morbidities that complicate the patient evaluation  Past Medical History:  Diagnosis Date   Anemia    Arthritis  rheumatoid   Asthma    Depression    GERD (gastroesophageal reflux disease)    History of COVID-19 03/29/2020   Hypertension 2019   Ischemic colitis (HCC) 09/2008   MSSA (methicillin susceptible Staphylococcus aureus)    sternaoclavicular absecess drainage   Osteoarthritis    sterum and right shoulder    PTSD (post-traumatic stress disorder)    Substance use disorder    Tachycardia       Dispostion: Disposition decision including need for hospitalization was considered, and patient boarding for psychiatric evaluation    Final Clinical Impression(s) / ED Diagnoses Final diagnoses:  Bizarre behavior  Bad headache     This chart was dictated using voice recognition software.  Despite best efforts to proofread,  errors can occur which can change the documentation meaning.    Mordecai Applebaum, MD 05/26/23 1105

## 2023-05-26 NOTE — ED Notes (Signed)
 Refusing post fall vitals at this time

## 2023-05-26 NOTE — ED Notes (Addendum)
 Pt just walked out of room and seen heading down hall to exit. EDP made aware. To take out IVC paperwork at this time.

## 2023-05-26 NOTE — ED Provider Notes (Addendum)
 [facility] AT [facility] Provider Note   CSN: [id number] Arrival date & time: 05/26/23  0319     History  No chief complaint on file.   Herbert Walsh is a 33 y.o. male.  HPI     This is a 33 year old male with a history of PTSD, substance use disorder, hypertension, reflux who presents with multiple complaints.  Patient presented by [facility].  He initially complained of a bump on the right side of his head.  He says piquantly tells me multiple times "I think I am just going to die."  When trying to get further information from him, he will not elaborate.  However, when asked pointed questions he responds yes to everything including chest pain, shortness of breath, abdominal pain, nausea, vomiting, dizziness.  He does not appear to be in any respiratory distress.  He denies alcohol or drug use tonight.  Initially was cooperative but EMS reports that he was verbally aggressive.  Subsequent encounter with the patient, he is aggressive and yelling.  He ultimately threw himself off the bed.  He was not noted to hit his head or lose consciousness.  Staff was at the bedside including myself as we try to verbally redirect the patient; however, we did not engage with the patient for fear of our own safety.  Home Medications Prior to Admission medications   Medication Sig Start Date End Date Taking? Authorizing Provider  acyclovir (ZOVIRAX) 400 MG tablet Take 2 tablets (800 mg total) by mouth 5 (five) times daily. 03/24/23   Triplett, Tammy, PA-C  allopurinol (ZYLOPRIM) 300 MG tablet Take 300 mg by mouth daily. 10/31/21   [provider]  amoxicillin-clavulanate (AUGMENTIN) 875-125 MG tablet Take 1 tablet by mouth every 12 (twelve) hours. 04/16/23   Kommor, Madison, MD  atorvastatin (LIPITOR) 10 MG tablet Take 10 mg by mouth at bedtime.  09/25/17   [provider]  Cholecalciferol (VITAMIN D3) 50 MCG (2000 UT) TABS Take 1 tablet (50 mcg  total) by mouth daily. 06/20/22   Colin Dawley, MD  cloNIDine (CATAPRES) 0.2 MG tablet Take 1 tablet (0.2 mg total) by mouth 3 (three) times daily. 10/03/22   Todd Fossa, MD  folic acid (FOLVITE) 1 MG tablet Take 1 mg by mouth daily. 06/10/20   [provider]  gabapentin (NEURONTIN) 100 MG capsule Take 1 capsule (100 mg total) by mouth 3 (three) times daily as needed. 03/08/23   Kelsey Patricia, MD  glipiZIDE-metformin (METAGLIP) 5-500 MG tablet Take 1 tablet by mouth daily.    [provider]  HUMIRA 40 MG/0.4ML PSKT Inject 40 mg into the skin once a week.    [provider]  lisinopril (PRINIVIL,ZESTRIL) 10 MG tablet Take 10 mg by mouth daily.    [provider]  methotrexate (RHEUMATREX) 2.5 MG tablet Take 6 tablets by mouth once a week. 02/07/22   [provider]  mupirocin cream (BACTROBAN) 2 % Apply 1 Application topically 3 (three) times daily. Apply to the affected area of your left face 03/24/23   Triplett, Tammy, PA-C  naproxen (NAPROSYN) 500 MG tablet Take 500 mg by mouth 2 (two) times daily as needed for moderate pain. 09/19/22   [provider]  omeprazole (PRILOSEC) 40 MG capsule Take 1 capsule by mouth every morning. 05/25/20   [provider]  sertraline (ZOLOFT) 100 MG tablet Take 2 tablets (200 mg total) by mouth daily. 06/20/22 06/20/23  Colin Dawley, MD  Allergies    Patient has no known allergies.    Review of Systems   Review of Systems  Respiratory:  Positive for shortness of breath.   Cardiovascular:  Positive for chest pain.  Neurological:  Positive for dizziness and headaches.  All other systems reviewed and are negative.   Physical Exam Updated Vital Signs BP (!) 171/111   Pulse (!) 117   Temp 98.4 F (36.9 C)   Resp (!) 28   SpO2 97%  Physical Exam Vitals and nursing note reviewed.  Constitutional:      Appearance: He is well-developed. He is obese. He is not ill-appearing.  HENT:      Head: Normocephalic and atraumatic.  Eyes:     Pupils: Pupils are equal, round, and reactive to light.  Cardiovascular:     Rate and Rhythm: Normal rate and regular rhythm.     Heart sounds: Normal heart sounds. No murmur heard. Pulmonary:     Effort: Pulmonary effort is normal. No respiratory distress.     Breath sounds: Normal breath sounds. No wheezing.  Abdominal:     Palpations: Abdomen is soft.     Tenderness: There is no abdominal tenderness. There is no rebound.     Comments: Abdominal scarring noted  Musculoskeletal:     Cervical back: Neck supple.  Lymphadenopathy:     Cervical: No cervical adenopathy.  Skin:    General: Skin is warm and dry.  Neurological:     Mental Status: He is alert and oriented to person, place, and time.     Comments: Moves all 4 extremities  Psychiatric:     Comments: Labile     ED Results / Procedures / Treatments   Labs (all labs ordered are listed, but only abnormal results are displayed) Labs Reviewed  CBC WITH DIFFERENTIAL/PLATELET - Abnormal; Notable for the following components:      Result Value   Hemoglobin 12.6 (*)    MCV 74.9 (*)    MCH 24.2 (*)    RDW 18.5 (*)    All other components within normal limits  BASIC METABOLIC PANEL WITH GFR - Abnormal; Notable for the following components:   Sodium 132 (*)    CO2 20 (*)    Glucose, Bld 255 (*)    All other components within normal limits  ETHANOL  RAPID URINE DRUG SCREEN, HOSP PERFORMED    EKG EKG Interpretation Date/Time:  Saturday May 26 2023 03:36:18 EDT Ventricular Rate:  122 PR Interval:  138 QRS Duration:  102 QT Interval:  322 QTC Calculation: 459 R Axis:   86  Text Interpretation: Sinus tachycardia RSR' in V1 or V2, right VCD or RVH Baseline wander in lead(s) V2 Confirmed by Donita Furrow ([id number]) on 05/26/2023 4:07:53 AM  Radiology CT Head Wo Contrast Result Date: 05/26/2023 CLINICAL DATA:  Head trauma, moderate to severe EXAM: CT HEAD WITHOUT CONTRAST  TECHNIQUE: Contiguous axial images were obtained from the base of the skull through the vertex without intravenous contrast. RADIATION DOSE REDUCTION: This exam was performed according to the departmental dose-optimization program which includes automated exposure control, adjustment of the mA and/or kV according to patient size and/or use of iterative reconstruction technique. COMPARISON:  03/07/2023 FINDINGS: Brain: No evidence of acute infarction, hemorrhage, hydrocephalus, extra-axial collection or mass lesion/mass effect. Vascular: No hyperdense vessel or unexpected calcification. Skull: Left posterior scalp swelling without underlying fracture. Sinuses/Orbits: No evidence of injury IMPRESSION: No evidence of intracranial injury. Electronically Signed   By: Arlyce Lambert  Watts M.D.   On: 05/26/2023 04:27    Procedures .Critical Care  Performed by: Rory Collard, MD Authorized by: Rory Collard, MD   Critical care provider statement:    Critical care time (minutes):  60   Critical care was necessary to treat or prevent imminent or life-threatening deterioration of the following conditions: Acute agitation requiring physical restraint and chemical restraint.   Critical care was time spent personally by me on the following activities:  Development of treatment plan with patient or surrogate, discussions with consultants, evaluation of patient's response to treatment, examination of patient, ordering and review of laboratory studies, ordering and review of radiographic studies, ordering and performing treatments and interventions, pulse oximetry, re-evaluation of patient's condition and review of old charts     Medications Ordered in ED Medications  ziprasidone (GEODON) injection 20 mg (20 mg Intramuscular Given 05/26/23 0444)    ED Course/ Medical Decision Making/ A&P Clinical Course as of 05/26/23 0605  Sat May 26, 2023  0449 Patient noted to be yelling.  He is yelling "help me".  I  presented to the bedside.  Discussed with him that we are here to help him but that he needed to stop yelling.  He states he cannot help it.  He subsequently began to flail in the bed.  Requested nursing and security to the bedside.  Patient was noted to continue to yell and curse while he ultimately flopped himself off the bed while the guard rail was up.  He did not hit his head.  He did not lose consciousness.  I was at the bedside the entire time but did not engage out of potential danger for myself and staff.  He subsequently laid on the floor and then stated "I cannot move."  However, when Geodon was being administered, patient moves all 4 extremities voluntarily in an attempt to avoid injection.  He required security to restrain him.   Patient continued to say that he could not breathe.  He was not noted to be in any respiratory distress and again continued to phonate normally. [CH]  0600 Per nursing, patient eloped.  He was seen walking out without any difficulty.  Unfortunately, patient received Geodon approximately 30 minutes prior to this.  Given this and his episode of agitation where he did not appear to have insight and was a danger to himself and staff, do not feel that he has capacity to leave on his own accord.  IVC paperwork was placed so that the patient can be brought back to the emergency room for further monitoring given that he was given antipsychotics.  He will need TTS evaluation at that time. [CH]    Clinical Course User Index [CH] Marcie Shearon, Vonzella Guernsey, MD                                 Medical Decision Making Amount and/or Complexity of Data Reviewed Labs: ordered. Radiology: ordered.  Risk Prescription drug management.   This patient presents to the ED for concern of multiple complaints, this involves an extensive number of treatment options, and is a complaint that carries with it a high risk of complications and morbidity.  I considered the following differential and  admission for this acute, potentially life threatening condition.  The differential diagnosis includes acute injury, head injury, pneumonia, viral illness, polysubstance abuse, psychosis  MDM:    This is a 33 year old male who presents  with multiple somatic complaints.  He has a history of polysubstance abuse but denies that now.  He is tachycardic and hypertensive.  History of tachycardia.  Labs obtained.  CT head obtained given patient's concern for a knot on his head.  I do not appreciate this on exam.  CT scan does not show any evidence of intracranial abnormality.  EKG shows no evidence of acute arrhythmia or ischemia.  Basic lab work is notable for mild hyponatremia and hyperglycemia.  See clinical course above.  Patient had an episode of acute agitation where he was not able to be redirected.  He threw himself off the bed but did not appear to injure himself as I was at the bedside.  We did not intervene secondary to concerns for staff safety.  He was unable to be redirected verbally.  Patient was given a dose of Geodon in hopes to calm him down.  Ultimately, patient eloped.  IVC paperwork was drafted as patient did not appear to have capacity or insight into his illness.  Unclear whether this was psychosis or substance induced.  He also received a medication that would require monitoring.   (Labs, imaging, consults)  Labs: I Ordered, and personally interpreted labs.  The pertinent results include: CBC, BMP, EtOH  Imaging Studies ordered: I ordered imaging studies including CT head I independently visualized and interpreted imaging. I agree with the radiologist interpretation  Additional history obtained from chart review.  External records from outside source obtained and reviewed including evaluations  Cardiac Monitoring: The patient was maintained on a cardiac monitor.  If on the cardiac monitor, I personally viewed and interpreted the cardiac monitored which showed an underlying rhythm  of: Sinus tachycardia  Reevaluation: After the interventions noted above, I reevaluated the patient and found that they have :stayed the same  Social Determinants of Health:  PTSD, substance use disorder  Disposition: Eloped, IVC paperwork in place  Co morbidities that complicate the patient evaluation  Past Medical History:  Diagnosis Date   Anemia    Arthritis    rheumatoid   Asthma    Depression    GERD (gastroesophageal reflux disease)    History of COVID-19 03/29/2020   Hypertension 2019   Ischemic colitis (HCC) 09/2008   MSSA (methicillin susceptible Staphylococcus aureus)    sternaoclavicular absecess drainage   Osteoarthritis    sterum and right shoulder   PTSD (post-traumatic stress disorder)    Substance use disorder    Tachycardia      Medicines Meds ordered this encounter  Medications   ziprasidone (GEODON) 20 MG injection    Ward, Angus Bark A: cabinet override   ziprasidone (GEODON) injection 20 mg    I have reviewed the patients home medicines and have made adjustments as needed  Problem List / ED Course: Problem List Items Addressed This Visit   None               Final Clinical Impression(s) / ED Diagnoses Final diagnoses:  None    Rx / DC Orders ED Discharge Orders     None         Shimon Trowbridge, Vonzella Guernsey, MD 05/26/23 4696    Rory Collard, MD 05/26/23 [phone]

## 2023-05-26 NOTE — ED Triage Notes (Signed)
 Patient was originally seen for headache and numbness on the back of his head and right side of neck, fell then left AMA. Patient was brought back Involuntarily committed.

## 2023-05-26 NOTE — ED Notes (Signed)
Faxed IVC paperwork to [facility] [phone] and [phone].

## 2023-05-26 NOTE — ED Notes (Signed)
 Patient reported his face has numbness that extended down his neck, arm, and has numbness in his hand. When assessing them and palpating, patient claimed they felt different and the numbness changed in sensation.

## 2023-05-26 NOTE — ED Notes (Signed)
 Belongings was given to police for transport
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
## 2023-05-26 NOTE — ED Notes (Signed)
 Pt reports of worsening head and neck pain. Provider Zammit made aware. Pt repeatly requests for a CT scan. This nurse asked the provider and states 'we may consider a ct scan tomorrow'. Pt was notified.

## 2023-05-26 NOTE — Consult Note (Signed)
 [facility] Consult Note  Patient Name: Herbert Walsh MRN: [id number] DOB: Dec 25, 1990 DATE OF Consult: 05/26/2023  PRIMARY PSYCHIATRIC DIAGNOSES  1.  Bipolar disorder-NOS, current episode manic 2.   3.    RECOMMENDATIONS  Recommendations: Medication recommendations: given current mania, decrease zoloft to 50mg  po qday, start zyprexa 5mg  po at bedtime, can utilize haldol/ativan 5mg /2mg  po/IM if refused q6hrs prn extreme agitation. Non-Medication/therapeutic recommendations: will need 1:1 due to current mental state, lack of insight, elopement attempt.  Check EKG and hold antipsychotics if QTC >500.  Obtain UDS if possible. Is inpatient psychiatric hospitalization recommended for this patient? Yes (Explain why): Pt.'s degree of dysfunction is considerable and he is likely to present to ER again were he to be discharged.  Requires inpatient stabilization on an involuntary basis if necessary. Is another care setting recommended for this patient? (examples may include Crisis Stabilization Unit, Residential/Recovery Treatment, ALF/SNF, Memory Care Unit)  No (Explain why): acuity too high From a psychiatric perspective, is this patient appropriate for discharge to an outpatient setting/resource or other less restrictive environment for continued care?  No (Explain why): acuity too high-see above Follow-Up Telepsychiatry C/L services: We will continue to follow this patient with you until stabilized or discharged.  If you have any questions or concerns, please call our TeleCare Coordination service at  [phone] and ask for myself or the provider on-call. Communication: Treatment team members (and family members if applicable) who were involved in treatment/care discussions and planning, and with whom we spoke or engaged with via secure text/chat, include the following: secure Epic chat with treatment team, attempted phone call.  Thank you for involving [place] in the care of this patient. If you  have any additional questions or concerns, please call [phone] and ask for me or the provider on-call.  TELEPSYCHIATRY ATTESTATION & CONSENT  As the provider for this telehealth consult, I attest that I verified the patient's identity using two separate identifiers, introduced myself to the patient, provided my credentials, disclosed my location, and performed this encounter via a HIPAA-compliant, real-time, face-to-face, two-way, interactive audio and video platform and with the full consent and agreement of the patient (or guardian as applicable.)  Patient physical location: [facility]. Telehealth provider physical location: home office in state of [place].  Video start time: 1215 (Central Time) Video end time: 1305 (Central Time)  IDENTIFYING DATA  Herbert Walsh is a 33 y.o. year-old male for whom a psychiatric consultation has been ordered by the primary provider. The patient was identified using two separate identifiers.  CHIEF COMPLAINT/REASON FOR CONSULT  Head/neck pain  HISTORY OF PRESENT ILLNESS (HPI)  The patient is a 33 y/o M presenting to ER via police.  He cannot fully explain the circumstances around this, reports he was placed on IVC.  After presentation he apparently became agitated and threw himself on the floor, attempted to elope, required prn medication for agitation.  He presents with rambling speech, disorganized thought process, perseverates on somatic concerns, notably pressured speech with increased in rate.  He is alert and fully oriented however.  He demonstrates marked lack of insight and reports he wants to leave and go to another hospital.  He converses openly but requires redirection, is intermittently irritable.  He reports he hasn't been sleeping recently and outpatient neurology note from last month noted that he hadn't been sleeping for the previous 3 days, which is concerning.  He denies S/H ideation or A/V hallucinations and has no other concerns at this  time.  PAST PSYCHIATRIC HISTORY  Pt. reports regular outpatient psychiatric visits.  He denies previous psychiatric hospitalizations.  He reports he has been taking zoloft for the past few months, increased to 100mg  2-3 months ago but is unsure of response.  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  He attributes many of his previous issues to "family stress."  He cannot clarify whether any of these medications were helpful except notes that haldol may have been but he cannot recall due to amount of time elapsed.  He denies hx of suicide attempts or self-mutilation. Otherwise as per HPI above.  PAST MEDICAL HISTORY  Past Medical History:  Diagnosis Date   Anemia    Arthritis    rheumatoid   Asthma    Depression    GERD (gastroesophageal reflux disease)    History of COVID-19 03/29/2020   Hypertension 2019   Ischemic colitis (HCC) 09/2008   MSSA (methicillin susceptible Staphylococcus aureus)    sternaoclavicular absecess drainage   Osteoarthritis    sterum and right shoulder   PTSD (post-traumatic stress disorder)    Substance use disorder    Tachycardia      HOME MEDICATIONS  Facility Ordered Medications  Medication   [COMPLETED] ziprasidone (GEODON) injection 20 mg   [COMPLETED] ketorolac (TORADOL) 15 MG/ML injection 15 mg   [COMPLETED] prochlorperazine (COMPAZINE) injection 10 mg   [COMPLETED] diphenhydrAMINE (BENADRYL) injection 12.5 mg   [COMPLETED] iohexol (OMNIPAQUE) 350 MG/ML injection 75 mL   PTA Medications  Medication Sig   atorvastatin (LIPITOR) 10 MG tablet Take 10 mg by mouth at bedtime.    lisinopril (PRINIVIL,ZESTRIL) 10 MG tablet Take 10 mg by mouth daily.   omeprazole (PRILOSEC) 40 MG capsule Take 1 capsule by mouth every morning.   folic acid (FOLVITE) 1 MG tablet Take 1 mg by  mouth daily.   glipiZIDE-metformin (METAGLIP) 5-500 MG tablet Take 1 tablet by mouth daily.   allopurinol (ZYLOPRIM) 300 MG tablet Take 300 mg by mouth daily.   HUMIRA 40 MG/0.4ML PSKT Inject 40 mg into the skin once a week.   sertraline (ZOLOFT) 100 MG tablet Take 2 tablets (200 mg total) by mouth daily.   Cholecalciferol (VITAMIN D3) 50 MCG (2000 UT) TABS Take 1 tablet (50 mcg total) by mouth daily.   methotrexate (RHEUMATREX) 2.5 MG tablet Take 6 tablets by mouth once a week.   cloNIDine (CATAPRES) 0.2 MG tablet Take 1 tablet (0.2 mg total) by mouth 3 (three) times daily.   naproxen (NAPROSYN) 500 MG tablet Take 500 mg by mouth 2 (two) times daily as needed for moderate pain.   gabapentin (NEURONTIN) 100 MG capsule Take 1 capsule (100 mg total) by mouth 3 (three) times daily as needed.   mupirocin cream (BACTROBAN) 2 % Apply 1 Application topically 3 (three) times daily. Apply to the affected area of your left face   acyclovir (ZOVIRAX) 400 MG tablet Take 2 tablets (800 mg total) by mouth 5 (five) times daily.   amoxicillin-clavulanate (AUGMENTIN) 875-125 MG tablet Take 1 tablet by mouth every 12 (twelve) hours.    ALLERGIES  No Known Allergies  SOCIAL & SUBSTANCE USE HISTORY  Social History   Socioeconomic History   Marital status: Legally Separated    Spouse name: Not on file   Number of children: 0   Years of  education: Not on file   Highest education level: Some college, no degree  Occupational History   Not on file  Tobacco Use   Smoking status: Every Day    Current packs/day: 1.00    Average packs/day: 1 pack/day for 15.0 years (15.0 ttl pk-yrs)    Types: Cigarettes   Smokeless tobacco: Never  Vaping Use   Vaping status: Never Used  Substance and Sexual Activity   Alcohol use: Not Currently    Comment: rare beer   Drug use: Not Currently    Types: Marijuana    Comment: cough med 24 pills daily   Sexual activity: Not Currently  Other Topics Concern   Not on file   Social History Narrative   Not on file   Social Drivers of Health   Financial Resource Strain: Low Risk  (01/27/2019)   Received from [facility], [facility]   Overall Financial Resource Strain (CARDIA)    Difficulty of Paying Living Expenses: Not hard at all  Food Insecurity: Food Insecurity Present (06/20/2022)   Hunger Vital Sign    Worried About Running Out of Food in the Last Year: Sometimes true    Ran Out of Food in the Last Year: Sometimes true  Transportation Needs: No Transportation Needs (06/20/2022)   PRAPARE - Administrator, Civil Service (Medical): No    Lack of Transportation (Non-Medical): No  Physical Activity: Inactive (01/27/2019)   Received from [facility], [facility]   Exercise Vital Sign    Days of Exercise per Week: 0 days    Minutes of Exercise per Session: 0 min  Stress: No Stress Concern Present (01/27/2019)   Received from [facility], [facility] - Occupational Stress Questionnaire    Feeling of Stress : Not at all  Social Connections: Socially Isolated (01/27/2019)   Received from [facility], [facility]   Social Connection and Isolation Panel [NHANES]    Frequency of Communication with Friends and Family: More than three times a week    Frequency of Social Gatherings with Friends and Family: Once a week    Attends Religious Services: Never    Database administrator or Organizations: No    Attends Engineer, structural: Never    Marital Status: Never married   Social History   Tobacco Use  Smoking Status Every Day   Current packs/day: 1.00   Average packs/day: 1 pack/day for 15.0 years (15.0 ttl pk-yrs)   Types: Cigarettes  Smokeless Tobacco Never   Social History   Substance and Sexual Activity  Alcohol Use Not Currently   Comment: rare beer   Social History   Substance and Sexual Activity  Drug Use Not Currently   Types: Marijuana    Comment: cough med 24 pills daily    Additional pertinent information: reports he lives with his mother-he does not give consent for me to talk to her at this time.  He notes rare alcohol use, denies other substance use except daily tobacco.   Never married, no children.  FAMILY HISTORY  Family History  Problem Relation Age of Onset   Alcohol abuse Mother    Depression Mother    Drug abuse Mother    Physical abuse Mother    Sexual abuse Mother    ADD / ADHD Paternal Aunt    Alcohol abuse Maternal Grandfather    Bipolar disorder Maternal Grandmother  Schizophrenia Maternal Grandmother    Drug abuse Maternal Grandmother    Alcohol abuse Paternal Grandfather    Depression Paternal Grandfather    Depression Paternal Grandmother    Drug abuse Paternal Grandmother    Family Psychiatric History (if known):  he reports maternal grandmother-schizophrenia, mother and father-depression and anxiety, possibly bipolar, denies family history of suicides or severe substance use issues.  MENTAL STATUS EXAM (MSE)  Mental Status Exam: General Appearance: Well Groomed  Orientation:  Full (Time, Place, and Person)  Memory:  Immediate;   Good Recent;   Good Remote;   Good  Concentration:  Concentration: Fair and Attention Span: Fair  Recall:  Good  Attention  Fair  Eye Contact:  Good  Speech:  Pressured  Language:  Good  Volume:  Normal  Mood: mildly irritable  Affect:  Constricted  Thought Process:  Disorganized  Thought Content:  Rumination  Suicidal Thoughts:  No  Homicidal Thoughts:  No  Judgement:  Impaired  Insight:  Lacking  Psychomotor Activity:  Normal  Akathisia:  No  Fund of Knowledge:  Good    Assets:  Housing Physical Health Social Support  Cognition:  WNL  ADL's:  Intact  AIMS (if indicated):       VITALS  Blood pressure (!) 137/97, pulse (!) 111, temperature 98.1 F (36.7 C), resp. rate 16, height 5\' 11"  (1.803 m), weight 120.2 kg, SpO2 97%.  LABS  Admission on  05/26/2023  Component Date Value Ref Range Status   Sodium 05/26/2023 132 (L)  135 - 145 mmol/L Final   Potassium 05/26/2023 3.9  3.5 - 5.1 mmol/L Final   Chloride 05/26/2023 98  98 - 111 mmol/L Final   CO2 05/26/2023 23  22 - 32 mmol/L Final   Glucose, Bld 05/26/2023 228 (H)  70 - 99 mg/dL Final   Glucose reference range applies only to samples taken after fasting for at least 8 hours.   BUN 05/26/2023 8  6 - 20 mg/dL Final   Creatinine, Ser 05/26/2023 0.80  0.61 - 1.24 mg/dL Final   Calcium 16/11/9602 9.6  8.9 - 10.3 mg/dL Final   Total Protein 54/10/8117 6.8  6.5 - 8.1 g/dL Final   Albumin 14/78/2956 3.7  3.5 - 5.0 g/dL Final   AST 21/30/8657 30  15 - 41 U/L Final   ALT 05/26/2023 33  0 - 44 U/L Final   Alkaline Phosphatase 05/26/2023 110  38 - 126 U/L Final   Total Bilirubin 05/26/2023 0.2  0.0 - 1.2 mg/dL Final   GFR, Estimated 05/26/2023 >60  >60 mL/min Final   Comment: (NOTE) Calculated using the CKD-EPI Creatinine Equation (2021)    Anion gap 05/26/2023 11  5 - 15 Final   Performed at [facility], [address] [place], [place] [address]   Alcohol, Ethyl (B) 05/26/2023 <10  <10 mg/dL Final   Comment: (NOTE) Lowest detectable limit for serum alcohol is 10 mg/dL.  For medical purposes only. Performed at [facility], [address], [place], [place] [address]    Opiates 05/26/2023 NONE DETECTED  NONE DETECTED Final   Cocaine 05/26/2023 NONE DETECTED  NONE DETECTED Final   Benzodiazepines 05/26/2023 NONE DETECTED  NONE DETECTED Final   Amphetamines 05/26/2023 NONE DETECTED  NONE DETECTED Final   Tetrahydrocannabinol 05/26/2023 NONE DETECTED  NONE DETECTED Final   Barbiturates 05/26/2023 NONE DETECTED  NONE DETECTED Final   Comment: (NOTE) DRUG SCREEN FOR MEDICAL PURPOSES ONLY.  IF CONFIRMATION IS NEEDED FOR ANY PURPOSE, NOTIFY  LAB WITHIN 5 DAYS.  LOWEST DETECTABLE LIMITS FOR URINE DRUG SCREEN Drug Class                     Cutoff (ng/mL) Amphetamine and metabolites     1000 Barbiturate and metabolites    200 Benzodiazepine                 200 Opiates and metabolites        300 Cocaine and metabolites        300 THC                            50 Performed at [facility], [address] [place], [place] [address]    WBC 05/26/2023 11.2 (H)  4.0 - 10.5 K/uL Final   RBC 05/26/2023 5.49  4.22 - 5.81 MIL/uL Final   Hemoglobin 05/26/2023 13.1  13.0 - 17.0 g/dL Final   HCT 60/45/4098 40.9  39.0 - 52.0 % Final   MCV 05/26/2023 74.5 (L)  80.0 - 100.0 fL Final   MCH 05/26/2023 23.9 (L)  26.0 - 34.0 pg Final   MCHC 05/26/2023 32.0  30.0 - 36.0 g/dL Final   RDW 11/91/4782 18.8 (H)  11.5 - 15.5 % Final   Platelets 05/26/2023 277  150 - 400 K/uL Final   nRBC 05/26/2023 0.0  0.0 - 0.2 % Final   Neutrophils Relative % 05/26/2023 71  % Final   Neutro Abs 05/26/2023 8.0 (H)  1.7 - 7.7 K/uL Final   Lymphocytes Relative 05/26/2023 19  % Final   Lymphs Abs 05/26/2023 2.1  0.7 - 4.0 K/uL Final   Monocytes Relative 05/26/2023 5  % Final   Monocytes Absolute 05/26/2023 0.5  0.1 - 1.0 K/uL Final   Eosinophils Relative 05/26/2023 5  % Final   Eosinophils Absolute 05/26/2023 0.5  0.0 - 0.5 K/uL Final   Basophils Relative 05/26/2023 0  % Final   Basophils Absolute 05/26/2023 0.0  0.0 - 0.1 K/uL Final   Immature Granulocytes 05/26/2023 0  % Final   Abs Immature Granulocytes 05/26/2023 0.04  0.00 - 0.07 K/uL Final   Performed at [facility], [address], [place], [place] [address]  Admission on 05/26/2023, Discharged on 05/26/2023  Component Date Value Ref Range Status   WBC 05/26/2023 10.3  4.0 - 10.5 K/uL Final   RBC 05/26/2023 5.21  4.22 - 5.81 MIL/uL Final   Hemoglobin 05/26/2023 12.6 (L)  13.0 - 17.0 g/dL Final   HCT 30/86/5784 39.0  39.0 - 52.0 % Final   MCV 05/26/2023 74.9 (L)  80.0 - 100.0 fL Final   MCH 05/26/2023 24.2 (L)  26.0 - 34.0 pg Final   MCHC 05/26/2023 32.3  30.0 - 36.0 g/dL Final   RDW 69/62/9528 18.5 (H)  11.5 - 15.5 % Final   Platelets  05/26/2023 270  150 - 400 K/uL Final   nRBC 05/26/2023 0.0  0.0 - 0.2 % Final   Neutrophils Relative % 05/26/2023 67  % Final   Neutro Abs 05/26/2023 6.9  1.7 - 7.7 K/uL Final   Lymphocytes Relative 05/26/2023 23  % Final   Lymphs Abs 05/26/2023 2.4  0.7 - 4.0 K/uL Final   Monocytes Relative 05/26/2023 5  % Final   Monocytes Absolute 05/26/2023 0.5  0.1 - 1.0 K/uL Final   Eosinophils Relative 05/26/2023 4  % Final   Eosinophils Absolute 05/26/2023 0.4  0.0 - 0.5  K/uL Final   Basophils Relative 05/26/2023 0  % Final   Basophils Absolute 05/26/2023 0.0  0.0 - 0.1 K/uL Final   Immature Granulocytes 05/26/2023 1  % Final   Abs Immature Granulocytes 05/26/2023 0.05  0.00 - 0.07 K/uL Final   Performed at [facility], [address] [place], [place] [address]   Sodium 05/26/2023 132 (L)  135 - 145 mmol/L Final   Potassium 05/26/2023 3.6  3.5 - 5.1 mmol/L Final   Chloride 05/26/2023 101  98 - 111 mmol/L Final   CO2 05/26/2023 20 (L)  22 - 32 mmol/L Final   Glucose, Bld 05/26/2023 255 (H)  70 - 99 mg/dL Final   Glucose reference range applies only to samples taken after fasting for at least 8 hours.   BUN 05/26/2023 7  6 - 20 mg/dL Final   Creatinine, Ser 05/26/2023 0.76  0.61 - 1.24 mg/dL Final   Calcium 69/62/9528 9.4  8.9 - 10.3 mg/dL Final   GFR, Estimated 05/26/2023 >60  >60 mL/min Final   Comment: (NOTE) Calculated using the CKD-EPI Creatinine Equation (2021)    Anion gap 05/26/2023 11  5 - 15 Final   Performed at [facility], [address], [place], [place] [address]   Alcohol, Ethyl (B) 05/26/2023 <10  <10 mg/dL Final   Comment: (NOTE) Lowest detectable limit for serum alcohol is 10 mg/dL.  For medical purposes only. Performed at [facility], [address] [place], [place] [address]     PSYCHIATRIC REVIEW OF SYSTEMS (ROS)  ROS: Notable for the following relevant positive findings: ROS  Additional findings:      Musculoskeletal: No abnormal movements observed      Gait &  Station: Laying/Sitting      Pain Screening: Denies      Nutrition & Dental Concerns: none  RISK FORMULATION/ASSESSMENT  Is the patient experiencing any suicidal or homicidal ideations: No       Explain if yes:  Protective factors considered for safety management: family support, previous medication response  Risk factors/concerns considered for safety management:  Impulsivity Unwillingness to seek help Male gender Unmarried  Is there a safety management plan with the patient and treatment team to minimize risk factors and promote protective factors: Yes           Explain: inpatient psychiatric treatment Is crisis care placement or psychiatric hospitalization recommended: Yes     Based on my current evaluation and risk assessment, patient is determined at this time to be at:  High risk  *RISK ASSESSMENT Risk assessment is a dynamic process; it is possible that this patient's condition, and risk level, may change. This should be re-evaluated and managed over time as appropriate. Please re-contact psychiatric consult services if additional assistance is needed in terms of risk assessment and management. If your team decides to discharge this patient, please advise the patient how to best access emergency psychiatric services, or to call 911, if their condition worsens or they feel unsafe in any way.   Alvan Jews, MD Telepsychiatry Consult Services

## 2023-05-27 ENCOUNTER — Encounter (HOSPITAL_COMMUNITY): Payer: Self-pay | Admitting: Psychiatric/Mental Health

## 2023-05-27 ENCOUNTER — Inpatient Hospital Stay (HOSPITAL_COMMUNITY)
Admission: AD | Admit: 2023-05-27 | Discharge: 2023-05-30 | DRG: 882 | Disposition: A | Discharge status: Home / Self Care | Payer: MEDICAID | Source: Intra-hospital | Attending: Psychiatry | Admitting: Psychiatry

## 2023-05-27 DIAGNOSIS — F1120 Opioid dependence, uncomplicated: Secondary | ICD-10-CM | POA: Diagnosis present

## 2023-05-27 DIAGNOSIS — E119 Type 2 diabetes mellitus without complications: Secondary | ICD-10-CM | POA: Diagnosis present

## 2023-05-27 DIAGNOSIS — E785 Hyperlipidemia, unspecified: Secondary | ICD-10-CM | POA: Diagnosis present

## 2023-05-27 DIAGNOSIS — Z8616 Personal history of COVID-19: Secondary | ICD-10-CM

## 2023-05-27 DIAGNOSIS — F952 Tourette's disorder: Secondary | ICD-10-CM | POA: Diagnosis present

## 2023-05-27 DIAGNOSIS — F1920 Other psychoactive substance dependence, uncomplicated: Principal | ICD-10-CM

## 2023-05-27 DIAGNOSIS — Z91199 Patient's noncompliance with other medical treatment and regimen due to unspecified reason: Secondary | ICD-10-CM | POA: Diagnosis not present

## 2023-05-27 DIAGNOSIS — F6381 Intermittent explosive disorder: Secondary | ICD-10-CM | POA: Diagnosis present

## 2023-05-27 DIAGNOSIS — F4310 Post-traumatic stress disorder, unspecified: Principal | ICD-10-CM | POA: Diagnosis present

## 2023-05-27 DIAGNOSIS — Z811 Family history of alcohol abuse and dependence: Secondary | ICD-10-CM

## 2023-05-27 DIAGNOSIS — Z818 Family history of other mental and behavioral disorders: Secondary | ICD-10-CM

## 2023-05-27 DIAGNOSIS — M069 Rheumatoid arthritis, unspecified: Secondary | ICD-10-CM | POA: Diagnosis present

## 2023-05-27 DIAGNOSIS — F1924 Other psychoactive substance dependence with psychoactive substance-induced mood disorder: Secondary | ICD-10-CM | POA: Diagnosis not present

## 2023-05-27 DIAGNOSIS — E559 Vitamin D deficiency, unspecified: Secondary | ICD-10-CM | POA: Diagnosis present

## 2023-05-27 DIAGNOSIS — Z635 Disruption of family by separation and divorce: Secondary | ICD-10-CM

## 2023-05-27 DIAGNOSIS — Z813 Family history of other psychoactive substance abuse and dependence: Secondary | ICD-10-CM | POA: Diagnosis not present

## 2023-05-27 DIAGNOSIS — F17210 Nicotine dependence, cigarettes, uncomplicated: Secondary | ICD-10-CM | POA: Diagnosis present

## 2023-05-27 DIAGNOSIS — Z5986 Financial insecurity: Secondary | ICD-10-CM

## 2023-05-27 DIAGNOSIS — Z5941 Food insecurity: Secondary | ICD-10-CM

## 2023-05-27 DIAGNOSIS — I10 Essential (primary) hypertension: Secondary | ICD-10-CM | POA: Diagnosis present

## 2023-05-27 DIAGNOSIS — F3160 Bipolar disorder, current episode mixed, unspecified: Secondary | ICD-10-CM | POA: Diagnosis present

## 2023-05-27 DIAGNOSIS — K219 Gastro-esophageal reflux disease without esophagitis: Secondary | ICD-10-CM | POA: Diagnosis present

## 2023-05-27 DIAGNOSIS — Z79899 Other long term (current) drug therapy: Secondary | ICD-10-CM

## 2023-05-27 DIAGNOSIS — F1994 Other psychoactive substance use, unspecified with psychoactive substance-induced mood disorder: Secondary | ICD-10-CM | POA: Insufficient documentation

## 2023-05-27 LAB — FOLATE: Folate: 13.9 ng/mL (ref >5.9)

## 2023-05-27 MED ORDER — ALUM & MAG HYDROXIDE-SIMETH 200-200-20 MG/5ML PO SUSP: 30.0000 mL | ORAL | Status: DC | PRN

## 2023-05-27 MED ORDER — HALOPERIDOL 5 MG PO TABS: 5.0000 mg | ORAL_TABLET | Freq: Three times a day (TID) | ORAL | Status: DC | PRN

## 2023-05-27 MED ORDER — LORAZEPAM 2 MG/ML IJ SOLN: 2.0000 mg | Freq: Three times a day (TID) | INTRAMUSCULAR | Status: DC | PRN

## 2023-05-27 MED ORDER — PANTOPRAZOLE SODIUM 40 MG PO TBEC
80.0000 mg | DELAYED_RELEASE_TABLET | Freq: Every day | ORAL | Status: DC
  Administered 2023-05-27 – 2023-05-30 (×4): 80 mg via ORAL
  Filled 2023-05-27 (×7): qty 2

## 2023-05-27 MED ORDER — OLANZAPINE 10 MG PO TABS
10.0000 mg | ORAL_TABLET | Freq: Every day | ORAL | Status: DC
  Administered 2023-05-27: 10 mg via ORAL
  Filled 2023-05-27 (×2): qty 1

## 2023-05-27 MED ORDER — FLUOXETINE HCL 20 MG PO CAPS
20.0000 mg | ORAL_CAPSULE | Freq: Every day | ORAL | Status: DC
  Administered 2023-05-27 – 2023-05-30 (×4): 20 mg via ORAL
  Filled 2023-05-27 (×7): qty 1

## 2023-05-27 MED ORDER — DIPHENHYDRAMINE HCL 50 MG/ML IJ SOLN: 50.0000 mg | Freq: Three times a day (TID) | INTRAMUSCULAR | Status: DC | PRN

## 2023-05-27 MED ORDER — HYDROXYZINE HCL 25 MG PO TABS
25.0000 mg | ORAL_TABLET | Freq: Three times a day (TID) | ORAL | Status: DC | PRN
  Administered 2023-05-27 – 2023-05-30 (×7): 25 mg via ORAL
  Filled 2023-05-27 (×7): qty 1

## 2023-05-27 MED ORDER — MAGNESIUM HYDROXIDE 400 MG/5ML PO SUSP: 30.0000 mL | Freq: Every day | ORAL | Status: DC | PRN

## 2023-05-27 MED ORDER — HALOPERIDOL LACTATE 5 MG/ML IJ SOLN: 10.0000 mg | Freq: Three times a day (TID) | INTRAMUSCULAR | Status: DC | PRN

## 2023-05-27 MED ORDER — ACETAMINOPHEN 325 MG PO TABS
650.0000 mg | ORAL_TABLET | Freq: Four times a day (QID) | ORAL | Status: DC | PRN
  Administered 2023-05-29: 650 mg via ORAL
  Filled 2023-05-27: qty 2

## 2023-05-27 MED ORDER — METFORMIN HCL 500 MG PO TABS
500.0000 mg | ORAL_TABLET | Freq: Two times a day (BID) | ORAL | Status: DC
  Administered 2023-05-27 – 2023-05-30 (×6): 500 mg via ORAL
  Filled 2023-05-27 (×10): qty 1

## 2023-05-27 MED ORDER — LISINOPRIL 10 MG PO TABS
10.0000 mg | ORAL_TABLET | Freq: Every day | ORAL | Status: DC
  Administered 2023-05-27 – 2023-05-30 (×4): 10 mg via ORAL
  Filled 2023-05-27 (×2): qty 1
  Filled 2023-05-27: qty 2
  Filled 2023-05-27 (×4): qty 1

## 2023-05-27 MED ORDER — GLIPIZIDE 5 MG PO TABS
5.0000 mg | ORAL_TABLET | Freq: Two times a day (BID) | ORAL | Status: DC
  Administered 2023-05-27 – 2023-05-30 (×6): 5 mg via ORAL
  Filled 2023-05-27 (×10): qty 1

## 2023-05-27 MED ORDER — TRAZODONE HCL 50 MG PO TABS
50.0000 mg | ORAL_TABLET | Freq: Every evening | ORAL | Status: DC | PRN
  Administered 2023-05-27 – 2023-05-29 (×3): 50 mg via ORAL
  Filled 2023-05-27 (×3): qty 1

## 2023-05-27 MED ORDER — CLONIDINE HCL 0.2 MG PO TABS
0.2000 mg | ORAL_TABLET | Freq: Three times a day (TID) | ORAL | Status: DC
  Administered 2023-05-27 – 2023-05-30 (×9): 0.2 mg via ORAL
  Filled 2023-05-27 (×4): qty 1
  Filled 2023-05-27: qty 2
  Filled 2023-05-27 (×10): qty 1

## 2023-05-27 MED ORDER — VITAMIN D3 25 MCG PO TABS
1000.0000 [IU] | ORAL_TABLET | Freq: Every day | ORAL | Status: DC
  Administered 2023-05-27 – 2023-05-30 (×4): 1000 [IU] via ORAL
  Filled 2023-05-27 (×7): qty 1

## 2023-05-27 MED ORDER — HALOPERIDOL LACTATE 5 MG/ML IJ SOLN: 5.0000 mg | Freq: Three times a day (TID) | INTRAMUSCULAR | Status: DC | PRN

## 2023-05-27 MED ORDER — DIPHENHYDRAMINE HCL 25 MG PO CAPS: 50.0000 mg | ORAL_CAPSULE | Freq: Three times a day (TID) | ORAL | Status: DC | PRN

## 2023-05-27 NOTE — Progress Notes (Signed)
 Patient admitted from [facility] ED for increased agitation/aggression. Per patient he was home experiencing increased pain radiating from the back of his head/neck area to his forehead at midnight. At 2am the pain had become unbearable, and patient's mother encouraged him to call EMS, where he was transferred to ED. Per patient, the pain had been present for months, but worsened last night. He denies injury to his head or neck, denies headache stating "I don't know what those are." Denies current stressors or He denies alcohol/drug use other that THC. Endorses history of abuse of cough medication tablets. He currently denies SI/HI, AVH or history. Patient currently unemployed and lives with mother who names as his support. Patient has protrusion to left elbow from arthritis. Patient was logical/coherent, notably anxious and fidgety during assessment. He remained preoccupied with discharge and pain to head/neck, yet when asked pain level, patient denied pain. Admission information discussed, understanding verbalized. Patient oriented to unit, shown to room.

## 2023-05-27 NOTE — Plan of Care (Signed)
  Problem: Activity: Goal: Interest or engagement in activities will improve Outcome: Progressing   Problem: Physical Regulation: Goal: Ability to maintain clinical measurements within normal limits will improve Outcome: Progressing   Problem: Safety: Goal: Periods of time without injury will increase Outcome: Progressing

## 2023-05-27 NOTE — [facility] Suicide Risk Assessment (Signed)
 [facility] Admission Suicide Risk Assessment   Nursing information obtained from:  Patient Demographic factors:  Male, Low socioeconomic status, Unemployed, Caucasian Current Mental Status:  NA Loss Factors:  Decline in physical health Historical Factors:  NA Risk Reduction Factors:  Living with another person, especially a relative  Total Time spent with patient: 1 hour Principal Problem: Dextromethorphan use disorder, severe, dependence (HCC) Diagnosis:  Principal Problem:   Dextromethorphan use disorder, severe, dependence (HCC) Active Problems:   PTSD (post-traumatic stress disorder)   Bipolar disorder, current episode mixed, unspecified (HCC)   Substance induced mood disorder (HCC)  Subjective Data: see H&P  Continued Clinical Symptoms:  Alcohol Use Disorder Identification Test Final Score (AUDIT): 2 The "Alcohol Use Disorders Identification Test", Guidelines for Use in Primary Care, Second Edition.  World Science writer [facility]). Score between 0-7:  no or low risk or alcohol related problems. Score between 8-15:  moderate risk of alcohol related problems. Score between 16-19:  high risk of alcohol related problems. Score 20 or above:  warrants further diagnostic evaluation for alcohol dependence and treatment.   CLINICAL FACTORS:   Depression:   Hopelessness Chronic Pain Unstable or Poor Therapeutic Relationship   Musculoskeletal: Strength & Muscle Tone: within normal limits Gait & Station: normal Patient leans: N/A  Psychiatric Specialty Exam:  Presentation  General Appearance:  Disheveled  Eye Contact: Poor  Speech: Garbled  Speech Volume: Increased  Handedness:No data recorded  Mood and Affect  Mood: Anxious; Irritable  Affect: Constricted   Thought Process  Thought Processes: Goal Directed  Descriptions of Associations:No data recorded Orientation:Full (Time, Place and Person)  Thought Content:WDL  History of  Schizophrenia/Schizoaffective disorder:No  Duration of Psychotic Symptoms:No data recorded Hallucinations:Hallucinations: None  Ideas of Reference:None  Suicidal Thoughts:Suicidal Thoughts: No  Homicidal Thoughts:Homicidal Thoughts: No   Sensorium  Memory: Immediate Poor  Judgment: Poor  Insight: Poor   Executive Functions  Concentration: Fair  Attention Span: Fair  Recall: Fair  Fund of Knowledge: Fair  Language: Fair   Psychomotor Activity  Psychomotor Activity: Psychomotor Activity: Increased   Assets  Assets:No data recorded  Sleep  Sleep: Sleep: Poor    Physical Exam: Physical Exam ROS Blood pressure (!) 145/90, pulse (!) 114, temperature 98.5 F (36.9 C), temperature source Oral, resp. rate 16, height 5\' 11"  (1.803 m), weight 112 kg, SpO2 100%. Body mass index is 34.45 kg/m.   COGNITIVE FEATURES THAT CONTRIBUTE TO RISK:  Closed-mindedness and Loss of executive function    SUICIDE RISK:   Moderate:  Frequent suicidal ideation with limited intensity, and duration, some specificity in terms of plans, no associated intent, good self-control, limited dysphoria/symptomatology, some risk factors present, and identifiable protective factors, including available and accessible social support.  PLAN OF CARE: admit to inpatient psychiatry, see H&P  I certify that inpatient services furnished can reasonably be expected to improve the patient's condition.   [name], MD 05/27/2023, 2:44 PM

## 2023-05-27 NOTE — Progress Notes (Signed)
   05/27/23 2100  Psych Admission Type (Psych Patients Only)  Admission Status Voluntary  Psychosocial Assessment  Patient Complaints Anxiety  Eye Contact Brief  Facial Expression Blank;Anxious  Affect Appropriate to circumstance  Speech Logical/coherent  Interaction Assertive  Motor Activity Fidgety;Restless  Appearance/Hygiene Body odor;Disheveled  Behavior Characteristics Anxious;Restless  Mood Labile;Suspicious  Aggressive Behavior  Effect No apparent injury  Thought Process  Coherency Circumstantial  Content Blaming others  Delusions None reported or observed  Perception WDL  Hallucination None reported or observed  Judgment Limited  Confusion None  Danger to Self  Current suicidal ideation? Denies  Danger to Others  Danger to Others None reported or observed

## 2023-05-27 NOTE — Tx Team (Signed)
 Initial Treatment Plan 05/27/2023 2:41 AM Herbert Walsh [id number]    PATIENT STRESSORS: Health problems     PATIENT STRENGTHS: Supportive family/friends    PATIENT IDENTIFIED PROBLEMS: Pain to head/neck                      DISCHARGE CRITERIA:  Ability to meet basic life and health needs  PRELIMINARY DISCHARGE PLAN:   PATIENT/FAMILY INVOLVEMENT: This treatment plan has been presented to and reviewed with the patient, Herbert Walsh, and/or family member. The patient and family have been given the opportunity to ask questions and make suggestions.  Geri Ko, RN 05/27/2023, 2:41 AM

## 2023-05-27 NOTE — Group Note (Signed)
 Date:  05/27/2023 Time:  8:36 PM  Group Topic/Focus:  Wrap-Up Group:   The focus of this group is to help patients review their daily goal of treatment and discuss progress on daily workbooks.    Participation Level:  Did Not Attend  Cecile Gillispie Dacosta 05/27/2023, 8:36 PM

## 2023-05-27 NOTE — H&P (Signed)
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 unspecified (HCC)   Substance induced mood disorder (HCC)  Long Term Goal(s): Improvement in symptoms so as ready for discharge  Short Term Goals: Ability to identify changes in lifestyle to reduce recurrence of condition will improve, Ability to verbalize feelings will improve, Ability to disclose and discuss suicidal ideas, Ability to demonstrate self-control will improve, Ability to identify and develop effective coping behaviors will improve, Ability to maintain clinical measurements within normal limits will improve, Compliance with prescribed medications will improve, and Ability to identify triggers associated with substance abuse/mental health issues will improve  I certify that inpatient services furnished can reasonably be expected to improve the patient's condition.    Akyah Lagrange,  MD 4/13/20252:41 PM

## 2023-05-28 ENCOUNTER — Encounter (HOSPITAL_COMMUNITY): Payer: Self-pay

## 2023-05-28 MED ORDER — OLANZAPINE 7.5 MG PO TABS
15.0000 mg | ORAL_TABLET | Freq: Every day | ORAL | Status: DC
  Administered 2023-05-28 – 2023-05-29 (×2): 15 mg via ORAL
  Filled 2023-05-28 (×4): qty 2

## 2023-05-28 NOTE — Progress Notes (Signed)
   05/28/23 2100  Psych Admission Type (Psych Patients Only)  Admission Status Voluntary  Psychosocial Assessment  Eye Contact Brief  Facial Expression Blank;Anxious  Affect Appropriate to circumstance  Speech Logical/coherent  Interaction Assertive  Motor Activity Fidgety;Restless  Appearance/Hygiene Body odor;Disheveled  Behavior Characteristics Irritable  Mood Suspicious  Aggressive Behavior  Effect No apparent injury  Thought Process  Coherency Circumstantial  Content Blaming others  Delusions None reported or observed  Perception WDL  Hallucination None reported or observed  Judgment Limited  Confusion None  Danger to Self  Current suicidal ideation? Denies  Danger to Others  Danger to Others None reported or observed
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
## 2023-05-28 NOTE — Progress Notes (Signed)
 Dar Note: Patient presents with irritable affect and mood.  Reports poor night sleep.  Denies suicidal thoughts, auditory and visual hallucinations.  Medications given as prescribed.  Routine safety checks maintained.  Patient is safe on and off the unit.

## 2023-05-28 NOTE — Group Note (Signed)
 Recreation Therapy Group Note   Group Topic:Coping Skills  Group Date: 05/28/2023 Start Time: 1020 End Time: 1050 Facilitators: Jaiya Mooradian-McCall, LRT,CTRS Location: [address] Dayroom   Group Topic: Coping Skills   Goal Area(s) Addresses: Patient will define what a coping skill is. Patient will create a list of healthy coping skills beginning with each letter of the alphabet. Patient will successfully identify positive coping skills they can use post d/c.  Patient will acknowledge benefit(s) of using learned coping skills post d/c.  Intervention: Group work   Activity: Coping A to Z. Patient asked to identify what a coping skill is and when they use them. Patients with Clinical research associate discussed healthy versus unhealthy coping skills. Next patients were given a blank worksheet titled "Coping Skills A-Z". Patients were instructed to come up with at least one positive coping skill per letter of the alphabet, addressing a specific challenge (ex: stress, anger, anxiety, depression, grief, doubt, isolation, self-harm/suicidal thoughts, substance use). Patients were given 15 minutes to brainstorm, before ideas were presented to the large group. Patients and LRT debriefed on the importance of coping skill selection based on situation and back-up plans when a skill tried is not effective. At the end of group, patients were given an handout of alphabetized strategies to keep for future reference.   Education: Pharmacologist, Scientist, physiological, Discharge Planning.    Education Outcome: Acknowledges education/Verbalizes understanding/In group clarification offered/Additional education needed   Affect/Mood: N/A   Participation Level: Did not attend    Clinical Observations/Individualized Feedback:     Plan: Continue to engage patient in RT group sessions 2-3x/week.   Franz Svec-McCall, LRT,CTRS 05/28/2023 1:55 PM

## 2023-05-28 NOTE — BH IP Treatment Plan (Addendum)
 Interdisciplinary Treatment and Diagnostic Plan Update  05/28/2023 Time of Session: 1410 Herbert Walsh MRN: [id number]  Principal Diagnosis: Dextromethorphan use disorder, severe, dependence (HCC)  Secondary Diagnoses: Principal Problem:   Dextromethorphan use disorder, severe, dependence (HCC) Active Problems:   PTSD (post-traumatic stress disorder)   Bipolar disorder, current episode mixed, unspecified (HCC)   Substance induced mood disorder (HCC)   Current Medications:  Current Facility-Administered Medications  Medication Dose Route Frequency Provider Last Rate Last Admin   acetaminophen (TYLENOL) tablet 650 mg  650 mg Oral Q6H PRN Dixon, Rashaun M, NP       alum & mag hydroxide-simeth (MAALOX/MYLANTA) 200-200-20 MG/5ML suspension 30 mL  30 mL Oral Q4H PRN Dixon, Rashaun M, NP       cloNIDine (CATAPRES) tablet 0.2 mg  0.2 mg Oral TID Zouev, Dmitri, MD   0.2 mg at 05/28/23 1620   haloperidol (HALDOL) tablet 5 mg  5 mg Oral TID PRN Al Alias, NP       And   diphenhydrAMINE (BENADRYL) capsule 50 mg  50 mg Oral TID PRN Al Alias, NP       haloperidol lactate (HALDOL) injection 5 mg  5 mg Intramuscular TID PRN Al Alias, NP       And   diphenhydrAMINE (BENADRYL) injection 50 mg  50 mg Intramuscular TID PRN Al Alias, NP       And   LORazepam (ATIVAN) injection 2 mg  2 mg Intramuscular TID PRN Al Alias, NP       haloperidol lactate (HALDOL) injection 10 mg  10 mg Intramuscular TID PRN Al Alias, NP       And   diphenhydrAMINE (BENADRYL) injection 50 mg  50 mg Intramuscular TID PRN Al Alias, NP       And   LORazepam (ATIVAN) injection 2 mg  2 mg Intramuscular TID PRN Dixon, Rashaun M, NP       FLUoxetine (PROZAC) capsule 20 mg  20 mg Oral Daily Zouev, Dmitri, MD   20 mg at 05/28/23 0817   glipiZIDE (GLUCOTROL) tablet 5 mg  5 mg Oral BID AC Zouev, Dmitri, MD   5 mg at 05/28/23 1620   hydrOXYzine (ATARAX) tablet 25 mg  25 mg Oral TID  PRN Al Alias, NP   25 mg at 05/28/23 1625   lisinopril (ZESTRIL) tablet 10 mg  10 mg Oral Daily Zouev, Dmitri, MD   10 mg at 05/28/23 0817   magnesium hydroxide (MILK OF MAGNESIA) suspension 30 mL  30 mL Oral Daily PRN Al Alias, NP       metFORMIN (GLUCOPHAGE) tablet 500 mg  500 mg Oral BID WC Zouev, Dmitri, MD   500 mg at 05/28/23 1620   OLANZapine (ZYPREXA) tablet 15 mg  15 mg Oral QHS Zouev, Dmitri, MD       pantoprazole (PROTONIX) EC tablet 80 mg  80 mg Oral Daily Zouev, Dmitri, MD   80 mg at 05/28/23 0816   traZODone (DESYREL) tablet 50 mg  50 mg Oral QHS PRN Al Alias, NP   50 mg at 05/27/23 2107   vitamin D3 (CHOLECALCIFEROL) tablet 1,000 Units  1,000 Units Oral Daily Zouev, Dmitri, MD   1,000 Units at 05/28/23 0816   PTA Medications: Medications Prior to Admission  Medication Sig Dispense Refill Last Dose/Taking   allopurinol (ZYLOPRIM) 300 MG tablet Take 300 mg by mouth daily.   Past Week   atorvastatin (LIPITOR) 10  MG tablet Take 10 mg by mouth at bedtime.   12 Past Week   Chlorphen-PE-Acetaminophen (CORICIDIN D COLD/FLU/SINUS PO) Take 1 tablet by mouth as needed (per mom abuses this medication- Coricidin with dextromethorphan in it).   Taking As Needed   Cholecalciferol (VITAMIN D3) 50 MCG (2000 UT) TABS Take 1 tablet (50 mcg total) by mouth daily. 30 tablet 2 Past Week   cloNIDine (CATAPRES) 0.2 MG tablet Take 1 tablet (0.2 mg total) by mouth 3 (three) times daily. 270 tablet 0 Past Week   folic acid (FOLVITE) 1 MG tablet Take 1 mg by mouth daily.   Past Week   glipiZIDE-metformin (METAGLIP) 5-500 MG tablet Take 1 tablet by mouth daily.   Past Week   lisinopril (PRINIVIL,ZESTRIL) 10 MG tablet Take 10 mg by mouth daily.   Past Week   methotrexate (RHEUMATREX) 2.5 MG tablet Take 6 tablets by mouth once a week.   Past Month   omeprazole (PRILOSEC) 40 MG capsule Take 1 capsule by mouth every morning.   Past Week   sertraline (ZOLOFT) 100 MG tablet Take 100 mg by  mouth daily.   Past Week   Vitamin D, Ergocalciferol, (DRISDOL) 1.25 MG (50000 UNIT) CAPS capsule Take 50,000 Units by mouth once a week.   Taking   HUMIRA 40 MG/0.4ML PSKT Inject 40 mg into the skin once a week. (Patient not taking: Reported on 05/27/2023)   Not Taking   mupirocin cream (BACTROBAN) 2 % Apply 1 Application topically 3 (three) times daily. Apply to the affected area of your left face (Patient taking differently: Apply 1 Application topically 3 (three) times daily as needed. For sores/rash) 15 g 0     Patient Stressors: Health problems    Patient Strengths: Supportive family/friends   Treatment Modalities: Medication Management, Group therapy, Case management,  1 to 1 session with clinician, Psychoeducation, Recreational therapy.   Physician Treatment Plan for Primary Diagnosis: Dextromethorphan use disorder, severe, dependence (HCC) Long Term Goal(s): Improvement in symptoms so as ready for discharge   Short Term Goals: Ability to identify changes in lifestyle to reduce recurrence of condition will improve Ability to verbalize feelings will improve Ability to disclose and discuss suicidal ideas Ability to demonstrate self-control will improve Ability to identify and develop effective coping behaviors will improve Ability to maintain clinical measurements within normal limits will improve Compliance with prescribed medications will improve Ability to identify triggers associated with substance abuse/mental health issues will improve  Medication Management: Evaluate patient's response, side effects, and tolerance of medication regimen.  Therapeutic Interventions: 1 to 1 sessions, Unit Group sessions and Medication administration.  Evaluation of Outcomes: Not Progressing  Physician Treatment Plan for Secondary Diagnosis: Principal Problem:   Dextromethorphan use disorder, severe, dependence (HCC) Active Problems:   PTSD (post-traumatic stress disorder)   Bipolar  disorder, current episode mixed, unspecified (HCC)   Substance induced mood disorder (HCC)  Long Term Goal(s): Improvement in symptoms so as ready for discharge   Short Term Goals: Ability to identify changes in lifestyle to reduce recurrence of condition will improve Ability to verbalize feelings will improve Ability to disclose and discuss suicidal ideas Ability to demonstrate self-control will improve Ability to identify and develop effective coping behaviors will improve Ability to maintain clinical measurements within normal limits will improve Compliance with prescribed medications will improve Ability to identify triggers associated with substance abuse/mental health issues will improve     Medication Management: Evaluate patient's response, side effects, and tolerance of medication regimen.  Therapeutic Interventions: 1 to 1 sessions, Unit Group sessions and Medication administration.  Evaluation of Outcomes: Not Progressing   RN Treatment Plan for Primary Diagnosis: Dextromethorphan use disorder, severe, dependence (HCC) Long Term Goal(s): Knowledge of disease and therapeutic regimen to maintain health will improve  Short Term Goals: Ability to remain free from injury will improve, Ability to verbalize frustration and anger appropriately will improve, Ability to demonstrate self-control, Ability to participate in decision making will improve, Ability to verbalize feelings will improve, Ability to identify and develop effective coping behaviors will improve, and Compliance with prescribed medications will improve  Medication Management: RN will administer medications as ordered by provider, will assess and evaluate patient's response and provide education to patient for prescribed medication. RN will report any adverse and/or side effects to prescribing provider.  Therapeutic Interventions: 1 on 1 counseling sessions, Psychoeducation, Medication administration, Evaluate responses  to treatment, Monitor vital signs and CBGs as ordered, Perform/monitor CIWA, COWS, AIMS and Fall Risk screenings as ordered, Perform wound care treatments as ordered.  Evaluation of Outcomes: Not Progressing   LCSW Treatment Plan for Primary Diagnosis: Dextromethorphan use disorder, severe, dependence (HCC) Long Term Goal(s): Safe transition to appropriate next level of care at discharge, Engage patient in therapeutic group addressing interpersonal concerns.  Short Term Goals: Engage patient in aftercare planning with referrals and resources, Increase social support, Increase ability to appropriately verbalize feelings, Facilitate acceptance of mental health diagnosis and concerns, Identify triggers associated with mental health/substance abuse issues, and Increase skills for wellness and recovery  Therapeutic Interventions: Assess for all discharge needs, 1 to 1 time with Social worker, Explore available resources and support systems, Assess for adequacy in community support network, Educate family and significant other(s) on suicide prevention, Complete Psychosocial Assessment, Interpersonal group therapy.  Evaluation of Outcomes: Not Progressing   Progress in Treatment: Attending groups: No. Participating in groups: No. Taking medication as prescribed: Yes. Toleration medication: Yes. Family/Significant other contact made: No, will contact:  Judi Saa [phone] Patient understands diagnosis: Yes. Discussing patient identified problems/goals with staff: Yes. Medical problems stabilized or resolved: Yes. Denies suicidal/homicidal ideation: Yes. Issues/concerns per patient self-inventory: No. Other: N/a  New problem(s) identified: No, Describe:  None  New Short Term/Long Term Goal(s): detox, medication management for mood stabilization; elimination of SI thoughts; development of comprehensive mental wellness/sobriety plan  Patient Goals: "To get out of here"  Discharge Plan or  Barriers: Patient recently admitted. CSW will continue to follow and assess for appropriate referrals and possible discharge planning.   Reason for Continuation of Hospitalization: Medication stabilization Withdrawal symptoms Other; describe Mood stabilization, discharge planning  Estimated Length of Stay: 3-5 DAYS  Last 3 [place] Suicide Severity Risk Score: Flowsheet Row Ad[facility] (Current) [facility] from 05/26/2023 in [facility] Department at [facility] ED from [facility]04/20[facility]  C-SSRS RISK CATEGORY No Risk No Risk No Risk       Last [facility] 2/9 Scores:    03/21/2022   11:30 AM 01/24/2022   10:46 AM 10/15/2020   10:49 AM  Depression screen PHQ 2/9  Decreased Interest 1 1 1   Down, Depressed, Hopeless 2 3 1   PHQ - 2 Score 3 4 2   Altered sleeping 0 0 0  Tired, decreased energy 3 3 3   Change in appetite 0 0 0  Feeling bad or failure about yourself  1 3 1   Trouble concentrating 3 3  0  Moving slowly or fidgety/restless 0 3 1  Suicidal thoughts 0 2 0  PHQ-9 Score 10 18 7   Difficult doing work/chores  Very difficult Somewhat difficult    Scribe for Treatment Team: Vernestine Gondola, LCSW 05/28/2023 5:35 PM

## 2023-05-28 NOTE — Plan of Care (Signed)
   Problem: Education: Goal: Knowledge of [facility] Education information/materials will improve Outcome: Progressing Goal: Emotional status will improve Outcome: Progressing Goal: Mental status will improve Outcome: Progressing Goal: Verbalization of understanding the information provided will improve Outcome: Progressing

## 2023-05-28 NOTE — Group Note (Signed)
 LCSW Group Therapy Note   Group Date: 05/28/2023 Start Time: 1100 End Time: 1200   Participation:  did not attend  Type of Therapy:  Group Therapy  Topic:  Stronger Together:  Building Health Relationships  Objective:  to explore loneliness, boundaries, and safe ways to build relationships.  Goals: Recognize healthy vs. unhealthy relationships. Learn safe ways to connect with others. Strengthen communication and [employer].  Summary:  Participants discussed loneliness, healthy connections, and setting boundaries. They explored safe ways to meet people and shared personal experiences. Key insights were reinforced through discussion and quotes.  Therapeutic Modalities Used: Cognitive Behavioral Therapy (CBT) Elements - Identifying unhealthy relationship patterns, challenging negative thoughts about connection. Dialectical Behavior Therapy (DBT) Elements - Interpersonal effectiveness, setting and maintaining boundaries. Supportive Group Therapy - Peer discussion, shared experiences, and emotional validation.   Herbert Walsh, LCSWA 05/28/2023  5:15 PM

## 2023-05-28 NOTE — [facility] Group Notes (Signed)
 Adult Psychoeducational Group Note  Date:  05/28/2023 Time:  11:21 AM  Group Topic/Focus:  Goals Group:   The focus of this group is to help patients establish daily goals to achieve during treatment and discuss how the patient can incorporate goal setting into their daily lives to aide in recovery.  Participation Level:  Did Not Attend  Participation Quality:  NA  Affect:  NA  Cognitive:  NA  Insight: NA  Engagement in Group:  NA  Modes of Intervention:  NA  Additional Comments:  Pt did not attend group    Alean Amen 05/28/2023, 11:21 AM

## 2023-05-28 NOTE — Group Note (Signed)
 Date:  05/28/2023 Time:  8:19 PM  Group Topic/Focus:  Wrap-Up Group:   The focus of this group is to help patients review their daily goal of treatment and discuss progress on daily workbooks.    Participation Level:  Did Not Attend  Marjean Imperato Dacosta 05/28/2023, 8:19 PM

## 2023-05-28 NOTE — Plan of Care (Signed)
   Problem: Education: Goal: Emotional status will improve Outcome: Progressing Goal: Mental status will improve Outcome: Progressing Goal: Verbalization of understanding the information provided will improve Outcome: Progressing

## 2023-05-29 DIAGNOSIS — F1920 Other psychoactive substance dependence, uncomplicated: Secondary | ICD-10-CM | POA: Diagnosis not present

## 2023-05-29 NOTE — [facility] Suicide Risk Assessment (Signed)
 [facility] INPATIENT:  Family/Significant Other Suicide Prevention Education  Suicide Prevention Education:  Education Completed; [place] Coll (mom) [phone],  (name of family member/significant other) has been identified by the patient as the family member/significant other with whom the patient will be residing, and identified as the person(s) who will aid the patient in the event of a mental health crisis (suicidal ideations/suicide attempt).  With written consent from the patient, the family member/significant other has been provided the following suicide prevention education, prior to the and/or following the discharge of the patient.  Mom said that they don't have any guns or weapons.  Mom said she secured medications.  Mom said she will secure sharp objects, such as knives or scissors.  Mom is concerned about patient's emotional regulation:  when he hears "no," he gets upset like a 33 year old and becomes verbally aggressive towards himself or her.  The following people live in the home:  patient and mom.  Mom said that patient has a history of drug use and gambling, struggles with with managing money, doesn't have friends, has a hard time getting along with people, doesn't have any hobbies, doesn't want to watch TV and is sad.  Mom said that patient would benefit from attending a day program, but she is certain he will not be interested in it.  Mom said that patient doesn't have any friends,   The suicide prevention education provided includes the following: Suicide risk factors Suicide prevention and interventions National Suicide Hotline telephone number [facility] assessment telephone number [facility] Emergency Assistance 911 [facility] and/or Residential Mobile Crisis Unit telephone number  Request made of family/significant other to: Remove weapons (e.g., guns, rifles, knives), all items previously/currently identified as safety concern.   Remove  drugs/medications (over-the-counter, prescriptions, illicit drugs), all items previously/currently identified as a safety concern.  The family member/significant other verbalizes understanding of the suicide prevention education information provided.  The family member/significant other agrees to remove the items of safety concern listed above.  Shaila Gilchrest O Kenyona Rena, LCSWA 05/29/2023, 2:38 PM

## 2023-05-29 NOTE — Plan of Care (Signed)
°  Problem: Education: Goal: Mental status will improve Outcome: Progressing   Problem: Safety: Goal: Periods of time without injury will increase Outcome: Progressing   

## 2023-05-29 NOTE — Progress Notes (Signed)
   05/29/23 1400  Psych Admission Type (Psych Patients Only)  Admission Status Voluntary  Psychosocial Assessment  Patient Complaints Anxiety  Eye Contact Brief  Facial Expression Anxious  Affect Appropriate to circumstance  Speech Logical/coherent  Interaction Assertive  Motor Activity Slow  Appearance/Hygiene Disheveled  Behavior Characteristics Cooperative  Mood Anxious  Thought Process  Coherency Circumstantial  Content Blaming others  Delusions None reported or observed  Perception WDL  Hallucination None reported or observed  Judgment Impaired  Confusion None  Danger to Self  Current suicidal ideation? Denies  Danger to Others  Danger to Others None reported or observed

## 2023-05-29 NOTE — Group Note (Signed)
 Recreation Therapy Group Note   Group Topic:Self-Esteem  Group Date: 05/29/2023 Start Time: 1010 End Time: 1030 Facilitators: Kenzie Flakes-McCall, LRT,CTRS Location: [address] Dayroom   Group Topic: Self Esteem    Goal Area(s) Addresses:  Patient will appropriately identify what self esteem is.  Patient will create a shield of armor describing themselves.  Patient will successfully identify positive attributes about themselves.  Patient will acknowledge benefit of improved self-esteem.    Intervention / Activity: Self-Esteem Shield. Patient attended a recreation therapy group session focused on self esteem. Patient identified what self esteem is, and why it is important to have high self esteem during group discussion. Patient was asked to show off their unique attributes, the four quadrants reflected the following:   The Upper Left quadrant- 2 things or people you value The Upper Right quadrant- 2 lessons you've learned thus far in life The Lower Left quadrant- 3 qualities that make you unique The Lower Right quadrant- 1 goal you want to work towards    Patients were provided sheets with the shield printed on them and colored pencils, markers and crayons to complete the activity.  Patients and writer had group related discussions while individually working on their activity.  Patients were debriefed on the importance of healthy self esteem and offered a handout for ways to increase self esteem.    Education: Self esteem, Communication, Positive self-talk, Discharge Planning   Education Outcome: Acknowledges education/Verbalizes understanding of Education/In group clarification offered/Needs further education   Affect/Mood: N/A   Participation Level: Did not attend    Clinical Observations/Individualized Feedback:     Plan: Continue to engage patient in RT group sessions 2-3x/week.   Ashford Clouse-McCall, LRT,CTRS 05/29/2023 11:45 AM

## 2023-05-29 NOTE — Progress Notes (Signed)
 [facility] MD Progress Note  05/29/2023 2:17 PM Herbert Walsh  MRN:  [id number]   Reason for Admission:   33 y/o M with PPH of intermittent explosive disorder, PTSD, psychosis, bipolar disorder, tourettes presenting to ER via police under IVC. After presentation he became agitated and threw himself on the floor, attempted to elope, required prn medication for agitation.  The patient is currently on Hospital Day 2.   Chart Review from last 24 hours:  The patient's chart was reviewed and nursing notes were reviewed. The patient's case was discussed in multidisciplinary team meeting. Per MAR as needed Atarax being used once or twice daily, as needed trazodone being used nightly since admission.  No as needed medication needed or given for agitation or aggression.  Information Obtained Today During Patient Interview: The patient was seen and evaluated on the unit. On assessment today the patient reports he was admitted for increased anxiety.  He denies currently feeling depressed or anxious but notes feeling irritable wanting to go home.  His mother visited the other night and he tells me that she irritated him and left.  He denies passive or active SI intention or plan he denies racing thoughts he denies HI or AVH he denies symptoms consistent with mania or hypomania, he reports fair sleep and appetite.  He denies side effect to current medication regimen and agrees to comply with him after discharge, does not appear sleepy and does not display any sign consistent with EPS or TD.   He presents linear and organized yet concrete, he is irritable wanting to discharge but redirectable.  He does tell me clearly that he cannot recall when was the last time he felt manic but it was at least a few months ago, he also denies any recent incidents of threatening harm to his mother with last time 2 months ago during an argument they had, he denies any history of legal charges secondary to aggression, he does admit to  using Coricidin since 2011 but confirms to me that last use was 1 and half months ago, he tells me his mother got alerted after she found him having some in his room but he tells me they are old and he did not use any recently.  He remains consistent regard that report.  He reports some history of overdosing on Coricidin to get high but denies any history of suicide attempts.  Unfortunately he refuses referral to inpatient rehab treatment for substance use or SA IOP referral but agrees to comply with medications and outpatient follow-up appointment with psychiatric provider and counselor.  I spoke to patient's mother who does agree with history information reported by patient above, she does tell me that he has history of stealing Coricidin in the past but denies any recent incident of stealing it or using it that she knows off.  She did confirm that last time he had threatened harm to her was about 2 months ago but reports him to be irritable with her when he does not get his way.  I did discuss with patient's mother that his current presentation given the information she provides does not imply he is meeting criteria for IVC given current presentation today and if he continues to do well plan is to discharge him tomorrow or Thursday, she agrees that he does not imply imminent harm to self or others.  I discussed with her his chronic harm risk remains moderate given chronic substance use and refusal for substance use rehab as well  as history of noncompliance with outpatient treatment.  Sleep  Sleep:Sleep: Fair   Principal Problem: Dextromethorphan use disorder, severe, dependence (HCC) Diagnosis: Principal Problem:   Dextromethorphan use disorder, severe, dependence (HCC) Active Problems:   PTSD (post-traumatic stress disorder)   Bipolar disorder, current episode mixed, unspecified (HCC)   Substance induced mood disorder (HCC)    Past Psychiatric History: See H&P  Past Medical History:  Past  Medical History:  Diagnosis Date   Anemia    Arthritis    rheumatoid   Asthma    Depression    GERD (gastroesophageal reflux disease)    History of COVID-19 03/29/2020   Hypertension 2019   Ischemic colitis (HCC) 09/2008   MSSA (methicillin susceptible Staphylococcus aureus)    sternaoclavicular absecess drainage   Osteoarthritis    sterum and right shoulder   PTSD (post-traumatic stress disorder)    Substance use disorder    Tachycardia     Past Surgical History:  Procedure Laterality Date   APPENDECTOMY     APPLICATION OF WOUND VAC Right 06/20/2019   Procedure: APPLICATION OF WOUND VAC;  Surgeon: Heriberto London, MD;  Location: [facility] OR;  Service: Vascular;  Laterality: Right;   APPLICATION OF WOUND VAC Right 06/23/2019   Procedure: APPLICATION OF WOUND VAC;  Surgeon: Heriberto London, MD;  Location[facility] OR;  Service: Thoracic;  Laterality: Right;   COLONOSCOPY     x 2   I & D EXTREMITY Right 05/31/2019   Procedure: IRRIGATION AND DEBRIDEMENT LOWER EXTREMITY;  Surgeon: Wes Hamman, MD;  Location: [facility] OR;  Service: Orthopedics;  Laterality: Right;   I & D EXTREMITY Right 06/20/2019   Procedure: DEBRIDEMENT OF STERNOCLAVICULAR JOINT ABSCESS;  Surgeon: Heriberto London, MD;  Location: [facility] OR;  Service: Vascular;  Laterality: Right;   INCISION AND DRAINAGE OF WOUND Right 04/12/2020   Procedure: excision of right sternoclavicular joint infection;  Surgeon: Thornell Flirt, DO;  Location: [facility];  Service: Plastics;  Laterality: Right;  45 min total   STERNAL WOUND DEBRIDEMENT Right 09/19/2019   Procedure: EXCISIONAL DEBRIDEMENT AND IRRIGATION OF RIGHT STERNOCLAVICULAR JOINT WOUND;  Surgeon: Heriberto London, MD;  Location: [facility] OR;  Service: Thoracic;  Laterality: Right;   TEE WITHOUT CARDIOVERSION N/A 06/05/2019   Procedure: TRANSESOPHAGEAL ECHOCARDIOGRAM (TEE);  Surgeon: Lenise Quince, MD;  Location: [facility] ENDOSCOPY;  Service: Cardiovascular;  Laterality: N/A;   WOUND  EXPLORATION Right 06/23/2019   Procedure: irrigation and clean out right shoulder;  Surgeon: Heriberto London, MD;  Location: [facility] OR;  Service: Thoracic;  Laterality: Right;   Family History:  Family History  Problem Relation Age of Onset   Alcohol abuse Mother    Depression Mother    Drug abuse Mother    Physical abuse Mother    Sexual abuse Mother    ADD / ADHD Paternal Aunt    Alcohol abuse Maternal Grandfather    Bipolar disorder Maternal Grandmother    Schizophrenia Maternal Grandmother    Drug abuse Maternal Grandmother    Alcohol abuse Paternal Grandfather    Depression Paternal Grandfather    Depression Paternal Grandmother    Drug abuse Paternal Grandmother    Family Psychiatric  History: See H&P Social History: See H&P  Sleep:  Fair  Appetite:  Fair  Current Medications: Current Facility-Administered Medications  Medication Dose Route Frequency Provider Last Rate Last Admin   acetaminophen (TYLENOL) tablet 650 mg  650 mg Oral Q6H PRN Dixon, Rashaun M,  NP       alum & mag hydroxide-simeth (MAALOX/MYLANTA) 200-200-20 MG/5ML suspension 30 mL  30 mL Oral Q4H PRN Dixon, Elray Buba, NP       cloNIDine (CATAPRES) tablet 0.2 mg  0.2 mg Oral TID Miguel Rota, MD   0.2 mg at 05/29/23 1143   haloperidol (HALDOL) tablet 5 mg  5 mg Oral TID PRN Jearld Lesch, NP       And   diphenhydrAMINE (BENADRYL) capsule 50 mg  50 mg Oral TID PRN Jearld Lesch, NP       haloperidol lactate (HALDOL) injection 5 mg  5 mg Intramuscular TID PRN Jearld Lesch, NP       And   diphenhydrAMINE (BENADRYL) injection 50 mg  50 mg Intramuscular TID PRN Jearld Lesch, NP       And   LORazepam (ATIVAN) injection 2 mg  2 mg Intramuscular TID PRN Jearld Lesch, NP       haloperidol lactate (HALDOL) injection 10 mg  10 mg Intramuscular TID PRN Jearld Lesch, NP       And   diphenhydrAMINE (BENADRYL) injection 50 mg  50 mg Intramuscular TID PRN Jearld Lesch, NP       And   LORazepam  (ATIVAN) injection 2 mg  2 mg Intramuscular TID PRN Jearld Lesch, NP       FLUoxetine (PROZAC) capsule 20 mg  20 mg Oral Daily Zouev, Dmitri, MD   20 mg at 05/29/23 0803   glipiZIDE (GLUCOTROL) tablet 5 mg  5 mg Oral BID AC Zouev, Dmitri, MD   5 mg at 05/29/23 1478   hydrOXYzine (ATARAX) tablet 25 mg  25 mg Oral TID PRN Jearld Lesch, NP   25 mg at 05/28/23 2047   lisinopril (ZESTRIL) tablet 10 mg  10 mg Oral Daily Zouev, Dmitri, MD   10 mg at 05/29/23 0803   magnesium hydroxide (MILK OF MAGNESIA) suspension 30 mL  30 mL Oral Daily PRN Jearld Lesch, NP       metFORMIN (GLUCOPHAGE) tablet 500 mg  500 mg Oral BID WC Zouev, Dmitri, MD   500 mg at 05/29/23 0803   OLANZapine (ZYPREXA) tablet 15 mg  15 mg Oral QHS Zouev, Dmitri, MD   15 mg at 05/28/23 2044   pantoprazole (PROTONIX) EC tablet 80 mg  80 mg Oral Daily Zouev, Dmitri, MD   80 mg at 05/29/23 0803   traZODone (DESYREL) tablet 50 mg  50 mg Oral QHS PRN Jearld Lesch, NP   50 mg at 05/28/23 2047   vitamin D3 (CHOLECALCIFEROL) tablet 1,000 Units  1,000 Units Oral Daily Miguel Rota, MD   1,000 Units at 05/29/23 0803    Lab Results:  Results for orders placed or performed during the hospital encounter of 05/27/23 (from the past 48 hours)  Folate     Status: None   Collection Time: 05/27/23  6:35 PM  Result Value Ref Range   Folate 13.9 >5.9 ng/mL    Comment: Performed at [facility], [address] [place], [place] [address]    Blood Alcohol level:  Lab Results  Component Value Date   [facility] <10 05/26/2023   ETH <10 05/26/2023    Metabolic Disorder Labs: Lab Results  Component Value Date   HGBA1C 6.2 (H) 05/31/2019   MPG 131.24 05/31/2019   No results found for: "PROLACTIN" Lab Results  Component Value Date   CHOL 189 07/07/2016  TRIG 158 (H) 07/07/2016   HDL 37 (L) 07/07/2016   CHOLHDL 5.1 07/07/2016   VLDL 32 07/07/2016   LDLCALC 120 (H) 07/07/2016   LDLCALC 193 (H) 10/26/2015     Physical Findings: AIMS:  , ,  ,  ,    CIWA:    COWS:     Musculoskeletal: Strength & Muscle Tone: within normal limits Gait & Station: normal Patient leans: N/A  Psychiatric Specialty Exam:  General Appearance: Casually dressed, unkempt, appears at stated age  Behavior: Cooperative, mildly irritable but later cooperative and interactive  Psychomotor Activity:No psychomotor agitation or retardation noted   Eye Contact: Fair Speech: Fair   Mood: Irritable Affect: Congruent  Thought Process: Linear and goal directed Descriptions of Associations: Intact yes concrete Thought Content: Hallucinations: Denies AH, VH  Delusions: No paranoia  Suicidal Thoughts: Denies passive or active SI, intention, plan  Homicidal Thoughts: Denies HI, intention, plan   Alertness/Orientation: Alert and oriented  Insight: Limited Judgment: Limited  Memory: Limited  Executive Functions  Concentration: Limited Attention Span: Limited Recall: Limited Fund of Knowledge: Limited   Assets  Assets: Housing Family support Access to care   Physical Exam: Physical Exam Vitals and nursing note reviewed.    Review of Systems  All other systems reviewed and are negative.  Blood pressure 115/75, pulse 95, temperature 98.2 F (36.8 C), temperature source Oral, resp. rate 18, height 5\' 11"  (1.803 m), weight 112 kg, SpO2 98%. Body mass index is 34.45 kg/m.   Treatment Plan Summary: Daily contact with patient to assess and evaluate symptoms and progress in treatment and Medication management  ASSESSMENT:  Diagnoses / Active Problems: Principal Problem: Dextromethorphan use disorder, severe, dependence (HCC) Diagnosis: Principal Problem:   Dextromethorphan use disorder, severe, dependence (HCC) Active Problems:   PTSD (post-traumatic stress disorder)   Bipolar disorder, current episode mixed, unspecified (HCC)   Substance induced mood disorder (HCC)   PLAN: Safety and  Monitoring:  -- Involuntary admission to inpatient psychiatric unit for safety, stabilization and treatment  -- Daily contact with patient to assess and evaluate symptoms and progress in treatment  -- Patient's case to be discussed in multi-disciplinary team meeting  -- Observation Level : q15 minute checks  -- Vital signs:  q12 hours  -- Precautions: suicide, elopement, and assault  2. Medications:   Continue Catapres 3 times daily for irritability  Continue Prozac 20 mg daily for depression and a history of PTSD  Continue Zyprexa 15 mg at bedtime for mood stabilization and sleep  Continue Glucophage and Glucotrol for diabetes  Continue vitamin D3 for vitamin D deficiency  Continue pantoprazole for GERD  Continue Atarax as needed for anxiety  Continue trazodone as needed for sleep --  The risks/benefits/side-effects/alternatives to this medication were discussed in detail with the patient and time was given for questions. The patient consents to medication trial.    -- Metabolic profile and EKG monitoring obtained while on an atypical antipsychotic (BMI: Lipid Panel: HbgA1c: QTc:)      3. Pertinent labs: Reviewed      Lab ordered: None  4. Group and Therapy: -- Encouraged patient to participate in unit milieu and in scheduled group therapies     Patient was counseled regarding need to abstain completely from illicit drug use including Coricidin after discharge, he agrees.  Unfortunately patient refuses referral to inpatient substance use rehab treatment or referral to SA IOP at time of discharge.  Discussed with patient that resources for SA IOP will be  placed in his chart if he changes his mind after discharge.  -- Short Term Goals: Ability to identify changes in lifestyle to reduce recurrence of condition will improve, Ability to verbalize feelings will improve, Ability to disclose and discuss suicidal ideas, and Ability to demonstrate self-control will improve  -- Long Term Goals:  Improvement in symptoms so as ready for discharge  5. Discharge Planning:   -- Social work and case management to assist with discharge planning and identification of hospital follow-up needs prior to discharge  -- Estimated LOS: 5-7 days  -- Discharge Concerns: Need to establish a safety plan; Medication compliance and effectiveness  -- Discharge Goals: Return home with outpatient referrals for mental health follow-up including medication management/psychotherapy      Total Time Spent in Direct Patient Care:  I personally spent 35 minutes on the unit in direct patient care. The direct patient care time included face-to-face time with the patient, reviewing the patient's chart, communicating with other professionals, and coordinating care. Greater than 50% of this time was spent in counseling or coordinating care with the patient regarding goals of hospitalization, psycho-education, and discharge planning needs.   Camdyn Laden Linnie Riches, MD 05/29/2023, 2:17 PM

## 2023-05-29 NOTE — Group Note (Signed)
 Date:  05/29/2023 Time:  9:27 PM  Group Topic/Focus:  Wrap-Up Group:   The focus of this group is to help patients review their daily goal of treatment and discuss progress on daily workbooks.    Participation Level:  Did Not Attend   Herbert Walsh 05/29/2023, 9:27 PM

## 2023-05-29 NOTE — Plan of Care (Signed)
  Problem: Coping: Goal: Ability to verbalize frustrations and anger appropriately will improve Outcome: Progressing Goal: Ability to demonstrate self-control will improve Outcome: Progressing   Problem: Health Behavior/Discharge Planning: Goal: Identification of resources available to assist in meeting health care needs will improve Outcome: Progressing Goal: Compliance with treatment plan for underlying cause of condition will improve Outcome: Progressing

## 2023-05-29 NOTE — Progress Notes (Signed)
 Recreation Therapy Notes  INPATIENT RECREATION THERAPY ASSESSMENT  Patient Details Name: Herbert Walsh MRN: [id number] DOB: April 18, 1990 Today's Date: 05/29/2023       Information Obtained From: Chart Review  Able to Participate in Assessment/Interview:  (N/A)  Patient Presentation:  (N/A)  Reason for Admission (Per Patient): Other (Comments) (per chart: agitation, aggression)  Patient Stressors:  (per chart: "being forced to come here")  Coping Skills:    (N/A)  Leisure Interests (2+):  Individual - TV, Social - Administrator, sports, Games - Video games (per chart: watch movies/sports; internet)  Frequency of Recreation/Participation:  (N/A)  Awareness of Community Resources:   (N/A)  Community Resources:     Current Use:    If no, Barriers?:    Expressed Interest in [employer] Information:  (N/A)  County of Residence:  [place]  Patient Main Form of Transportation:  (N/A)  Patient Strengths:  per chart: supportive family/friends; learning new things, get along with people  Patient Identified Areas of Improvement:  N/A  Patient Goal for Hospitalization:  per chart: wants to leave  Current SI (including self-harm):   (per chart: denies)  Current HI:   (per chart: denies)  Current AVH:    Staff Intervention Plan: Group Attendance, Collaborate with Interdisciplinary Treatment Team  Consent to Intern Participation: N/A   Flower Franko-McCall, LRT,CTRS Herbert Walsh 05/29/2023, 1:14 PM

## 2023-05-29 NOTE — Progress Notes (Addendum)
 Collateral contact - Aariv Medlock (mom) [phone]  Mom said that patient would benefit for a day program.  CSW attempted to ask patient if he would like this information, but he was asleep.    CSW emailed mom information about a day program via secure email:    [email or web address]  [facility] (day program) - [phone]   Aydan Levitz, LCSWA 05/29/2023

## 2023-05-30 DIAGNOSIS — F1920 Other psychoactive substance dependence, uncomplicated: Secondary | ICD-10-CM

## 2023-05-30 MED ORDER — FLUOXETINE HCL 20 MG PO CAPS: 20.0000 mg | ORAL_CAPSULE | Freq: Every day | ORAL | 0 refills | Status: AC

## 2023-05-30 MED ORDER — TRAZODONE HCL 50 MG PO TABS
50.0000 mg | ORAL_TABLET | Freq: Every evening | ORAL | 0 refills | Status: AC | PRN
Start: 2023-05-30 — End: ?

## 2023-05-30 MED ORDER — OLANZAPINE 15 MG PO TABS: 15.0000 mg | ORAL_TABLET | Freq: Every day | ORAL | 0 refills | Status: DC

## 2023-05-30 MED ORDER — HYDROXYZINE HCL 25 MG PO TABS: 25.0000 mg | ORAL_TABLET | Freq: Three times a day (TID) | ORAL | 0 refills | Status: AC | PRN

## 2023-05-30 NOTE — Progress Notes (Signed)
 D: Pt A & O X 3. Denies SI, HI, AVH and pain at this time. D/C home as ordered. Picked up in lobby by his mother. A: D/C instructions reviewed with pt including prescriptions and follow up appointment, compliance encouraged. All belongings from locker 36 given to pt at time of departure. Scheduled medications given with verbal education and effects monitored. Safety checks maintained without incident till time of d/c.  R: Pt receptive to care. Compliant with medications when offered. Denies adverse drug reactions when assessed. Verbalized understanding related to d/c instructions. Signed belonging sheet in agreement with items received from locker. Ambulatory with a steady gait. Appears to be in no physical distress at time of departure.

## 2023-05-30 NOTE — Progress Notes (Signed)
  [facility] Adult Case Management Discharge Plan :  Will you be returning to the same living situation after discharge:  Yes,  pt is returning home at discharge At discharge, do you have transportation home?: Yes,  pt will be picked up at 4:00 PM by mother Do you have the ability to pay for your medications: Yes,  pt has active health insurance  Release of information consent forms completed and in the chart;  Patient's signature needed at discharge.  Patient to Follow up at:  Follow-up Information     [facility] Go on 06/11/2023.   Why: You have a hospital follow up appointment for an assessment, to obtain therapy and medication management services on 06/11/23 at 9:00 am.  The appointment will be held in person. Contact information: [address] [place] [address] [phone]                 Next level of care provider has access to [facility] Link:no  Safety Planning and Suicide Prevention discussed: Yes,  Herbert Walsh (mom) [phone]     Has patient been referred to the Quitline?: Patient refused referral for treatment  Patient has been referred for addiction treatment: Patient refused referral for treatment.  Vonzell Guerin, LCSWA 05/30/2023, 9:51 AM

## 2023-05-30 NOTE — Discharge Summary (Signed)
 Physician Discharge Summary Note  Patient:  Herbert Walsh is an 33 y.o., male MRN:  [id number] DOB:  [birth year] Patient phone:  [phone] (home)  Patient address:   [address] [place] [address],  Total Time spent with patient: 45 minutes  Date of Admission:  05/27/2023 Date of Discharge: 05/30/23  Reason for Admission:  33 y/o M with PPH of intermittent explosive disorder, PTSD, psychosis, bipolar disorder, tourettes presenting to ER via police under IVC. After presentation he became agitated and threw himself on the floor, attempted to elope, required prn medication for agitation.   Principal Problem: Dextromethorphan use disorder, severe, dependence (HCC) Discharge Diagnoses: Principal Problem:   Dextromethorphan use disorder, severe, dependence (HCC) Active Problems:   PTSD (post-traumatic stress disorder)   Bipolar disorder, current episode mixed, unspecified (HCC)   Substance induced mood disorder (HCC)   Past Psychiatric History:  See H&P  Past Medical History:  Past Medical History:  Diagnosis Date   Anemia    Arthritis    rheumatoid   Asthma    Depression    GERD (gastroesophageal reflux disease)    History of COVID-19 03/29/2020   Hypertension 2019   Ischemic colitis (HCC) 09/2008   MSSA (methicillin susceptible Staphylococcus aureus)    sternaoclavicular absecess drainage   Osteoarthritis    sterum and right shoulder   PTSD (post-traumatic stress disorder)    Substance use disorder    Tachycardia     Past Surgical History:  Procedure Laterality Date   APPENDECTOMY     APPLICATION OF WOUND VAC Right 06/20/2019   Procedure: APPLICATION OF WOUND VAC;  Surgeon: [name], MD;  Location: [facility] OR;  Service: Vascular;  Laterality: Right;   APPLICATION OF WOUND VAC Right 06/23/2019   Procedure: APPLICATION OF WOUND VAC;  Surgeon: [name], MD;  Location: [facility] OR;  Service: Thoracic;  Laterality: Right;   COLONOSCOPY     x 2   I & D  EXTREMITY Right 05/31/2019   Procedure: IRRIGATION AND DEBRIDEMENT LOWER EXTREMITY;  Surgeon: Tarry Kos, MD;  Location: [facility] OR;  Service: Orthopedics;  Laterality: Right;   I & D EXTREMITY Right 06/20/2019   Procedure: DEBRIDEMENT OF STERNOCLAVICULAR JOINT ABSCESS;  Surgeon: [name], MD;  Location: [facility] OR;  Service: Vascular;  Laterality: Right;   INCISION AND DRAINAGE OF WOUND Right 04/12/2020   Procedure: excision of right sternoclavicular joint infection;  Surgeon: Peggye Form, DO;  Location: [facility];  Service: Plastics;  Laterality: Right;  45 min total   STERNAL WOUND DEBRIDEMENT Right 09/19/2019   Procedure: EXCISIONAL DEBRIDEMENT AND IRRIGATION OF RIGHT STERNOCLAVICULAR JOINT WOUND;  Surgeon: [name], MD;  Location: [facility] OR;  Service: Thoracic;  Laterality: Right;   TEE WITHOUT CARDIOVERSION N/A 06/05/2019   Procedure: TRANSESOPHAGEAL ECHOCARDIOGRAM (TEE);  Surgeon: Lewayne Bunting, MD;  Location: [facility] ENDOSCOPY;  Service: Cardiovascular;  Laterality: N/A;   WOUND EXPLORATION Right 06/23/2019   Procedure: irrigation and clean out right shoulder;  Surgeon: [name], MD;  Location: [facility] OR;  Service: Thoracic;  Laterality: Right;    Family History:  Family History  Problem Relation Age of Onset   Alcohol abuse Mother    Depression Mother    Drug abuse Mother    Physical abuse Mother    Sexual abuse Mother    ADD / ADHD Paternal Aunt    Alcohol abuse Maternal Grandfather    Bipolar disorder Maternal Grandmother  Schizophrenia Maternal Grandmother    Drug abuse Maternal Grandmother    Alcohol abuse Paternal Grandfather    Depression Paternal Grandfather    Depression Paternal Grandmother    Drug abuse Paternal Grandmother    Family Psychiatric  History:  See H&P Social History:  Social History   Substance and Sexual Activity  Alcohol Use Not Currently   Comment: rare beer     Social History   Substance and Sexual Activity   Drug Use Not Currently   Types: Marijuana   Comment: cough med 24 pills daily    Social History   Socioeconomic History   Marital status: Legally Separated    Spouse name: Not on file   Number of children: 0   Years of education: Not on file   Highest education level: Some college, no degree  Occupational History   Not on file  Tobacco Use   Smoking status: Every Day    Current packs/day: 1.00    Average packs/day: 1 pack/day for 15.0 years (15.0 ttl pk-yrs)    Types: Cigarettes   Smokeless tobacco: Never  Vaping Use   Vaping status: Never Used  Substance and Sexual Activity   Alcohol use: Not Currently    Comment: rare beer   Drug use: Not Currently    Types: Marijuana    Comment: cough med 24 pills daily   Sexual activity: Not Currently  Other Topics Concern   Not on file  Social History Narrative   Not on file   Social Drivers of Health   Financial Resource Strain: Low Risk  (01/27/2019)   Received from [facility], [facility]   Overall Financial Resource Strain (CARDIA)    Difficulty of Paying Living Expenses: Not hard at all  Food Insecurity: Food Insecurity Present (05/27/2023)   Hunger Vital Sign    Worried About Running Out of Food in the Last Year: Sometimes true    Ran Out of Food in the Last Year: Sometimes true  Transportation Needs: No Transportation Needs (05/27/2023)   PRAPARE - Administrator, Civil Service (Medical): No    Lack of Transportation (Non-Medical): No  Physical Activity: Inactive (01/27/2019)   Received from [facility], [facility]   Exercise Vital Sign    Days of Exercise per Week: 0 days    Minutes of Exercise per Session: 0 min  Stress: No Stress Concern Present (01/27/2019)   Received from [facility], [facility] - Occupational Stress Questionnaire    Feeling of Stress : Not at all  Social Connections: Socially Isolated (01/27/2019)   Received  from [facility], [facility]   Social Connection and Isolation Panel [NHANES]    Frequency of Communication with Friends and Family: More than three times a week    Frequency of Social Gatherings with Friends and Family: Once a week    Attends Religious Services: Never    Database administrator or Organizations: No    Attends Engineer, structural: Never    Marital Status: Never married   See H&P  Substance Use History:  See H&P  Hospital Course:   During the patient's hospitalization, patient had extensive initial psychiatric evaluation, and follow-up psychiatric evaluations every day.   Psychiatric diagnoses provided upon initial assessment: Principal Problem:   Dextromethorphan use disorder, severe, dependence (HCC) Active Problems:   PTSD (post-traumatic stress disorder)   Bipolar disorder, current  episode mixed, unspecified (HCC)   Substance induced mood disorder (HCC)   Patient's psychiatric medications were adjusted on admission: Prozac 20 mg daily was started for depression, Zyprexa 15 mg at bedtime was added to help with mood stabilization and depressed mood.  Atarax was added as needed for anxiety as well as trazodone as needed for sleep   During the hospitalization, other adjustments were made to the patient's psychiatric medication regimen: Prozac was continued 20 mg daily for depression and mood, Zyprexa was continued 15 mg at bedtime for mood stabilization and depression, Atarax 25 mg daily as needed for anxiety was utilized once to twice daily during hospital stay with good efficacy, trazodone 50 mg at bedtime was utilized nightly with good efficacy and safety reported.   Patient's care was discussed during the interdisciplinary team meeting every day during the hospitalization.   The patient denied having side effects to prescribed psychiatric medication.   Gradually, patient started adjusting to milieu. The patient was evaluated each day by a clinical  provider to ascertain response to treatment. Improvement was noted by the patient's report of decreasing symptoms, improved sleep and appetite, affect, medication tolerance, behavior, and participation in unit programming.  Patient was asked each day to complete a self inventory noting mood, mental status, pain, new symptoms, anxiety and concerns.     Symptoms were reported as significantly decreased or resolved completely by discharge.  During hospital stay patient's mother was contacted it became clear that her concern regarding patient's threatening harm to her was related to an incident about 2 months prior to admission with no recent incidents reported, also her concern of patient misusing Coricidin over-the-counter is also confirmed that his last use was 1 and half months ago with no recent use.  Patient was recommended for inpatient substance use rehab referral but he refused, he was also then recommended for referral for SA IOP but he refused resources for SA IOP to be added to his discharge paperwork if he changes his mind after discharge.  During hospital stay no incident of physical aggression or agitation noted, patient continued to present pleasant and cooperative in general with some irritability mainly related to him wanting discharge and not wanting to stay in the hospital, patient's irritability was noted to be behavioral.  Patient continued to display limited insight to need for help for substance use problem after discharge.  In regard to psychiatric care he reported he has an outpatient psychiatric provider and he has a follow-up with him in the next few weeks but he refused for staff to contact that provider to confirm, he did refuse referral to any other psychiatric provider in the area but it was noted to him that resources for outpatient psychiatric care in the area will be added to his discharge paperwork as well, he agreed.  Patient was counseled extensively during hospital stay  regarding need to abstain completely from illicit drug use including alcohol use despite the fact that he reported infrequent use of alcohol.  Patient did continue to deny SI HI or AVH during hospital stay did not present psychotic or with any sign of physical aggression or agitation, no self injures behavior was noted during hospital stay, given above-noted information it was felt patient does not meet criteria for IVC any longer. On day of discharge, patient was evaluated on 4/16 the patient reports that their mood is stable. The patient denied having suicidal thoughts for more than 48 hours prior to discharge.  Patient denies having homicidal  thoughts.  Patient denies having auditory hallucinations.  Patient denies any visual hallucinations or other symptoms of psychosis. The patient was motivated to continue taking medication with a goal of continued improvement in mental health.    The patient reports their target psychiatric symptoms of mood swings and anxiety responded well to the psychiatric medications, and the patient reports overall benefit other psychiatric hospitalization. Supportive psychotherapy was provided to the patient. The patient also participated in regular group therapy while hospitalized. Coping skills, problem solving as well as relaxation therapies were also part of the unit programming.   Labs were reviewed with the patient, and abnormal results were discussed with the patient.   The patient is able to verbalize their individual safety plan to this provider.   Behavioral Events: None   Restraints: None   Groups: Attended   Medications Changes: As above     Sleep  Fair during hospital stay  Physical Findings: AIMS: Facial and Oral Movements Muscles of Facial Expression: None Lips and Perioral Area: None Jaw: None Tongue: None,Extremity Movements Upper (arms, wrists, hands, fingers): None Lower (legs, knees, ankles, toes): None, Trunk Movements Neck, shoulders,  hips: None, Global Judgements Severity of abnormal movements overall : None Incapacitation due to abnormal movements: None Patient's awareness of abnormal movements: No Awareness, Dental Status Current problems with teeth and/or dentures?: No Does patient usually wear dentures?: No Edentia?: No  CIWA:    COWS:     Musculoskeletal: Strength & Muscle Tone: within normal limits Gait & Station: normal Patient leans: N/A   Psychiatric Specialty Exam:  General Appearance: appears at stated age, fairly dressed and groomed  Behavior: pleasant and cooperative  Psychomotor Activity:No psychomotor agitation or retardation noted   Eye Contact: good Speech: normal amount, tone, volume and latency   Mood: euthymic Affect: congruent, pleasant and interactive  Thought Process: linear, goal directed, no circumstantial or tangential thought process noted, no racing thoughts or flight of ideas Descriptions of Associations: intact Thought Content: Hallucinations: denies AH, VH , does not appear responding to stimuli Delusions: No paranoia or other delusions noted Suicidal Thoughts: denies SI, intention, plan  Homicidal Thoughts: denies HI, intention, plan   Alertness/Orientation: alert and fully oriented  Insight: fair, improved Judgment: fair, improved  Memory: intact  Executive Functions  Concentration: intact  Attention Span: Fair Recall: intact Fund of Knowledge: fair   Assets  Assets: Housing Family support, access to care  Physical Exam:  Physical Exam Vitals and nursing note reviewed.  Constitutional:      Appearance: Normal appearance. He is normal weight.  HENT:     Head: Normocephalic and atraumatic.     Nose: Nose normal.  Eyes:     Extraocular Movements: Extraocular movements intact.  Pulmonary:     Effort: Pulmonary effort is normal.  Musculoskeletal:        General: Normal range of motion.     Cervical back: Normal range of motion.  Neurological:      General: No focal deficit present.     Mental Status: He is alert and oriented to person, place, and time. Mental status is at baseline.  Psychiatric:        Mood and Affect: Mood normal.        Behavior: Behavior normal.        Thought Content: Thought content normal.    Review of Systems  All other systems reviewed and are negative.  Blood pressure 116/84, pulse 94, temperature 98.2 F (36.8 C), temperature source Oral,  resp. rate 18, height 5\' 11"  (1.803 m), weight 112 kg, SpO2 99%. Body mass index is 34.45 kg/m.   Social History   Tobacco Use  Smoking Status Every Day   Current packs/day: 1.00   Average packs/day: 1 pack/day for 15.0 years (15.0 ttl pk-yrs)   Types: Cigarettes  Smokeless Tobacco Never   Tobacco Cessation:  A prescription for an FDA-approved tobacco cessation medication was offered at discharge and the patient refused   Blood Alcohol level:  Lab Results  Component Value Date   [facility] <10 05/26/2023   ETH <10 05/26/2023    Metabolic Disorder Labs:  Lab Results  Component Value Date   HGBA1C 6.2 (H) 05/31/2019   MPG 131.24 05/31/2019   No results found for: "PROLACTIN" Lab Results  Component Value Date   CHOL 189 07/07/2016   TRIG 158 (H) 07/07/2016   HDL 37 (L) 07/07/2016   CHOLHDL 5.1 07/07/2016   VLDL 32 07/07/2016   LDLCALC 120 (H) 07/07/2016   LDLCALC 193 (H) 10/26/2015    See Psychiatric Specialty Exam and Suicide Risk Assessment completed by Attending Physician prior to discharge.  Discharge destination:  Home with mother  Is patient on multiple antipsychotic therapies at discharge:  No   Has Patient had three or more failed trials of antipsychotic monotherapy by history:  No  Recommended Plan for Multiple Antipsychotic Therapies: NA  Discharge Instructions     Diet - low sodium heart healthy   Complete by: As directed    Increase activity slowly   Complete by: As directed       Allergies as of 05/30/2023   No Known  Allergies      Medication List     STOP taking these medications    allopurinol 300 MG tablet Commonly known as: ZYLOPRIM   atorvastatin 10 MG tablet Commonly known as: LIPITOR   CORICIDIN D COLD/FLU/SINUS PO   folic acid 1 MG tablet Commonly known as: FOLVITE   Humira (2 Syringe) 40 MG/0.4ML prefilled syringe Generic drug: adalimumab   mupirocin cream 2 % Commonly known as: BACTROBAN   sertraline 100 MG tablet Commonly known as: ZOLOFT   Vitamin D (Ergocalciferol) 1.25 MG (50000 UNIT) Caps capsule Commonly known as: DRISDOL       TAKE these medications      Indication  cloNIDine 0.2 MG tablet Commonly known as: CATAPRES Take 1 tablet (0.2 mg total) by mouth 3 (three) times daily.  Indication: irritability   FLUoxetine 20 MG capsule Commonly known as: PROZAC Take 1 capsule (20 mg total) by mouth daily. Start taking on: May 31, 2023  Indication: Major Depressive Disorder   glipiZIDE-metformin 5-500 MG tablet Commonly known as: METAGLIP Take 1 tablet by mouth daily.  Indication: Type 2 Diabetes   hydrOXYzine 25 MG tablet Commonly known as: ATARAX Take 1 tablet (25 mg total) by mouth 3 (three) times daily as needed for anxiety.  Indication: Feeling Anxious   lisinopril 10 MG tablet Commonly known as: ZESTRIL Take 10 mg by mouth daily.  Indication: High Blood Pressure   methotrexate 2.5 MG tablet Commonly known as: RHEUMATREX Take 6 tablets by mouth once a week.  Indication: Rheumatoid Arthritis   OLANZapine 15 MG tablet Commonly known as: ZYPREXA Take 1 tablet (15 mg total) by mouth at bedtime.  Indication: Major Depressive Disorder   omeprazole 40 MG capsule Commonly known as: PRILOSEC Take 1 capsule by mouth every morning.  Indication: Gastroesophageal Reflux Disease   traZODone 50 MG tablet  Commonly known as: DESYREL Take 1 tablet (50 mg total) by mouth at bedtime as needed for sleep.  Indication: Trouble Sleeping   Vitamin D3 50  MCG (2000 UT) Tabs Take 1 tablet (50 mcg total) by mouth daily.  Indication: Vitamin D Deficiency        Follow-up Information     [facility] Go on 06/11/2023.   Why: You have a hospital follow up appointment for an assessment, to obtain therapy and medication management services on 06/11/23 at 9:00 am.  The appointment will be held in person. Contact information: [address] [place] [address] [phone]                 Discharge recommendations:   Activity: as tolerated  Diet: heart healthy  # It is recommended to the patient to continue psychiatric medications as prescribed, after discharge from the hospital.     # It is recommended to the patient to follow up with your outpatient psychiatric provider and PCP.   # It was discussed with the patient, the impact of alcohol, drugs, tobacco have been there overall psychiatric and medical wellbeing, and total abstinence from substance use was recommended the patient.ed.   # Prescriptions provided or sent directly to preferred pharmacy at discharge. Patient agreeable to plan. Given opportunity to ask questions. Appears to feel comfortable with discharge.    # In the event of worsening symptoms, the patient is instructed to call the crisis hotline, 911 and or go to the nearest ED for appropriate evaluation and treatment of symptoms. To follow-up with primary care provider for other medical issues, concerns and or health care needs   # Patient was discharged home with a plan to follow up as noted above.  -Follow-up with outpatient primary care doctor and other specialists -for management of chronic medical disease, including: Patient was recommended to continue following up with primary care provider on outpatient basis for long-term management of rheumatoid arthritis, GERD, vitamin D deficiency, hypertension and diabetes.   Patient agrees with D/C instructions and plan.   The patient  received suicide prevention pamphlet:  Yes Belongings returned:  Clothing and Valuables  Total Time Spent in Direct Patient Care:  I personally spent 45 minutes on the unit in direct patient care. The direct patient care time included face-to-face time with the patient, reviewing the patient's chart, communicating with other professionals, and coordinating care. Greater than 50% of this time was spent in counseling or coordinating care with the patient regarding goals of hospitalization, psycho-education, and discharge planning needs.    SignedAlver Jobs, MD 05/30/2023, 9:41 AM

## 2023-05-30 NOTE — [facility] Suicide Risk Assessment (Signed)
 [facility] Discharge Suicide Risk Assessment   Principal Problem: Dextromethorphan use disorder, severe, dependence (HCC) Discharge Diagnoses: Principal Problem:   Dextromethorphan use disorder, severe, dependence (HCC) Active Problems:   PTSD (post-traumatic stress disorder)   Bipolar disorder, current episode mixed, unspecified (HCC)   Substance induced mood disorder (HCC)   Total Time spent with patient: 45 minutes  Reason for admission: 32 y/o M with PPH of intermittent explosive disorder, PTSD, psychosis, bipolar disorder, tourettes presenting to ER via police under IVC. After presentation he became agitated and threw himself on the floor, attempted to elope, required prn medication for agitation.   PTA Medications:  Medications Prior to Admission  Medication Sig Dispense Refill Last Dose/Taking   allopurinol (ZYLOPRIM) 300 MG tablet Take 300 mg by mouth daily.     Past Week   atorvastatin (LIPITOR) 10 MG tablet Take 10 mg by mouth at bedtime.    12 Past Week   Chlorphen-PE-Acetaminophen (CORICIDIN D COLD/FLU/SINUS PO) Take 1 tablet by mouth as needed (per mom abuses this medication- Coricidin with dextromethorphan in it).     Taking As Needed   Cholecalciferol (VITAMIN D3) 50 MCG (2000 UT) TABS Take 1 tablet (50 mcg total) by mouth daily. 30 tablet 2 Past Week   cloNIDine (CATAPRES) 0.2 MG tablet Take 1 tablet (0.2 mg total) by mouth 3 (three) times daily. 270 tablet 0 Past Week   folic acid (FOLVITE) 1 MG tablet Take 1 mg by mouth daily.     Past Week   glipiZIDE-metformin (METAGLIP) 5-500 MG tablet Take 1 tablet by mouth daily.     Past Week   lisinopril (PRINIVIL,ZESTRIL) 10 MG tablet Take 10 mg by mouth daily.     Past Week   methotrexate (RHEUMATREX) 2.5 MG tablet Take 6 tablets by mouth once a week.     Past Month   omeprazole (PRILOSEC) 40 MG capsule Take 1 capsule by mouth every morning.     Past Week   sertraline (ZOLOFT) 100 MG tablet Take 100 mg by mouth daily.     Past Week    Vitamin D, Ergocalciferol, (DRISDOL) 1.25 MG (50000 UNIT) CAPS capsule Take 50,000 Units by mouth once a week.     Taking   HUMIRA 40 MG/0.4ML PSKT Inject 40 mg into the skin once a week. (Patient not taking: Reported on 05/27/2023)     Not Taking   mupirocin cream (BACTROBAN) 2 % Apply 1 Application topically 3 (three) times daily. Apply to the affected area of your left face (Patient taking differently: Apply 1 Application topically 3 (three) times daily as needed. For sores/rash) 15 g 0      Hospital Course:   During the patient's hospitalization, patient had extensive initial psychiatric evaluation, and follow-up psychiatric evaluations every day.  Psychiatric diagnoses provided upon initial assessment: Principal Problem:   Dextromethorphan use disorder, severe, dependence (HCC) Active Problems:   PTSD (post-traumatic stress disorder)   Bipolar disorder, current episode mixed, unspecified (HCC)   Substance induced mood disorder (HCC)  Patient's psychiatric medications were adjusted on admission: Prozac 20 mg daily was started for depression, Zyprexa 15 mg at bedtime was added to help with mood stabilization and depressed mood.  Atarax was added as needed for anxiety as well as trazodone as needed for sleep  During the hospitalization, other adjustments were made to the patient's psychiatric medication regimen: Prozac was continued 20 mg daily for depression and mood, Zyprexa was continued 15 mg at bedtime for mood stabilization and depression,  Atarax 25 mg daily as needed for anxiety was utilized once to twice daily during hospital stay with good efficacy, trazodone 50 mg at bedtime was utilized nightly with good efficacy and safety reported.  Patient's care was discussed during the interdisciplinary team meeting every day during the hospitalization.  The patient denied having side effects to prescribed psychiatric medication.  Gradually, patient started adjusting to milieu. The patient  was evaluated each day by a clinical provider to ascertain response to treatment. Improvement was noted by the patient's report of decreasing symptoms, improved sleep and appetite, affect, medication tolerance, behavior, and participation in unit programming.  Patient was asked each day to complete a self inventory noting mood, mental status, pain, new symptoms, anxiety and concerns.    Symptoms were reported as significantly decreased or resolved completely by discharge.  During hospital stay patient's mother was contacted it became clear that her concern regarding patient's threatening harm to her was related to an incident about 2 months prior to admission with no recent incidents reported, also her concern of patient misusing Coricidin over-the-counter is also confirmed that his last use was 1 and half months ago with no recent use.  Patient was recommended for inpatient substance use rehab referral but he refused, he was also then recommended for referral for SA IOP but he refused resources for SA IOP to be added to his discharge paperwork if he changes his mind after discharge.  During hospital stay no incident of physical aggression or agitation noted, patient continued to present pleasant and cooperative in general with some irritability mainly related to him wanting discharge and not wanting to stay in the hospital, patient's irritability was noted to be behavioral.  Patient continued to display limited insight to need for help for substance use problem after discharge.  In regard to psychiatric care he reported he has an outpatient psychiatric provider and he has a follow-up with him in the next few weeks but he refused for staff to contact that provider to confirm, he did refuse referral to any other psychiatric provider in the area but it was noted to him that resources for outpatient psychiatric care in the area will be added to his discharge paperwork as well, he agreed.  Patient was counseled  extensively during hospital stay regarding need to abstain completely from illicit drug use including alcohol use despite the fact that he reported infrequent use of alcohol.  Patient did continue to deny SI HI or AVH during hospital stay did not present psychotic or with any sign of physical aggression or agitation, no self injures behavior was noted during hospital stay, given above-noted information it was felt patient does not meet criteria for IVC any longer. On day of discharge, patient was evaluated on 4/16 the patient reports that their mood is stable. The patient denied having suicidal thoughts for more than 48 hours prior to discharge.  Patient denies having homicidal thoughts.  Patient denies having auditory hallucinations.  Patient denies any visual hallucinations or other symptoms of psychosis. The patient was motivated to continue taking medication with a goal of continued improvement in mental health.   The patient reports their target psychiatric symptoms of mood swings and anxiety responded well to the psychiatric medications, and the patient reports overall benefit other psychiatric hospitalization. Supportive psychotherapy was provided to the patient. The patient also participated in regular group therapy while hospitalized. Coping skills, problem solving as well as relaxation therapies were also part of the unit programming.  Labs were  reviewed with the patient, and abnormal results were discussed with the patient.  The patient is able to verbalize their individual safety plan to this provider.  Behavioral Events: None  Restraints: None  Groups: Attended  Medications Changes: As above   Sleep  Fair during hospital stay  Musculoskeletal: Strength & Muscle Tone: within normal limits Gait & Station: normal Patient leans: N/A  Psychiatric Specialty Exam  General Appearance: appears at stated age, fairly dressed and groomed  Behavior: pleasant and  cooperative  Psychomotor Activity:No psychomotor agitation or retardation noted   Eye Contact: good Speech: normal amount, tone, volume and latency   Mood: euthymic Affect: congruent, pleasant and interactive  Thought Process: linear, goal directed, no circumstantial or tangential thought process noted, no racing thoughts or flight of ideas Descriptions of Associations: intact Thought Content: Hallucinations: denies AH, VH , does not appear responding to stimuli Delusions: No paranoia or other delusions noted Suicidal Thoughts: denies SI, intention, plan  Homicidal Thoughts: denies HI, intention, plan   Alertness/Orientation: alert and fully oriented  Insight: fair, improved Judgment: fair, improved  Memory: intact  Executive Functions  Concentration: intact  Attention Span: Fair Recall: intact Fund of Knowledge: fair   Art therapist  Concentration: intact Attention Span: Fair Recall: intact Fund of Knowledge: fair   Assets  Assets: Housing   Physical Exam: Physical Exam ROS Blood pressure 116/84, pulse 94, temperature 98.2 F (36.8 C), temperature source Oral, resp. rate 18, height 5\' 11"  (1.803 m), weight 112 kg, SpO2 99%. Body mass index is 34.45 kg/m.  Mental Status Per Nursing Assessment::   On Admission:  NA  Demographic Factors:  Male, Caucasian, and Unemployed  Loss Factors: NA  Historical Factors: Impulsivity  Risk Reduction Factors:   Living with another person, especially a relative and Positive social support  Continued Clinical Symptoms:  Bipolar Disorder:   Depressive phase  Cognitive Features That Contribute To Risk:  Polarized thinking    Suicide Risk:  Minimal: No identifiable suicidal ideation.  Patients presenting with no risk factors but with morbid ruminations; may be classified as minimal risk based on the severity of the depressive symptoms   Follow-up Information     [facility] Go on  06/11/2023.   Why: You have a hospital follow up appointment for an assessment, to obtain therapy and medication management services on 06/11/23 at 9:00 am.  The appointment will be held in person. Contact information: [address] [place] [address] [phone]                 Plan Of Care/Follow-up recommendations:   Discharge recommendations:    Activity: as tolerated  Diet: heart healthy  # It is recommended to the patient to continue psychiatric medications as prescribed, after discharge from the hospital.     # It is recommended to the patient to follow up with your outpatient psychiatric provider and PCP.   # It was discussed with the patient, the impact of alcohol, drugs, tobacco have been there overall psychiatric and medical wellbeing, and total abstinence from substance use was recommended the patient.ed.   # Prescriptions provided or sent directly to preferred pharmacy at discharge. Patient agreeable to plan. Given opportunity to ask questions. Appears to feel comfortable with discharge.    # In the event of worsening symptoms, the patient is instructed to call the crisis hotline, 911 and or go to the nearest ED for appropriate evaluation and treatment of symptoms. To follow-up with primary  care provider for other medical issues, concerns and or health care needs   # Patient was discharged home with a plan to follow up as noted above.  -Follow-up with outpatient primary care doctor and other specialists -for management of chronic medical disease, including: Patient was recommended to continue following up with primary care provider on outpatient basis for long-term management of rheumatoid arthritis, GERD, vitamin D deficiency, hypertension and diabetes   Patient agrees with D/C instructions and plan.  The patient received suicide prevention pamphlet:  Yes Belongings returned:  Clothing and Valuables  Total Time Spent in Direct Patient Care:  I  personally spent 45 minutes on the unit in direct patient care. The direct patient care time included face-to-face time with the patient, reviewing the patient's chart, communicating with other professionals, and coordinating care. Greater than 50% of this time was spent in counseling or coordinating care with the patient regarding goals of hospitalization, psycho-education, and discharge planning needs.   Cathey Fredenburg Linnie Riches, MD 05/30/2023, 9:30 AM

## 2023-05-30 NOTE — Plan of Care (Signed)
   Problem: Education: Goal: Knowledge of [facility] Education information/materials will improve Outcome: Progressing Goal: Emotional status will improve Outcome: Progressing Goal: Mental status will improve Outcome: Progressing Goal: Verbalization of understanding the information provided will improve Outcome: Progressing

## 2023-07-05 ENCOUNTER — Ambulatory Visit (INDEPENDENT_AMBULATORY_CARE_PROVIDER_SITE_OTHER): Payer: MEDICAID | Admitting: Otolaryngology

## 2023-07-05 ENCOUNTER — Encounter (INDEPENDENT_AMBULATORY_CARE_PROVIDER_SITE_OTHER): Payer: Self-pay | Admitting: Otolaryngology

## 2023-07-05 VITALS — BP 130/89 | HR 114

## 2023-07-05 DIAGNOSIS — R0981 Nasal congestion: Secondary | ICD-10-CM | POA: Diagnosis not present

## 2023-07-05 DIAGNOSIS — G4489 Other headache syndrome: Secondary | ICD-10-CM

## 2023-07-05 DIAGNOSIS — J3489 Other specified disorders of nose and nasal sinuses: Secondary | ICD-10-CM

## 2023-07-05 DIAGNOSIS — J343 Hypertrophy of nasal turbinates: Secondary | ICD-10-CM

## 2023-07-05 DIAGNOSIS — J342 Deviated nasal septum: Secondary | ICD-10-CM | POA: Diagnosis not present

## 2023-07-05 MED ORDER — FLUTICASONE PROPIONATE 50 MCG/ACT NA SUSP
2.0000 | Freq: Every day | NASAL | 6 refills | Status: AC
Start: 2023-07-05 — End: ?

## 2023-07-05 MED ORDER — AZELASTINE HCL 0.1 % NA SOLN
2.0000 | Freq: Every day | NASAL | 12 refills | Status: AC
Start: 2023-07-05 — End: ?

## 2023-07-05 NOTE — Progress Notes (Signed)
 Dear Dr. Cathleen Coach, Here is my assessment for our mutual patient, Kassidy Frankson. Thank you for allowing me the opportunity to care for your patient. Please do not hesitate to contact me should you have any other questions. Sincerely, Dr. Milon Aloe  Otolaryngology Clinic Note  HISTORY: Arshad Oberholzer is a 33 y.o. male kindly referred by Dr. Cathleen Coach for evaluation of multiple complaints including sinonasal complaints.  Initial visit (06/2023): Several complaints today: he reports that he is unable to feel his face, and he feels like has sandpaper "in my mouth" and feels like "sand is running through my veins". Some diffuse facial pressure and occipital headache. Body is tingling. Some bilateral visual blurriness but no diplopia. "Frontal lobe feels sandy, and is buzzing and he feels like I am going to pass out." He reports that this has been ongoing for several years and getting worse. He is not having any relief and has been evaluated but without an identifiable cause Of note, he also reports bilateral nasal congestion but denies h/o frequent sinus infections or left ethmoid area pressure. He does not use any nasal medications. He has tried flonase  and that has not helped and antibiotics. He reports that nothing has helped. Allergy testing has not been done. No previous sinonasal surgery. He has had multiple imaging studies for this as well.   GLP-1: no AP/AC: no  Tobacco: smoke 1 PPD  PMHx: Fatigue, MDD, Prior substance use, RA, HTN, DM, Recent IVC admission  RADIOGRAPHIC EVALUATION AND INDEPENDENT REVIEW OF OTHER RECORDS:: Liisa Reeves (04/2023) Referral notes reviewed and uploaded or available in chart in media tab: noted sinus disease - left max; advised OTC decongestant and flonase ; Ref to ENT CMP, CRP, ESR, and CBC 06/25/2023: Glc 397, BUN/Cr wnl, elevated LFT; CRP 11, ESR 15, WBC 9.6, Eos 200 CTH 05/26/2023 independently interpreted with respsect to sinuses: left max opacification and  hypoplastic c/f silent sinus syndrome; otherwise no significant sinonasal opacification. -- present since at least 2022.  Past Medical History:  Diagnosis Date   Anemia    Arthritis    rheumatoid   Asthma    Depression    GERD (gastroesophageal reflux disease)    History of COVID-19 03/29/2020   Hypertension 2019   Ischemic colitis (HCC) 09/2008   MSSA (methicillin susceptible Staphylococcus aureus)    sternaoclavicular absecess drainage   Osteoarthritis    sterum and right shoulder   PTSD (post-traumatic stress disorder)    Substance use disorder    Tachycardia    Past Surgical History:  Procedure Laterality Date   APPENDECTOMY     APPLICATION OF WOUND VAC Right 06/20/2019   Procedure: APPLICATION OF WOUND VAC;  Surgeon: Heriberto London, MD;  Location: [facility] OR;  Service: Vascular;  Laterality: Right;   APPLICATION OF WOUND VAC Right 06/23/2019   Procedure: APPLICATION OF WOUND VAC;  Surgeon: Heriberto London, MD;  Location: [facility] OR;  Service: Thoracic;  Laterality: Right;   COLONOSCOPY     x 2   I & D EXTREMITY Right 05/31/2019   Procedure: IRRIGATION AND DEBRIDEMENT LOWER EXTREMITY;  Surgeon: Wes Hamman, MD;  Location: [facility] OR;  Service: Orthopedics;  Laterality: Right;   I & D EXTREMITY Right 06/20/2019   Procedure: DEBRIDEMENT OF STERNOCLAVICULAR JOINT ABSCESS;  Surgeon: Heriberto London, MD;  Location: [facility] OR;  Service: Vascular;  Laterality: Right;   INCISION AND DRAINAGE OF WOUND Right 04/12/2020   Procedure: excision of right sternoclavicular joint infection;  Surgeon: Thornell Flirt,  DO;  Location: [facility];  Service: Plastics;  Laterality: Right;  45 min total   STERNAL WOUND DEBRIDEMENT Right 09/19/2019   Procedure: EXCISIONAL DEBRIDEMENT AND IRRIGATION OF RIGHT STERNOCLAVICULAR JOINT WOUND;  Surgeon: Heriberto London, MD;  Location: [facility] OR;  Service: Thoracic;  Laterality: Right;   TEE WITHOUT CARDIOVERSION N/A 06/05/2019   Procedure: TRANSESOPHAGEAL  ECHOCARDIOGRAM (TEE);  Surgeon: Lenise Quince, MD;  Location: [facility] ENDOSCOPY;  Service: Cardiovascular;  Laterality: N/A;   WOUND EXPLORATION Right 06/23/2019   Procedure: irrigation and clean out right shoulder;  Surgeon: Heriberto London, MD;  Location: [facility] OR;  Service: Thoracic;  Laterality: Right;   Family History  Problem Relation Age of Onset   Alcohol abuse Mother    Depression Mother    Drug abuse Mother    Physical abuse Mother    Sexual abuse Mother    ADD / ADHD Paternal Aunt    Alcohol abuse Maternal Grandfather    Bipolar disorder Maternal Grandmother    Schizophrenia Maternal Grandmother    Drug abuse Maternal Grandmother    Alcohol abuse Paternal Grandfather    Depression Paternal Grandfather    Depression Paternal Grandmother    Drug abuse Paternal Grandmother    Social History   Tobacco Use   Smoking status: Every Day    Current packs/day: 1.00    Average packs/day: 1 pack/day for 15.0 years (15.0 ttl pk-yrs)    Types: Cigarettes   Smokeless tobacco: Never  Substance Use Topics   Alcohol use: Not Currently    Comment: rare beer   No Known Allergies Current Outpatient Medications  Medication Sig Dispense Refill   azelastine (ASTELIN) 0.1 % nasal spray Place 2 sprays into both nostrils daily. Use in each nostril as directed 30 mL 12   Cholecalciferol  (VITAMIN D3) 50 MCG (2000 UT) TABS Take 1 tablet (50 mcg total) by mouth daily. 30 tablet 2   cloNIDine  (CATAPRES ) 0.2 MG tablet Take 1 tablet (0.2 mg total) by mouth 3 (three) times daily. 270 tablet 0   FLUoxetine  (PROZAC ) 20 MG capsule Take 1 capsule (20 mg total) by mouth daily. 30 capsule 0   fluticasone  (FLONASE ) 50 MCG/ACT nasal spray Place 2 sprays into both nostrils daily. 16 g 6   glipiZIDE -metformin  (METAGLIP) 5-500 MG tablet Take 1 tablet by mouth daily.     hydrOXYzine  (ATARAX ) 25 MG tablet Take 1 tablet (25 mg total) by mouth 3 (three) times daily as needed for anxiety. 30 tablet 0   lisinopril   (PRINIVIL ,ZESTRIL ) 10 MG tablet Take 10 mg by mouth daily.     methotrexate (RHEUMATREX) 2.5 MG tablet Take 6 tablets by mouth once a week.     OLANZapine  (ZYPREXA ) 15 MG tablet Take 1 tablet (15 mg total) by mouth at bedtime. 30 tablet 0   omeprazole (PRILOSEC) 40 MG capsule Take 1 capsule by mouth every morning.     traZODone  (DESYREL ) 50 MG tablet Take 1 tablet (50 mg total) by mouth at bedtime as needed for sleep. 30 tablet 0   No current facility-administered medications for this visit.   BP 130/89   Pulse (!) 114   SpO2 95%   PHYSICAL EXAM:  BP 130/89   Pulse (!) 114   SpO2 95%    Salient findings:  CN II-XII intact - patient reports bilateral diminished sensation V2; mild enophthalmos; EOM intact Bilateral EAC clear and TM intact with well pneumatized middle ear spaces Nose: Anterior rhinoscopy reveals septum dev  left, right > left inferior turbinate hypertrophy .  Nasal endoscopy was indicated to better evaluate the nose and paranasal sinuses, given the patient's history and exam findings, and is detailed below. No lesions of oral cavity/oropharynx; edentulous No obviously palpable neck masses/lymphadenopathy/thyromegaly No respiratory distress or stridor No skin lesions noted  Intermittent lack of congruent thoughts; affect anxious  PROCEDURE:  Prior to initiating any procedures, risks/benefits/alternatives were explained to the patient and verbal consent obtained. Diagnostic Nasal Endoscopy Pre-procedure diagnosis: Concern for sinusitis Post-procedure diagnosis: same Indication: See pre-procedure diagnosis and physical exam above Complications: None apparent EBL: 0 mL Anesthesia: Lidocaine  4% and topical decongestant was topically sprayed in each nasal cavity  Description of Procedure:  Patient was identified. A rigid 30 degree endoscope was utilized to evaluate the sinonasal cavities, mucosa, sinus ostia and turbinates and septum.  Overall, signs of mucosal  inflammation are not noted.  Also noted are left septal deviation, left uncinate atelectatic.  No mucopurulence, polyps, or masses noted.   Right Middle meatus: clear Right SE Recess: clear Left MM: clear -- left uncinate atelectatic Left SE Recess: clear Photodocumentation was obtained.  CPT CODE -- 69629 - Mod 25   ASSESSMENT:  33 y.o. with history as above now with:  1. Other headache syndrome   2. Nasal septal deviation   3. Hypertrophy of both inferior nasal turbinates   4. Nasal congestion   5. Silent sinus syndrome    Multiple complaints today but does not appear to fit a primary H&N pattern. Would not expect silent sinus to cause his headaches and other findings, especially as it has been ongoing since at least 2022. We discussed that his nasal endo overall looks reassuring. He otherwise does not have sig nasal symptoms except for congestion.  We've discussed issues and options today..  The risks, benefits and alternatives were discussed and questions answered.  He has elected to proceed with medical management. Although silent sinus syndrome is mostly managed surgically, pt did not wish for it and I do not think it would help his other symptoms.  - Start flonase  and astelin BID for his congestion; consider daily nasal rinses - we discussed f/u and return precautions - he would like to follow up PRN  See below regarding exact medications prescribed this encounter including dosages and route: Meds ordered this encounter  Medications   fluticasone  (FLONASE ) 50 MCG/ACT nasal spray    Sig: Place 2 sprays into both nostrils daily.    Dispense:  16 g    Refill:  6   azelastine (ASTELIN) 0.1 % nasal spray    Sig: Place 2 sprays into both nostrils daily. Use in each nostril as directed    Dispense:  30 mL    Refill:  12     Thank you for allowing me the opportunity to care for your patient. Please do not hesitate to contact me should you have any other  questions.  Sincerely, Milon Aloe, MD Otolaryngologist (ENT), [facility] Phone: [phone] Fax: [phone]  MDM:  Level 4: [phone] Complexity/Problems addressed: mod Data complexity: mod - independent interpretation of CT imaging - Morbidity: mod  - Drug prescribed or managed: y  07/05/2023, 12:50 PM

## 2023-09-27 ENCOUNTER — Encounter: Payer: Self-pay | Admitting: Podiatry

## 2023-09-27 ENCOUNTER — Ambulatory Visit: Payer: MEDICAID | Admitting: Podiatry

## 2023-09-27 ENCOUNTER — Ambulatory Visit (INDEPENDENT_AMBULATORY_CARE_PROVIDER_SITE_OTHER): Payer: MEDICAID | Admitting: Podiatry

## 2023-09-27 ENCOUNTER — Ambulatory Visit (INDEPENDENT_AMBULATORY_CARE_PROVIDER_SITE_OTHER): Payer: MEDICAID

## 2023-09-27 VITALS — Ht 71.0 in | Wt 247.0 lb

## 2023-09-27 DIAGNOSIS — M7989 Other specified soft tissue disorders: Secondary | ICD-10-CM | POA: Diagnosis not present

## 2023-09-27 DIAGNOSIS — M7752 Other enthesopathy of left foot: Secondary | ICD-10-CM | POA: Diagnosis not present

## 2023-09-27 DIAGNOSIS — M7751 Other enthesopathy of right foot: Secondary | ICD-10-CM | POA: Diagnosis not present

## 2023-09-27 DIAGNOSIS — B351 Tinea unguium: Secondary | ICD-10-CM | POA: Diagnosis not present

## 2023-09-27 MED ORDER — CICLOPIROX 8 % EX SOLN: Freq: Every day | CUTANEOUS | 2 refills | Status: AC

## 2023-09-27 NOTE — Patient Instructions (Signed)
 For inserts I like POWERSTEPS, SUPERFEET, [employer]  --  Shin Splints  Shin splints is a painful condition that is felt in the front or the side of your legs. The muscles, the cord-like structures that connect muscles to bones (tendons), and the thin layer of tissue that covers the shin bone get irritated and swollen (inflamed). Shin splints can be caused by intense activities or exercises. What are the causes? This condition may be caused by: Using your muscles too much. Activities where you repeat the same movements. Flat feet or stiff arches. Other things that can make shin splints more likely include: Suddenly exercising more. Starting a new, intense activity. Running long distances or up hills. Playing sports with sudden starts and stops. Not warming up before an activity. Wearing old or worn-out shoes. What are the signs or symptoms? The main symptom of this condition is pain that happens: On the front of the lower leg. In the muscles on either side of the shin bone. The pain can happen when you exercise or when you rest. How is this treated? Treatment for this condition depends on: Your age. Your history. Your health. How bad the pain is. Most cases can be managed by doing one or more of the following: Resting. Reducing the length and intensity of your exercise. Stopping or reducing the activity that causes shin pain. Taking medicines. Icing, massaging, stretching, and strengthening your shins. Wearing shoes that: Have stiff heels. Absorb shock. Support your arches. For very bad shin pain, your doctor may have you use crutches. This avoids putting weight on your legs. Use them as you are told. Follow these instructions at home: Medicines Take over-the-counter and prescription medicines only as told by your doctor. If told, take steps to prevent problems with pooping (constipation). You may need to: Drink enough fluid to keep your pee (urine) pale yellow. Take  medicines. You will be told what medicines to take. Eat foods that are high in fiber. These include beans, whole grains, and fresh fruits and vegetables. Limit foods that are high in fat and sugar. These include fried or sweet foods. Ask your doctor if you should avoid driving or using machines while you are taking your medicine. Injury care     If told, put ice on the painful area. To do this: Put ice in a plastic bag. Place a towel between your skin and the bag. Leave the ice on for 20 minutes, 2-3 times per day. If your skin turns bright red, take off the ice right away to prevent skin damage. The risk of skin damage is higher if you cannot feel pain, heat, or cold. If told, put heat on the painful area. Do this before exercises, or as told by your doctor. Use the heat source that your doctor recommends, such as a moist heat pack or a heating pad. Place a towel between your skin and the heat source. Leave the heat on for 20-30 minutes. If your skin turns bright red, take off the heat right away to prevent burns. The risk of burns is higher if you cannot feel pain, heat, or cold. Massage, stretch, and strengthen the shin area as told by your doctor. Wear compression socks as told by your doctor. Raise (elevate) your legs above the level of your heart while you are sitting or lying down. Activity Rest as needed. Return to activity slowly as told by your doctor. Do not use your shins to support your body weight when you start exercising again.  Try cycling or swimming. Stop running if you have pain. Warm up before you exercise. Run on a flat and firm surface, if possible. Change the intensity of your exercise slowly. Increase your running distance slowly. For example, if you are running 5 miles this week, you should only add - mile to your run next week. General instructions Wear shoes that have good arch support and heel support. Your shoes should also absorb shock. Change your  athletic shoes every 6 months, or every 350-450 miles. Contact a doctor if: Your pain does not get better. Your pain gets worse. The location, intensity, or type of pain you have changes. You have swelling in your lower leg that gets worse. Your shin is red and feels warm. Get help right away if: You have very bad pain. You have trouble walking. Summary Shin splints is a painful condition that is felt in the front or the side of your legs. Medicines, rest, and ice may help control pain. Return to activity slowly as told by your doctor. Make sure to call your doctor if your problems continue or get worse. This information is not intended to replace advice given to you by your health care provider. Make sure you discuss any questions you have with your health care provider. Document Revised: 04/29/2021 Document Reviewed: 04/29/2021 Elsevier Patient Education  2024 [employer].

## 2023-09-27 NOTE — Progress Notes (Unsigned)
 Subjective:   Patient ID: Herbert Walsh, male   DOB: 33 y.o.   MRN: [id number]   HPI Chief Complaint  Patient presents with   Nail Problem    Pt is here due to bilateral toenail fungus and callous on his feet, also concern with the cracking of his heels, also states knots on the back of his ankles that he is concern with.   33 year old male presents the office today with above concerns.  He states he has knots on the back of his heels on the Achilles tendon.  Similar issues in the hands.  He states they do not cause any pain he is not concerned about them.  He also states he has nail fungus as well as calluses to his feet.  No other lesions or injuries.   Review of Systems  All other systems reviewed and are negative.  Past Medical History:  Diagnosis Date   Anemia    Arthritis    rheumatoid   Asthma    Depression    GERD (gastroesophageal reflux disease)    History of COVID-19 03/29/2020   Hypertension 2019   Ischemic colitis (HCC) 09/2008   MSSA (methicillin susceptible Staphylococcus aureus)    sternaoclavicular absecess drainage   Osteoarthritis    sterum and right shoulder   PTSD (post-traumatic stress disorder)    Substance use disorder    Tachycardia     Past Surgical History:  Procedure Laterality Date   APPENDECTOMY     APPLICATION OF WOUND VAC Right 06/20/2019   Procedure: APPLICATION OF WOUND VAC;  Surgeon: Fleeta Hanford Coy, MD;  Location: [facility] OR;  Service: Vascular;  Laterality: Right;   APPLICATION OF WOUND VAC Right 06/23/2019   Procedure: APPLICATION OF WOUND VAC;  Surgeon: Fleeta Hanford Coy, MD;  Location: [facility] OR;  Service: Thoracic;  Laterality: Right;   COLONOSCOPY     x 2   I & D EXTREMITY Right 05/31/2019   Procedure: IRRIGATION AND DEBRIDEMENT LOWER EXTREMITY;  Surgeon: Jerri Kay HERO, MD;  Location: [facility] OR;  Service: Orthopedics;  Laterality: Right;   I & D EXTREMITY Right 06/20/2019   Procedure: DEBRIDEMENT OF STERNOCLAVICULAR JOINT ABSCESS;  Surgeon: Fleeta Hanford Coy, MD;  Location: [facility] OR;  Service: Vascular;  Laterality: Right;   INCISION AND DRAINAGE OF WOUND Right 04/12/2020   Procedure: excision of right sternoclavicular joint infection;  Surgeon: Lowery Estefana RAMAN, DO;  Location: [facility];  Service: Plastics;  Laterality: Right;  45 min total   STERNAL WOUND DEBRIDEMENT Right 09/19/2019   Procedure: EXCISIONAL DEBRIDEMENT AND IRRIGATION OF RIGHT STERNOCLAVICULAR JOINT WOUND;  Surgeon: Fleeta Hanford Coy, MD;  Location: [facility] OR;  Service: Thoracic;  Laterality: Right;   TEE WITHOUT CARDIOVERSION N/A 06/05/2019   Procedure: TRANSESOPHAGEAL ECHOCARDIOGRAM (TEE);  Surgeon: Pietro Redell RAMAN, MD;  Location: [facility] ENDOSCOPY;  Service: Cardiovascular;  Laterality: N/A;   WOUND EXPLORATION Right 06/23/2019   Procedure: irrigation and clean out right shoulder;  Surgeon: Fleeta Hanford Coy, MD;  Location: [facility] OR;  Service: Thoracic;  Laterality: Right;     Current Outpatient Medications:    azelastine  (ASTELIN ) 0.1 % nasal spray, Place 2 sprays into both nostrils daily. Use in each nostril as directed, Disp: 30 mL, Rfl: 12   Cholecalciferol  (VITAMIN D3) 50 MCG (2000 UT) TABS, Take 1 tablet (50 mcg total) by mouth daily., Disp: 30 tablet, Rfl: 2   ciclopirox  (PENLAC ) 8 % solution, Apply topically at bedtime. Apply over nail and surrounding  skin. Apply daily over previous coat. After seven (7) days, may remove with alcohol and continue cycle., Disp: 6.6 mL, Rfl: 2   cloNIDine  (CATAPRES ) 0.2 MG tablet, Take 1 tablet (0.2 mg total) by mouth 3 (three) times daily., Disp: 270 tablet, Rfl: 0   FLUoxetine  (PROZAC ) 20 MG capsule, Take 1 capsule (20 mg total) by mouth daily., Disp: 30 capsule, Rfl: 0   fluticasone  (FLONASE ) 50 MCG/ACT nasal spray, Place 2 sprays into both nostrils daily., Disp: 16 g, Rfl: 6   glipiZIDE -metformin  (METAGLIP) 5-500 MG tablet, Take 1 tablet by mouth daily., Disp: , Rfl:    hydrOXYzine  (ATARAX ) 25 MG tablet, Take 1 tablet (25  mg total) by mouth 3 (three) times daily as needed for anxiety., Disp: 30 tablet, Rfl: 0   lisinopril  (PRINIVIL ,ZESTRIL ) 10 MG tablet, Take 10 mg by mouth daily., Disp: , Rfl:    methotrexate (RHEUMATREX) 2.5 MG tablet, Take 6 tablets by mouth once a week., Disp: , Rfl:    OLANZapine  (ZYPREXA ) 15 MG tablet, Take 1 tablet (15 mg total) by mouth at bedtime., Disp: 30 tablet, Rfl: 0   omeprazole (PRILOSEC) 40 MG capsule, Take 1 capsule by mouth every morning., Disp: , Rfl:    traZODone  (DESYREL ) 50 MG tablet, Take 1 tablet (50 mg total) by mouth at bedtime as needed for sleep., Disp: 30 tablet, Rfl: 0  No Known Allergies        Objective:  Physical Exam  General: AAO x3, NAD  Dermatological: Hyperkeratotic lesion submetatarsal 5 as well as heels without any underlying ulceration, drainage or signs of infection.  No open lesions.  Vascular: Dorsalis Pedis artery and Posterior Tibial artery pedal pulses are 2/4 bilateral with immedate capillary fill time. There is no pain with calf compression, swelling, warmth, erythema.   Neruologic: Grossly intact via light touch bilateral.   Musculoskeletal: Nodules present on the Achilles tendon bilaterally.  Multiple nodules are present.  There is no pain.  Clinically the Achilles tendon appears to be intact.  Gait: Unassisted, Nonantalgic.       Assessment:   Soft tissue bilateral Achilles tendon, onychomycosis, hyperkeratotic lesions     Plan:  -Treatment options discussed including all alternatives, risks, and complications -Etiology of symptoms were discussed -X-rays were obtained and reviewed with the patient.  Multiple views bilateral ankles were obtained.  No evidence of acute fracture.  Soft tissue density noted along the Achilles tendon consistent with soft tissue masses but there is no calcifications identified. -We discussed with conservative as well as surgical options for the soft tissue masses.  They are not causing any pain.  For  treatment options but he wants to continue to monitor for now. -Prescribed Penlac  for toenails. -Discussed moisturizer for the calluses, offloading.  Donnice JONELLE Fees DPM

## 2023-12-13 ENCOUNTER — Ambulatory Visit (INDEPENDENT_AMBULATORY_CARE_PROVIDER_SITE_OTHER): Payer: MEDICAID | Admitting: Podiatry

## 2023-12-13 ENCOUNTER — Ambulatory Visit (INDEPENDENT_AMBULATORY_CARE_PROVIDER_SITE_OTHER): Payer: MEDICAID

## 2023-12-13 VITALS — Ht 71.0 in | Wt 247.0 lb

## 2023-12-13 DIAGNOSIS — L03032 Cellulitis of left toe: Secondary | ICD-10-CM | POA: Diagnosis not present

## 2023-12-13 DIAGNOSIS — M79672 Pain in left foot: Secondary | ICD-10-CM

## 2023-12-13 DIAGNOSIS — S92422A Displaced fracture of distal phalanx of left great toe, initial encounter for closed fracture: Secondary | ICD-10-CM | POA: Diagnosis not present

## 2023-12-13 MED ORDER — CEPHALEXIN 500 MG PO CAPS: 500.0000 mg | ORAL_CAPSULE | Freq: Three times a day (TID) | ORAL | 0 refills | Status: DC

## 2023-12-13 NOTE — Patient Instructions (Signed)

## 2023-12-14 ENCOUNTER — Other Ambulatory Visit (HOSPITAL_COMMUNITY): Payer: Self-pay

## 2023-12-14 DIAGNOSIS — G8929 Other chronic pain: Secondary | ICD-10-CM

## 2023-12-14 NOTE — Progress Notes (Signed)
  Subjective:  Patient ID: Herbert Walsh, male    DOB: Dec 02, 1990,  MRN: [id number]  Chief Complaint  Patient presents with   Toe Injury    Rm 2 Patient is here for an injury to left hallux. Pt dropped an object on the left hallux, nail has splt, with bruising and swelling.    33 y.o. male presents with the above complaint. History confirmed with patient.   Objective:  Physical Exam: warm, good capillary refill, no trophic changes or ulcerative lesions, normal DP and PT pulses, normal sensory exam, and painful bruised and swollen left hallux, there is erythema and drainage from the nailbed.     Radiographs: Multiple views x-ray of the left foot: Nondisplaced distal tuft fracture Assessment:   1. Displaced fracture of distal phalanx of left great toe, initial encounter for closed fracture   2. Paronychia of great toe, left      Plan:  Patient was evaluated and treated and all questions answered.  We reviewed his x-rays discussed the presence of the fracture and paronychia of the nailbed from the nailbed injury.  I recommended nonoperative treatment with close treatment with a surgical shoe which we dispensed today for the fracture.  Follow-up in 6 weeks for x-rays for this.  Also recommend removal of the left hallux nail plate.  Following consent the left hallux was anesthetized with lidocaine  and Marcaine.  It was prepped with Betadine exsanguinated and a tourniquet secured around the base of the toe.  The nail plate was removed and irrigated with alcohol and Silvadene and compression sterile bandage applied.  Follow-up as needed for this sooner if he has problems with the toenail.  1 week course of Keflex  sent to pharmacy.  Return in about 6 weeks (around 01/24/2024) for fracture follow up (new xrays).

## 2024-01-24 ENCOUNTER — Ambulatory Visit: Payer: MEDICAID | Admitting: Podiatry

## 2024-01-31 ENCOUNTER — Other Ambulatory Visit: Payer: Self-pay

## 2024-01-31 ENCOUNTER — Encounter (HOSPITAL_COMMUNITY): Payer: Self-pay

## 2024-01-31 DIAGNOSIS — F319 Bipolar disorder, unspecified: Secondary | ICD-10-CM | POA: Diagnosis present

## 2024-01-31 DIAGNOSIS — R7401 Elevation of levels of liver transaminase levels: Secondary | ICD-10-CM | POA: Diagnosis present

## 2024-01-31 DIAGNOSIS — G8929 Other chronic pain: Secondary | ICD-10-CM | POA: Diagnosis present

## 2024-01-31 DIAGNOSIS — E872 Acidosis, unspecified: Secondary | ICD-10-CM | POA: Diagnosis present

## 2024-01-31 DIAGNOSIS — N179 Acute kidney failure, unspecified: Principal | ICD-10-CM | POA: Diagnosis present

## 2024-01-31 DIAGNOSIS — M1A9XX0 Chronic gout, unspecified, without tophus (tophi): Secondary | ICD-10-CM | POA: Diagnosis present

## 2024-01-31 DIAGNOSIS — Z8616 Personal history of COVID-19: Secondary | ICD-10-CM

## 2024-01-31 DIAGNOSIS — I10 Essential (primary) hypertension: Secondary | ICD-10-CM | POA: Diagnosis present

## 2024-01-31 DIAGNOSIS — K529 Noninfective gastroenteritis and colitis, unspecified: Secondary | ICD-10-CM | POA: Diagnosis present

## 2024-01-31 DIAGNOSIS — K219 Gastro-esophageal reflux disease without esophagitis: Secondary | ICD-10-CM | POA: Diagnosis present

## 2024-01-31 DIAGNOSIS — Z6834 Body mass index (BMI) 34.0-34.9, adult: Secondary | ICD-10-CM

## 2024-01-31 DIAGNOSIS — E861 Hypovolemia: Secondary | ICD-10-CM | POA: Diagnosis present

## 2024-01-31 DIAGNOSIS — F4310 Post-traumatic stress disorder, unspecified: Secondary | ICD-10-CM | POA: Diagnosis present

## 2024-01-31 DIAGNOSIS — A419 Sepsis, unspecified organism: Principal | ICD-10-CM | POA: Diagnosis present

## 2024-01-31 DIAGNOSIS — E871 Hypo-osmolality and hyponatremia: Secondary | ICD-10-CM | POA: Diagnosis present

## 2024-01-31 DIAGNOSIS — Z791 Long term (current) use of non-steroidal anti-inflammatories (NSAID): Secondary | ICD-10-CM

## 2024-01-31 DIAGNOSIS — E1165 Type 2 diabetes mellitus with hyperglycemia: Secondary | ICD-10-CM | POA: Diagnosis present

## 2024-01-31 DIAGNOSIS — Z1152 Encounter for screening for COVID-19: Secondary | ICD-10-CM

## 2024-01-31 DIAGNOSIS — D84821 Immunodeficiency due to drugs: Secondary | ICD-10-CM | POA: Diagnosis present

## 2024-01-31 DIAGNOSIS — Z79899 Other long term (current) drug therapy: Secondary | ICD-10-CM

## 2024-01-31 DIAGNOSIS — Z79631 Long term (current) use of antimetabolite agent: Secondary | ICD-10-CM

## 2024-01-31 DIAGNOSIS — E669 Obesity, unspecified: Secondary | ICD-10-CM | POA: Diagnosis present

## 2024-01-31 DIAGNOSIS — M069 Rheumatoid arthritis, unspecified: Secondary | ICD-10-CM | POA: Diagnosis present

## 2024-01-31 DIAGNOSIS — F17210 Nicotine dependence, cigarettes, uncomplicated: Secondary | ICD-10-CM | POA: Diagnosis present

## 2024-01-31 DIAGNOSIS — Z9049 Acquired absence of other specified parts of digestive tract: Secondary | ICD-10-CM

## 2024-01-31 NOTE — ED Triage Notes (Signed)
 Pov from home. Cc of fever (didn't check), n/v/d 2 days ago. Unable to keep anything down  No one sick at home  Tried taking meds but cannot keep them down

## 2024-02-01 ENCOUNTER — Emergency Department (HOSPITAL_COMMUNITY): Payer: MEDICAID

## 2024-02-01 ENCOUNTER — Inpatient Hospital Stay (HOSPITAL_COMMUNITY)
Admission: EM | Admit: 2024-02-01 | Discharge: 2024-02-03 | DRG: 872 | Disposition: A | Discharge status: Home / Self Care | Payer: MEDICAID | Attending: Family Medicine | Admitting: Family Medicine

## 2024-02-01 DIAGNOSIS — M069 Rheumatoid arthritis, unspecified: Secondary | ICD-10-CM | POA: Diagnosis present

## 2024-02-01 DIAGNOSIS — R6510 Systemic inflammatory response syndrome (SIRS) of non-infectious origin without acute organ dysfunction: Secondary | ICD-10-CM | POA: Diagnosis present

## 2024-02-01 DIAGNOSIS — E871 Hypo-osmolality and hyponatremia: Secondary | ICD-10-CM | POA: Diagnosis present

## 2024-02-01 DIAGNOSIS — K529 Noninfective gastroenteritis and colitis, unspecified: Secondary | ICD-10-CM | POA: Diagnosis present

## 2024-02-01 DIAGNOSIS — E872 Acidosis, unspecified: Secondary | ICD-10-CM | POA: Diagnosis present

## 2024-02-01 DIAGNOSIS — F1920 Other psychoactive substance dependence, uncomplicated: Secondary | ICD-10-CM | POA: Diagnosis present

## 2024-02-01 DIAGNOSIS — E669 Obesity, unspecified: Secondary | ICD-10-CM | POA: Diagnosis present

## 2024-02-01 DIAGNOSIS — K219 Gastro-esophageal reflux disease without esophagitis: Secondary | ICD-10-CM | POA: Diagnosis present

## 2024-02-01 DIAGNOSIS — Z720 Tobacco use: Secondary | ICD-10-CM | POA: Diagnosis not present

## 2024-02-01 DIAGNOSIS — Z8616 Personal history of COVID-19: Secondary | ICD-10-CM | POA: Diagnosis not present

## 2024-02-01 DIAGNOSIS — Z791 Long term (current) use of non-steroidal anti-inflammatories (NSAID): Secondary | ICD-10-CM

## 2024-02-01 DIAGNOSIS — M1A9XX0 Chronic gout, unspecified, without tophus (tophi): Secondary | ICD-10-CM | POA: Diagnosis present

## 2024-02-01 DIAGNOSIS — F1994 Other psychoactive substance use, unspecified with psychoactive substance-induced mood disorder: Secondary | ICD-10-CM | POA: Diagnosis present

## 2024-02-01 DIAGNOSIS — F6381 Intermittent explosive disorder: Secondary | ICD-10-CM | POA: Diagnosis present

## 2024-02-01 DIAGNOSIS — F319 Bipolar disorder, unspecified: Secondary | ICD-10-CM | POA: Diagnosis present

## 2024-02-01 DIAGNOSIS — R454 Irritability and anger: Secondary | ICD-10-CM | POA: Diagnosis present

## 2024-02-01 DIAGNOSIS — E1165 Type 2 diabetes mellitus with hyperglycemia: Secondary | ICD-10-CM | POA: Diagnosis present

## 2024-02-01 DIAGNOSIS — E860 Dehydration: Secondary | ICD-10-CM

## 2024-02-01 DIAGNOSIS — F17210 Nicotine dependence, cigarettes, uncomplicated: Secondary | ICD-10-CM | POA: Diagnosis present

## 2024-02-01 DIAGNOSIS — A419 Sepsis, unspecified organism: Secondary | ICD-10-CM | POA: Diagnosis present

## 2024-02-01 DIAGNOSIS — E861 Hypovolemia: Secondary | ICD-10-CM | POA: Diagnosis present

## 2024-02-01 DIAGNOSIS — Z79631 Long term (current) use of antimetabolite agent: Secondary | ICD-10-CM | POA: Diagnosis not present

## 2024-02-01 DIAGNOSIS — N179 Acute kidney failure, unspecified: Secondary | ICD-10-CM | POA: Diagnosis present

## 2024-02-01 DIAGNOSIS — D84821 Immunodeficiency due to drugs: Secondary | ICD-10-CM | POA: Diagnosis present

## 2024-02-01 DIAGNOSIS — Z1152 Encounter for screening for COVID-19: Secondary | ICD-10-CM | POA: Diagnosis not present

## 2024-02-01 DIAGNOSIS — I10 Essential (primary) hypertension: Secondary | ICD-10-CM | POA: Diagnosis present

## 2024-02-01 DIAGNOSIS — F4310 Post-traumatic stress disorder, unspecified: Secondary | ICD-10-CM | POA: Diagnosis present

## 2024-02-01 DIAGNOSIS — G8929 Other chronic pain: Secondary | ICD-10-CM | POA: Diagnosis present

## 2024-02-01 DIAGNOSIS — Z9049 Acquired absence of other specified parts of digestive tract: Secondary | ICD-10-CM | POA: Diagnosis not present

## 2024-02-01 DIAGNOSIS — Z6834 Body mass index (BMI) 34.0-34.9, adult: Secondary | ICD-10-CM | POA: Diagnosis not present

## 2024-02-01 LAB — URINALYSIS, ROUTINE W REFLEX MICROSCOPIC
Bacteria, UA: NONE SEEN
Bilirubin Urine: NEGATIVE
Glucose, UA: 50 mg/dL — AB
Hgb urine dipstick: NEGATIVE
Ketones, ur: NEGATIVE mg/dL
Leukocytes,Ua: NEGATIVE
Nitrite: NEGATIVE
Protein, ur: 100 mg/dL — AB
Specific Gravity, Urine: 1.029 (ref 1.005–1.030)
pH: 5 (ref 5.0–8.0)

## 2024-02-01 LAB — COMPREHENSIVE METABOLIC PANEL WITH GFR
ALT: 42 U/L (ref 0–44)
ALT: 36 U/L (ref 0–44)
AST: 43 U/L — ABNORMAL HIGH (ref 15–41)
AST: 34 U/L (ref 15–41)
Albumin: 5.2 g/dL — ABNORMAL HIGH (ref 3.5–5.0)
Albumin: 4.6 g/dL (ref 3.5–5.0)
Alkaline Phosphatase: 125 U/L (ref 38–126)
Alkaline Phosphatase: 101 U/L (ref 38–126)
Anion gap: 18 — ABNORMAL HIGH (ref 5–15)
Anion gap: 17 — ABNORMAL HIGH (ref 5–15)
BUN: 27 mg/dL — ABNORMAL HIGH (ref 6–20)
BUN: 27 mg/dL — ABNORMAL HIGH (ref 6–20)
CO2: 24 mmol/L (ref 22–32)
CO2: 19 mmol/L — ABNORMAL LOW (ref 22–32)
Calcium: 10.5 mg/dL — ABNORMAL HIGH (ref 8.9–10.3)
Calcium: 9.1 mg/dL (ref 8.9–10.3)
Chloride: 85 mmol/L — ABNORMAL LOW (ref 98–111)
Chloride: 90 mmol/L — ABNORMAL LOW (ref 98–111)
Creatinine, Ser: 1.33 mg/dL — ABNORMAL HIGH (ref 0.61–1.24)
Creatinine, Ser: 1.12 mg/dL (ref 0.61–1.24)
GFR, Estimated: >60 mL/min
GFR, Estimated: >60 mL/min
Glucose, Bld: 218 mg/dL — ABNORMAL HIGH (ref 70–99)
Glucose, Bld: 168 mg/dL — ABNORMAL HIGH (ref 70–99)
Potassium: 4.3 mmol/L (ref 3.5–5.1)
Potassium: 4.3 mmol/L (ref 3.5–5.1)
Sodium: 126 mmol/L — ABNORMAL LOW (ref 135–145)
Sodium: 127 mmol/L — ABNORMAL LOW (ref 135–145)
Total Bilirubin: 0.9 mg/dL (ref 0.0–1.2)
Total Bilirubin: 0.7 mg/dL (ref 0.0–1.2)
Total Protein: 8.5 g/dL — ABNORMAL HIGH (ref 6.5–8.1)
Total Protein: 7.3 g/dL (ref 6.5–8.1)

## 2024-02-01 LAB — CBC
HCT: 45.4 % (ref 39.0–52.0)
Hemoglobin: 16.1 g/dL (ref 13.0–17.0)
MCH: 28.5 pg (ref 26.0–34.0)
MCHC: 35.5 g/dL (ref 30.0–36.0)
MCV: 80.4 fL (ref 80.0–100.0)
Platelets: 370 K/uL (ref 150–400)
RBC: 5.65 MIL/uL (ref 4.22–5.81)
RDW: 14.3 % (ref 11.5–15.5)
WBC: 21 K/uL — ABNORMAL HIGH (ref 4.0–10.5)
nRBC: 0 % (ref 0.0–0.2)

## 2024-02-01 LAB — LACTIC ACID, PLASMA
Lactic Acid, Venous: 2.9 mmol/L (ref 0.5–1.9)
Lactic Acid, Venous: 3.1 mmol/L (ref 0.5–1.9)

## 2024-02-01 LAB — CBC WITH DIFFERENTIAL/PLATELET
Abs Immature Granulocytes: 0.08 K/uL — ABNORMAL HIGH (ref 0.00–0.07)
Basophils Absolute: 0 K/uL (ref 0.0–0.1)
Basophils Relative: 0 %
Eosinophils Absolute: 0 K/uL (ref 0.0–0.5)
Eosinophils Relative: 0 %
HCT: 38.5 % — ABNORMAL LOW (ref 39.0–52.0)
Hemoglobin: 13.7 g/dL (ref 13.0–17.0)
Immature Granulocytes: 1 %
Lymphocytes Relative: 14 %
Lymphs Abs: 2 K/uL (ref 0.7–4.0)
MCH: 28.7 pg (ref 26.0–34.0)
MCHC: 35.6 g/dL (ref 30.0–36.0)
MCV: 80.7 fL (ref 80.0–100.0)
Monocytes Absolute: 1.1 K/uL — ABNORMAL HIGH (ref 0.1–1.0)
Monocytes Relative: 8 %
Neutro Abs: 11 K/uL — ABNORMAL HIGH (ref 1.7–7.7)
Neutrophils Relative %: 77 %
Platelets: 266 K/uL (ref 150–400)
RBC: 4.77 MIL/uL (ref 4.22–5.81)
RDW: 14.4 % (ref 11.5–15.5)
WBC: 14.2 K/uL — ABNORMAL HIGH (ref 4.0–10.5)
nRBC: 0 % (ref 0.0–0.2)

## 2024-02-01 LAB — RESP PANEL BY RT-PCR (RSV, FLU A&B, COVID)  RVPGX2
Influenza A by PCR: NEGATIVE
Influenza B by PCR: NEGATIVE
Resp Syncytial Virus by PCR: NEGATIVE
SARS Coronavirus 2 by RT PCR: NEGATIVE

## 2024-02-01 LAB — CBG MONITORING, ED
Glucose-Capillary: 159 mg/dL — ABNORMAL HIGH (ref 70–99)
Glucose-Capillary: 148 mg/dL — ABNORMAL HIGH (ref 70–99)

## 2024-02-01 LAB — LACTATE DEHYDROGENASE: LDH: 171 U/L (ref 105–235)

## 2024-02-01 LAB — GLUCOSE, CAPILLARY
Glucose-Capillary: 153 mg/dL — ABNORMAL HIGH (ref 70–99)
Glucose-Capillary: 172 mg/dL — ABNORMAL HIGH (ref 70–99)

## 2024-02-01 LAB — HEMOGLOBIN A1C
Hgb A1c MFr Bld: 5.9 % — ABNORMAL HIGH (ref 4.8–5.6)
Mean Plasma Glucose: 122.63 mg/dL

## 2024-02-01 LAB — HIV ANTIBODY (ROUTINE TESTING W REFLEX): HIV Screen 4th Generation wRfx: NONREACTIVE

## 2024-02-01 LAB — MAGNESIUM: Magnesium: 1.8 mg/dL (ref 1.7–2.4)

## 2024-02-01 LAB — PHOSPHORUS: Phosphorus: 2.4 mg/dL — ABNORMAL LOW (ref 2.5–4.6)

## 2024-02-01 LAB — LIPASE, BLOOD: Lipase: 19 U/L (ref 11–51)

## 2024-02-01 MED ORDER — HYDROMORPHONE HCL 1 MG/ML IJ SOLN
0.5000 mg | INTRAMUSCULAR | Status: AC | PRN
  Administered 2024-02-01 – 2024-02-02 (×6): 0.5 mg via INTRAVENOUS
  Filled 2024-02-01 (×6): qty 0.5

## 2024-02-01 MED ORDER — METRONIDAZOLE 500 MG/100ML IV SOLN
500.0000 mg | Freq: Two times a day (BID) | INTRAVENOUS | Status: AC
  Administered 2024-02-01 – 2024-02-02 (×4): 500 mg via INTRAVENOUS
  Filled 2024-02-01 (×4): qty 100

## 2024-02-01 MED ORDER — IOHEXOL 300 MG/ML  SOLN
100.0000 mL | Freq: Once | INTRAMUSCULAR | Status: AC | PRN
  Administered 2024-02-01: 100 mL via INTRAVENOUS

## 2024-02-01 MED ORDER — SODIUM CHLORIDE 0.9 % IV SOLN: INTRAVENOUS | Status: AC

## 2024-02-01 MED ORDER — SODIUM CHLORIDE 0.9 % IV SOLN
2.0000 g | Freq: Three times a day (TID) | INTRAVENOUS | Status: AC
  Administered 2024-02-01 – 2024-02-02 (×6): 2 g via INTRAVENOUS
  Filled 2024-02-01 (×6): qty 12.5

## 2024-02-01 MED ORDER — SODIUM CHLORIDE 0.9 % IV BOLUS
1000.0000 mL | Freq: Once | INTRAVENOUS | Status: AC
  Administered 2024-02-01: 1000 mL via INTRAVENOUS

## 2024-02-01 MED ORDER — ENOXAPARIN SODIUM 60 MG/0.6ML IJ SOSY
50.0000 mg | PREFILLED_SYRINGE | INTRAMUSCULAR | Status: DC
  Filled 2024-02-01: qty 0.6

## 2024-02-01 MED ORDER — VANCOMYCIN HCL 1500 MG/300ML IV SOLN
1500.0000 mg | Freq: Two times a day (BID) | INTRAVENOUS | Status: AC
  Administered 2024-02-01 – 2024-02-02 (×3): 1500 mg via INTRAVENOUS
  Filled 2024-02-01 (×3): qty 300

## 2024-02-01 MED ORDER — VANCOMYCIN HCL 2000 MG/400ML IV SOLN
2000.0000 mg | Freq: Once | INTRAVENOUS | Status: AC
  Administered 2024-02-01: 2000 mg via INTRAVENOUS
  Filled 2024-02-01: qty 400

## 2024-02-01 MED ORDER — INSULIN ASPART 100 UNIT/ML IJ SOLN: 0.0000 [IU] | Freq: Every day | INTRAMUSCULAR | Status: DC

## 2024-02-01 MED ORDER — POLYETHYLENE GLYCOL 3350 17 G PO PACK: 17.0000 g | PACK | Freq: Every day | ORAL | Status: DC | PRN

## 2024-02-01 MED ORDER — PROCHLORPERAZINE EDISYLATE 10 MG/2ML IJ SOLN: 5.0000 mg | Freq: Four times a day (QID) | INTRAMUSCULAR | Status: DC | PRN

## 2024-02-01 MED ORDER — ONDANSETRON HCL 4 MG/2ML IJ SOLN
4.0000 mg | Freq: Once | INTRAMUSCULAR | Status: AC
  Administered 2024-02-01: 4 mg via INTRAVENOUS
  Filled 2024-02-01: qty 2

## 2024-02-01 MED ORDER — INSULIN ASPART 100 UNIT/ML IJ SOLN
0.0000 [IU] | Freq: Three times a day (TID) | INTRAMUSCULAR | Status: DC
  Administered 2024-02-01: 1 [IU] via SUBCUTANEOUS
  Administered 2024-02-01 (×2): 2 [IU] via SUBCUTANEOUS
  Administered 2024-02-02 (×2): 1 [IU] via SUBCUTANEOUS
  Administered 2024-02-02: 2 [IU] via SUBCUTANEOUS
  Filled 2024-02-01 (×6): qty 1

## 2024-02-01 MED ORDER — OXYCODONE HCL 5 MG PO TABS
5.0000 mg | ORAL_TABLET | ORAL | Status: AC | PRN
  Administered 2024-02-01 – 2024-02-02 (×6): 10 mg via ORAL
  Filled 2024-02-01 (×6): qty 2

## 2024-02-01 MED ORDER — MELATONIN 5 MG PO TABS: 5.0000 mg | ORAL_TABLET | Freq: Every evening | ORAL | Status: DC | PRN

## 2024-02-01 MED ORDER — KETOROLAC TROMETHAMINE 30 MG/ML IJ SOLN
30.0000 mg | Freq: Once | INTRAMUSCULAR | Status: AC
  Administered 2024-02-01: 30 mg via INTRAVENOUS
  Filled 2024-02-01: qty 1

## 2024-02-01 MED ORDER — ACETAMINOPHEN 500 MG PO TABS
500.0000 mg | ORAL_TABLET | Freq: Four times a day (QID) | ORAL | Status: DC | PRN
  Administered 2024-02-02: 500 mg via ORAL
  Filled 2024-02-01: qty 1

## 2024-02-01 NOTE — ED Provider Notes (Signed)
 " [place] EMERGENCY DEPARTMENT AT [facility] Provider Note   CSN: [id number] Arrival date & time: 01/31/24  2330     Patient presents with: Fever   Herbert Walsh is a 33 y.o. male.   Patient is a 33 year old male with history of osteomyelitis, bipolar disorder, prior appendectomy.  Patient presenting today with complaints of nausea, vomiting, and diarrhea.  Symptoms began 2 days ago.  He denies any bloody vomit or stool.  He does report some discomfort to his upper abdomen.  He has felt chilled at home, but has not taken his temperature.  He denies any ill contacts.       Prior to Admission medications  Medication Sig Start Date End Date Taking? Authorizing Provider  azelastine  (ASTELIN ) 0.1 % nasal spray Place 2 sprays into both nostrils daily. Use in each nostril as directed 07/05/23   Tobie Eldora NOVAK, MD  cephALEXin  (KEFLEX ) 500 MG capsule Take 1 capsule (500 mg total) by mouth 3 (three) times daily. 12/13/23   McDonald, Juliene SAUNDERS, DPM  Cholecalciferol  (VITAMIN D3) 50 MCG (2000 UT) TABS Take 1 tablet (50 mcg total) by mouth daily. 06/20/22   Pearlean Manus, MD  ciclopirox  (PENLAC ) 8 % solution Apply topically at bedtime. Apply over nail and surrounding skin. Apply daily over previous coat. After seven (7) days, may remove with alcohol and continue cycle. 09/27/23   Gershon Donnice SAUNDERS, DPM  cloNIDine  (CATAPRES ) 0.2 MG tablet Take 1 tablet (0.2 mg total) by mouth 3 (three) times daily. 10/03/22   Vickey Mettle, MD  FLUoxetine  (PROZAC ) 20 MG capsule Take 1 capsule (20 mg total) by mouth daily. 05/31/23   Evelena Figures, MD  fluticasone  (FLONASE ) 50 MCG/ACT nasal spray Place 2 sprays into both nostrils daily. 07/05/23   Tobie Eldora NOVAK, MD  glipiZIDE -metformin  (METAGLIP) 5-500 MG tablet Take 1 tablet by mouth daily.    [provider]  hydrOXYzine  (ATARAX ) 25 MG tablet Take 1 tablet (25 mg total) by mouth 3 (three) times daily as needed for anxiety. 05/30/23   Evelena Figures, MD  lisinopril  (PRINIVIL ,ZESTRIL ) 10 MG tablet Take 10 mg by mouth daily.    [provider]  methotrexate (RHEUMATREX) 2.5 MG tablet Take 6 tablets by mouth once a week. 02/07/22   [provider]  OLANZapine  (ZYPREXA ) 15 MG tablet Take 1 tablet (15 mg total) by mouth at bedtime. 05/30/23   Evelena Figures, MD  omeprazole (PRILOSEC) 40 MG capsule Take 1 capsule by mouth every morning. 05/25/20   [provider]  traZODone  (DESYREL ) 50 MG tablet Take 1 tablet (50 mg total) by mouth at bedtime as needed for sleep. 05/30/23   Evelena Figures, MD    Allergies: Patient has no known allergies.    Review of Systems  All other systems reviewed and are negative.   Updated Vital Signs BP (!) 146/107 (BP Location: Right Arm)   Pulse (!) 140   Temp 98.9 F (37.2 C) (Oral)   Resp 18   Ht 5' 11 (1.803 m)   Wt 112 kg   SpO2 100%   BMI 34.44 kg/m   Physical Exam Vitals and nursing note reviewed.  Constitutional:      General: He is not in acute distress.    Appearance: He is well-developed. He is not diaphoretic.  HENT:     Head: Normocephalic and atraumatic.  Cardiovascular:     Rate and Rhythm: Normal rate and regular rhythm.     Heart sounds: No  murmur heard.    No friction rub.  Pulmonary:     Effort: Pulmonary effort is normal. No respiratory distress.     Breath sounds: Normal breath sounds. No wheezing or rales.  Abdominal:     General: Bowel sounds are normal. There is no distension.     Palpations: Abdomen is soft.     Tenderness: There is no abdominal tenderness.  Musculoskeletal:        General: Normal range of motion.     Cervical back: Normal range of motion and neck supple.  Skin:    General: Skin is warm and dry.  Neurological:     Mental Status: He is alert and oriented to person, place, and time.     Coordination: Coordination normal.     (all labs ordered are listed, but only abnormal results are displayed) Labs Reviewed   COMPREHENSIVE METABOLIC PANEL WITH GFR - Abnormal; Notable for the following components:      Result Value   Sodium 126 (*)    Chloride 85 (*)    Glucose, Bld 218 (*)    BUN 27 (*)    Creatinine, Ser 1.33 (*)    Calcium  10.5 (*)    Total Protein 8.5 (*)    Albumin  5.2 (*)    AST 43 (*)    Anion gap 18 (*)    All other components within normal limits  CBC - Abnormal; Notable for the following components:   WBC 21.0 (*)    All other components within normal limits  RESP PANEL BY RT-PCR (RSV, FLU A&B, COVID)  RVPGX2  LIPASE, BLOOD  URINALYSIS, ROUTINE W REFLEX MICROSCOPIC  LACTIC ACID, PLASMA  LACTIC ACID, PLASMA    EKG: None  Radiology: No results found.   Procedures   Medications Ordered in the ED  ondansetron  (ZOFRAN ) injection 4 mg (has no administration in time range)  ketorolac  (TORADOL ) 30 MG/ML injection 30 mg (has no administration in time range)  sodium chloride  0.9 % bolus 1,000 mL (1,000 mLs Intravenous New Bag/Given 02/01/24 0148)                                    Medical Decision Making Amount and/or Complexity of Data Reviewed Labs: ordered. Radiology: ordered.  Risk Prescription drug management. Decision regarding hospitalization.   Patient is a 33 year old male presenting with complaints of bodyaches, chills, nausea, vomiting, diarrhea, and weakness worsening over the past 2 days.  Patient arrives here with stable vital signs and is afebrile.  Physical examination reveals unremarkable results.  Laboratory studies obtained including CBC, CMP, and lactate.  White count is 21,000 with a lactate of 2.9.  BUN is 27 and creatinine is 1.3 with anion gap of 18.  Patient has been hydrated with normal saline and given medication for nausea.  Given the degree of leukocytosis and lactate elevation, I feel as though patient will require admission for further hydration and observation.       Final diagnoses:  None    ED Discharge Orders     None           Geroldine Berg, MD 02/01/24 [phone]  "

## 2024-02-01 NOTE — TOC CM/SW Note (Signed)
 Transition of Care [facility]) - Inpatient Brief Assessment   Patient Details  Name: Herbert Walsh MRN: [id number] Date of Birth: 04-Sep-1990  [facility] CM/SW Contact:    Lucie Lunger, LCSWA Phone Number: 02/01/2024, 8:39 AM   Clinical Narrative: Transition of Care Department [facility]) has reviewed patient and no TOC needs have been identified at this time. We will continue to monitor patient advancement through interdiciplinary progression rounds. If new patient transition needs arise, please place a TOC consult.  Transition of Care Asessment: Insurance and Status: Insurance coverage has been reviewed Patient has primary care physician: Yes Home environment has been reviewed: From home Prior level of function:: Indpendent Prior/Current Home Services: No current home services Social Drivers of Health Review: SDOH reviewed no interventions necessary Readmission risk has been reviewed: Yes Transition of care needs: no transition of care needs at this time

## 2024-02-01 NOTE — Progress Notes (Signed)
 Pharmacy Antibiotic Note  Herbert Walsh is a 33 y.o. male admitted on 02/01/2024 with N/V/D.  Pharmacy has been consulted for cefepime /vancomycin  dosing for sepsis.  -CT: no acute findings in abdomen/pelvis -WBC 14, sCr 1.12 (bl~0.8-1), afebrile -Blood cultures collected  Plan: -Cefepime  2g IV every 8 hours -Vancomycin  2g IV x1 -Vancomycin  1500mg  IV every 12 hours (AUC 433, IBW, Vd 0.72, sCr 1.12) -Monitor renal function -Follow up signs of clinical improvement, LOT, de-escalation of antibiotics   Height: 5' 11 (180.3 cm) Weight: 112 kg (246 lb 14.6 oz) IBW/kg (Calculated) : 75.3  Temp (24hrs), Avg:98.4 F (36.9 C), Min:97.8 F (36.6 C), Max:98.9 F (37.2 C)  Recent Labs  Lab 02/01/24 0021 02/01/24 0237 02/01/24 0418  WBC 21.0*  --  14.2*  CREATININE 1.33*  --  1.12  LATICACIDVEN 2.9* 3.1*  --     Estimated Creatinine Clearance: 117.2 mL/min (by C-G formula based on SCr of 1.12 mg/dL).    Allergies[1]  Antimicrobials this admission: Cefepime  12/19 >>  Vancomycin  12/19 >>   Microbiology results: 12/19 BCx:  12/19 MRSA PCR: sent  Thank you for allowing pharmacy to be a part of this patients care.  Lynwood Poplar, PharmD, BCPS Clinical Pharmacist 02/01/2024 5:03 AM       [1] No Known Allergies

## 2024-02-01 NOTE — ED Notes (Signed)
 Patient transported to CT

## 2024-02-01 NOTE — Plan of Care (Signed)
  Problem: Education: Goal: Ability to describe self-care measures that may prevent or decrease complications (Diabetes Survival Skills Education) will improve Outcome: Progressing   Problem: Fluid Volume: Goal: Ability to maintain a balanced intake and output will improve Outcome: Progressing   Problem: Health Behavior/Discharge Planning: Goal: Ability to identify and utilize available resources and services will improve Outcome: Progressing

## 2024-02-01 NOTE — H&P (Signed)
 " History and Physical  Herbert Walsh FMW:[id number] DOB: 07/05/1990 DOA: 02/01/2024  Referring physician: Dr. Geroldine, EDP  PCP: [facility]  Outpatient Specialists: Rheumatology. Patient coming from: Home.  Chief Complaint: Intractable nausea and vomiting.  Shaking chills, unable to keep anything down for the past 2 days.  HPI: Herbert Walsh is a 33 y.o. male with medical history significant for rheumatoid arthritis on methotrexate, chronic gout, history of osteomyelitis, prior appendectomy, bipolar disorder, hypertension, type 2 diabetes, osteoarthritis, who presented to the ER due to subjective fevers at home, shaking chills, nausea, vomiting, and diarrhea for the past 2 days.  Associated with some discomfort in his upper abdomen.  Unable to keep anything down.  Denies exposure to any sick contacts.  In the ER, tachycardic with heart rate in the 140s and improved with IV fluid boluses NS 1 L x 2, and IV antiemetics.  Lab work was notable for leukocytosis 21,000, lactic acid 2.9, 3.1.  Labs were also remarkable for serum sodium 126 chloride 85, serum glucose 218, BUN 27, creatinine 1.33, calcium  10.5, anion gap 18, AST 43.  CT abdomen and pelvis with contrast revealed no acute findings in the abdomen or pelvis.  Due to concern for AKI and possible active infective process, gastroenteritis, EDP requesting admission for further management.  Admitted by [facility], hospitalist service.  ED Course: Temperature 97.8.  BP 123/85, pulse 117, respiration rate 18, O2 saturation 96% on room air.  Review of Systems: Review of systems as noted in the HPI. All other systems reviewed and are negative.   Past Medical History:  Diagnosis Date   Anemia    Arthritis    rheumatoid   Asthma    Depression    GERD (gastroesophageal reflux disease)    History of COVID-19 03/29/2020   Hypertension 2019   Ischemic colitis 09/2008   MSSA (methicillin susceptible Staphylococcus aureus)     sternaoclavicular absecess drainage   Osteoarthritis    sterum and right shoulder   PTSD (post-traumatic stress disorder)    Substance use disorder    Tachycardia    Past Surgical History:  Procedure Laterality Date   APPENDECTOMY     APPLICATION OF WOUND VAC Right 06/20/2019   Procedure: APPLICATION OF WOUND VAC;  Surgeon: Fleeta Hanford Coy, MD;  Location: [facility] OR;  Service: Vascular;  Laterality: Right;   APPLICATION OF WOUND VAC Right 06/23/2019   Procedure: APPLICATION OF WOUND VAC;  Surgeon: Fleeta Hanford Coy, MD;  Location: [facility] OR;  Service: Thoracic;  Laterality: Right;   COLONOSCOPY     x 2   I & D EXTREMITY Right 05/31/2019   Procedure: IRRIGATION AND DEBRIDEMENT LOWER EXTREMITY;  Surgeon: Jerri Kay HERO, MD;  Location: [facility] OR;  Service: Orthopedics;  Laterality: Right;   I & D EXTREMITY Right 06/20/2019   Procedure: DEBRIDEMENT OF STERNOCLAVICULAR JOINT ABSCESS;  Surgeon: Fleeta Hanford Coy, MD;  Location: [facility] OR;  Service: Vascular;  Laterality: Right;   INCISION AND DRAINAGE OF WOUND Right 04/12/2020   Procedure: excision of right sternoclavicular joint infection;  Surgeon: Lowery Estefana RAMAN, DO;  Location: [facility];  Service: Plastics;  Laterality: Right;  45 min total   STERNAL WOUND DEBRIDEMENT Right 09/19/2019   Procedure: EXCISIONAL DEBRIDEMENT AND IRRIGATION OF RIGHT STERNOCLAVICULAR JOINT WOUND;  Surgeon: Fleeta Hanford Coy, MD;  Location: [facility] OR;  Service: Thoracic;  Laterality: Right;   TEE WITHOUT CARDIOVERSION N/A 06/05/2019   Procedure: TRANSESOPHAGEAL ECHOCARDIOGRAM (TEE);  Surgeon: Pietro Rogue  S, MD;  Location: [facility] ENDOSCOPY;  Service: Cardiovascular;  Laterality: N/A;   WOUND EXPLORATION Right 06/23/2019   Procedure: irrigation and clean out right shoulder;  Surgeon: Fleeta Hanford Coy, MD;  Location: [facility] OR;  Service: Thoracic;  Laterality: Right;    Social History:  reports that he has been smoking cigarettes. He has a 15 pack-year smoking history. He has never  used smokeless tobacco. He reports that he does not currently use alcohol. He reports that he does not currently use drugs after having used the following drugs: Marijuana.   Allergies[1]  Family History  Problem Relation Age of Onset   Alcohol abuse Mother    Depression Mother    Drug abuse Mother    Physical abuse Mother    Sexual abuse Mother    ADD / ADHD Paternal Aunt    Alcohol abuse Maternal Grandfather    Bipolar disorder Maternal Grandmother    Schizophrenia Maternal Grandmother    Drug abuse Maternal Grandmother    Alcohol abuse Paternal Grandfather    Depression Paternal Grandfather    Depression Paternal Grandmother    Drug abuse Paternal Grandmother       Prior to Admission medications  Medication Sig Start Date End Date Taking? Authorizing Provider  azelastine  (ASTELIN ) 0.1 % nasal spray Place 2 sprays into both nostrils daily. Use in each nostril as directed 07/05/23   Tobie Eldora NOVAK, MD  cephALEXin  (KEFLEX ) 500 MG capsule Take 1 capsule (500 mg total) by mouth 3 (three) times daily. 12/13/23   McDonald, Juliene SAUNDERS, DPM  Cholecalciferol  (VITAMIN D3) 50 MCG (2000 UT) TABS Take 1 tablet (50 mcg total) by mouth daily. 06/20/22   Pearlean Manus, MD  ciclopirox  (PENLAC ) 8 % solution Apply topically at bedtime. Apply over nail and surrounding skin. Apply daily over previous coat. After seven (7) days, may remove with alcohol and continue cycle. 09/27/23   Gershon Donnice SAUNDERS, DPM  cloNIDine  (CATAPRES ) 0.2 MG tablet Take 1 tablet (0.2 mg total) by mouth 3 (three) times daily. 10/03/22   Vickey Mettle, MD  FLUoxetine  (PROZAC ) 20 MG capsule Take 1 capsule (20 mg total) by mouth daily. 05/31/23   Evelena Figures, MD  fluticasone  (FLONASE ) 50 MCG/ACT nasal spray Place 2 sprays into both nostrils daily. 07/05/23   Tobie Eldora NOVAK, MD  glipiZIDE -metformin  (METAGLIP) 5-500 MG tablet Take 1 tablet by mouth daily.    [provider]  hydrOXYzine  (ATARAX ) 25 MG tablet Take 1 tablet (25  mg total) by mouth 3 (three) times daily as needed for anxiety. 05/30/23   Evelena Figures, MD  lisinopril  (PRINIVIL ,ZESTRIL ) 10 MG tablet Take 10 mg by mouth daily.    [provider]  methotrexate (RHEUMATREX) 2.5 MG tablet Take 6 tablets by mouth once a week. 02/07/22   [provider]  OLANZapine  (ZYPREXA ) 15 MG tablet Take 1 tablet (15 mg total) by mouth at bedtime. 05/30/23   Evelena Figures, MD  omeprazole (PRILOSEC) 40 MG capsule Take 1 capsule by mouth every morning. 05/25/20   [provider]  traZODone  (DESYREL ) 50 MG tablet Take 1 tablet (50 mg total) by mouth at bedtime as needed for sleep. 05/30/23   Evelena Figures, MD    Physical Exam: BP (!) 134/96   Pulse (!) 128   Temp 97.8 F (36.6 C) (Oral)   Resp 20   Ht 5' 11 (1.803 m)   Wt 112 kg   SpO2 100%   BMI 34.44 kg/m   General: 33 y.o.  year-old male well developed well nourished in no acute distress.  Alert and oriented x3. Cardiovascular: Regular rate and rhythm with no rubs or gallops.  No thyromegaly or JVD noted.  No lower extremity edema. 2/4 pulses in all 4 extremities. Respiratory: Clear to auscultation with no wheezes or rales. Good inspiratory effort. Abdomen: Soft nontender nondistended with normal bowel sounds x4 quadrants. Muskuloskeletal: No cyanosis, clubbing or edema noted bilaterally Neuro: CN II-XII intact, strength, sensation, reflexes Skin: No ulcerative lesions noted or rashes Psychiatry: Judgement and insight appear normal. Mood is appropriate for condition and setting          Labs on Admission:  Basic Metabolic Panel: Recent Labs  Lab 02/01/24 0021  NA 126*  K 4.3  CL 85*  CO2 24  GLUCOSE 218*  BUN 27*  CREATININE 1.33*  CALCIUM  10.5*   Liver Function Tests: Recent Labs  Lab 02/01/24 0021  AST 43*  ALT 42  ALKPHOS 125  BILITOT 0.9  PROT 8.5*  ALBUMIN  5.2*   Recent Labs  Lab 02/01/24 0021  LIPASE 19   No results for input(s): AMMONIA in the last 168  hours. CBC: Recent Labs  Lab 02/01/24 0021  WBC 21.0*  HGB 16.1  HCT 45.4  MCV 80.4  PLT 370   Cardiac Enzymes: No results for input(s): CKTOTAL, CKMB, CKMBINDEX, TROPONINI in the last 168 hours.  BNP (last 3 results) No results for input(s): BNP in the last 8760 hours.  ProBNP (last 3 results) No results for input(s): PROBNP in the last 8760 hours.  CBG: No results for input(s): GLUCAP in the last 168 hours.  Radiological Exams on Admission: CT ABDOMEN PELVIS W CONTRAST Result Date: 02/01/2024 EXAM: CT ABDOMEN AND PELVIS WITH CONTRAST 02/01/2024 02:46:16 AM TECHNIQUE: CT of the abdomen and pelvis was performed with the administration of intravenous contrast. Multiplanar reformatted images are provided for review. Automated exposure control, iterative reconstruction, and/or weight-based adjustment of the mA/kV was utilized to reduce the radiation dose to as low as reasonably achievable. COMPARISON: 01/29/2018 CLINICAL HISTORY: Vomiting and fevers, initial encounter FINDINGS: LOWER CHEST: Lung bases are clear. LIVER: Fatty infiltration of the liver is noted. GALLBLADDER AND BILE DUCTS: The gallbladder is unremarkable. No calculi or obstructive changes are seen. SPLEEN: No acute abnormality. PANCREAS: No acute abnormality. ADRENAL GLANDS: No acute abnormality. KIDNEYS, URETERS AND BLADDER: No stones in the kidneys or ureters. No hydronephrosis. The bladder is decompressed. GI AND BOWEL: The stomach and small bowel are unremarkable. The appendix has been surgically removed. No obstructive or inflammatory changes of the colon are seen. There is no bowel obstruction. PERITONEUM AND RETROPERITONEUM: No ascites. No free air. VASCULATURE: Aorta is normal in caliber. LYMPH NODES: No lymphadenopathy. REPRODUCTIVE ORGANS: The prostate is within normal limits. BONES AND SOFT TISSUES: No acute osseous abnormality. No focal soft tissue abnormality. IMPRESSION: 1. No acute findings in the  abdomen or pelvis. Electronically signed by: Oneil Devonshire MD 02/01/2024 02:54 AM EST RP Workstation: [id number]    EKG: I independently viewed the EKG done and my findings are as followed: None available at the time of this visit.  Assessment/Plan Present on Admission:  AKI (acute kidney injury)  Principal Problem:   AKI (acute kidney injury)  AKI, likely prerenal in setting of GI losses from vomiting Creatinine at baseline 0.8 with GFR greater than 60 Presented with creatinine of 1.33 GFR Avoid nephrotoxic agents, dehydration, and hypotension Continue IV fluid hydration NS at 100 cc/h x 1 day. Monitor urine output.  Lactic acidosis, suspect in the setting of severe dehydration from GI losses versus active infective process Rule out other causes In the setting of immunosuppression on methotrexate, will treat for sepsis until proven otherwise Follow-up peripheral blood cultures x 2 Lactic acid 2.9, 3.1 Continue IV fluid Added IV vancomycin , cefepime, and IV Flagyl  Repeat lactic acid level  Leukocytosis with concern for early infection in the state of immunosuppression. SIRS Presented with WBC of 21.0 K, tachycardia heart rate 120, lactic acidosis 3.1. UA negative for pyuria CT abdomen pelvis was nonrevealing. Management as stated above.  Rheumatoid arthritis on methotrexate The patient is immunosuppressed, concern for early infection Hold off home methotrexate.  Osteoarthritis Diffuse joint pain Pain control  Mild hypercalcemia Calcium  10.5 IV fluid hydration NS at 100 cc/h x 1 day.  Isolated elevated AST AST 43 Monitor for now.  Type 2 diabetes with hyperglycemia Serum glucose 218 Last hemoglobin A1c 6.0 in 2021 Update hemoglobin A1c. Insulin  coverage when hyperglycemic.  Hypovolemic hyponatremia from GI losses Serum sodium 126 NS at 100 cc/h x 1 day Repeat BMP, avoid quick correction of hyponatremia.  Bipolar disorder Resume home regimen.  Obesity BMI  34 Recommend weight loss outpatient with regular physical activity and healthy diet.   Critical care time: 55 minutes.   DVT prophylaxis: Subcu Lovenox  daily.  Code Status: Full code.  Family Communication: None at bedside.  Disposition Plan: Admitted to telemetry unit.  Consults called: None.  Admission status: Inpatient status.   Status is: Inpatient status. The patient requires at least 2 midnights for further evaluation and treatment of present condition.    Terry LOISE Hurst MD [facility] Pager [phone]  If 7PM-7AM, please contact night-coverage [email or web address] Password [email or web address]  02/01/2024, 3:47 AM      [1] No Known Allergies  "

## 2024-02-02 ENCOUNTER — Encounter (HOSPITAL_COMMUNITY): Payer: Self-pay | Admitting: Internal Medicine

## 2024-02-02 DIAGNOSIS — E871 Hypo-osmolality and hyponatremia: Secondary | ICD-10-CM | POA: Diagnosis present

## 2024-02-02 DIAGNOSIS — Z720 Tobacco use: Secondary | ICD-10-CM | POA: Diagnosis not present

## 2024-02-02 DIAGNOSIS — K219 Gastro-esophageal reflux disease without esophagitis: Secondary | ICD-10-CM | POA: Diagnosis not present

## 2024-02-02 DIAGNOSIS — F1920 Other psychoactive substance dependence, uncomplicated: Secondary | ICD-10-CM

## 2024-02-02 DIAGNOSIS — M069 Rheumatoid arthritis, unspecified: Secondary | ICD-10-CM | POA: Diagnosis not present

## 2024-02-02 DIAGNOSIS — N179 Acute kidney failure, unspecified: Secondary | ICD-10-CM | POA: Diagnosis not present

## 2024-02-02 LAB — URINE DRUG SCREEN
Amphetamines: NEGATIVE
Barbiturates: NEGATIVE
Benzodiazepines: NEGATIVE
Cocaine: NEGATIVE
Fentanyl: NEGATIVE
Methadone Scn, Ur: NEGATIVE
Opiates: NEGATIVE
Tetrahydrocannabinol: NEGATIVE

## 2024-02-02 LAB — CBC WITH DIFFERENTIAL/PLATELET
Abs Immature Granulocytes: 0.07 K/uL (ref 0.00–0.07)
Basophils Absolute: 0 K/uL (ref 0.0–0.1)
Basophils Relative: 0 %
Eosinophils Absolute: 0 K/uL (ref 0.0–0.5)
Eosinophils Relative: 0 %
HCT: 34.8 % — ABNORMAL LOW (ref 39.0–52.0)
Hemoglobin: 11.8 g/dL — ABNORMAL LOW (ref 13.0–17.0)
Immature Granulocytes: 1 %
Lymphocytes Relative: 10 %
Lymphs Abs: 1.1 K/uL (ref 0.7–4.0)
MCH: 28.6 pg (ref 26.0–34.0)
MCHC: 33.9 g/dL (ref 30.0–36.0)
MCV: 84.3 fL (ref 80.0–100.0)
Monocytes Absolute: 1 K/uL (ref 0.1–1.0)
Monocytes Relative: 9 %
Neutro Abs: 8.7 K/uL — ABNORMAL HIGH (ref 1.7–7.7)
Neutrophils Relative %: 80 %
Platelets: 156 K/uL (ref 150–400)
RBC: 4.13 MIL/uL — ABNORMAL LOW (ref 4.22–5.81)
RDW: 14.7 % (ref 11.5–15.5)
WBC: 10.9 K/uL — ABNORMAL HIGH (ref 4.0–10.5)
nRBC: 0 % (ref 0.0–0.2)

## 2024-02-02 LAB — BASIC METABOLIC PANEL WITH GFR
Anion gap: 12 (ref 5–15)
BUN: 9 mg/dL (ref 6–20)
CO2: 22 mmol/L (ref 22–32)
Calcium: 8.4 mg/dL — ABNORMAL LOW (ref 8.9–10.3)
Chloride: 95 mmol/L — ABNORMAL LOW (ref 98–111)
Creatinine, Ser: 0.71 mg/dL (ref 0.61–1.24)
GFR, Estimated: >60 mL/min
Glucose, Bld: 129 mg/dL — ABNORMAL HIGH (ref 70–99)
Potassium: 4.2 mmol/L (ref 3.5–5.1)
Sodium: 128 mmol/L — ABNORMAL LOW (ref 135–145)

## 2024-02-02 LAB — GLUCOSE, CAPILLARY
Glucose-Capillary: 140 mg/dL — ABNORMAL HIGH (ref 70–99)
Glucose-Capillary: 150 mg/dL — ABNORMAL HIGH (ref 70–99)
Glucose-Capillary: 138 mg/dL — ABNORMAL HIGH (ref 70–99)
Glucose-Capillary: 140 mg/dL — ABNORMAL HIGH (ref 70–99)

## 2024-02-02 LAB — MAGNESIUM: Magnesium: 1.8 mg/dL (ref 1.7–2.4)

## 2024-02-02 MED ORDER — TRAZODONE HCL 50 MG PO TABS: 50.0000 mg | ORAL_TABLET | Freq: Every evening | ORAL | Status: DC | PRN

## 2024-02-02 MED ORDER — DICLOFENAC SODIUM 3 % EX GEL: 1.0000 | Freq: Two times a day (BID) | CUTANEOUS | Status: DC

## 2024-02-02 MED ORDER — OXYCODONE HCL 5 MG PO TABS
5.0000 mg | ORAL_TABLET | Freq: Four times a day (QID) | ORAL | Status: DC | PRN
  Administered 2024-02-02 – 2024-02-03 (×3): 7.5 mg via ORAL
  Filled 2024-02-02 (×3): qty 2

## 2024-02-02 MED ORDER — ACETAMINOPHEN 500 MG PO TABS
1000.0000 mg | ORAL_TABLET | Freq: Four times a day (QID) | ORAL | Status: DC
  Administered 2024-02-02 – 2024-02-03 (×3): 1000 mg via ORAL
  Filled 2024-02-02 (×4): qty 2

## 2024-02-02 MED ORDER — SODIUM CHLORIDE 0.9 % IV SOLN: INTRAVENOUS | Status: DC

## 2024-02-02 MED ORDER — ALLOPURINOL 300 MG PO TABS
300.0000 mg | ORAL_TABLET | Freq: Every day | ORAL | Status: DC
  Administered 2024-02-02 – 2024-02-03 (×2): 300 mg via ORAL
  Filled 2024-02-02 (×2): qty 1

## 2024-02-02 MED ORDER — ATORVASTATIN CALCIUM 10 MG PO TABS
10.0000 mg | ORAL_TABLET | Freq: Every day | ORAL | Status: DC
  Administered 2024-02-02 – 2024-02-03 (×2): 10 mg via ORAL
  Filled 2024-02-02 (×2): qty 1

## 2024-02-02 MED ORDER — NICOTINE 21 MG/24HR TD PT24
21.0000 mg | MEDICATED_PATCH | Freq: Every day | TRANSDERMAL | Status: DC
  Filled 2024-02-02 (×2): qty 1

## 2024-02-02 MED ORDER — RIVAROXABAN 10 MG PO TABS
10.0000 mg | ORAL_TABLET | Freq: Every day | ORAL | Status: DC
  Filled 2024-02-02: qty 1

## 2024-02-02 MED ORDER — CYCLOSPORINE 0.05 % OP EMUL
1.0000 [drp] | Freq: Two times a day (BID) | OPHTHALMIC | Status: DC
  Administered 2024-02-02 – 2024-02-03 (×2): 1 [drp] via OPHTHALMIC
  Filled 2024-02-02 (×3): qty 30

## 2024-02-02 MED ORDER — FLUTICASONE PROPIONATE 50 MCG/ACT NA SUSP
2.0000 | Freq: Every day | NASAL | Status: DC
  Administered 2024-02-02 – 2024-02-03 (×2): 2 via NASAL
  Filled 2024-02-02 (×2): qty 16

## 2024-02-02 MED ORDER — BISOPROLOL FUMARATE 5 MG PO TABS
5.0000 mg | ORAL_TABLET | Freq: Every day | ORAL | Status: DC
  Administered 2024-02-02 – 2024-02-03 (×2): 5 mg via ORAL
  Filled 2024-02-02 (×2): qty 1

## 2024-02-02 MED ORDER — HYDROCHLOROTHIAZIDE 12.5 MG PO TABS
6.2500 mg | ORAL_TABLET | Freq: Every day | ORAL | Status: DC
  Administered 2024-02-02: 6.25 mg via ORAL
  Filled 2024-02-02: qty 1

## 2024-02-02 MED ORDER — PANTOPRAZOLE SODIUM 40 MG PO TBEC
40.0000 mg | DELAYED_RELEASE_TABLET | Freq: Every day | ORAL | Status: DC
  Administered 2024-02-02 – 2024-02-03 (×2): 40 mg via ORAL
  Filled 2024-02-02 (×2): qty 1

## 2024-02-02 MED ORDER — FOLIC ACID 1 MG PO TABS
1.0000 mg | ORAL_TABLET | Freq: Every day | ORAL | Status: DC
  Administered 2024-02-02 – 2024-02-03 (×2): 1 mg via ORAL
  Filled 2024-02-02 (×2): qty 1

## 2024-02-02 MED ORDER — HYDROXYZINE HCL 25 MG PO TABS
25.0000 mg | ORAL_TABLET | Freq: Three times a day (TID) | ORAL | Status: DC | PRN
  Administered 2024-02-02 – 2024-02-03 (×2): 25 mg via ORAL
  Filled 2024-02-02 (×2): qty 1

## 2024-02-02 MED ORDER — BISOPROLOL-HYDROCHLOROTHIAZIDE 5-6.25 MG PO TABS: 1.0000 | ORAL_TABLET | Freq: Every day | ORAL | Status: DC

## 2024-02-02 MED ORDER — KETOROLAC TROMETHAMINE 30 MG/ML IJ SOLN
30.0000 mg | Freq: Three times a day (TID) | INTRAMUSCULAR | Status: DC | PRN
  Administered 2024-02-02 – 2024-02-03 (×3): 30 mg via INTRAVENOUS
  Filled 2024-02-02 (×3): qty 1

## 2024-02-02 MED ORDER — FLUOXETINE HCL 20 MG PO CAPS
20.0000 mg | ORAL_CAPSULE | Freq: Every day | ORAL | Status: DC
  Administered 2024-02-02 – 2024-02-03 (×2): 20 mg via ORAL
  Filled 2024-02-02 (×2): qty 1

## 2024-02-02 NOTE — Plan of Care (Signed)

## 2024-02-02 NOTE — Hospital Course (Signed)
 33 y.o. male with medical history significant for rheumatoid arthritis on methotrexate, chronic gout, history of osteomyelitis, prior appendectomy, bipolar disorder, hypertension, type 2 diabetes, osteoarthritis, who presented to the ER due to subjective fevers at home, shaking chills, nausea, vomiting, and diarrhea for the past 2 days.  Associated with some discomfort in his upper abdomen.  Unable to keep anything down.  Denies exposure to any sick contacts.   In the ER, tachycardic with heart rate in the 140s and improved with IV fluid boluses NS 1 L x 2, and IV antiemetics.  Lab work was notable for leukocytosis 21,000, lactic acid 2.9, 3.1.  Labs were also remarkable for serum sodium 126 chloride 85, serum glucose 218, BUN 27, creatinine 1.33, calcium  10.5, anion gap 18, AST 43.   CT abdomen and pelvis with contrast revealed no acute findings in the abdomen or pelvis.   Due to concern for AKI and possible active infective process, gastroenteritis, EDP requesting admission for further management.  Admitted by [facility], hospitalist service.

## 2024-02-02 NOTE — Progress Notes (Addendum)
 " PROGRESS NOTE   Herbert Walsh  FMW:[id number] DOB: 1990-05-12 DOA: 02/01/2024 PCP: [facility]   Chief Complaint  Patient presents with   Fever   Level of care: Med-Surg  Brief Admission History:  33 y.o. male with medical history significant for rheumatoid arthritis on methotrexate, chronic gout, history of osteomyelitis, prior appendectomy, bipolar disorder, hypertension, type 2 diabetes, osteoarthritis, who presented to the ER due to subjective fevers at home, shaking chills, nausea, vomiting, and diarrhea for the past 2 days.  Associated with some discomfort in his upper abdomen.  Unable to keep anything down.  Denies exposure to any sick contacts.   In the ER, tachycardic with heart rate in the 140s and improved with IV fluid boluses NS 1 L x 2, and IV antiemetics.  Lab work was notable for leukocytosis 21,000, lactic acid 2.9, 3.1.  Labs were also remarkable for serum sodium 126 chloride 85, serum glucose 218, BUN 27, creatinine 1.33, calcium  10.5, anion gap 18, AST 43.   CT abdomen and pelvis with contrast revealed no acute findings in the abdomen or pelvis.   Due to concern for AKI and possible active infective process, gastroenteritis, EDP requesting admission for further management.  Admitted by [facility], hospitalist service.   Assessment and Plan:  Hyponatremia -- suspect from GI losses  -- continue IV hydration  -- following BMP  AKI, likely prerenal in setting of GI losses from vomiting Creatinine at baseline 0.8 with GFR greater than 60 Presented with creatinine of 1.33 GFR Avoid nephrotoxic agents, dehydration, and hypotension Continue IV fluid hydration Monitor urine output.   Lactic acidosis, suspect in the setting of severe dehydration from GI losses versus active infective process Rule out other causes In the setting of immunosuppression on methotrexate, will treat for sepsis until proven otherwise Follow-up peripheral blood cultures x 2 Lactic  acidosis treated with fluids Continue IV fluid   Leukocytosis -- improving  SIRS - resolved  Presented with WBC of 21.0 K, tachycardia heart rate 120, lactic acidosis 3.1. UA negative for pyuria CT abdomen pelvis was nonrevealing. Management as stated above.   Rheumatoid arthritis on methotrexate The patient is immunosuppressed, concern for early infection Hold off home methotrexate.   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  CBG (last 3)  Recent Labs    02/01/24 2116 02/02/24 0716 02/02/24 1109  GLUCAP 172* 140* 150*   Bipolar disorder Resume home regimen.   Obesity BMI 34 Recommend weight loss outpatient with regular physical activity and healthy diet.   DVT prophylaxis: refused enoxaparin , rivaroxaban  10 mg ordered Code Status: full Family Communication:  Disposition: anticipate home    Consultants:   Procedures:   Antimicrobials:    Subjective: Pt reports he hurts all over and he wants me to order IV dilaudid  every 4 hours along with oral oxycodone .  He is sitting up eating breakfast.  Objective: Vitals:   02/01/24 2117 02/01/24  2357 02/02/24 0450 02/02/24 1315  BP: (!) 136/96 135/69 (!) 147/89 108/68  Pulse: (!) 128 (!) 112 (!) 122 89  Resp:  16  18  Temp: 99.1 F (37.3 C) 98.4 F (36.9 C) 98.4 F (36.9 C)   TempSrc: Oral Oral Oral   SpO2: 98% 98% 97% 98%   Weight:      Height:        Intake/Output Summary (Last 24 hours) at 02/02/2024 1434 Last data filed at 02/02/2024 1300 Gross per 24 hour  Intake 2900.42 ml  Output --  Net 2900.42 ml   Filed Weights   01/31/24 2357 01/31/24 2359  Weight: 117.9 kg 112 kg   Examination:  General exam: Appears calm and comfortable  Respiratory system: Clear to auscultation. Respiratory effort normal. Cardiovascular system: normal S1 & S2 heard. No JVD, murmurs, rubs, gallops or clicks. No pedal edema. Gastrointestinal system: Abdomen is nondistended, soft and nontender. No organomegaly or masses felt. Normal bowel sounds heard. Central nervous system: Alert and oriented. No focal neurological deficits. Extremities: Symmetric 5 x 5 power. Skin: No rashes, lesions or ulcers. Psychiatry: Judgement and insight appear normal. Mood & affect appropriate.   Data Reviewed: I have personally reviewed following labs and imaging studies  CBC: Recent Labs  Lab 02/01/24 0021 02/01/24 0418 02/02/24 0808  WBC 21.0* 14.2* 10.9*  NEUTROABS  --  11.0* 8.7*  HGB 16.1 13.7 11.8*  HCT 45.4 38.5* 34.8*  MCV 80.4 80.7 84.3  PLT 370 266 156    Basic Metabolic Panel: Recent Labs  Lab 02/01/24 0021 02/01/24 0418 02/02/24 0808  NA 126* 127* 128*  K 4.3 4.3 4.2  CL 85* 90* 95*  CO2 24 19* 22  GLUCOSE 218* 168* 129*  BUN 27* 27* 9  CREATININE 1.33* 1.12 0.71  CALCIUM  10.5* 9.1 8.4*  MG  --  1.8 1.8  PHOS  --  2.4*  --     CBG: Recent Labs  Lab 02/01/24 1226 02/01/24 1616 02/01/24 2116 02/02/24 0716 02/02/24 1109  GLUCAP 148* 153* 172* 140* 150*    Recent Results (from the past 240 hours)  Resp panel by RT-PCR (RSV, Flu A&B, Covid) Anterior Nasal Swab     Status: None   Collection Time: 02/01/24 12:02 AM   Specimen: Anterior Nasal Swab  Result Value Ref Range Status   SARS Coronavirus 2 by RT PCR NEGATIVE NEGATIVE Final    Comment: (NOTE) SARS-CoV-2 target nucleic acids are NOT  DETECTED.  The SARS-CoV-2 RNA is generally detectable in upper respiratory specimens during the acute phase of infection. The lowest concentration of SARS-CoV-2 viral copies this assay can detect is 138 copies/mL. A negative result does not preclude SARS-Cov-2 infection and should not be used as the sole basis for treatment or other patient management decisions. A negative result may occur with  improper specimen collection/handling, submission of specimen other than nasopharyngeal swab, presence of viral mutation(s) within the areas targeted by this assay, and inadequate number of viral copies(<138 copies/mL). A negative result must be combined with clinical observations, patient history, and epidemiological information. The expected result is Negative.  Fact Sheet for Patients:  bloggercourse.com  Fact Sheet for Healthcare Providers:  seriousbroker.it  This test is no t yet approved or cleared by the [place]  FDA and  has been authorized for detection and/or diagnosis of SARS-CoV-2 by FDA under an Emergency Use Authorization (EUA). This EUA will remain  in effect (meaning this test can be used) for the duration  of the COVID-19 declaration under Section 564(b)(1) of the Act, 21 U.S.C.section 360bbb-3(b)(1), unless the authorization is terminated  or revoked sooner.       Influenza A by PCR NEGATIVE NEGATIVE Final   Influenza B by PCR NEGATIVE NEGATIVE Final    Comment: (NOTE) The Xpert Xpress SARS-CoV-2/FLU/RSV plus assay is intended as an aid in the diagnosis of influenza from Nasopharyngeal swab specimens and should not be used as a sole basis for treatment. Nasal washings and aspirates are unacceptable for Xpert Xpress SARS-CoV-2/FLU/RSV testing.  Fact Sheet for Patients: bloggercourse.com  Fact Sheet for Healthcare Providers: seriousbroker.it  This test is not yet  approved or cleared by the [place]  FDA and has been authorized for detection and/or diagnosis of SARS-CoV-2 by FDA under an Emergency Use Authorization (EUA). This EUA will remain in effect (meaning this test can be used) for the duration of the COVID-19 declaration under Section 564(b)(1) of the Act, 21 U.S.C. section 360bbb-3(b)(1), unless the authorization is terminated or revoked.     Resp Syncytial Virus by PCR NEGATIVE NEGATIVE Final    Comment: (NOTE) Fact Sheet for Patients: bloggercourse.com  Fact Sheet for Healthcare Providers: seriousbroker.it  This test is not yet approved or cleared by the [place]  FDA and has been authorized for detection and/or diagnosis of SARS-CoV-2 by FDA under an Emergency Use Authorization (EUA). This EUA will remain in effect (meaning this test can be used) for the duration of the COVID-19 declaration under Section 564(b)(1) of the Act, 21 U.S.C. section 360bbb-3(b)(1), unless the authorization is terminated or revoked.  Performed at [facility], [address], [place], [place] [address]   Blood culture (routine x 2)     Status: None (Preliminary result)   Collection Time: 02/01/24  1:49 AM   Specimen: BLOOD  Result Value Ref Range Status   Specimen Description BLOOD LEFT ANTECUBITAL  Final   Special Requests   Final    BOTTLES DRAWN AEROBIC AND ANAEROBIC Blood Culture adequate volume   Culture   Final    NO GROWTH 1 DAY Performed at [facility], [address], [place], [place] [address]    Report Status PENDING  Incomplete  Blood culture (routine x 2)     Status: None (Preliminary result)   Collection Time: 02/01/24  2:37 AM   Specimen: BLOOD RIGHT FOREARM  Result Value Ref Range Status   Specimen Description BLOOD RIGHT FOREARM  Final   Special Requests   Final    BOTTLES DRAWN AEROBIC AND ANAEROBIC Blood Culture adequate volume   Culture   Final    NO GROWTH 1  DAY Performed at [facility], [address], [place], [place] [address]    Report Status PENDING  Incomplete     Radiology Studies: CT ABDOMEN PELVIS W CONTRAST Result Date: 02/01/2024 EXAM: CT ABDOMEN AND PELVIS WITH CONTRAST 02/01/2024 02:46:16 AM TECHNIQUE: CT of the abdomen and pelvis was performed with the administration of intravenous contrast. Multiplanar reformatted images are provided for review. Automated exposure control, iterative reconstruction, and/or weight-based adjustment of the mA/kV was utilized to reduce the radiation dose to as low as reasonably achievable. COMPARISON: 01/29/2018 CLINICAL HISTORY: Vomiting and fevers, initial encounter FINDINGS: LOWER CHEST: Lung bases are clear. LIVER: Fatty infiltration of the liver is noted. GALLBLADDER AND BILE DUCTS: The gallbladder is unremarkable. No calculi or obstructive changes are seen. SPLEEN: No acute abnormality. PANCREAS: No acute abnormality. ADRENAL GLANDS: No acute abnormality. KIDNEYS, URETERS AND BLADDER: No stones in the  kidneys or ureters. No hydronephrosis. The bladder is decompressed. GI AND BOWEL: The stomach and small bowel are unremarkable. The appendix has been surgically removed. No obstructive or inflammatory changes of the colon are seen. There is no bowel obstruction. PERITONEUM AND RETROPERITONEUM: No ascites. No free air. VASCULATURE: Aorta is normal in caliber. LYMPH NODES: No lymphadenopathy. REPRODUCTIVE ORGANS: The prostate is within normal limits. BONES AND SOFT TISSUES: No acute osseous abnormality. No focal soft tissue abnormality. IMPRESSION: 1. No acute findings in the abdomen or pelvis. Electronically signed by: Oneil Devonshire MD 02/01/2024 02:54 AM EST RP Workstation: [id number]   Scheduled Meds:  acetaminophen   1,000 mg Oral Q6H   allopurinol   300 mg Oral Daily   atorvastatin   10 mg Oral Daily   bisoprolol   5 mg Oral Daily   And   hydrochlorothiazide   6.25 mg Oral Daily   cycloSPORINE   1 drop Both  Eyes BID   Diclofenac  Sodium  1 Application Topical BID   FLUoxetine   20 mg Oral Daily   fluticasone   2 spray Each Nare Daily   folic acid   1 mg Oral Daily   insulin  aspart  0-5 Units Subcutaneous QHS   insulin  aspart  0-9 Units Subcutaneous TID WC   pantoprazole   40 mg Oral Daily   rivaroxaban   10 mg Oral Q supper   Continuous Infusions:  sodium chloride  125 mL/hr at 02/02/24 1017   ceFEPime  (MAXIPIME ) IV 2 g (02/02/24 0530)   metronidazole  500 mg (02/02/24 0445)   vancomycin  1,500 mg (02/02/24 0624)    LOS: 1 day   Time spent: 55 mins  Jataya Wann Vicci, MD How to contact the [facility] Attending or Consulting provider 7A - 7P or covering provider during after hours 7P -7A, for this patient?  Check the care team in [facility] and look for a) attending/consulting [facility] provider listed and b) the [facility] team listed Log into [email or web address] to find provider on call.  Locate the [facility] provider you are looking for under [facility] and page to a number that you can be directly reached. If you still have difficulty reaching the provider, please page the [facility] (Director on Call) for the Hospitalists listed on amion for assistance.  02/02/2024, 2:34 PM    "

## 2024-02-02 NOTE — Progress Notes (Signed)
 Pt not demonstrating any nausea, vomiting or diarrhea throughout the shift. Continuing to monitor.

## 2024-02-02 NOTE — Plan of Care (Signed)
" °  Problem: Fluid Volume: Goal: Ability to maintain a balanced intake and output will improve Outcome: Progressing   Problem: Health Behavior/Discharge Planning: Goal: Ability to identify and utilize available resources and services will improve Outcome: Progressing   Problem: Safety: Goal: Ability to remain free from injury will improve Outcome: Progressing   Problem: Skin Integrity: Goal: Risk for impaired skin integrity will decrease Outcome: Progressing   "

## 2024-02-03 DIAGNOSIS — Z720 Tobacco use: Secondary | ICD-10-CM | POA: Diagnosis not present

## 2024-02-03 DIAGNOSIS — E871 Hypo-osmolality and hyponatremia: Secondary | ICD-10-CM | POA: Diagnosis not present

## 2024-02-03 DIAGNOSIS — K219 Gastro-esophageal reflux disease without esophagitis: Secondary | ICD-10-CM

## 2024-02-03 DIAGNOSIS — M069 Rheumatoid arthritis, unspecified: Secondary | ICD-10-CM | POA: Diagnosis not present

## 2024-02-03 DIAGNOSIS — N179 Acute kidney failure, unspecified: Secondary | ICD-10-CM | POA: Diagnosis not present

## 2024-02-03 LAB — CBC
HCT: 30.3 % — ABNORMAL LOW (ref 39.0–52.0)
Hemoglobin: 10.4 g/dL — ABNORMAL LOW (ref 13.0–17.0)
MCH: 29.2 pg (ref 26.0–34.0)
MCHC: 34.3 g/dL (ref 30.0–36.0)
MCV: 85.1 fL (ref 80.0–100.0)
Platelets: 124 K/uL — ABNORMAL LOW (ref 150–400)
RBC: 3.56 MIL/uL — ABNORMAL LOW (ref 4.22–5.81)
RDW: 14.9 % (ref 11.5–15.5)
WBC: 5.8 K/uL (ref 4.0–10.5)
nRBC: 0 % (ref 0.0–0.2)

## 2024-02-03 LAB — BASIC METABOLIC PANEL WITH GFR
Anion gap: 9 (ref 5–15)
BUN: 12 mg/dL (ref 6–20)
CO2: 23 mmol/L (ref 22–32)
Calcium: 8.2 mg/dL — ABNORMAL LOW (ref 8.9–10.3)
Chloride: 101 mmol/L (ref 98–111)
Creatinine, Ser: 0.75 mg/dL (ref 0.61–1.24)
GFR, Estimated: >60 mL/min
Glucose, Bld: 107 mg/dL — ABNORMAL HIGH (ref 70–99)
Potassium: 4 mmol/L (ref 3.5–5.1)
Sodium: 133 mmol/L — ABNORMAL LOW (ref 135–145)

## 2024-02-03 LAB — GLUCOSE, CAPILLARY: Glucose-Capillary: 119 mg/dL — ABNORMAL HIGH (ref 70–99)

## 2024-02-03 MED ORDER — SODIUM CHLORIDE 0.9 % IV SOLN: INTRAVENOUS | Status: DC

## 2024-02-03 MED ORDER — BISOPROLOL FUMARATE 5 MG PO TABS: 5.0000 mg | ORAL_TABLET | Freq: Every day | ORAL | 1 refills | Status: AC

## 2024-02-03 NOTE — Discharge Instructions (Signed)
IMPORTANT INFORMATION: PAY CLOSE ATTENTION   PHYSICIAN DISCHARGE INSTRUCTIONS  Follow with Primary care provider  [facility]  and other consultants as instructed by your Hospitalist Physician  SEEK MEDICAL CARE OR RETURN TO EMERGENCY ROOM IF SYMPTOMS COME BACK, WORSEN OR NEW PROBLEM DEVELOPS   Please note: You were cared for by a hospitalist during your hospital stay. Every effort will be made to forward records to your primary care provider.  You can request that your primary care provider send for your hospital records if they have not received them.  Once you are discharged, your primary care physician will handle any further medical issues. Please note that NO REFILLS for any discharge medications will be authorized once you are discharged, as it is imperative that you return to your primary care physician (or establish a relationship with a primary care physician if you do not have one) for your post hospital discharge needs so that they can reassess your need for medications and monitor your lab values.  Please get a complete blood count and chemistry panel checked by your Primary MD at your next visit, and again as instructed by your Primary MD.  Get Medicines reviewed and adjusted: Please take all your medications with you for your next visit with your Primary MD  Laboratory/radiological data: Please request your Primary MD to go over all hospital tests and procedure/radiological results at the follow up, please ask your primary care provider to get all Hospital records sent to his/her office.  In some cases, they will be blood work, cultures and biopsy results pending at the time of your discharge. Please request that your primary care provider follow up on these results.  If you are diabetic, please bring your blood sugar readings with you to your follow up appointment with primary care.    Please call and make your follow up appointments as soon as possible.    Also  Note the following: If you experience worsening of your admission symptoms, develop shortness of breath, life threatening emergency, suicidal or homicidal thoughts you must seek medical attention immediately by calling 911 or calling your MD immediately  if symptoms less severe.  You must read complete instructions/literature along with all the possible adverse reactions/side effects for all the Medicines you take and that have been prescribed to you. Take any new Medicines after you have completely understood and accpet all the possible adverse reactions/side effects.   Do not drive when taking Pain medications or sleeping medications (Benzodiazepines)  Do not take more than prescribed Pain, Sleep and Anxiety Medications. It is not advisable to combine anxiety,sleep and pain medications without talking with your primary care practitioner  Special Instructions: If you have smoked or chewed Tobacco  in the last 2 yrs please stop smoking, stop any regular Alcohol  and or any Recreational drug use.  Wear Seat belts while driving.  Do not drive if taking any narcotic, mind altering or controlled substances or recreational drugs or alcohol.       

## 2024-02-03 NOTE — Discharge Summary (Signed)
 Physician Discharge Summary  Herbert Walsh FMW:[id number] DOB: 03/11/1990 DOA: 02/01/2024  PCP: [facility]  Admit date: 02/01/2024 Discharge date: 02/03/2024  Admitted From:  Home  Disposition:  Home   Recommendations for Outpatient Follow-up:  Follow up with PCP in 1 weeks Please obtain BMP/CBC in 1 week  Discharge Condition: STABLE   CODE STATUS: FULL DIET:  resume prior home diet    Brief Hospitalization Summary: Please see all hospital notes, images, labs for full details of the hospitalization. Admission provider HPI:  33 y.o. male with medical history significant for rheumatoid arthritis on methotrexate, chronic gout, history of osteomyelitis, prior appendectomy, bipolar disorder, hypertension, type 2 diabetes, osteoarthritis, who presented to the ER due to subjective fevers at home, shaking chills, nausea, vomiting, and diarrhea for the past 2 days.  Associated with some discomfort in his upper abdomen.  Unable to keep anything down.  Denies exposure to any sick contacts.   In the ER, tachycardic with heart rate in the 140s and improved with IV fluid boluses NS 1 L x 2, and IV antiemetics.  Lab work was notable for leukocytosis 21,000, lactic acid 2.9, 3.1.  Labs were also remarkable for serum sodium 126 chloride 85, serum glucose 218, BUN 27, creatinine 1.33, calcium  10.5, anion gap 18, AST 43.   CT abdomen and pelvis with contrast revealed no acute findings in the abdomen or pelvis.   Due to concern for AKI and possible active infective process, gastroenteritis, EDP requesting admission for further management.  Admitted by [facility], hospitalist service.  Hospital Course by listed problems addressed  Hyponatremia -- resolved -- suspect from GI losses  -- treated with IV hydration    AKI, likely prerenal in setting of GI losses from vomiting Creatinine at baseline 0.8 with GFR greater than 60 Presented with creatinine of 1.33 GFR Avoid nephrotoxic agents,  dehydration, and hypotension Treated with IV fluid hydration Monitor urine output.   Lactic acidosis, suspect in the setting of severe dehydration from GI losses versus active infective process Rule out other causes In the setting of immunosuppression on methotrexate, will treat for sepsis until proven otherwise Follow-up peripheral blood cultures x 2 Lactic acidosis treated with fluids Treated with IV fluid   Leukocytosis -- improving  SIRS - resolved  Presented with WBC of 21.0 K, tachycardia heart rate 120, lactic acidosis 3.1. UA negative for pyuria CT abdomen pelvis was nonrevealing. Management as stated above.   Rheumatoid arthritis on methotrexate The patient is immunosuppressed He is on methotrexate.   Osteoarthritis Diffuse joint pain Pain control   Chronic pain with opioid seeking behavior Pt requesting IV dilaudid  every 4 hours and oxycodone   -I explained to him that we would not be doing that  -see updated pain management orders -ketorolac  IV for severe pain -UDS requested and negative for recreational substances - pt has history of substance induced mood disorder and severe dextromethorphan use disorder   Mild hypercalcemia Resolved with IV fluid hydration    Isolated elevated AST--RESOLVED  RESOLVED    Type 2 diabetes with hyperglycemia Serum glucose 218 Last hemoglobin A1c 6.0 in 2021 Update hemoglobin A1c 5.9%  Insulin  coverage when hyperglycemic in hospital only Resume home treatment at discharge CBG (last 3)  Recent Labs    02/02/24 1610 02/02/24 2048 02/03/24 0740  GLUCAP 138* 140* 119*   Bipolar disorder Resume home regimen.   Obesity BMI 34 Recommend weight loss outpatient with regular physical activity and healthy diet.  Discharge Diagnoses:  Principal  Problem:   AKI (acute kidney injury) Active Problems:   Intermittent explosive   Difficulty controlling anger   Rheumatoid arthritis (HCC)   Adult-onset obesity    Gastroesophageal reflux disease   Tobacco use   NSAID long-term use   Dextromethorphan use disorder, severe, dependence (HCC)   Substance induced mood disorder (HCC)   SIRS (systemic inflammatory response syndrome) (HCC)   Hyponatremia   Discharge Instructions:  Allergies as of 02/03/2024   No Known Allergies      Medication List     STOP taking these medications    bisoprolol -hydrochlorothiazide  5-6.25 MG tablet Commonly known as: ZIAC    lisinopril  20 MG tablet Commonly known as: ZESTRIL    metFORMIN  500 MG tablet Commonly known as: GLUCOPHAGE        TAKE these medications    allopurinol  300 MG tablet Commonly known as: ZYLOPRIM  Take 300 mg by mouth daily.   atorvastatin  10 MG tablet Commonly known as: LIPITOR Take 10 mg by mouth daily.   azelastine  0.1 % nasal spray Commonly known as: ASTELIN  Place 2 sprays into both nostrils daily. Use in each nostril as directed   bisoprolol  5 MG tablet Commonly known as: ZEBETA  Take 1 tablet (5 mg total) by mouth daily. Start taking on: February 04, 2024   chlorhexidine  0.12 % solution Commonly known as: PERIDEX  2 (two) times daily.   Diclofenac  Sodium 3 % Gel Apply 1 Application topically 2 (two) times daily.   FLUoxetine  20 MG capsule Commonly known as: PROZAC  Take 1 capsule (20 mg total) by mouth daily.   fluticasone  50 MCG/ACT nasal spray Commonly known as: FLONASE  Place 2 sprays into both nostrils daily.   folic acid  1 MG tablet Commonly known as: FOLVITE  Take 1 mg by mouth daily.   glipiZIDE -metformin  5-500 MG tablet Commonly known as: METAGLIP Take 1 tablet by mouth daily.   Humira (2 Pen) 40 MG/0.4ML pen Generic drug: adalimumab Inject into the skin.   hydrOXYzine  25 MG tablet Commonly known as: ATARAX  Take 1 tablet (25 mg total) by mouth 3 (three) times daily as needed for anxiety.   methotrexate 2.5 MG tablet Commonly known as: RHEUMATREX Take 6 tablets by mouth once a week.    naproxen  500 MG EC tablet Commonly known as: EC NAPROSYN  Take 500 mg by mouth 2 (two) times daily with a meal.   omeprazole 40 MG capsule Commonly known as: PRILOSEC Take 1 capsule by mouth every morning.   Ozempic (2 MG/DOSE) 8 MG/3ML Sopn Generic drug: Semaglutide (2 MG/DOSE) Inject 2 mg into the skin once a week.   Restasis  0.05 % ophthalmic emulsion Generic drug: cycloSPORINE  Place 1 drop into both eyes 2 (two) times daily.   traZODone  50 MG tablet Commonly known as: DESYREL  Take 1 tablet (50 mg total) by mouth at bedtime as needed for sleep.   Tyrvaya 0.03 MG/ACT Soln Generic drug: Varenicline Tartrate Place 1 spray into both nostrils 2 (two) times daily.   VITAMIN D2 PO Take 1 capsule by mouth daily.        Follow-up Information     [facility] Schedule an appointment as soon as possible for a visit in 1 week(s).   Why: Hospital Follow Up Contact information: [address] [place] [address] [phone]                Allergies[1] Allergies as of 02/03/2024   No Known Allergies      Medication List     STOP taking these medications  bisoprolol -hydrochlorothiazide  5-6.25 MG tablet Commonly known as: ZIAC    lisinopril  20 MG tablet Commonly known as: ZESTRIL    metFORMIN  500 MG tablet Commonly known as: GLUCOPHAGE        TAKE these medications    allopurinol  300 MG tablet Commonly known as: ZYLOPRIM  Take 300 mg by mouth daily.   atorvastatin  10 MG tablet Commonly known as: LIPITOR Take 10 mg by mouth daily.   azelastine  0.1 % nasal spray Commonly known as: ASTELIN  Place 2 sprays into both nostrils daily. Use in each nostril as directed   bisoprolol  5 MG tablet Commonly known as: ZEBETA  Take 1 tablet (5 mg total) by mouth daily. Start taking on: February 04, 2024   chlorhexidine  0.12 % solution Commonly known as: PERIDEX  2 (two) times daily.   Diclofenac  Sodium 3 % Gel Apply 1 Application  topically 2 (two) times daily.   FLUoxetine  20 MG capsule Commonly known as: PROZAC  Take 1 capsule (20 mg total) by mouth daily.   fluticasone  50 MCG/ACT nasal spray Commonly known as: FLONASE  Place 2 sprays into both nostrils daily.   folic acid  1 MG tablet Commonly known as: FOLVITE  Take 1 mg by mouth daily.   glipiZIDE -metformin  5-500 MG tablet Commonly known as: METAGLIP Take 1 tablet by mouth daily.   Humira (2 Pen) 40 MG/0.4ML pen Generic drug: adalimumab Inject into the skin.   hydrOXYzine  25 MG tablet Commonly known as: ATARAX  Take 1 tablet (25 mg total) by mouth 3 (three) times daily as needed for anxiety.   methotrexate 2.5 MG tablet Commonly known as: RHEUMATREX Take 6 tablets by mouth once a week.   naproxen  500 MG EC tablet Commonly known as: EC NAPROSYN  Take 500 mg by mouth 2 (two) times daily with a meal.   omeprazole 40 MG capsule Commonly known as: PRILOSEC Take 1 capsule by mouth every morning.   Ozempic (2 MG/DOSE) 8 MG/3ML Sopn Generic drug: Semaglutide (2 MG/DOSE) Inject 2 mg into the skin once a week.   Restasis  0.05 % ophthalmic emulsion Generic drug: cycloSPORINE  Place 1 drop into both eyes 2 (two) times daily.   traZODone  50 MG tablet Commonly known as: DESYREL  Take 1 tablet (50 mg total) by mouth at bedtime as needed for sleep.   Tyrvaya 0.03 MG/ACT Soln Generic drug: Varenicline Tartrate Place 1 spray into both nostrils 2 (two) times daily.   VITAMIN D2 PO Take 1 capsule by mouth daily.        Procedures/Studies: CT ABDOMEN PELVIS W CONTRAST Result Date: 02/01/2024 EXAM: CT ABDOMEN AND PELVIS WITH CONTRAST 02/01/2024 02:46:16 AM TECHNIQUE: CT of the abdomen and pelvis was performed with the administration of intravenous contrast. Multiplanar reformatted images are provided for review. Automated exposure control, iterative reconstruction, and/or weight-based adjustment of the mA/kV was utilized to reduce the radiation dose to  as low as reasonably achievable. COMPARISON: 01/29/2018 CLINICAL HISTORY: Vomiting and fevers, initial encounter FINDINGS: LOWER CHEST: Lung bases are clear. LIVER: Fatty infiltration of the liver is noted. GALLBLADDER AND BILE DUCTS: The gallbladder is unremarkable. No calculi or obstructive changes are seen. SPLEEN: No acute abnormality. PANCREAS: No acute abnormality. ADRENAL GLANDS: No acute abnormality. KIDNEYS, URETERS AND BLADDER: No stones in the kidneys or ureters. No hydronephrosis. The bladder is decompressed. GI AND BOWEL: The stomach and small bowel are unremarkable. The appendix has been surgically removed. No obstructive or inflammatory changes of the colon are seen. There is no bowel obstruction. PERITONEUM AND RETROPERITONEUM: No ascites. No free air. VASCULATURE: Aorta is  normal in caliber. LYMPH NODES: No lymphadenopathy. REPRODUCTIVE ORGANS: The prostate is within normal limits. BONES AND SOFT TISSUES: No acute osseous abnormality. No focal soft tissue abnormality. IMPRESSION: 1. No acute findings in the abdomen or pelvis. Electronically signed by: Oneil Devonshire MD 02/01/2024 02:54 AM EST RP Workstation: [id number]     Subjective:   Discharge Exam: Vitals:   02/02/24 2049 02/03/24 0500  BP: 109/66 100/80  Pulse: 87 99  Resp: 20 18  Temp: 98.7 F (37.1 C) 98.5 F (36.9 C)  SpO2: 95% 99%   Vitals:   02/02/24 0450 02/02/24 1315 02/02/24 2049 02/03/24 0500  BP: (!) 147/89 108/68 109/66 100/80  Pulse: (!) 122 89 87 99  Resp:  18 20 18   Temp: 98.4 F (36.9 C)  98.7 F (37.1 C) 98.5 F (36.9 C)  TempSrc: Oral  Oral Oral  SpO2: 97% 98% 95% 99%  Weight:      Height:       General: Pt is alert, awake, not in acute distress Cardiovascular: RRR, S1/S2 +, no rubs, no gallops Respiratory: CTA bilaterally, no wheezing, no rhonchi Abdominal: Soft, NT, ND, bowel sounds + Extremities: no edema, no cyanosis   The results of significant diagnostics from this hospitalization  (including imaging, microbiology, ancillary and laboratory) are listed below for reference.     Microbiology: Recent Results (from the past 240 hours)  Resp panel by RT-PCR (RSV, Flu A&B, Covid) Anterior Nasal Swab     Status: None   Collection Time: 02/01/24 12:02 AM   Specimen: Anterior Nasal Swab  Result Value Ref Range Status   SARS Coronavirus 2 by RT PCR NEGATIVE NEGATIVE Final    Comment: (NOTE) SARS-CoV-2 target nucleic acids are NOT DETECTED.  The SARS-CoV-2 RNA is generally detectable in upper respiratory specimens during the acute phase of infection. The lowest concentration of SARS-CoV-2 viral copies this assay can detect is 138 copies/mL. A negative result does not preclude SARS-Cov-2 infection and should not be used as the sole basis for treatment or other patient management decisions. A negative result may occur with  improper specimen collection/handling, submission of specimen other than nasopharyngeal swab, presence of viral mutation(s) within the areas targeted by this assay, and inadequate number of viral copies(<138 copies/mL). A negative result must be combined with clinical observations, patient history, and epidemiological information. The expected result is Negative.  Fact Sheet for Patients:  bloggercourse.com  Fact Sheet for Healthcare Providers:  seriousbroker.it  This test is no t yet approved or cleared by the [place]  FDA and  has been authorized for detection and/or diagnosis of SARS-CoV-2 by FDA under an Emergency Use Authorization (EUA). This EUA will remain  in effect (meaning this test can be used) for the duration of the COVID-19 declaration under Section 564(b)(1) of the Act, 21 U.S.C.section 360bbb-3(b)(1), unless the authorization is terminated  or revoked sooner.       Influenza A by PCR NEGATIVE NEGATIVE Final   Influenza B by PCR NEGATIVE NEGATIVE Final    Comment: (NOTE) The  Xpert Xpress SARS-CoV-2/FLU/RSV plus assay is intended as an aid in the diagnosis of influenza from Nasopharyngeal swab specimens and should not be used as a sole basis for treatment. Nasal washings and aspirates are unacceptable for Xpert Xpress SARS-CoV-2/FLU/RSV testing.  Fact Sheet for Patients: bloggercourse.com  Fact Sheet for Healthcare Providers: seriousbroker.it  This test is not yet approved or cleared by the [place]  FDA and has been authorized for detection and/or diagnosis of SARS-CoV-2  by FDA under an Emergency Use Authorization (EUA). This EUA will remain in effect (meaning this test can be used) for the duration of the COVID-19 declaration under Section 564(b)(1) of the Act, 21 U.S.C. section 360bbb-3(b)(1), unless the authorization is terminated or revoked.     Resp Syncytial Virus by PCR NEGATIVE NEGATIVE Final    Comment: (NOTE) Fact Sheet for Patients: bloggercourse.com  Fact Sheet for Healthcare Providers: seriousbroker.it  This test is not yet approved or cleared by the [place]  FDA and has been authorized for detection and/or diagnosis of SARS-CoV-2 by FDA under an Emergency Use Authorization (EUA). This EUA will remain in effect (meaning this test can be used) for the duration of the COVID-19 declaration under Section 564(b)(1) of the Act, 21 U.S.C. section 360bbb-3(b)(1), unless the authorization is terminated or revoked.  Performed at [facility], [address], [place], [place] [address]   Blood culture (routine x 2)     Status: None (Preliminary result)   Collection Time: 02/01/24  1:49 AM   Specimen: BLOOD  Result Value Ref Range Status   Specimen Description BLOOD LEFT ANTECUBITAL  Final   Special Requests   Final    BOTTLES DRAWN AEROBIC AND ANAEROBIC Blood Culture adequate volume   Culture   Final    NO GROWTH 2 DAYS Performed at  [facility], [address], [place], [place] [address]    Report Status PENDING  Incomplete  Blood culture (routine x 2)     Status: None (Preliminary result)   Collection Time: 02/01/24  2:37 AM   Specimen: BLOOD RIGHT FOREARM  Result Value Ref Range Status   Specimen Description BLOOD RIGHT FOREARM  Final   Special Requests   Final    BOTTLES DRAWN AEROBIC AND ANAEROBIC Blood Culture adequate volume   Culture   Final    NO GROWTH 2 DAYS Performed at [facility], [address] [place], [place] [address]    Report Status PENDING  Incomplete     Labs: BNP (last 3 results) No results for input(s): BNP in the last 8760 hours. Basic Metabolic Panel: Recent Labs  Lab 02/01/24 0021 02/01/24 0418 02/02/24 0808 02/03/24 0545  NA 126* 127* 128* 133*  K 4.3 4.3 4.2 4.0  CL 85* 90* 95* 101  CO2 24 19* 22 23  GLUCOSE 218* 168* 129* 107*  BUN 27* 27* 9 12  CREATININE 1.33* 1.12 0.71 0.75  CALCIUM  10.5* 9.1 8.4* 8.2*  MG  --  1.8 1.8  --   PHOS  --  2.4*  --   --    Liver Function Tests: Recent Labs  Lab 02/01/24 0021 02/01/24 0418  AST 43* 34  ALT 42 36  ALKPHOS 125 101  BILITOT 0.9 0.7  PROT 8.5* 7.3  ALBUMIN  5.2* 4.6   Recent Labs  Lab 02/01/24 0021  LIPASE 19   No results for input(s): AMMONIA in the last 168 hours. CBC: Recent Labs  Lab 02/01/24 0021 02/01/24 0418 02/02/24 0808 02/03/24 0545  WBC 21.0* 14.2* 10.9* 5.8  NEUTROABS  --  11.0* 8.7*  --   HGB 16.1 13.7 11.8* 10.4*  HCT 45.4 38.5* 34.8* 30.3*  MCV 80.4 80.7 84.3 85.1  PLT 370 266 156 124*   Cardiac Enzymes: No results for input(s): CKTOTAL, CKMB, CKMBINDEX, TROPONINI in the last 168 hours. BNP: Invalid input(s): POCBNP CBG: Recent Labs  Lab 02/02/24 0716 02/02/24 1109 02/02/24 1610 02/02/24 2048 02/03/24 0740  GLUCAP 140* 150* 138* 140*  119*   D-Dimer No results for input(s): DDIMER in the last 72 hours. Hgb A1c Recent Labs    02/01/24 0021  HGBA1C 5.9*    Lipid Profile No results for input(s): CHOL, HDL, LDLCALC, TRIG, CHOLHDL, LDLDIRECT in the last 72 hours. Thyroid  function studies No results for input(s): TSH, T4TOTAL, T3FREE, THYROIDAB in the last 72 hours.  Invalid input(s): FREET3 Anemia work up No results for input(s): VITAMINB12, FOLATE, FERRITIN, TIBC, IRON, RETICCTPCT in the last 72 hours. Urinalysis    Component Value Date/Time   COLORURINE AMBER (A) 02/01/2024 0010   APPEARANCEUR CLOUDY (A) 02/01/2024 0010   LABSPEC 1.029 02/01/2024 0010   PHURINE 5.0 02/01/2024 0010   GLUCOSEU 50 (A) 02/01/2024 0010   HGBUR NEGATIVE 02/01/2024 0010   BILIRUBINUR NEGATIVE 02/01/2024 0010   KETONESUR NEGATIVE 02/01/2024 0010   PROTEINUR 100 (A) 02/01/2024 0010   UROBILINOGEN 0.2 07/04/2010 2016   NITRITE NEGATIVE 02/01/2024 0010   LEUKOCYTESUR NEGATIVE 02/01/2024 0010   Sepsis Labs Recent Labs  Lab 02/01/24 0021 02/01/24 0418 02/02/24 0808 02/03/24 0545  WBC 21.0* 14.2* 10.9* 5.8   Microbiology Recent Results (from the past 240 hours)  Resp panel by RT-PCR (RSV, Flu A&B, Covid) Anterior Nasal Swab     Status: None   Collection Time: 02/01/24 12:02 AM   Specimen: Anterior Nasal Swab  Result Value Ref Range Status   SARS Coronavirus 2 by RT PCR NEGATIVE NEGATIVE Final    Comment: (NOTE) SARS-CoV-2 target nucleic acids are NOT DETECTED.  The SARS-CoV-2 RNA is generally detectable in upper respiratory specimens during the acute phase of infection. The lowest concentration of SARS-CoV-2 viral copies this assay can detect is 138 copies/mL. A negative result does not preclude SARS-Cov-2 infection and should not be used as the sole basis for treatment or other patient management decisions. A negative result may occur with  improper specimen collection/handling, submission of specimen other than nasopharyngeal swab, presence of viral mutation(s) within the areas targeted by this assay, and  inadequate number of viral copies(<138 copies/mL). A negative result must be combined with clinical observations, patient history, and epidemiological information. The expected result is Negative.  Fact Sheet for Patients:  bloggercourse.com  Fact Sheet for Healthcare Providers:  seriousbroker.it  This test is no t yet approved or cleared by the [place]  FDA and  has been authorized for detection and/or diagnosis of SARS-CoV-2 by FDA under an Emergency Use Authorization (EUA). This EUA will remain  in effect (meaning this test can be used) for the duration of the COVID-19 declaration under Section 564(b)(1) of the Act, 21 U.S.C.section 360bbb-3(b)(1), unless the authorization is terminated  or revoked sooner.       Influenza A by PCR NEGATIVE NEGATIVE Final   Influenza B by PCR NEGATIVE NEGATIVE Final    Comment: (NOTE) The Xpert Xpress SARS-CoV-2/FLU/RSV plus assay is intended as an aid in the diagnosis of influenza from Nasopharyngeal swab specimens and should not be used as a sole basis for treatment. Nasal washings and aspirates are unacceptable for Xpert Xpress SARS-CoV-2/FLU/RSV testing.  Fact Sheet for Patients: bloggercourse.com  Fact Sheet for Healthcare Providers: seriousbroker.it  This test is not yet approved or cleared by the [place]  FDA and has been authorized for detection and/or diagnosis of SARS-CoV-2 by FDA under an Emergency Use Authorization (EUA). This EUA will remain in effect (meaning this test can be used) for the duration of the COVID-19 declaration under Section 564(b)(1) of the Act, 21 U.S.C. section 360bbb-3(b)(1), unless  the authorization is terminated or revoked.     Resp Syncytial Virus by PCR NEGATIVE NEGATIVE Final    Comment: (NOTE) Fact Sheet for Patients: bloggercourse.com  Fact Sheet for Healthcare  Providers: seriousbroker.it  This test is not yet approved or cleared by the [place]  FDA and has been authorized for detection and/or diagnosis of SARS-CoV-2 by FDA under an Emergency Use Authorization (EUA). This EUA will remain in effect (meaning this test can be used) for the duration of the COVID-19 declaration under Section 564(b)(1) of the Act, 21 U.S.C. section 360bbb-3(b)(1), unless the authorization is terminated or revoked.  Performed at [facility], [address] [place], [place] [address]   Blood culture (routine x 2)     Status: None (Preliminary result)   Collection Time: 02/01/24  1:49 AM   Specimen: BLOOD  Result Value Ref Range Status   Specimen Description BLOOD LEFT ANTECUBITAL  Final   Special Requests   Final    BOTTLES DRAWN AEROBIC AND ANAEROBIC Blood Culture adequate volume   Culture   Final    NO GROWTH 2 DAYS Performed at [facility], [address] [place], [place] [address]    Report Status PENDING  Incomplete  Blood culture (routine x 2)     Status: None (Preliminary result)   Collection Time: 02/01/24  2:37 AM   Specimen: BLOOD RIGHT FOREARM  Result Value Ref Range Status   Specimen Description BLOOD RIGHT FOREARM  Final   Special Requests   Final    BOTTLES DRAWN AEROBIC AND ANAEROBIC Blood Culture adequate volume   Culture   Final    NO GROWTH 2 DAYS Performed at [facility], [address], [place], [place] [address]    Report Status PENDING  Incomplete   Time coordinating discharge: 35 mins  SIGNED:  Afton Louder, MD  [facility] 02/03/2024, 11:30 AM How to contact the [facility] Attending or Consulting provider 7A - 7P or covering provider during after hours 7P -7A, for this patient?  Check the care team in [facility] and look for a) attending/consulting [facility] provider listed and b) the [facility] team listed Log into [email or web address] and use 's universal password to access. If you do not have the  password, please contact the hospital operator. Locate the [facility] provider you are looking for under [facility] and page to a number that you can be directly reached. If you still have difficulty reaching the provider, please page the [facility] (Director on Call) for the Hospitalists listed on amion for assistance.     [1] No Known Allergies

## 2024-02-07 LAB — CULTURE, BLOOD (ROUTINE X 2)
Culture: NO GROWTH
Culture: NO GROWTH
Special Requests: ADEQUATE
Special Requests: ADEQUATE

## 2024-02-19 ENCOUNTER — Emergency Department (HOSPITAL_COMMUNITY)
Admission: EM | Admit: 2024-02-19 | Discharge: 2024-02-20 | Disposition: A | Discharge status: Home / Self Care | Payer: MEDICAID

## 2024-02-19 ENCOUNTER — Emergency Department (HOSPITAL_COMMUNITY): Payer: MEDICAID

## 2024-02-19 ENCOUNTER — Other Ambulatory Visit: Payer: Self-pay

## 2024-02-19 ENCOUNTER — Encounter (HOSPITAL_COMMUNITY): Payer: Self-pay | Admitting: *Deleted

## 2024-02-19 DIAGNOSIS — R09A2 Foreign body sensation, throat: Secondary | ICD-10-CM | POA: Insufficient documentation

## 2024-02-19 DIAGNOSIS — R197 Diarrhea, unspecified: Secondary | ICD-10-CM | POA: Insufficient documentation

## 2024-02-19 DIAGNOSIS — R519 Headache, unspecified: Secondary | ICD-10-CM | POA: Diagnosis not present

## 2024-02-19 DIAGNOSIS — Z7984 Long term (current) use of oral hypoglycemic drugs: Secondary | ICD-10-CM | POA: Insufficient documentation

## 2024-02-19 DIAGNOSIS — Z79899 Other long term (current) drug therapy: Secondary | ICD-10-CM | POA: Insufficient documentation

## 2024-02-19 DIAGNOSIS — I10 Essential (primary) hypertension: Secondary | ICD-10-CM | POA: Insufficient documentation

## 2024-02-19 DIAGNOSIS — R1110 Vomiting, unspecified: Secondary | ICD-10-CM | POA: Diagnosis not present

## 2024-02-19 DIAGNOSIS — Z7951 Long term (current) use of inhaled steroids: Secondary | ICD-10-CM | POA: Insufficient documentation

## 2024-02-19 DIAGNOSIS — J45909 Unspecified asthma, uncomplicated: Secondary | ICD-10-CM | POA: Diagnosis not present

## 2024-02-19 LAB — COMPREHENSIVE METABOLIC PANEL WITH GFR
ALT: 48 U/L — ABNORMAL HIGH (ref 0–44)
AST: 39 U/L (ref 15–41)
Albumin: 4.9 g/dL (ref 3.5–5.0)
Alkaline Phosphatase: 130 U/L — ABNORMAL HIGH (ref 38–126)
Anion gap: 22 — ABNORMAL HIGH (ref 5–15)
BUN: 13 mg/dL (ref 6–20)
CO2: 21 mmol/L — ABNORMAL LOW (ref 22–32)
Calcium: 10.2 mg/dL (ref 8.9–10.3)
Chloride: 95 mmol/L — ABNORMAL LOW (ref 98–111)
Creatinine, Ser: 0.95 mg/dL (ref 0.61–1.24)
GFR, Estimated: >60 mL/min
Glucose, Bld: 146 mg/dL — ABNORMAL HIGH (ref 70–99)
Potassium: 4.9 mmol/L (ref 3.5–5.1)
Sodium: 137 mmol/L (ref 135–145)
Total Bilirubin: 0.6 mg/dL (ref 0.0–1.2)
Total Protein: 8.1 g/dL (ref 6.5–8.1)

## 2024-02-19 LAB — LIPASE, BLOOD: Lipase: 16 U/L (ref 11–51)

## 2024-02-19 LAB — URINALYSIS, ROUTINE W REFLEX MICROSCOPIC
Bacteria, UA: NONE SEEN
Bilirubin Urine: NEGATIVE
Glucose, UA: NEGATIVE mg/dL
Hgb urine dipstick: NEGATIVE
Ketones, ur: NEGATIVE mg/dL
Leukocytes,Ua: NEGATIVE
Nitrite: NEGATIVE
Protein, ur: 100 mg/dL — AB
Specific Gravity, Urine: 1.026 (ref 1.005–1.030)
pH: 6 (ref 5.0–8.0)

## 2024-02-19 LAB — CBC
HCT: 46.8 % (ref 39.0–52.0)
Hemoglobin: 15.6 g/dL (ref 13.0–17.0)
MCH: 28.3 pg (ref 26.0–34.0)
MCHC: 33.3 g/dL (ref 30.0–36.0)
MCV: 84.9 fL (ref 80.0–100.0)
Platelets: 448 K/uL — ABNORMAL HIGH (ref 150–400)
RBC: 5.51 MIL/uL (ref 4.22–5.81)
RDW: 15.6 % — ABNORMAL HIGH (ref 11.5–15.5)
WBC: 10.4 K/uL (ref 4.0–10.5)
nRBC: 0 % (ref 0.0–0.2)

## 2024-02-19 MED ORDER — ALUM & MAG HYDROXIDE-SIMETH 200-200-20 MG/5ML PO SUSP
30.0000 mL | Freq: Once | ORAL | Status: AC
  Administered 2024-02-20: 30 mL via ORAL
  Filled 2024-02-19: qty 30

## 2024-02-19 MED ORDER — ONDANSETRON HCL 4 MG/2ML IJ SOLN
4.0000 mg | Freq: Once | INTRAMUSCULAR | Status: AC
  Administered 2024-02-19: 4 mg via INTRAVENOUS
  Filled 2024-02-19: qty 2

## 2024-02-19 MED ORDER — IOHEXOL 300 MG/ML  SOLN
75.0000 mL | Freq: Once | INTRAMUSCULAR | Status: AC | PRN
  Administered 2024-02-19: 75 mL via INTRAVENOUS

## 2024-02-19 MED ORDER — PANTOPRAZOLE SODIUM 40 MG PO TBEC
40.0000 mg | DELAYED_RELEASE_TABLET | Freq: Once | ORAL | Status: DC
  Filled 2024-02-19: qty 1

## 2024-02-19 MED ORDER — SODIUM CHLORIDE 0.9 % IV BOLUS
1000.0000 mL | Freq: Once | INTRAVENOUS | Status: AC
  Administered 2024-02-19: 1000 mL via INTRAVENOUS

## 2024-02-19 NOTE — ED Provider Notes (Incomplete)
 " [place] EMERGENCY DEPARTMENT AT [facility] Provider Note   CSN: [id number] Arrival date & time: 02/19/24  1603     Patient presents with: Emesis   Herbert Walsh is a 34 y.o. male with a history including hypertension, GERD, asthma, rheumatoid arthritis getting methotrexate infusions presenting for evaluation of a globus sensation in his left throat.  He describes having a slow progression of worsening full sensation in his throat over the past several months which has become worse over the past week.  He is having difficulty swallowing solid foods but is able to consume liquids without difficulty as long as he is taking small sips and is careful how he swallows.  He is vomiting liquids but denies nausea.  Also denies shortness of breath, cough, wheezing.    He denies history of food impaction, he denies chest pain, shortness of breath.  He does take omeprazole daily for acid reflux.  He also reports having an episode of diarrhea yesterday evening x 1, resolved.  He denies abdominal pain, but describes increased acid reflux.  He has not taken his omeprazole in several days.   {Add pertinent medical, surgical, social history, OB history to YEP:67052} The history is provided by the patient.       Prior to Admission medications  Medication Sig Start Date End Date Taking? Authorizing Provider  allopurinol  (ZYLOPRIM ) 300 MG tablet Take 300 mg by mouth daily. 10/08/23   [provider]  atorvastatin  (LIPITOR) 10 MG tablet Take 10 mg by mouth daily.    [provider]  azelastine  (ASTELIN ) 0.1 % nasal spray Place 2 sprays into both nostrils daily. Use in each nostril as directed 07/05/23   Tobie Eldora NOVAK, MD  bisoprolol  (ZEBETA ) 5 MG tablet Take 1 tablet (5 mg total) by mouth daily. 02/04/24   Johnson, Clanford L, MD  chlorhexidine  (PERIDEX ) 0.12 % solution 2 (two) times daily. 09/24/23   [provider]  Diclofenac  Sodium 3 % GEL Apply 1 Application  topically 2 (two) times daily. 12/13/23   [provider]  Ergocalciferol (VITAMIN D2 PO) Take 1 capsule by mouth daily.    [provider]  FLUoxetine  (PROZAC ) 20 MG capsule Take 1 capsule (20 mg total) by mouth daily. 05/31/23   Evelena Figures, MD  fluticasone  (FLONASE ) 50 MCG/ACT nasal spray Place 2 sprays into both nostrils daily. 07/05/23   Tobie Eldora NOVAK, MD  folic acid  (FOLVITE ) 1 MG tablet Take 1 mg by mouth daily. 01/03/24   [provider]  glipiZIDE -metformin  (METAGLIP) 5-500 MG tablet Take 1 tablet by mouth daily.    [provider]  HUMIRA, 2 PEN, 40 MG/0.4ML pen Inject into the skin. 10/25/23   [provider]  hydrOXYzine  (ATARAX ) 25 MG tablet Take 1 tablet (25 mg total) by mouth 3 (three) times daily as needed for anxiety. 05/30/23   Evelena Figures, MD  methotrexate (RHEUMATREX) 2.5 MG tablet Take 6 tablets by mouth once a week. 02/07/22   [provider]  naproxen  (EC NAPROSYN ) 500 MG EC tablet Take 500 mg by mouth 2 (two) times daily with a meal.    [provider]  omeprazole (PRILOSEC) 40 MG capsule Take 1 capsule by mouth every morning. 05/25/20   [provider]  OZEMPIC, 2 MG/DOSE, 8 MG/3ML SOPN Inject 2 mg into the skin once a week. 01/06/24   [provider]  RESTASIS  0.05 % ophthalmic emulsion Place 1 drop into both eyes 2 (two) times daily.  12/07/23   [provider]  traZODone  (DESYREL ) 50 MG tablet Take 1 tablet (50 mg total) by mouth at bedtime as needed for sleep. 05/30/23   Evelena Figures, MD  TYRVAYA 0.03 MG/ACT SOLN Place 1 spray into both nostrils 2 (two) times daily. 12/19/23   [provider]    Allergies: Patient has no known allergies.    Review of Systems  Constitutional:  Negative for fever.  HENT:  Negative for congestion and sore throat.   Eyes: Negative.   Respiratory:  Negative for chest tightness and shortness of breath.   Cardiovascular:  Negative for chest  pain.  Gastrointestinal:  Negative for abdominal pain and nausea.  Genitourinary: Negative.   Musculoskeletal:  Negative for arthralgias, joint swelling and neck pain.  Skin: Negative.  Negative for rash and wound.  Neurological:  Negative for dizziness, weakness, light-headedness, numbness and headaches.  Psychiatric/Behavioral: Negative.      Updated Vital Signs BP (!) 144/78   Pulse (!) 124   Temp 98.4 F (36.9 C) (Oral)   Resp 17   Ht 5' 11 (1.803 m)   Wt 122.5 kg   SpO2 100%   BMI 37.66 kg/m   Physical Exam  (all labs ordered are listed, but only abnormal results are displayed) Labs Reviewed  COMPREHENSIVE METABOLIC PANEL WITH GFR - Abnormal; Notable for the following components:      Result Value   Chloride 95 (*)    CO2 21 (*)    Glucose, Bld 146 (*)    ALT 48 (*)    Alkaline Phosphatase 130 (*)    Anion gap 22 (*)    All other components within normal limits  CBC - Abnormal; Notable for the following components:   RDW 15.6 (*)    Platelets 448 (*)    All other components within normal limits  URINALYSIS, ROUTINE W REFLEX MICROSCOPIC - Abnormal; Notable for the following components:   Color, Urine AMBER (*)    APPearance HAZY (*)    Protein, ur 100 (*)    All other components within normal limits  LIPASE, BLOOD    EKG: None  Radiology: CT Soft Tissue Neck W Contrast Result Date: 02/19/2024 EXAM: CT NECK WITH CONTRAST 02/19/2024 10:46:21 PM TECHNIQUE: CT of the neck was performed with the administration of intravenous contrast. Multiplanar reformatted images are provided for review. Automated exposure control, iterative reconstruction, and/or weight based adjustment of the mA/kV was utilized to reduce the radiation dose to as low as reasonably achievable. COMPARISON: None available. CLINICAL HISTORY: Epiglottitis or tonsillitis suspected FINDINGS: AERODIGESTIVE TRACT: Asymmetric opacification of the left vallecula. No discrete mass visualized. No edema.  SALIVARY GLANDS: The parotid and submandibular glands are unremarkable. THYROID : Unremarkable. LYMPH NODES: No suspicious cervical lymphadenopathy. SOFT TISSUES: No mass or fluid collection. BONES: No abnormality. OTHER: Small and opacified left maxillary sinus. Visualized lungs are clear. IMPRESSION: 1. Asymmetric opacification of the left vallecula. This could be due to secretions or positional, but recommend correlation with direct inspection to exclude malignancy. 2. Otherwise, unremarkable CT neck. Electronically signed by: Gilmore Molt 02/19/2024 11:05 PM EST RP Workstation: [id number]   CT Head Wo Contrast Result Date: 02/19/2024 CLINICAL DATA:  Headache emesis EXAM: CT HEAD WITHOUT CONTRAST TECHNIQUE: Contiguous axial images were obtained from the base of the skull through the vertex without intravenous contrast. RADIATION DOSE REDUCTION: This exam was performed according to the departmental dose-optimization program which includes automated exposure control, adjustment of the mA and/or kV according to patient  size and/or use of iterative reconstruction technique. COMPARISON:  CT brain 05/26/2023 FINDINGS: Brain: No evidence of acute infarction, hemorrhage, hydrocephalus, extra-axial collection or mass lesion/mass effect. Vascular: No hyperdense vessel or unexpected calcification. Skull: Normal. Negative for fracture or focal lesion. Sinuses/Orbits: Mild mucosal thickening in the sinuses Other: Is none IMPRESSION: Negative non contrasted CT appearance of the brain. Electronically Signed   By: Luke Bun M.D.   On: 02/19/2024 22:51    {Document cardiac monitor, telemetry assessment procedure when appropriate:32947} Procedures   Medications Ordered in the ED  alum & mag hydroxide-simeth (MAALOX/MYLANTA) 200-200-20 MG/5ML suspension 30 mL (has no administration in time range)  pantoprazole  (PROTONIX ) EC tablet 40 mg (has no administration in time range)  sodium chloride  0.9 % bolus 1,000 mL (0  mLs Intravenous Stopped 02/19/24 2322)  iohexol  (OMNIPAQUE ) 300 MG/ML solution 75 mL (75 mLs Intravenous Contrast Given 02/19/24 2235)  ondansetron  (ZOFRAN ) injection 4 mg (4 mg Intravenous Given 02/19/24 2226)      {Click here for ABCD2, HEART and other calculators REFRESH Note before signing:1}                              Medical Decision Making Amount and/or Complexity of Data Reviewed Labs: ordered. Radiology: ordered.  Risk OTC drugs. Prescription drug management.   ***  {Document critical care time when appropriate  Document review of labs and clinical decision tools ie CHADS2VASC2, etc  Document your independent review of radiology images and any outside records  Document your discussion with family members, caretakers and with consultants  Document social determinants of health affecting pt's care  Document your decision making why or why not admission, treatments were needed:32947:::1}   Final diagnoses:  None    ED Discharge Orders     None        "

## 2024-02-19 NOTE — ED Provider Notes (Signed)
 "  EMERGENCY DEPARTMENT AT [facility] Provider Note   CSN: [id number] Arrival date & time: 02/19/24  1603     Patient presents with: Emesis   Herbert Walsh is a 34 y.o. male with a history including hypertension, GERD, asthma, rheumatoid arthritis getting methotrexate infusions presenting for evaluation of a globus sensation in his left throat.  He describes having a slow progression of worsening full sensation in his throat over the past several months which has become worse since yesterday.  He is having difficulty swallowing solid foods but is able to consume liquids without difficulty as long as he is taking small sips and is careful how he swallows.  He is vomiting liquids but denies nausea, also no shortness of breath, cough, wheezing.   He denies history of food impaction, he denies chest pain, shortness of breath.  He does take omeprazole daily for acid reflux.  He also reports having an episode of diarrhea yesterday evening x 1, resolved.  He denies abdominal pain, but describes increased acid reflux.  He has not taken his omeprazole in several days.    The history is provided by the patient.       Prior to Admission medications  Medication Sig Start Date End Date Taking? Authorizing Provider  ondansetron  (ZOFRAN ) 4 MG tablet Take 1 tablet (4 mg total) by mouth every 6 (six) hours. 02/20/24  Yes Elliott Lasecki, PA-C  allopurinol  (ZYLOPRIM ) 300 MG tablet Take 300 mg by mouth daily. 10/08/23   [provider]  atorvastatin  (LIPITOR) 10 MG tablet Take 10 mg by mouth daily.    [provider]  azelastine  (ASTELIN ) 0.1 % nasal spray Place 2 sprays into both nostrils daily. Use in each nostril as directed 07/05/23   Tobie Eldora NOVAK, MD  bisoprolol  (ZEBETA ) 5 MG tablet Take 1 tablet (5 mg total) by mouth daily. 02/04/24   Johnson, Clanford L, MD  chlorhexidine  (PERIDEX ) 0.12 % solution 2 (two) times daily. 09/24/23   [provider]  Diclofenac   Sodium 3 % GEL Apply 1 Application topically 2 (two) times daily. 12/13/23   [provider]  Ergocalciferol (VITAMIN D2 PO) Take 1 capsule by mouth daily.    [provider]  FLUoxetine  (PROZAC ) 20 MG capsule Take 1 capsule (20 mg total) by mouth daily. 05/31/23   Evelena Figures, MD  fluticasone  (FLONASE ) 50 MCG/ACT nasal spray Place 2 sprays into both nostrils daily. 07/05/23   Tobie Eldora NOVAK, MD  folic acid  (FOLVITE ) 1 MG tablet Take 1 mg by mouth daily. 01/03/24   [provider]  glipiZIDE -metformin  (METAGLIP) 5-500 MG tablet Take 1 tablet by mouth daily.    [provider]  HUMIRA, 2 PEN, 40 MG/0.4ML pen Inject into the skin. 10/25/23   [provider]  hydrOXYzine  (ATARAX ) 25 MG tablet Take 1 tablet (25 mg total) by mouth 3 (three) times daily as needed for anxiety. 05/30/23   Evelena Figures, MD  methotrexate (RHEUMATREX) 2.5 MG tablet Take 6 tablets by mouth once a week. 02/07/22   [provider]  naproxen  (EC NAPROSYN ) 500 MG EC tablet Take 500 mg by mouth 2 (two) times daily with a meal.    [provider]  omeprazole (PRILOSEC) 40 MG capsule Take 1 capsule by mouth every morning. 05/25/20   [provider]  OZEMPIC, 2 MG/DOSE, 8 MG/3ML SOPN Inject 2 mg into the skin once a week. 01/06/24   [provider]  RESTASIS  0.05 % ophthalmic  emulsion Place 1 drop into both eyes 2 (two) times daily. 12/07/23   [provider]  traZODone  (DESYREL ) 50 MG tablet Take 1 tablet (50 mg total) by mouth at bedtime as needed for sleep. 05/30/23   Evelena Figures, MD  TYRVAYA 0.03 MG/ACT SOLN Place 1 spray into both nostrils 2 (two) times daily. 12/19/23   [provider]    Allergies: Patient has no known allergies.    Review of Systems  Constitutional:  Negative for chills and fever.  HENT:  Positive for trouble swallowing. Negative for congestion.   Eyes: Negative.   Respiratory:  Negative for chest tightness  and shortness of breath.   Cardiovascular:  Negative for chest pain.  Gastrointestinal:  Positive for vomiting. Negative for abdominal pain and nausea.  Genitourinary: Negative.   Musculoskeletal:  Negative for arthralgias, joint swelling and neck pain.  Skin: Negative.  Negative for rash and wound.  Neurological:  Negative for dizziness, weakness, light-headedness, numbness and headaches.  Psychiatric/Behavioral: Negative.      Updated Vital Signs BP (!) 157/100 (BP Location: Right Arm)   Pulse (!) 115   Temp 98.6 F (37 C) (Oral)   Resp 18   Ht 5' 11 (1.803 m)   Wt 122.5 kg   SpO2 99%   BMI 37.66 kg/m   Physical Exam Vitals and nursing note reviewed.  Constitutional:      Appearance: He is well-developed.  HENT:     Head: Normocephalic and atraumatic.     Mouth/Throat:     Mouth: Mucous membranes are moist.     Pharynx: No oropharyngeal exudate or posterior oropharyngeal erythema.  Eyes:     Conjunctiva/sclera: Conjunctivae normal.  Neck:     Vascular: No carotid bruit.  Cardiovascular:     Rate and Rhythm: Normal rate and regular rhythm.     Heart sounds: Normal heart sounds.  Pulmonary:     Effort: Pulmonary effort is normal. No respiratory distress.     Breath sounds: Normal breath sounds. No stridor. No wheezing or rhonchi.  Abdominal:     General: Bowel sounds are normal.     Palpations: Abdomen is soft.     Tenderness: There is no abdominal tenderness. There is no guarding or rebound.  Musculoskeletal:        General: Normal range of motion.     Cervical back: Normal range of motion.  Lymphadenopathy:     Cervical: No cervical adenopathy.  Skin:    General: Skin is warm and dry.  Neurological:     General: No focal deficit present.     Mental Status: He is alert.     (all labs ordered are listed, but only abnormal results are displayed) Labs Reviewed  COMPREHENSIVE METABOLIC PANEL WITH GFR - Abnormal; Notable for the following components:       Result Value   Chloride 95 (*)    CO2 21 (*)    Glucose, Bld 146 (*)    ALT 48 (*)    Alkaline Phosphatase 130 (*)    Anion gap 22 (*)    All other components within normal limits  CBC - Abnormal; Notable for the following components:   RDW 15.6 (*)    Platelets 448 (*)    All other components within normal limits  URINALYSIS, ROUTINE W REFLEX MICROSCOPIC - Abnormal; Notable for the following components:   Color, Urine AMBER (*)    APPearance HAZY (*)    Protein, ur 100 (*)  All other components within normal limits  LIPASE, BLOOD    EKG: None  Radiology: CT Soft Tissue Neck W Contrast Result Date: 02/19/2024 EXAM: CT NECK WITH CONTRAST 02/19/2024 10:46:21 PM TECHNIQUE: CT of the neck was performed with the administration of intravenous contrast. Multiplanar reformatted images are provided for review. Automated exposure control, iterative reconstruction, and/or weight based adjustment of the mA/kV was utilized to reduce the radiation dose to as low as reasonably achievable. COMPARISON: None available. CLINICAL HISTORY: Epiglottitis or tonsillitis suspected FINDINGS: AERODIGESTIVE TRACT: Asymmetric opacification of the left vallecula. No discrete mass visualized. No edema. SALIVARY GLANDS: The parotid and submandibular glands are unremarkable. THYROID : Unremarkable. LYMPH NODES: No suspicious cervical lymphadenopathy. SOFT TISSUES: No mass or fluid collection. BONES: No abnormality. OTHER: Small and opacified left maxillary sinus. Visualized lungs are clear. IMPRESSION: 1. Asymmetric opacification of the left vallecula. This could be due to secretions or positional, but recommend correlation with direct inspection to exclude malignancy. 2. Otherwise, unremarkable CT neck. Electronically signed by: Gilmore Molt 02/19/2024 11:05 PM EST RP Workstation: [id number]   CT Head Wo Contrast Result Date: 02/19/2024 CLINICAL DATA:  Headache emesis EXAM: CT HEAD WITHOUT CONTRAST TECHNIQUE:  Contiguous axial images were obtained from the base of the skull through the vertex without intravenous contrast. RADIATION DOSE REDUCTION: This exam was performed according to the departmental dose-optimization program which includes automated exposure control, adjustment of the mA and/or kV according to patient size and/or use of iterative reconstruction technique. COMPARISON:  CT brain 05/26/2023 FINDINGS: Brain: No evidence of acute infarction, hemorrhage, hydrocephalus, extra-axial collection or mass lesion/mass effect. Vascular: No hyperdense vessel or unexpected calcification. Skull: Normal. Negative for fracture or focal lesion. Sinuses/Orbits: Mild mucosal thickening in the sinuses Other: Is none IMPRESSION: Negative non contrasted CT appearance of the brain. Electronically Signed   By: Luke Bun M.D.   On: 02/19/2024 22:51     Procedures   Medications Ordered in the ED  pantoprazole  (PROTONIX ) EC tablet 40 mg (40 mg Oral Not Given 02/20/24 0013)  sodium chloride  0.9 % bolus 1,000 mL (0 mLs Intravenous Stopped 02/19/24 2322)  iohexol  (OMNIPAQUE ) 300 MG/ML solution 75 mL (75 mLs Intravenous Contrast Given 02/19/24 2235)  ondansetron  (ZOFRAN ) injection 4 mg (4 mg Intravenous Given 02/19/24 2226)  alum & mag hydroxide-simeth (MAALOX/MYLANTA) 200-200-20 MG/5ML suspension 30 mL (30 mLs Oral Given 02/20/24 0013)  pantoprazole  (PROTONIX ) injection 40 mg (40 mg Intravenous Given 02/20/24 0033)                                    Medical Decision Making Patient presenting with a chronic globus sensation, worse since yesterday evening.  He is in no respiratory distress, no noted voice changes, no wheezing or stridor.  He is vomiting but denies nausea or abdominal pain.  Differential diagnosis would be foreign body in the throat, mass, abscess, epiglottitis, severe GERD.  Labs and imaging reviewed.  There is an asymmetric opacification of the left vallecula of unclear etiology, this does appear to be at the  level where his symptoms originate.  ENT follow-up is recommended.  He was able to tolerate sips of fluid here, but when attempted to give him a dose of Maalox and his missed dose of PPI, he promptly vomited.  He was offered admission, at [facility] [place] so he could undergo ENT evaluation.  Patient declined admission stating he has a court date on Thursday he cannot miss.  He was encouraged to return here or ideally return to [facility] if his symptoms worsen or he changes his mind about admission.  Amount and/or Complexity of Data Reviewed Labs: ordered.    Details: Labs reviewed, his CMET is significant for an anion gap of 22, he has a glucose of 146, his CO2 is 21, his electrolytes are otherwise normal, suspect this is a acidosis secondary to prolonged vomiting.  His CBC is reassuring with a normal WBC count at 10.4, urinalysis is negative for UTI.  LFTs and lipase are normal. Radiology: ordered.    Details: CT imaging as outlined above.  Risk OTC drugs. Prescription drug management.        Final diagnoses:  Globus sensation    ED Discharge Orders          Ordered    ondansetron  (ZOFRAN ) 4 MG tablet  Every 6 hours        02/20/24 0038               Jasn Xia, PA-C 02/20/24 0038    Kammerer, Megan L, DO 02/20/24 2012  "

## 2024-02-19 NOTE — ED Triage Notes (Signed)
 Pt with emesis and diarrhea since last night, pt thinks he may be dehydrated. Pt thinks it may be from ozempic started it 2-3 months ago.

## 2024-02-20 MED ORDER — ALUM & MAG HYDROXIDE-SIMETH 200-200-20 MG/5ML PO SUSP: 30.0000 mL | Freq: Once | ORAL | Status: DC

## 2024-02-20 MED ORDER — ONDANSETRON HCL 4 MG PO TABS: 4.0000 mg | ORAL_TABLET | Freq: Four times a day (QID) | ORAL | 0 refills | Status: AC

## 2024-02-20 MED ORDER — PANTOPRAZOLE SODIUM 40 MG IV SOLR
40.0000 mg | Freq: Once | INTRAVENOUS | Status: AC
  Administered 2024-02-20: 40 mg via INTRAVENOUS
  Filled 2024-02-20: qty 10

## 2024-02-20 NOTE — Discharge Instructions (Signed)
 Call Dr. Anthony office in the morning to arrange an office visit with him.  As discussed today CT scan suggest you may have an irregularity of one of the structures in your throat which may be causing your symptoms.  You need to have a direct laryngoscopy by Dr. Tobie.  In the interim return here or ideally go to [facility] if your symptoms worsen as Dr. Tobie would be able to see you at that hospital if you needed to be admitted.

## 2024-03-18 ENCOUNTER — Encounter (INDEPENDENT_AMBULATORY_CARE_PROVIDER_SITE_OTHER): Payer: Self-pay

## 2024-03-18 ENCOUNTER — Telehealth (INDEPENDENT_AMBULATORY_CARE_PROVIDER_SITE_OTHER): Payer: Self-pay | Admitting: Otolaryngology

## 2024-03-19 ENCOUNTER — Ambulatory Visit (INDEPENDENT_AMBULATORY_CARE_PROVIDER_SITE_OTHER): Payer: MEDICAID | Admitting: Otolaryngology
